# Patient Record
Sex: Male | Born: 1945
Health system: Southern US, Community
[De-identification: ages and names within clinical notes are randomized; demographics above are authoritative.]

## PROBLEM LIST (undated history)

## (undated) DIAGNOSIS — M545 Low back pain: Secondary | ICD-10-CM

## (undated) DIAGNOSIS — E785 Hyperlipidemia, unspecified: Secondary | ICD-10-CM

## (undated) DIAGNOSIS — I5030 Unspecified diastolic (congestive) heart failure: Secondary | ICD-10-CM

## (undated) DIAGNOSIS — G2581 Restless legs syndrome: Secondary | ICD-10-CM

## (undated) DIAGNOSIS — I4819 Other persistent atrial fibrillation: Secondary | ICD-10-CM

## (undated) DIAGNOSIS — I10 Essential (primary) hypertension: Secondary | ICD-10-CM

## (undated) DIAGNOSIS — H02839 Dermatochalasis of unspecified eye, unspecified eyelid: Secondary | ICD-10-CM

## (undated) DIAGNOSIS — E119 Type 2 diabetes mellitus without complications: Secondary | ICD-10-CM

## (undated) DIAGNOSIS — N182 Chronic kidney disease, stage 2 (mild): Secondary | ICD-10-CM

## (undated) DIAGNOSIS — M719 Bursopathy, unspecified: Secondary | ICD-10-CM

## (undated) DIAGNOSIS — G4730 Sleep apnea, unspecified: Secondary | ICD-10-CM

## (undated) DIAGNOSIS — M67919 Unspecified disorder of synovium and tendon, unspecified shoulder: Secondary | ICD-10-CM

## (undated) DIAGNOSIS — I119 Hypertensive heart disease without heart failure: Secondary | ICD-10-CM

## (undated) DIAGNOSIS — H524 Presbyopia: Secondary | ICD-10-CM

## (undated) DIAGNOSIS — M47817 Spondylosis without myelopathy or radiculopathy, lumbosacral region: Secondary | ICD-10-CM

## (undated) DIAGNOSIS — R601 Generalized edema: Secondary | ICD-10-CM

## (undated) DIAGNOSIS — J984 Other disorders of lung: Secondary | ICD-10-CM

## (undated) DIAGNOSIS — M961 Postlaminectomy syndrome, not elsewhere classified: Secondary | ICD-10-CM

## (undated) DIAGNOSIS — G5700 Lesion of sciatic nerve, unspecified lower limb: Secondary | ICD-10-CM

## (undated) DIAGNOSIS — Z7901 Long term (current) use of anticoagulants: Secondary | ICD-10-CM

## (undated) DIAGNOSIS — G4733 Obstructive sleep apnea (adult) (pediatric): Secondary | ICD-10-CM

## (undated) DIAGNOSIS — J449 Chronic obstructive pulmonary disease, unspecified: Secondary | ICD-10-CM

## (undated) HISTORY — DX: Other disorders of lung: J98.4

## (undated) HISTORY — DX: Presbyopia: H52.4

## (undated) HISTORY — DX: Postlaminectomy syndrome, not elsewhere classified: M96.1

## (undated) HISTORY — DX: Type 2 diabetes mellitus without complications: E11.9

## (undated) HISTORY — PX: BACK SURGERY: SHX140

## (undated) HISTORY — PX: LUMBAR FUSION: SHX111

## (undated) HISTORY — PX: CATARACT EXTRACTION: SUR2

## (undated) HISTORY — PX: NECK SURGERY: SHX720

## (undated) HISTORY — DX: Long term (current) use of anticoagulants: Z79.01

## (undated) HISTORY — DX: Chronic obstructive pulmonary disease, unspecified: J44.9

## (undated) HISTORY — DX: Low back pain: M54.5

## (undated) HISTORY — PX: APPENDECTOMY: SHX54

## (undated) HISTORY — DX: Essential (primary) hypertension: I10

## (undated) HISTORY — DX: Dermatochalasis of unspecified eye, unspecified eyelid: H02.839

## (undated) HISTORY — DX: Unspecified disorder of synovium and tendon, unspecified shoulder: M67.919

## (undated) HISTORY — DX: Spondylosis without myelopathy or radiculopathy, lumbosacral region: M47.817

## (undated) HISTORY — DX: Other persistent atrial fibrillation: I48.19

## (undated) HISTORY — DX: Obstructive sleep apnea (adult) (pediatric): G47.33

## (undated) HISTORY — DX: Chronic kidney disease, stage 2 (mild): N18.2

## (undated) HISTORY — DX: Sleep apnea, unspecified: G47.30

## (undated) HISTORY — DX: Generalized edema: R60.1

## (undated) HISTORY — DX: Restless legs syndrome: G25.81

## (undated) HISTORY — DX: Hyperlipidemia, unspecified: E78.5

## (undated) HISTORY — DX: Bursopathy, unspecified: M71.9

## (undated) HISTORY — DX: Hypertensive heart disease without heart failure: I11.9

## (undated) HISTORY — DX: Lesion of sciatic nerve, unspecified lower limb: G57.00

---

## 2005-07-02 ENCOUNTER — Inpatient Hospital Stay (HOSPITAL_COMMUNITY): Admission: RE | Admit: 2005-07-02 | Discharge: 2005-07-06 | Payer: Self-pay | Admitting: Orthopaedic Surgery

## 2013-05-06 DIAGNOSIS — F329 Major depressive disorder, single episode, unspecified: Secondary | ICD-10-CM | POA: Insufficient documentation

## 2013-05-06 DIAGNOSIS — I10 Essential (primary) hypertension: Secondary | ICD-10-CM | POA: Insufficient documentation

## 2013-05-06 DIAGNOSIS — F32A Depression, unspecified: Secondary | ICD-10-CM | POA: Insufficient documentation

## 2013-05-06 DIAGNOSIS — G8929 Other chronic pain: Secondary | ICD-10-CM | POA: Insufficient documentation

## 2013-05-06 DIAGNOSIS — F419 Anxiety disorder, unspecified: Secondary | ICD-10-CM

## 2013-05-06 DIAGNOSIS — M47817 Spondylosis without myelopathy or radiculopathy, lumbosacral region: Secondary | ICD-10-CM | POA: Insufficient documentation

## 2013-05-06 DIAGNOSIS — G4730 Sleep apnea, unspecified: Secondary | ICD-10-CM | POA: Insufficient documentation

## 2013-05-06 DIAGNOSIS — M961 Postlaminectomy syndrome, not elsewhere classified: Secondary | ICD-10-CM | POA: Insufficient documentation

## 2013-05-06 HISTORY — DX: Spondylosis without myelopathy or radiculopathy, lumbosacral region: M47.817

## 2013-05-06 HISTORY — DX: Essential (primary) hypertension: I10

## 2013-05-06 HISTORY — DX: Postlaminectomy syndrome, not elsewhere classified: M96.1

## 2013-05-06 HISTORY — DX: Sleep apnea, unspecified: G47.30

## 2013-05-06 HISTORY — DX: Anxiety disorder, unspecified: F41.9

## 2013-05-06 HISTORY — DX: Depression, unspecified: F32.A

## 2013-08-17 DIAGNOSIS — M545 Low back pain, unspecified: Secondary | ICD-10-CM | POA: Insufficient documentation

## 2013-08-17 HISTORY — DX: Low back pain, unspecified: M54.50

## 2014-01-05 DIAGNOSIS — M67919 Unspecified disorder of synovium and tendon, unspecified shoulder: Secondary | ICD-10-CM | POA: Insufficient documentation

## 2014-01-05 DIAGNOSIS — M719 Bursopathy, unspecified: Secondary | ICD-10-CM

## 2014-01-05 HISTORY — DX: Unspecified disorder of synovium and tendon, unspecified shoulder: M67.919

## 2014-01-05 HISTORY — DX: Bursopathy, unspecified: M71.9

## 2014-05-03 DIAGNOSIS — G5700 Lesion of sciatic nerve, unspecified lower limb: Secondary | ICD-10-CM

## 2014-05-03 HISTORY — DX: Lesion of sciatic nerve, unspecified lower limb: G57.00

## 2014-12-12 DIAGNOSIS — Z961 Presence of intraocular lens: Secondary | ICD-10-CM | POA: Diagnosis not present

## 2014-12-12 DIAGNOSIS — E119 Type 2 diabetes mellitus without complications: Secondary | ICD-10-CM | POA: Diagnosis not present

## 2014-12-12 DIAGNOSIS — H524 Presbyopia: Secondary | ICD-10-CM | POA: Diagnosis not present

## 2015-01-01 DIAGNOSIS — M961 Postlaminectomy syndrome, not elsewhere classified: Secondary | ICD-10-CM | POA: Diagnosis not present

## 2015-01-01 DIAGNOSIS — G894 Chronic pain syndrome: Secondary | ICD-10-CM | POA: Diagnosis not present

## 2015-01-01 DIAGNOSIS — M47817 Spondylosis without myelopathy or radiculopathy, lumbosacral region: Secondary | ICD-10-CM | POA: Diagnosis not present

## 2015-01-01 DIAGNOSIS — M545 Low back pain: Secondary | ICD-10-CM | POA: Diagnosis not present

## 2015-01-29 DIAGNOSIS — J441 Chronic obstructive pulmonary disease with (acute) exacerbation: Secondary | ICD-10-CM | POA: Diagnosis not present

## 2015-01-29 DIAGNOSIS — I10 Essential (primary) hypertension: Secondary | ICD-10-CM | POA: Diagnosis not present

## 2015-01-29 DIAGNOSIS — E1142 Type 2 diabetes mellitus with diabetic polyneuropathy: Secondary | ICD-10-CM | POA: Diagnosis not present

## 2015-01-29 DIAGNOSIS — E782 Mixed hyperlipidemia: Secondary | ICD-10-CM | POA: Diagnosis not present

## 2015-01-30 DIAGNOSIS — E780 Pure hypercholesterolemia: Secondary | ICD-10-CM | POA: Diagnosis not present

## 2015-01-30 DIAGNOSIS — E119 Type 2 diabetes mellitus without complications: Secondary | ICD-10-CM | POA: Diagnosis not present

## 2015-02-28 DIAGNOSIS — M961 Postlaminectomy syndrome, not elsewhere classified: Secondary | ICD-10-CM | POA: Diagnosis not present

## 2015-02-28 DIAGNOSIS — M47817 Spondylosis without myelopathy or radiculopathy, lumbosacral region: Secondary | ICD-10-CM | POA: Diagnosis not present

## 2015-02-28 DIAGNOSIS — M545 Low back pain: Secondary | ICD-10-CM | POA: Diagnosis not present

## 2015-02-28 DIAGNOSIS — G894 Chronic pain syndrome: Secondary | ICD-10-CM | POA: Diagnosis not present

## 2015-04-25 DIAGNOSIS — J0100 Acute maxillary sinusitis, unspecified: Secondary | ICD-10-CM | POA: Diagnosis not present

## 2015-05-09 DIAGNOSIS — E782 Mixed hyperlipidemia: Secondary | ICD-10-CM | POA: Diagnosis not present

## 2015-05-09 DIAGNOSIS — R202 Paresthesia of skin: Secondary | ICD-10-CM | POA: Diagnosis not present

## 2015-05-09 DIAGNOSIS — E1142 Type 2 diabetes mellitus with diabetic polyneuropathy: Secondary | ICD-10-CM | POA: Diagnosis not present

## 2015-05-09 DIAGNOSIS — E119 Type 2 diabetes mellitus without complications: Secondary | ICD-10-CM | POA: Diagnosis not present

## 2015-05-09 DIAGNOSIS — J449 Chronic obstructive pulmonary disease, unspecified: Secondary | ICD-10-CM | POA: Diagnosis not present

## 2015-05-09 DIAGNOSIS — E780 Pure hypercholesterolemia: Secondary | ICD-10-CM | POA: Diagnosis not present

## 2015-05-09 DIAGNOSIS — I10 Essential (primary) hypertension: Secondary | ICD-10-CM | POA: Diagnosis not present

## 2015-05-29 DIAGNOSIS — M47817 Spondylosis without myelopathy or radiculopathy, lumbosacral region: Secondary | ICD-10-CM | POA: Diagnosis not present

## 2015-05-29 DIAGNOSIS — M67919 Unspecified disorder of synovium and tendon, unspecified shoulder: Secondary | ICD-10-CM | POA: Diagnosis not present

## 2015-05-29 DIAGNOSIS — G894 Chronic pain syndrome: Secondary | ICD-10-CM | POA: Diagnosis not present

## 2015-05-29 DIAGNOSIS — M545 Low back pain: Secondary | ICD-10-CM | POA: Diagnosis not present

## 2015-05-29 DIAGNOSIS — M7582 Other shoulder lesions, left shoulder: Secondary | ICD-10-CM | POA: Diagnosis not present

## 2015-05-29 DIAGNOSIS — M719 Bursopathy, unspecified: Secondary | ICD-10-CM | POA: Diagnosis not present

## 2015-05-29 DIAGNOSIS — M961 Postlaminectomy syndrome, not elsewhere classified: Secondary | ICD-10-CM | POA: Diagnosis not present

## 2015-07-08 DIAGNOSIS — J449 Chronic obstructive pulmonary disease, unspecified: Secondary | ICD-10-CM | POA: Diagnosis not present

## 2015-07-08 DIAGNOSIS — L03119 Cellulitis of unspecified part of limb: Secondary | ICD-10-CM | POA: Diagnosis not present

## 2015-07-10 DIAGNOSIS — L03119 Cellulitis of unspecified part of limb: Secondary | ICD-10-CM | POA: Diagnosis not present

## 2015-07-10 DIAGNOSIS — L03114 Cellulitis of left upper limb: Secondary | ICD-10-CM | POA: Diagnosis not present

## 2015-07-11 DIAGNOSIS — L03114 Cellulitis of left upper limb: Secondary | ICD-10-CM | POA: Diagnosis not present

## 2015-07-12 DIAGNOSIS — L03114 Cellulitis of left upper limb: Secondary | ICD-10-CM | POA: Diagnosis not present

## 2015-07-13 DIAGNOSIS — L03114 Cellulitis of left upper limb: Secondary | ICD-10-CM | POA: Diagnosis not present

## 2015-07-14 DIAGNOSIS — L03114 Cellulitis of left upper limb: Secondary | ICD-10-CM | POA: Diagnosis not present

## 2015-07-15 DIAGNOSIS — L03114 Cellulitis of left upper limb: Secondary | ICD-10-CM | POA: Diagnosis not present

## 2015-07-16 DIAGNOSIS — L03114 Cellulitis of left upper limb: Secondary | ICD-10-CM | POA: Diagnosis not present

## 2015-07-22 DIAGNOSIS — J019 Acute sinusitis, unspecified: Secondary | ICD-10-CM | POA: Diagnosis not present

## 2015-07-22 DIAGNOSIS — J45909 Unspecified asthma, uncomplicated: Secondary | ICD-10-CM | POA: Diagnosis not present

## 2015-08-28 DIAGNOSIS — M719 Bursopathy, unspecified: Secondary | ICD-10-CM | POA: Diagnosis not present

## 2015-08-28 DIAGNOSIS — M47817 Spondylosis without myelopathy or radiculopathy, lumbosacral region: Secondary | ICD-10-CM | POA: Diagnosis not present

## 2015-08-28 DIAGNOSIS — G894 Chronic pain syndrome: Secondary | ICD-10-CM | POA: Diagnosis not present

## 2015-08-28 DIAGNOSIS — M961 Postlaminectomy syndrome, not elsewhere classified: Secondary | ICD-10-CM | POA: Diagnosis not present

## 2015-08-29 DIAGNOSIS — J449 Chronic obstructive pulmonary disease, unspecified: Secondary | ICD-10-CM | POA: Diagnosis not present

## 2015-08-29 DIAGNOSIS — Z6841 Body Mass Index (BMI) 40.0 and over, adult: Secondary | ICD-10-CM | POA: Diagnosis not present

## 2015-08-29 DIAGNOSIS — E1142 Type 2 diabetes mellitus with diabetic polyneuropathy: Secondary | ICD-10-CM | POA: Diagnosis not present

## 2015-08-29 DIAGNOSIS — J069 Acute upper respiratory infection, unspecified: Secondary | ICD-10-CM | POA: Diagnosis not present

## 2015-08-29 DIAGNOSIS — I10 Essential (primary) hypertension: Secondary | ICD-10-CM | POA: Diagnosis not present

## 2015-08-29 DIAGNOSIS — Z23 Encounter for immunization: Secondary | ICD-10-CM | POA: Diagnosis not present

## 2015-08-29 DIAGNOSIS — E782 Mixed hyperlipidemia: Secondary | ICD-10-CM | POA: Diagnosis not present

## 2015-09-20 DIAGNOSIS — H6002 Abscess of left external ear: Secondary | ICD-10-CM | POA: Diagnosis not present

## 2015-10-01 DIAGNOSIS — R944 Abnormal results of kidney function studies: Secondary | ICD-10-CM | POA: Diagnosis not present

## 2015-10-09 DIAGNOSIS — I10 Essential (primary) hypertension: Secondary | ICD-10-CM | POA: Diagnosis not present

## 2015-10-09 DIAGNOSIS — R5383 Other fatigue: Secondary | ICD-10-CM | POA: Diagnosis not present

## 2015-10-10 DIAGNOSIS — J449 Chronic obstructive pulmonary disease, unspecified: Secondary | ICD-10-CM | POA: Diagnosis not present

## 2015-10-10 DIAGNOSIS — R0602 Shortness of breath: Secondary | ICD-10-CM | POA: Diagnosis not present

## 2015-10-10 DIAGNOSIS — J9811 Atelectasis: Secondary | ICD-10-CM | POA: Diagnosis not present

## 2015-10-23 DIAGNOSIS — R944 Abnormal results of kidney function studies: Secondary | ICD-10-CM | POA: Diagnosis not present

## 2015-10-28 DIAGNOSIS — R600 Localized edema: Secondary | ICD-10-CM | POA: Diagnosis not present

## 2015-10-28 DIAGNOSIS — I351 Nonrheumatic aortic (valve) insufficiency: Secondary | ICD-10-CM | POA: Diagnosis not present

## 2015-10-28 DIAGNOSIS — I517 Cardiomegaly: Secondary | ICD-10-CM | POA: Diagnosis not present

## 2015-10-30 DIAGNOSIS — I10 Essential (primary) hypertension: Secondary | ICD-10-CM | POA: Diagnosis not present

## 2015-10-30 DIAGNOSIS — J060 Acute laryngopharyngitis: Secondary | ICD-10-CM | POA: Diagnosis not present

## 2015-10-30 DIAGNOSIS — R600 Localized edema: Secondary | ICD-10-CM | POA: Diagnosis not present

## 2015-11-04 DIAGNOSIS — R609 Edema, unspecified: Secondary | ICD-10-CM | POA: Insufficient documentation

## 2015-11-04 HISTORY — DX: Edema, unspecified: R60.9

## 2015-11-05 DIAGNOSIS — J449 Chronic obstructive pulmonary disease, unspecified: Secondary | ICD-10-CM | POA: Insufficient documentation

## 2015-11-05 DIAGNOSIS — J441 Chronic obstructive pulmonary disease with (acute) exacerbation: Secondary | ICD-10-CM | POA: Insufficient documentation

## 2015-11-05 DIAGNOSIS — G4730 Sleep apnea, unspecified: Secondary | ICD-10-CM | POA: Diagnosis not present

## 2015-11-05 DIAGNOSIS — E119 Type 2 diabetes mellitus without complications: Secondary | ICD-10-CM | POA: Diagnosis not present

## 2015-11-05 DIAGNOSIS — E118 Type 2 diabetes mellitus with unspecified complications: Secondary | ICD-10-CM

## 2015-11-05 DIAGNOSIS — R0609 Other forms of dyspnea: Secondary | ICD-10-CM | POA: Diagnosis not present

## 2015-11-05 DIAGNOSIS — R609 Edema, unspecified: Secondary | ICD-10-CM | POA: Diagnosis not present

## 2015-11-05 DIAGNOSIS — I10 Essential (primary) hypertension: Secondary | ICD-10-CM | POA: Diagnosis not present

## 2015-11-05 HISTORY — DX: Chronic obstructive pulmonary disease, unspecified: J44.9

## 2015-11-05 HISTORY — DX: Type 2 diabetes mellitus without complications: E11.9

## 2015-11-05 HISTORY — DX: Type 2 diabetes mellitus with unspecified complications: E11.8

## 2015-11-27 DIAGNOSIS — R609 Edema, unspecified: Secondary | ICD-10-CM | POA: Diagnosis not present

## 2015-11-28 DIAGNOSIS — M961 Postlaminectomy syndrome, not elsewhere classified: Secondary | ICD-10-CM | POA: Diagnosis not present

## 2015-11-28 DIAGNOSIS — M47817 Spondylosis without myelopathy or radiculopathy, lumbosacral region: Secondary | ICD-10-CM | POA: Diagnosis not present

## 2015-11-28 DIAGNOSIS — G894 Chronic pain syndrome: Secondary | ICD-10-CM | POA: Diagnosis not present

## 2015-12-04 DIAGNOSIS — D649 Anemia, unspecified: Secondary | ICD-10-CM | POA: Diagnosis not present

## 2015-12-04 DIAGNOSIS — J0100 Acute maxillary sinusitis, unspecified: Secondary | ICD-10-CM | POA: Diagnosis not present

## 2015-12-04 DIAGNOSIS — E782 Mixed hyperlipidemia: Secondary | ICD-10-CM | POA: Diagnosis not present

## 2015-12-04 DIAGNOSIS — J209 Acute bronchitis, unspecified: Secondary | ICD-10-CM | POA: Diagnosis not present

## 2015-12-04 DIAGNOSIS — E1142 Type 2 diabetes mellitus with diabetic polyneuropathy: Secondary | ICD-10-CM | POA: Diagnosis not present

## 2015-12-04 DIAGNOSIS — I10 Essential (primary) hypertension: Secondary | ICD-10-CM | POA: Diagnosis not present

## 2015-12-04 DIAGNOSIS — Z6841 Body Mass Index (BMI) 40.0 and over, adult: Secondary | ICD-10-CM | POA: Diagnosis not present

## 2015-12-04 DIAGNOSIS — D500 Iron deficiency anemia secondary to blood loss (chronic): Secondary | ICD-10-CM | POA: Diagnosis not present

## 2015-12-04 DIAGNOSIS — J410 Simple chronic bronchitis: Secondary | ICD-10-CM | POA: Diagnosis not present

## 2015-12-16 DIAGNOSIS — E785 Hyperlipidemia, unspecified: Secondary | ICD-10-CM | POA: Insufficient documentation

## 2015-12-16 DIAGNOSIS — Z961 Presence of intraocular lens: Secondary | ICD-10-CM

## 2015-12-16 DIAGNOSIS — H524 Presbyopia: Secondary | ICD-10-CM

## 2015-12-16 DIAGNOSIS — H02839 Dermatochalasis of unspecified eye, unspecified eyelid: Secondary | ICD-10-CM | POA: Insufficient documentation

## 2015-12-16 HISTORY — DX: Presence of intraocular lens: Z96.1

## 2015-12-16 HISTORY — DX: Dermatochalasis of unspecified eye, unspecified eyelid: H02.839

## 2015-12-16 HISTORY — DX: Hyperlipidemia, unspecified: E78.5

## 2015-12-16 HISTORY — DX: Presbyopia: H52.4

## 2015-12-19 DIAGNOSIS — G4733 Obstructive sleep apnea (adult) (pediatric): Secondary | ICD-10-CM | POA: Diagnosis not present

## 2015-12-19 DIAGNOSIS — J45909 Unspecified asthma, uncomplicated: Secondary | ICD-10-CM | POA: Diagnosis not present

## 2015-12-19 DIAGNOSIS — R601 Generalized edema: Secondary | ICD-10-CM | POA: Diagnosis not present

## 2015-12-19 DIAGNOSIS — J449 Chronic obstructive pulmonary disease, unspecified: Secondary | ICD-10-CM | POA: Diagnosis not present

## 2015-12-27 DIAGNOSIS — J018 Other acute sinusitis: Secondary | ICD-10-CM | POA: Diagnosis not present

## 2015-12-27 DIAGNOSIS — I10 Essential (primary) hypertension: Secondary | ICD-10-CM | POA: Diagnosis not present

## 2015-12-27 DIAGNOSIS — J410 Simple chronic bronchitis: Secondary | ICD-10-CM | POA: Diagnosis not present

## 2016-01-08 DIAGNOSIS — G4761 Periodic limb movement disorder: Secondary | ICD-10-CM | POA: Diagnosis not present

## 2016-01-08 DIAGNOSIS — R0683 Snoring: Secondary | ICD-10-CM | POA: Diagnosis not present

## 2016-01-08 DIAGNOSIS — G4733 Obstructive sleep apnea (adult) (pediatric): Secondary | ICD-10-CM | POA: Diagnosis not present

## 2016-01-20 DIAGNOSIS — G4733 Obstructive sleep apnea (adult) (pediatric): Secondary | ICD-10-CM | POA: Diagnosis not present

## 2016-01-20 DIAGNOSIS — J449 Chronic obstructive pulmonary disease, unspecified: Secondary | ICD-10-CM | POA: Diagnosis not present

## 2016-01-20 DIAGNOSIS — J984 Other disorders of lung: Secondary | ICD-10-CM | POA: Diagnosis not present

## 2016-01-21 DIAGNOSIS — E611 Iron deficiency: Secondary | ICD-10-CM | POA: Diagnosis not present

## 2016-01-21 DIAGNOSIS — Z86010 Personal history of colonic polyps: Secondary | ICD-10-CM | POA: Diagnosis not present

## 2016-01-29 DIAGNOSIS — I10 Essential (primary) hypertension: Secondary | ICD-10-CM | POA: Diagnosis not present

## 2016-01-29 DIAGNOSIS — J060 Acute laryngopharyngitis: Secondary | ICD-10-CM | POA: Diagnosis not present

## 2016-01-31 DIAGNOSIS — G4733 Obstructive sleep apnea (adult) (pediatric): Secondary | ICD-10-CM | POA: Diagnosis not present

## 2016-02-14 DIAGNOSIS — Z86010 Personal history of colonic polyps: Secondary | ICD-10-CM | POA: Diagnosis not present

## 2016-02-14 DIAGNOSIS — Z1211 Encounter for screening for malignant neoplasm of colon: Secondary | ICD-10-CM | POA: Diagnosis not present

## 2016-02-14 DIAGNOSIS — Z09 Encounter for follow-up examination after completed treatment for conditions other than malignant neoplasm: Secondary | ICD-10-CM | POA: Diagnosis not present

## 2016-02-14 DIAGNOSIS — D509 Iron deficiency anemia, unspecified: Secondary | ICD-10-CM | POA: Diagnosis not present

## 2016-02-14 DIAGNOSIS — K641 Second degree hemorrhoids: Secondary | ICD-10-CM | POA: Diagnosis not present

## 2016-02-14 DIAGNOSIS — D124 Benign neoplasm of descending colon: Secondary | ICD-10-CM | POA: Diagnosis not present

## 2016-02-14 DIAGNOSIS — K5730 Diverticulosis of large intestine without perforation or abscess without bleeding: Secondary | ICD-10-CM | POA: Diagnosis not present

## 2016-02-14 DIAGNOSIS — E611 Iron deficiency: Secondary | ICD-10-CM | POA: Diagnosis not present

## 2016-02-20 DIAGNOSIS — G894 Chronic pain syndrome: Secondary | ICD-10-CM | POA: Diagnosis not present

## 2016-02-20 DIAGNOSIS — M67919 Unspecified disorder of synovium and tendon, unspecified shoulder: Secondary | ICD-10-CM | POA: Diagnosis not present

## 2016-02-20 DIAGNOSIS — M719 Bursopathy, unspecified: Secondary | ICD-10-CM | POA: Diagnosis not present

## 2016-02-20 DIAGNOSIS — M47817 Spondylosis without myelopathy or radiculopathy, lumbosacral region: Secondary | ICD-10-CM | POA: Diagnosis not present

## 2016-02-20 DIAGNOSIS — M961 Postlaminectomy syndrome, not elsewhere classified: Secondary | ICD-10-CM | POA: Diagnosis not present

## 2016-03-02 DIAGNOSIS — G4733 Obstructive sleep apnea (adult) (pediatric): Secondary | ICD-10-CM | POA: Diagnosis not present

## 2016-03-04 DIAGNOSIS — G4733 Obstructive sleep apnea (adult) (pediatric): Secondary | ICD-10-CM | POA: Diagnosis not present

## 2016-03-04 DIAGNOSIS — I10 Essential (primary) hypertension: Secondary | ICD-10-CM | POA: Diagnosis not present

## 2016-03-04 DIAGNOSIS — Z6841 Body Mass Index (BMI) 40.0 and over, adult: Secondary | ICD-10-CM | POA: Diagnosis not present

## 2016-03-04 DIAGNOSIS — E782 Mixed hyperlipidemia: Secondary | ICD-10-CM | POA: Diagnosis not present

## 2016-03-04 DIAGNOSIS — E1142 Type 2 diabetes mellitus with diabetic polyneuropathy: Secondary | ICD-10-CM | POA: Diagnosis not present

## 2016-03-04 DIAGNOSIS — J410 Simple chronic bronchitis: Secondary | ICD-10-CM | POA: Diagnosis not present

## 2016-03-23 DIAGNOSIS — J449 Chronic obstructive pulmonary disease, unspecified: Secondary | ICD-10-CM | POA: Diagnosis not present

## 2016-03-23 DIAGNOSIS — G2581 Restless legs syndrome: Secondary | ICD-10-CM | POA: Diagnosis not present

## 2016-03-23 DIAGNOSIS — I10 Essential (primary) hypertension: Secondary | ICD-10-CM | POA: Diagnosis not present

## 2016-03-23 DIAGNOSIS — J309 Allergic rhinitis, unspecified: Secondary | ICD-10-CM | POA: Diagnosis not present

## 2016-03-23 DIAGNOSIS — G4733 Obstructive sleep apnea (adult) (pediatric): Secondary | ICD-10-CM | POA: Diagnosis not present

## 2016-03-23 DIAGNOSIS — J45909 Unspecified asthma, uncomplicated: Secondary | ICD-10-CM | POA: Diagnosis not present

## 2016-03-23 HISTORY — DX: Restless legs syndrome: G25.81

## 2016-04-01 DIAGNOSIS — G4733 Obstructive sleep apnea (adult) (pediatric): Secondary | ICD-10-CM | POA: Diagnosis not present

## 2016-04-01 DIAGNOSIS — L988 Other specified disorders of the skin and subcutaneous tissue: Secondary | ICD-10-CM | POA: Diagnosis not present

## 2016-04-01 DIAGNOSIS — H60312 Diffuse otitis externa, left ear: Secondary | ICD-10-CM | POA: Diagnosis not present

## 2016-04-01 DIAGNOSIS — R601 Generalized edema: Secondary | ICD-10-CM | POA: Diagnosis not present

## 2016-04-01 DIAGNOSIS — J45909 Unspecified asthma, uncomplicated: Secondary | ICD-10-CM | POA: Diagnosis not present

## 2016-04-01 DIAGNOSIS — J449 Chronic obstructive pulmonary disease, unspecified: Secondary | ICD-10-CM | POA: Diagnosis not present

## 2016-05-02 DIAGNOSIS — G4733 Obstructive sleep apnea (adult) (pediatric): Secondary | ICD-10-CM | POA: Diagnosis not present

## 2016-05-02 DIAGNOSIS — J45909 Unspecified asthma, uncomplicated: Secondary | ICD-10-CM | POA: Diagnosis not present

## 2016-05-02 DIAGNOSIS — R601 Generalized edema: Secondary | ICD-10-CM | POA: Diagnosis not present

## 2016-05-02 DIAGNOSIS — J449 Chronic obstructive pulmonary disease, unspecified: Secondary | ICD-10-CM | POA: Diagnosis not present

## 2016-05-14 DIAGNOSIS — J060 Acute laryngopharyngitis: Secondary | ICD-10-CM | POA: Diagnosis not present

## 2016-05-26 DIAGNOSIS — M961 Postlaminectomy syndrome, not elsewhere classified: Secondary | ICD-10-CM | POA: Diagnosis not present

## 2016-05-26 DIAGNOSIS — G894 Chronic pain syndrome: Secondary | ICD-10-CM | POA: Diagnosis not present

## 2016-05-26 DIAGNOSIS — M47817 Spondylosis without myelopathy or radiculopathy, lumbosacral region: Secondary | ICD-10-CM | POA: Diagnosis not present

## 2016-06-01 DIAGNOSIS — J45909 Unspecified asthma, uncomplicated: Secondary | ICD-10-CM | POA: Diagnosis not present

## 2016-06-01 DIAGNOSIS — G4733 Obstructive sleep apnea (adult) (pediatric): Secondary | ICD-10-CM | POA: Diagnosis not present

## 2016-06-01 DIAGNOSIS — R601 Generalized edema: Secondary | ICD-10-CM | POA: Diagnosis not present

## 2016-06-01 DIAGNOSIS — J449 Chronic obstructive pulmonary disease, unspecified: Secondary | ICD-10-CM | POA: Diagnosis not present

## 2016-06-04 DIAGNOSIS — H6122 Impacted cerumen, left ear: Secondary | ICD-10-CM | POA: Diagnosis not present

## 2016-06-04 DIAGNOSIS — J441 Chronic obstructive pulmonary disease with (acute) exacerbation: Secondary | ICD-10-CM | POA: Diagnosis not present

## 2016-06-08 DIAGNOSIS — G4733 Obstructive sleep apnea (adult) (pediatric): Secondary | ICD-10-CM | POA: Diagnosis not present

## 2016-06-08 DIAGNOSIS — J0100 Acute maxillary sinusitis, unspecified: Secondary | ICD-10-CM | POA: Diagnosis not present

## 2016-06-08 DIAGNOSIS — E1142 Type 2 diabetes mellitus with diabetic polyneuropathy: Secondary | ICD-10-CM | POA: Diagnosis not present

## 2016-06-08 DIAGNOSIS — E782 Mixed hyperlipidemia: Secondary | ICD-10-CM | POA: Diagnosis not present

## 2016-06-08 DIAGNOSIS — J410 Simple chronic bronchitis: Secondary | ICD-10-CM | POA: Diagnosis not present

## 2016-06-08 DIAGNOSIS — Z6841 Body Mass Index (BMI) 40.0 and over, adult: Secondary | ICD-10-CM | POA: Diagnosis not present

## 2016-06-08 DIAGNOSIS — I10 Essential (primary) hypertension: Secondary | ICD-10-CM | POA: Diagnosis not present

## 2016-06-08 DIAGNOSIS — H6122 Impacted cerumen, left ear: Secondary | ICD-10-CM | POA: Diagnosis not present

## 2016-06-22 DIAGNOSIS — G2581 Restless legs syndrome: Secondary | ICD-10-CM | POA: Diagnosis not present

## 2016-06-22 DIAGNOSIS — G4733 Obstructive sleep apnea (adult) (pediatric): Secondary | ICD-10-CM | POA: Diagnosis not present

## 2016-06-22 DIAGNOSIS — J984 Other disorders of lung: Secondary | ICD-10-CM

## 2016-06-22 DIAGNOSIS — R601 Generalized edema: Secondary | ICD-10-CM

## 2016-06-22 DIAGNOSIS — J449 Chronic obstructive pulmonary disease, unspecified: Secondary | ICD-10-CM | POA: Diagnosis not present

## 2016-06-22 DIAGNOSIS — J45909 Unspecified asthma, uncomplicated: Secondary | ICD-10-CM | POA: Diagnosis not present

## 2016-06-22 HISTORY — DX: Other disorders of lung: J98.4

## 2016-06-22 HISTORY — DX: Generalized edema: R60.1

## 2016-07-02 DIAGNOSIS — R601 Generalized edema: Secondary | ICD-10-CM | POA: Diagnosis not present

## 2016-07-02 DIAGNOSIS — G4733 Obstructive sleep apnea (adult) (pediatric): Secondary | ICD-10-CM | POA: Diagnosis not present

## 2016-07-02 DIAGNOSIS — J449 Chronic obstructive pulmonary disease, unspecified: Secondary | ICD-10-CM | POA: Diagnosis not present

## 2016-07-02 DIAGNOSIS — J45909 Unspecified asthma, uncomplicated: Secondary | ICD-10-CM | POA: Diagnosis not present

## 2016-07-06 DIAGNOSIS — J449 Chronic obstructive pulmonary disease, unspecified: Secondary | ICD-10-CM | POA: Diagnosis not present

## 2016-07-06 DIAGNOSIS — J45909 Unspecified asthma, uncomplicated: Secondary | ICD-10-CM | POA: Diagnosis not present

## 2016-07-06 DIAGNOSIS — G4733 Obstructive sleep apnea (adult) (pediatric): Secondary | ICD-10-CM | POA: Diagnosis not present

## 2016-07-06 DIAGNOSIS — R601 Generalized edema: Secondary | ICD-10-CM | POA: Diagnosis not present

## 2016-07-14 DIAGNOSIS — G4730 Sleep apnea, unspecified: Secondary | ICD-10-CM | POA: Diagnosis not present

## 2016-07-14 DIAGNOSIS — R609 Edema, unspecified: Secondary | ICD-10-CM | POA: Diagnosis not present

## 2016-07-14 DIAGNOSIS — B9562 Methicillin resistant Staphylococcus aureus infection as the cause of diseases classified elsewhere: Secondary | ICD-10-CM | POA: Diagnosis not present

## 2016-07-14 DIAGNOSIS — I10 Essential (primary) hypertension: Secondary | ICD-10-CM | POA: Diagnosis not present

## 2016-07-14 DIAGNOSIS — L0390 Cellulitis, unspecified: Secondary | ICD-10-CM | POA: Diagnosis not present

## 2016-07-15 DIAGNOSIS — I10 Essential (primary) hypertension: Secondary | ICD-10-CM | POA: Diagnosis not present

## 2016-07-15 DIAGNOSIS — L03113 Cellulitis of right upper limb: Secondary | ICD-10-CM | POA: Diagnosis not present

## 2016-07-15 DIAGNOSIS — H6122 Impacted cerumen, left ear: Secondary | ICD-10-CM | POA: Diagnosis not present

## 2016-07-15 DIAGNOSIS — Z23 Encounter for immunization: Secondary | ICD-10-CM | POA: Diagnosis not present

## 2016-07-15 DIAGNOSIS — J018 Other acute sinusitis: Secondary | ICD-10-CM | POA: Diagnosis not present

## 2016-08-02 DIAGNOSIS — J45909 Unspecified asthma, uncomplicated: Secondary | ICD-10-CM | POA: Diagnosis not present

## 2016-08-02 DIAGNOSIS — R601 Generalized edema: Secondary | ICD-10-CM | POA: Diagnosis not present

## 2016-08-02 DIAGNOSIS — J449 Chronic obstructive pulmonary disease, unspecified: Secondary | ICD-10-CM | POA: Diagnosis not present

## 2016-08-02 DIAGNOSIS — G4733 Obstructive sleep apnea (adult) (pediatric): Secondary | ICD-10-CM | POA: Diagnosis not present

## 2016-08-11 DIAGNOSIS — J449 Chronic obstructive pulmonary disease, unspecified: Secondary | ICD-10-CM | POA: Diagnosis not present

## 2016-08-11 DIAGNOSIS — R601 Generalized edema: Secondary | ICD-10-CM | POA: Diagnosis not present

## 2016-08-11 DIAGNOSIS — J45909 Unspecified asthma, uncomplicated: Secondary | ICD-10-CM | POA: Diagnosis not present

## 2016-08-11 DIAGNOSIS — G4733 Obstructive sleep apnea (adult) (pediatric): Secondary | ICD-10-CM | POA: Diagnosis not present

## 2016-08-19 DIAGNOSIS — M719 Bursopathy, unspecified: Secondary | ICD-10-CM | POA: Diagnosis not present

## 2016-08-19 DIAGNOSIS — M47817 Spondylosis without myelopathy or radiculopathy, lumbosacral region: Secondary | ICD-10-CM | POA: Diagnosis not present

## 2016-08-19 DIAGNOSIS — G894 Chronic pain syndrome: Secondary | ICD-10-CM | POA: Diagnosis not present

## 2016-08-19 DIAGNOSIS — M67919 Unspecified disorder of synovium and tendon, unspecified shoulder: Secondary | ICD-10-CM | POA: Diagnosis not present

## 2016-08-19 DIAGNOSIS — M961 Postlaminectomy syndrome, not elsewhere classified: Secondary | ICD-10-CM | POA: Diagnosis not present

## 2016-09-01 DIAGNOSIS — R0602 Shortness of breath: Secondary | ICD-10-CM | POA: Diagnosis not present

## 2016-09-01 DIAGNOSIS — R0789 Other chest pain: Secondary | ICD-10-CM | POA: Diagnosis not present

## 2016-09-01 DIAGNOSIS — J018 Other acute sinusitis: Secondary | ICD-10-CM | POA: Diagnosis not present

## 2016-09-01 DIAGNOSIS — J449 Chronic obstructive pulmonary disease, unspecified: Secondary | ICD-10-CM | POA: Diagnosis not present

## 2016-09-01 DIAGNOSIS — J441 Chronic obstructive pulmonary disease with (acute) exacerbation: Secondary | ICD-10-CM | POA: Diagnosis not present

## 2016-09-01 DIAGNOSIS — J45909 Unspecified asthma, uncomplicated: Secondary | ICD-10-CM | POA: Diagnosis not present

## 2016-09-01 DIAGNOSIS — G4733 Obstructive sleep apnea (adult) (pediatric): Secondary | ICD-10-CM | POA: Diagnosis not present

## 2016-09-01 DIAGNOSIS — L0212 Furuncle of neck: Secondary | ICD-10-CM | POA: Diagnosis not present

## 2016-09-01 DIAGNOSIS — R601 Generalized edema: Secondary | ICD-10-CM | POA: Diagnosis not present

## 2016-09-02 DIAGNOSIS — J018 Other acute sinusitis: Secondary | ICD-10-CM | POA: Diagnosis not present

## 2016-09-02 DIAGNOSIS — R600 Localized edema: Secondary | ICD-10-CM | POA: Diagnosis not present

## 2016-09-02 DIAGNOSIS — I10 Essential (primary) hypertension: Secondary | ICD-10-CM | POA: Diagnosis not present

## 2016-09-02 DIAGNOSIS — L0212 Furuncle of neck: Secondary | ICD-10-CM | POA: Diagnosis not present

## 2016-09-02 DIAGNOSIS — R0789 Other chest pain: Secondary | ICD-10-CM | POA: Diagnosis not present

## 2016-09-22 DIAGNOSIS — Z125 Encounter for screening for malignant neoplasm of prostate: Secondary | ICD-10-CM | POA: Diagnosis not present

## 2016-09-22 DIAGNOSIS — I10 Essential (primary) hypertension: Secondary | ICD-10-CM | POA: Diagnosis not present

## 2016-09-22 DIAGNOSIS — E1142 Type 2 diabetes mellitus with diabetic polyneuropathy: Secondary | ICD-10-CM | POA: Diagnosis not present

## 2016-09-22 DIAGNOSIS — E782 Mixed hyperlipidemia: Secondary | ICD-10-CM | POA: Diagnosis not present

## 2016-09-23 DIAGNOSIS — Z0001 Encounter for general adult medical examination with abnormal findings: Secondary | ICD-10-CM | POA: Diagnosis not present

## 2016-09-23 DIAGNOSIS — Z6841 Body Mass Index (BMI) 40.0 and over, adult: Secondary | ICD-10-CM | POA: Diagnosis not present

## 2016-09-23 DIAGNOSIS — Z125 Encounter for screening for malignant neoplasm of prostate: Secondary | ICD-10-CM | POA: Diagnosis not present

## 2016-09-23 DIAGNOSIS — B356 Tinea cruris: Secondary | ICD-10-CM | POA: Diagnosis not present

## 2016-09-23 DIAGNOSIS — Z23 Encounter for immunization: Secondary | ICD-10-CM | POA: Diagnosis not present

## 2016-09-28 DIAGNOSIS — Z6841 Body Mass Index (BMI) 40.0 and over, adult: Secondary | ICD-10-CM | POA: Diagnosis not present

## 2016-09-28 DIAGNOSIS — I10 Essential (primary) hypertension: Secondary | ICD-10-CM | POA: Diagnosis not present

## 2016-09-28 DIAGNOSIS — F5101 Primary insomnia: Secondary | ICD-10-CM | POA: Diagnosis not present

## 2016-09-28 DIAGNOSIS — J410 Simple chronic bronchitis: Secondary | ICD-10-CM | POA: Diagnosis not present

## 2016-09-28 DIAGNOSIS — N400 Enlarged prostate without lower urinary tract symptoms: Secondary | ICD-10-CM | POA: Diagnosis not present

## 2016-09-28 DIAGNOSIS — E782 Mixed hyperlipidemia: Secondary | ICD-10-CM | POA: Diagnosis not present

## 2016-09-28 DIAGNOSIS — G4733 Obstructive sleep apnea (adult) (pediatric): Secondary | ICD-10-CM | POA: Diagnosis not present

## 2016-09-28 DIAGNOSIS — E1142 Type 2 diabetes mellitus with diabetic polyneuropathy: Secondary | ICD-10-CM | POA: Diagnosis not present

## 2016-10-02 DIAGNOSIS — G4733 Obstructive sleep apnea (adult) (pediatric): Secondary | ICD-10-CM | POA: Diagnosis not present

## 2016-10-02 DIAGNOSIS — J45909 Unspecified asthma, uncomplicated: Secondary | ICD-10-CM | POA: Diagnosis not present

## 2016-10-02 DIAGNOSIS — J449 Chronic obstructive pulmonary disease, unspecified: Secondary | ICD-10-CM | POA: Diagnosis not present

## 2016-10-02 DIAGNOSIS — R601 Generalized edema: Secondary | ICD-10-CM | POA: Diagnosis not present

## 2016-10-20 DIAGNOSIS — J060 Acute laryngopharyngitis: Secondary | ICD-10-CM | POA: Diagnosis not present

## 2016-11-01 DIAGNOSIS — G4733 Obstructive sleep apnea (adult) (pediatric): Secondary | ICD-10-CM | POA: Diagnosis not present

## 2016-11-01 DIAGNOSIS — R601 Generalized edema: Secondary | ICD-10-CM | POA: Diagnosis not present

## 2016-11-01 DIAGNOSIS — J449 Chronic obstructive pulmonary disease, unspecified: Secondary | ICD-10-CM | POA: Diagnosis not present

## 2016-11-01 DIAGNOSIS — J45909 Unspecified asthma, uncomplicated: Secondary | ICD-10-CM | POA: Diagnosis not present

## 2016-11-05 DIAGNOSIS — J441 Chronic obstructive pulmonary disease with (acute) exacerbation: Secondary | ICD-10-CM | POA: Diagnosis not present

## 2016-11-05 DIAGNOSIS — I10 Essential (primary) hypertension: Secondary | ICD-10-CM | POA: Diagnosis not present

## 2016-11-11 DIAGNOSIS — G4733 Obstructive sleep apnea (adult) (pediatric): Secondary | ICD-10-CM | POA: Diagnosis not present

## 2016-11-11 DIAGNOSIS — M719 Bursopathy, unspecified: Secondary | ICD-10-CM | POA: Diagnosis not present

## 2016-11-11 DIAGNOSIS — M47817 Spondylosis without myelopathy or radiculopathy, lumbosacral region: Secondary | ICD-10-CM | POA: Diagnosis not present

## 2016-11-11 DIAGNOSIS — J984 Other disorders of lung: Secondary | ICD-10-CM | POA: Diagnosis not present

## 2016-11-11 DIAGNOSIS — G894 Chronic pain syndrome: Secondary | ICD-10-CM | POA: Diagnosis not present

## 2016-11-11 DIAGNOSIS — J449 Chronic obstructive pulmonary disease, unspecified: Secondary | ICD-10-CM | POA: Diagnosis not present

## 2016-11-11 DIAGNOSIS — M961 Postlaminectomy syndrome, not elsewhere classified: Secondary | ICD-10-CM | POA: Diagnosis not present

## 2016-11-11 DIAGNOSIS — G2581 Restless legs syndrome: Secondary | ICD-10-CM | POA: Diagnosis not present

## 2016-11-11 DIAGNOSIS — M67919 Unspecified disorder of synovium and tendon, unspecified shoulder: Secondary | ICD-10-CM | POA: Diagnosis not present

## 2016-12-02 DIAGNOSIS — G4733 Obstructive sleep apnea (adult) (pediatric): Secondary | ICD-10-CM | POA: Diagnosis not present

## 2016-12-02 DIAGNOSIS — R601 Generalized edema: Secondary | ICD-10-CM | POA: Diagnosis not present

## 2016-12-02 DIAGNOSIS — J45909 Unspecified asthma, uncomplicated: Secondary | ICD-10-CM | POA: Diagnosis not present

## 2016-12-02 DIAGNOSIS — J060 Acute laryngopharyngitis: Secondary | ICD-10-CM | POA: Diagnosis not present

## 2016-12-02 DIAGNOSIS — J449 Chronic obstructive pulmonary disease, unspecified: Secondary | ICD-10-CM | POA: Diagnosis not present

## 2016-12-02 DIAGNOSIS — K1239 Other oral mucositis (ulcerative): Secondary | ICD-10-CM | POA: Diagnosis not present

## 2016-12-30 DIAGNOSIS — I10 Essential (primary) hypertension: Secondary | ICD-10-CM | POA: Diagnosis not present

## 2016-12-30 DIAGNOSIS — Z6841 Body Mass Index (BMI) 40.0 and over, adult: Secondary | ICD-10-CM | POA: Diagnosis not present

## 2016-12-30 DIAGNOSIS — N400 Enlarged prostate without lower urinary tract symptoms: Secondary | ICD-10-CM | POA: Diagnosis not present

## 2016-12-30 DIAGNOSIS — E782 Mixed hyperlipidemia: Secondary | ICD-10-CM | POA: Diagnosis not present

## 2016-12-30 DIAGNOSIS — J441 Chronic obstructive pulmonary disease with (acute) exacerbation: Secondary | ICD-10-CM | POA: Diagnosis not present

## 2016-12-30 DIAGNOSIS — G4733 Obstructive sleep apnea (adult) (pediatric): Secondary | ICD-10-CM | POA: Diagnosis not present

## 2016-12-30 DIAGNOSIS — E1142 Type 2 diabetes mellitus with diabetic polyneuropathy: Secondary | ICD-10-CM | POA: Diagnosis not present

## 2017-01-02 DIAGNOSIS — G4733 Obstructive sleep apnea (adult) (pediatric): Secondary | ICD-10-CM | POA: Diagnosis not present

## 2017-01-02 DIAGNOSIS — R601 Generalized edema: Secondary | ICD-10-CM | POA: Diagnosis not present

## 2017-01-02 DIAGNOSIS — J449 Chronic obstructive pulmonary disease, unspecified: Secondary | ICD-10-CM | POA: Diagnosis not present

## 2017-01-02 DIAGNOSIS — J45909 Unspecified asthma, uncomplicated: Secondary | ICD-10-CM | POA: Diagnosis not present

## 2017-02-02 DIAGNOSIS — J410 Simple chronic bronchitis: Secondary | ICD-10-CM | POA: Diagnosis not present

## 2017-02-02 DIAGNOSIS — I10 Essential (primary) hypertension: Secondary | ICD-10-CM | POA: Diagnosis not present

## 2017-03-03 DIAGNOSIS — G894 Chronic pain syndrome: Secondary | ICD-10-CM | POA: Diagnosis not present

## 2017-03-03 DIAGNOSIS — M47817 Spondylosis without myelopathy or radiculopathy, lumbosacral region: Secondary | ICD-10-CM | POA: Diagnosis not present

## 2017-03-03 DIAGNOSIS — M961 Postlaminectomy syndrome, not elsewhere classified: Secondary | ICD-10-CM | POA: Diagnosis not present

## 2017-03-12 DIAGNOSIS — J449 Chronic obstructive pulmonary disease, unspecified: Secondary | ICD-10-CM | POA: Diagnosis not present

## 2017-03-12 DIAGNOSIS — G4733 Obstructive sleep apnea (adult) (pediatric): Secondary | ICD-10-CM | POA: Diagnosis not present

## 2017-03-12 DIAGNOSIS — J984 Other disorders of lung: Secondary | ICD-10-CM | POA: Diagnosis not present

## 2017-03-25 DIAGNOSIS — J301 Allergic rhinitis due to pollen: Secondary | ICD-10-CM | POA: Diagnosis not present

## 2017-03-25 DIAGNOSIS — R0602 Shortness of breath: Secondary | ICD-10-CM | POA: Diagnosis not present

## 2017-03-25 DIAGNOSIS — R600 Localized edema: Secondary | ICD-10-CM | POA: Diagnosis not present

## 2017-04-09 ENCOUNTER — Other Ambulatory Visit: Payer: Self-pay

## 2017-04-09 DIAGNOSIS — Z6841 Body Mass Index (BMI) 40.0 and over, adult: Secondary | ICD-10-CM | POA: Diagnosis not present

## 2017-04-09 DIAGNOSIS — E782 Mixed hyperlipidemia: Secondary | ICD-10-CM | POA: Diagnosis not present

## 2017-04-09 DIAGNOSIS — I10 Essential (primary) hypertension: Secondary | ICD-10-CM | POA: Diagnosis not present

## 2017-04-09 DIAGNOSIS — G4733 Obstructive sleep apnea (adult) (pediatric): Secondary | ICD-10-CM | POA: Diagnosis not present

## 2017-04-09 DIAGNOSIS — J301 Allergic rhinitis due to pollen: Secondary | ICD-10-CM | POA: Diagnosis not present

## 2017-04-09 DIAGNOSIS — E1142 Type 2 diabetes mellitus with diabetic polyneuropathy: Secondary | ICD-10-CM | POA: Diagnosis not present

## 2017-04-09 NOTE — Patient Outreach (Signed)
[place] [facility]) Care Management  04/09/2017  Strider Vallance Lingerfelt 02-05-1946 [id number]   Telephone Screen  Referral Date: 04/09/17 Referral Source: MD office (Dr. Dirk Dress) Referral Reason: "HF,COPD, HTN" Insurance: [facility]   Outreach attempt # 1 to patient. Male answered the phone and stated that patient was not at home. RN CM left contact info for her to provide to patient and requested that patient call RN CM back.     Plan: RN CM will make outreach attempt to patient within three business days if no return call from patient.   Enzo Montgomery, RN,BSN,CCM [place] Management Telephonic Care Management Coordinator Direct Phone: [phone] Toll Free: [phone] Fax: [phone]

## 2017-04-13 ENCOUNTER — Other Ambulatory Visit: Payer: Self-pay

## 2017-04-13 NOTE — Patient Outreach (Signed)
[name]) Care Management  04/13/2017  Shawn Sullivan Jul 04, 1946 [id number]   Telephone Screen  Referral Date: 04/09/17 Referral Source: MD office (Dr. Dirk Dress) Referral Reason: "HF,COPD, HTN" Insurance: [employer]   Outreach attempt #2 to patient. Spoke with patient and screening completed.  Social; Patient resides in his home along with his spouse. He reports that he is independent with ADLs/IADLs. patient drives himself to medical appts. He states that he fell a few weeks ago at church and got a skin tear. patient states that he has cane and walker in the home but primarily uses cane to assist with walking.  Conditions: Patient has PMH of HF, COPD, DM,HTN, chronic back pain, obesity, OSA and benign prostatic hypertrophy per notes. Patient reports that he is taking inhalers and neb txs to manage his COPD. He voices that he does get SOB with exertion at times. Patient is monitoring his blood sugars in the home. He voice that cbgs normally range in the 100's. When patient questioned in regards to mgmt of HF. He states he is unsure rather or not he has HF. Patient able to verbalize that he could use further education and support in management of his chronic conditions.   Medications: Patient voices taking greater than 10 meds. He voices difficulty paying for COPD meds-especially Breo inhaler as well as DM meds. He is able to manage his meds with the assistance of his sister who helps him out occasionally.  Appointments: Patient is followed by PCP-Dr. Dirk Dress. Denies any upcoming appts.  Advance Directives: None. Declined info.  Consent: [name]. Patient gave verbal consent for [name] services for [place] and pharmacist.  Plan: RN CM will notify [facility] administrative assistant of case status. RN CM will send [place] referral for further disease education and support. RN CM will send [place] referral for polypharmacy med review and possible med assistance.  Enzo Montgomery, RN,BSN,CCM [place] Management Telephonic Care Management Coordinator Direct Phone: [phone] Toll Free: [phone] Fax: [phone]

## 2017-04-22 ENCOUNTER — Ambulatory Visit: Payer: Self-pay | Admitting: *Deleted

## 2017-04-23 ENCOUNTER — Other Ambulatory Visit: Payer: Self-pay | Admitting: Pharmacist

## 2017-05-03 NOTE — Patient Outreach (Signed)
[place] [facility]) Care Management  [place]   04/23/2017  Shawn Sullivan 09-28-1946 [id number]  Subjective: Patient is a 71 year old male referred for medication reconciliation and assistance by [employer].   Medication reconciliation was performed today over the phone with he and his wife. He knows what medications he takes and how he takes them, but said it was easier for me to talk with his wife. He did speak up to answer my questions in the background. Patient states that he doesn't miss any doses of medications, but that he will take them later in the day or at night sometimes.   Patient reports checking his blood sugar twice a day most days, with a reading of 130 this morning, and 96 yesterday morning. He reports no lows and did confirm how to treat if he was to have a low blood sugar. He is also checking his blood pressure most days and today it was 155/75.  Patient reports no adverse effects but does state that furosemide makes him go to the bathroom a lot and doesn't take down the swelling in his legs as well as torsemide did. He also mentioned that the pharmacy used to give him a red albuterol inhaler and gave him a blue one last time, but it doesn't work as well.  Patient states having difficulty affording his Breo Ellipta, albuterol inhaler, and Victoza.  Objective:   Encounter Medications: Outpatient Encounter Prescriptions as of 04/23/2017  Medication Sig  . aspirin (GOODSENSE ASPIRIN) 325 MG tablet Take 325 mg by mouth daily.  [name] atorvastatin (LIPITOR) 40 MG tablet Take 40 mg by mouth daily.  . carvedilol (COREG) 25 MG tablet Take 25 mg by mouth 2 (two) times daily with a meal.  . chlorthalidone (HYGROTON) 50 MG tablet Take 50 mg by mouth daily.  . Coenzyme Q10 (COQ10) 100 MG CAPS Take 100 mg by mouth daily.  . fluticasone furoate-vilanterol (BREO ELLIPTA) 100-25 MCG/INH AEPB Inhale 1 puff into the lungs daily.  . furosemide (LASIX) 20 MG  tablet Take 20 mg by mouth daily.  [name] glipiZIDE (GLUCOTROL) 10 MG tablet Take 10 mg by mouth 2 (two) times daily.  . hydrALAZINE (APRESOLINE) 50 MG tablet Take 50 mg by mouth 2 (two) times daily.  Derrill Memo ON 05/17/2017] HYDROcodone-acetaminophen (NORCO) 10-325 MG tablet Take 1 tablet by mouth every 4 (four) hours as needed.  . liraglutide (VICTOZA) 18 MG/3ML SOPN Inject 1.2 mg into the skin daily.  . metFORMIN (GLUCOPHAGE) 1000 MG tablet Take 1,000 mg by mouth 2 (two) times daily.  . montelukast (SINGULAIR) 10 MG tablet Take 10 mg by mouth at bedtime.  [name] omeprazole (PRILOSEC) 20 MG capsule Take 20 mg by mouth daily.  . sertraline (ZOLOFT) 50 MG tablet Take 50 mg by mouth 2 (two) times daily.  . tamsulosin (FLOMAX) 0.4 MG CAPS capsule Take 0.4 mg by mouth daily.   No facility-administered encounter medications on file as of 04/23/2017.     Functional Status: No flowsheet data found.  Fall/Depression Screening: No flowsheet data found. PHQ 2/9 Scores 04/13/2017  PHQ - 2 Score 0   Assessment: Drugs sorted by system:   Cardiovascular: aspirin, atorvastatin, carvedilol, furosemide, hydralazine, chlorthalidone Gastrointestinal: omeprazole Endocrine: glipizide, Victoza, metformin Pain: Norco Pulmonary: albuterol, Breo Ellipta, montelukast Vitamins/Minerals: Co-Q-10 Miscellaneous: sertraline, tamsulosin Duplications in therapy: None Gaps in therapy: None Medications to avoid in the elderly: None Drug interactions: None  Other issues noted:  -Patient having issues affording his medications  and has been getting samples -Patient is using his albuterol inhaler several times daily  Medication Assistance: [place] patient currently in the deductible phase that cannot afford his copays for Victoza and Breo are ~$200.  Per his pharmacy his 3 month supply of Victoza wis $225 with $84 going towards his deductible.  Patient is above low income subsidy/extra help income level and has not met  out of pocket spend for manufacturer patient assistance.  Patient has previously received samples but has not purchased Victoza or Breo Ellipta this year.   Plan: Discussed with patient he must pay the deductibe then his copay will go down.  Patient demonstrated understanding and believes he can afford them Reviewed patients medications with him and his wife over the phone. Patient denied any other issues.  Will followup with patient in 3-4 weeks then consider closing case at this time  Bennye Alm, PharmD, [place] PGY2 Pharmacy Resident [phone]

## 2017-05-17 ENCOUNTER — Other Ambulatory Visit: Payer: Self-pay | Admitting: *Deleted

## 2017-05-17 DIAGNOSIS — H60312 Diffuse otitis externa, left ear: Secondary | ICD-10-CM | POA: Diagnosis not present

## 2017-05-17 DIAGNOSIS — R42 Dizziness and giddiness: Secondary | ICD-10-CM | POA: Diagnosis not present

## 2017-05-17 DIAGNOSIS — D485 Neoplasm of uncertain behavior of skin: Secondary | ICD-10-CM | POA: Diagnosis not present

## 2017-05-17 NOTE — Patient Outreach (Signed)
[place] [facility]) Care Management  05/17/2017  Shawn Sullivan 1946-04-15 [id number]   RN Health Coach attempted #1  Follow up outreach call to patient.  Patient was unavailable. HIPPA compliance voicemail message was left with return callback number.  Plan: RN will call patient again within 14 days.   [place] Care Management [phone]

## 2017-05-18 ENCOUNTER — Other Ambulatory Visit: Payer: Self-pay | Admitting: Pharmacist

## 2017-05-18 NOTE — Patient Outreach (Signed)
[place] [facility]) Care Management  05/18/2017  Shawn Sullivan 1946/08/30 [id number]   Patient is a 71 year old male referred for medication reconciliation and assistance by [employer]. Called today to discuss medication assistance and spoke with patients wife.  She states patient has received Breo Ellipta and Victoza samples from his providers office and they have not picked the prescriptions up from the pharmacy this year.  She believes the doctors office will continue to be able to provide samples.    Medication Assistance: [employer] medicare patient currently in the deductible phase that cannot afford his copays for Victoza and Breo are ~$200.  Per his pharmacy his 3 month supply of Victoza wis $225 with $84 going towards his deductible.  Patient is above low income subsidy/extra help income level and has not met out of pocket spend for manufacturer patient assistance.  Patient has previously received samples but has not purchased Victoza or Breo Ellipta this year.   Plan: -Discussed with wife that copays would decrease for Victoza and Breo Ellipta to ~$47 per month once patient meets the deductible.  Patients wife demonstrated understanding but will still try to obtain medications as samples from providers. -Patient has not met out of pocket spend to qualify for manufacturer patient assistance and is above low income subsidy extra help income limits -Instructed patient to call [place] if they are unable to receive samples/afford medications in the future -[place] will sign off.  Please reconsult if needed.  Bennye Alm, PharmD, [place] PGY2 Pharmacy Resident [phone]

## 2017-05-19 ENCOUNTER — Ambulatory Visit: Payer: Self-pay | Admitting: *Deleted

## 2017-05-31 DIAGNOSIS — M67919 Unspecified disorder of synovium and tendon, unspecified shoulder: Secondary | ICD-10-CM | POA: Diagnosis not present

## 2017-05-31 DIAGNOSIS — M961 Postlaminectomy syndrome, not elsewhere classified: Secondary | ICD-10-CM | POA: Diagnosis not present

## 2017-05-31 DIAGNOSIS — G894 Chronic pain syndrome: Secondary | ICD-10-CM | POA: Diagnosis not present

## 2017-05-31 DIAGNOSIS — M47817 Spondylosis without myelopathy or radiculopathy, lumbosacral region: Secondary | ICD-10-CM | POA: Diagnosis not present

## 2017-05-31 DIAGNOSIS — M719 Bursopathy, unspecified: Secondary | ICD-10-CM | POA: Diagnosis not present

## 2017-06-04 DIAGNOSIS — L988 Other specified disorders of the skin and subcutaneous tissue: Secondary | ICD-10-CM | POA: Diagnosis not present

## 2017-06-07 DIAGNOSIS — L03114 Cellulitis of left upper limb: Secondary | ICD-10-CM | POA: Diagnosis not present

## 2017-06-08 ENCOUNTER — Other Ambulatory Visit: Payer: Self-pay | Admitting: *Deleted

## 2017-06-08 NOTE — Patient Outreach (Signed)
[place] [facility]) Care Management  06/08/2017  WINFORD HEHN [birth year] [id number]   RN Health Coach attempted follow up outreach call to patient.  Patient was unavailable. HIPPA compliance voicemail message left with return callback number.  Plan: RN will call patient again within 14 days.  [place] Care Management [phone]

## 2017-06-14 ENCOUNTER — Encounter: Payer: Self-pay | Admitting: *Deleted

## 2017-06-14 ENCOUNTER — Other Ambulatory Visit: Payer: Self-pay | Admitting: *Deleted

## 2017-06-14 NOTE — Patient Outreach (Signed)
[facility]) Care Management  06/14/2017   Shawn Sullivan 08-Aug-1946 [id number]  RN Health Coach telephone call to patient.  Hipaa compliance verified. Patient was extremely short of breath. He stated when he goes outside it is so hard to breath. He had used his inhalers and was getting ready to use his nebulizer. I had originally called the patient to give more education on CHF. Per patient he never knew anything about him having heart failure. Patient stated he does have swelling in his lower extremities. Patient is extremely short of breath. Per patient his doctor had increased his lasix but he had not taken it for the day. Per patient he does not have a scale and right now  Can't afford one because of paying for the Breo in between samples provided by Dr. Patient is morbidly obese. He is on BiPAP. Patient has agreed to follow up outreach calls.    Current Medications:  Current Outpatient Prescriptions  Medication Sig Dispense Refill  . albuterol (PROVENTIL HFA;VENTOLIN HFA) 108 (90 Base) MCG/ACT inhaler Inhale 1-2 puffs into the lungs every 6 (six) hours as needed.    [name] aspirin (GOODSENSE ASPIRIN) 325 MG tablet Take 325 mg by mouth daily.    [name] atorvastatin (LIPITOR) 40 MG tablet Take 40 mg by mouth daily.    . carvedilol (COREG) 25 MG tablet Take 25 mg by mouth 2 (two) times daily with a meal.    . chlorthalidone (HYGROTON) 50 MG tablet Take 50 mg by mouth daily.    . Coenzyme Q10 (COQ10) 100 MG CAPS Take 100 mg by mouth daily.    . fluticasone furoate-vilanterol (BREO ELLIPTA) 100-25 MCG/INH AEPB Inhale 1 puff into the lungs daily.    . furosemide (LASIX) 20 MG tablet Take 20 mg by mouth daily.    [name] glipiZIDE (GLUCOTROL) 10 MG tablet Take 10 mg by mouth 2 (two) times daily.    . hydrALAZINE (APRESOLINE) 50 MG tablet Take 50 mg by mouth 2 (two) times daily.    [name] HYDROcodone-acetaminophen (NORCO) 10-325 MG tablet Take 1 tablet by mouth every 4 (four) hours as needed.     . liraglutide (VICTOZA) 18 MG/3ML SOPN Inject 1.2 mg into the skin daily.    . metFORMIN (GLUCOPHAGE) 1000 MG tablet Take 1,000 mg by mouth 2 (two) times daily.    . montelukast (SINGULAIR) 10 MG tablet Take 10 mg by mouth at bedtime.    [name] omeprazole (PRILOSEC) 20 MG capsule Take 20 mg by mouth daily.    . sertraline (ZOLOFT) 50 MG tablet Take 50 mg by mouth 2 (two) times daily.    . tamsulosin (FLOMAX) 0.4 MG CAPS capsule Take 0.4 mg by mouth daily.     No current facility-administered medications for this visit.     Functional Status:  In your present state of health, do you have any difficulty performing the following activities: 06/14/2017  Hearing? N  Vision? N  Difficulty concentrating or making decisions? N  Walking or climbing stairs? Y  Dressing or bathing? N  Doing errands, shopping? Y  Some recent data might be hidden    Fall/Depression Screening: Fall Risk  06/14/2017  Falls in the past year? Yes  Number falls in past yr: 1  Injury with Fall? Yes  Risk Factor Category  High Fall Risk  Risk for fall due to : History of fall(s);Impaired balance/gait;Impaired mobility  Follow up Falls evaluation completed;Education provided;Falls prevention discussed   PHQ 2/9 Scores 06/14/2017 04/13/2017  PHQ - 2 Score 0 0   THN CM Care Plan Problem One     Most Recent Value  Care Plan Problem One  Knowledge deficit in self management of COPD  Role Documenting the Problem One  Health [place] for Problem One  Active  [facility] Long Term Goal   Patient will not have any COPD exacerbations within the next 90 days  THN Long Term Goal Start Date  06/14/17  Interventions for Problem One Long Term Goal  RN sent educational material on COPD exacerbation. RN discussed medication adherence. RN will follow up with further discussion  THN CM Short Term Goal #1   Patient will be able to verbalize zones within the next 30 days  THN CM Short Term Goal #1 Start Date  06/14/17  Interventions  for Short Term Goal #1  RN sent educational material on zones.RN will follow up with further discussion of zones with teach back  THN CM Short Term Goal #2   Patient will be able to verbalize action plan for zones within the next 30 days  THN CM Short Term Goal #2 Start Date  06/14/17  Interventions for Short Term Goal #2  RN discussed with patient what action plan he is doing when he gets real short of breath. RN sent educational material on action plans. RN sent educational material on using inhalers properly and purse-lip breathing  THN CM Short Term Goal #3  Patient will be able to distinguish between symptoms of CHf vs COPD exaberation  THN CM Short Term Goal #3 Start Date  06/14/17  Interventions for Short Tern Goal #3  RN discussed briefly on chf and lower extremity edema. RN discussed symptoms of copd. RN sent educational material on CHF and COPD. RN will follow up with discussion and teach back      Assessment:  Patient has lower extremity swelling Patient very short of breath Patient hasn't taken lasix today as ordered Patient has not used nebulizer today Patient unaware of heart failure Patient will benefit from Health Coach telephonic outreach for education and support for COPD self management.  Plan: RN sent calendar book to document weights RN sent a scale for self weighing and monitoring RN sent educational material on COPD with zones and action plan RN sent educational book on CHF RN sent educational material on purse lip breathing RN sent educational material on falls prevention RN will follow up in August with discussion and teach back  [place] Management [phone]

## 2017-07-13 DIAGNOSIS — G4733 Obstructive sleep apnea (adult) (pediatric): Secondary | ICD-10-CM | POA: Diagnosis not present

## 2017-07-13 DIAGNOSIS — E782 Mixed hyperlipidemia: Secondary | ICD-10-CM | POA: Diagnosis not present

## 2017-07-13 DIAGNOSIS — I10 Essential (primary) hypertension: Secondary | ICD-10-CM | POA: Diagnosis not present

## 2017-07-13 DIAGNOSIS — J410 Simple chronic bronchitis: Secondary | ICD-10-CM | POA: Diagnosis not present

## 2017-07-13 DIAGNOSIS — L02811 Cutaneous abscess of head [any part, except face]: Secondary | ICD-10-CM | POA: Diagnosis not present

## 2017-07-13 DIAGNOSIS — Z6841 Body Mass Index (BMI) 40.0 and over, adult: Secondary | ICD-10-CM | POA: Diagnosis not present

## 2017-07-13 DIAGNOSIS — E1142 Type 2 diabetes mellitus with diabetic polyneuropathy: Secondary | ICD-10-CM | POA: Diagnosis not present

## 2017-07-13 DIAGNOSIS — E119 Type 2 diabetes mellitus without complications: Secondary | ICD-10-CM | POA: Diagnosis not present

## 2017-07-14 DIAGNOSIS — E119 Type 2 diabetes mellitus without complications: Secondary | ICD-10-CM | POA: Diagnosis not present

## 2017-07-14 DIAGNOSIS — J449 Chronic obstructive pulmonary disease, unspecified: Secondary | ICD-10-CM | POA: Diagnosis not present

## 2017-07-14 DIAGNOSIS — I10 Essential (primary) hypertension: Secondary | ICD-10-CM | POA: Diagnosis not present

## 2017-07-14 DIAGNOSIS — L0211 Cutaneous abscess of neck: Secondary | ICD-10-CM | POA: Diagnosis not present

## 2017-07-15 ENCOUNTER — Other Ambulatory Visit: Payer: Self-pay | Admitting: *Deleted

## 2017-07-17 ENCOUNTER — Encounter: Payer: Self-pay | Admitting: *Deleted

## 2017-07-17 NOTE — Patient Outreach (Signed)
[place] [facility]) Care Management  07/15/2017 Late entry  Shawn Sullivan Nov 14, 1946 [id number] . RN Health Coach telephone call to patient.  Hipaa compliance verified. Per patient he has received the information sent by [facility] Coach. Per patient he has received the scale and is weighing every day. Patient weight today is 338.6. Patient stated that he does get short of breath on minimal exertion. Patient has a little lower extremity edema. RN is stressing the importance of weighing to document weight gain of 2 pounds of more in  One day or 5 pounds in 5 days. Patient stated since he has gotten the scale the most he has gained is one pound. Per patient he is using his medication as prescribed. Patient stated he has a non productive cough. Patient went to Dermatologist because he has a skin infection on side of head with MRSA. Per patient he has not had any falls lately. Patient stated that he uses his cane to ambulate with most of the time. Patient is trying to control his diet. He is eating low sodium diabetic diet. Patient has agreed to follow up outreach calls.  Current Medications:  Current Outpatient Prescriptions  Medication Sig Dispense Refill  . albuterol (PROVENTIL HFA;VENTOLIN HFA) 108 (90 Base) MCG/ACT inhaler Inhale 1-2 puffs into the lungs every 6 (six) hours as needed.    [name] aspirin (GOODSENSE ASPIRIN) 325 MG tablet Take 325 mg by mouth daily.    [name] atorvastatin (LIPITOR) 40 MG tablet Take 40 mg by mouth daily.    . carvedilol (COREG) 25 MG tablet Take 25 mg by mouth 2 (two) times daily with a meal.    . chlorthalidone (HYGROTON) 50 MG tablet Take 50 mg by mouth daily.    . Coenzyme Q10 (COQ10) 100 MG CAPS Take 100 mg by mouth daily.    . fluticasone furoate-vilanterol (BREO ELLIPTA) 100-25 MCG/INH AEPB Inhale 1 puff into the lungs daily.    . furosemide (LASIX) 20 MG tablet Take 20 mg by mouth daily.    [name] glipiZIDE (GLUCOTROL) 10 MG tablet Take 10 mg by mouth 2  (two) times daily.    . hydrALAZINE (APRESOLINE) 50 MG tablet Take 50 mg by mouth 2 (two) times daily.    [name] HYDROcodone-acetaminophen (NORCO) 10-325 MG tablet Take 1 tablet by mouth every 4 (four) hours as needed.    . liraglutide (VICTOZA) 18 MG/3ML SOPN Inject 1.2 mg into the skin daily.    . metFORMIN (GLUCOPHAGE) 1000 MG tablet Take 1,000 mg by mouth 2 (two) times daily.    . montelukast (SINGULAIR) 10 MG tablet Take 10 mg by mouth at bedtime.    [name] omeprazole (PRILOSEC) 20 MG capsule Take 20 mg by mouth daily.    . sertraline (ZOLOFT) 50 MG tablet Take 50 mg by mouth 2 (two) times daily.    . tamsulosin (FLOMAX) 0.4 MG CAPS capsule Take 0.4 mg by mouth daily.     No current facility-administered medications for this visit.     Functional Status:  In your present state of health, do you have any difficulty performing the following activities: 07/15/2017 06/14/2017  Hearing? N N  Vision? N N  Difficulty concentrating or making decisions? N N  Walking or climbing stairs? Y Y  Dressing or bathing? N N  Doing errands, shopping? [place]  Preparing Food and eating ? Y -  Comment wife prepares -  Using the Toilet? N -  In the past six months, have you  accidently leaked urine? N -  Do you have problems with loss of bowel control? N -  Managing your Medications? N -  Managing your Finances? N -  Housekeeping or managing your Housekeeping? Y -  Comment wife does housekeeping -  Some recent data might be hidden    Fall/Depression Screening: Fall Risk  07/15/2017 06/14/2017  Falls in the past year? Yes Yes  Number falls in past yr: 1 1  Injury with Fall? Yes Yes  Comment - skin tear  Risk Factor Category  High Fall Risk High Fall Risk  Risk for fall due to : History of fall(s);Impaired balance/gait;Impaired mobility History of fall(s);Impaired balance/gait;Impaired mobility  Follow up Falls evaluation completed;Education provided;Falls prevention discussed Falls evaluation completed;Education  provided;Falls prevention discussed   PHQ 2/9 Scores 07/15/2017 06/14/2017 04/13/2017  PHQ - 2 Score 0 0 0   THN CM Care Plan Problem One     Most Recent Value  Care Plan Problem One  Knowledge deficit in self management of COPD  Role Documenting the Problem One  Health [place] for Problem One  Active  THN Long Term Goal   Patient will not have any COPD exacerbations within the next 90 days  Interventions for Problem One Long Term Goal  RN sent educational material on COPD exacerbation. RN discussed medication adherence. RN will follow up with further discussion  THN CM Short Term Goal #1   Patient will be able to verbalize zones within the next 30 days  THN CM Short Term Goal #1 Start Date  07/17/17  Interventions for Short Term Goal #1  RN sent educational material on zones.RN will follow up with further discussion of zones with teach back  THN CM Short Term Goal #2   Patient will be able to verbalize action plan for zones within the next 30 days  THN CM Short Term Goal #2 Start Date  07/17/17  Interventions for Short Term Goal #2  RN discussed with patient what action plan he is doing when he gets real short of breath. RN sent educational material on action plans. RN sent educational material on using inhalers properly and purse-lip breathing  THN CM Short Term Goal #3  Patient will be able to distinguish between symptoms of CHf vs COPD exaberation  THN CM Short Term Goal #3 Start Date  07/17/17  Interventions for Short Tern Goal #3  RN discussed briefly on chf and lower extremity edema. RN discussed symptoms of copd. RN sent educational material on CHF and COPD. RN will follow up with discussion and teach back      Assessment:  Patient is now weighing daily Patient has not had any falls since last outreach Patient is using medications as prescribed Patient will continue to benefit from [employer] telephonic outreach for education and support for COPD self  management.   Plan:  RN will send patient educational material on heat exhaustion RN will send educational material on elderly rehydration RN will send patient EMMI on importance of weighing RN will follow up within the month of September for discussion and teach back   [place] Management [phone]

## 2017-07-21 DIAGNOSIS — D6489 Other specified anemias: Secondary | ICD-10-CM | POA: Diagnosis not present

## 2017-07-21 DIAGNOSIS — R944 Abnormal results of kidney function studies: Secondary | ICD-10-CM | POA: Diagnosis not present

## 2017-08-17 ENCOUNTER — Other Ambulatory Visit: Payer: Self-pay | Admitting: *Deleted

## 2017-08-17 NOTE — Patient Outreach (Signed)
[facility] [facility]) Care Management  08/17/2017  TAEVON ASCHOFF 12/14/1945 [id number]   RN Health Coach attempted#1 follow up outreach call to patient.  Patient was unavailable. HIPPA compliance voicemail message left with return callback number.  Plan: RN will call patient again within 14 days.  [place] Care Management [phone]

## 2017-08-30 DIAGNOSIS — M961 Postlaminectomy syndrome, not elsewhere classified: Secondary | ICD-10-CM | POA: Diagnosis not present

## 2017-08-30 DIAGNOSIS — G894 Chronic pain syndrome: Secondary | ICD-10-CM

## 2017-08-30 HISTORY — DX: Chronic pain syndrome: G89.4

## 2017-08-31 ENCOUNTER — Ambulatory Visit: Payer: Self-pay | Admitting: *Deleted

## 2017-10-04 DIAGNOSIS — J449 Chronic obstructive pulmonary disease, unspecified: Secondary | ICD-10-CM | POA: Diagnosis not present

## 2017-10-04 DIAGNOSIS — Z6841 Body Mass Index (BMI) 40.0 and over, adult: Secondary | ICD-10-CM | POA: Insufficient documentation

## 2017-10-04 DIAGNOSIS — E66813 Obesity, class 3: Secondary | ICD-10-CM

## 2017-10-04 DIAGNOSIS — G4733 Obstructive sleep apnea (adult) (pediatric): Secondary | ICD-10-CM | POA: Diagnosis not present

## 2017-10-04 HISTORY — DX: Obesity, class 3: E66.813

## 2017-10-04 HISTORY — DX: Body mass index (BMI) 40.0-44.9, adult: E66.01

## 2017-10-07 DIAGNOSIS — R601 Generalized edema: Secondary | ICD-10-CM | POA: Diagnosis not present

## 2017-10-07 DIAGNOSIS — J449 Chronic obstructive pulmonary disease, unspecified: Secondary | ICD-10-CM | POA: Diagnosis not present

## 2017-10-07 DIAGNOSIS — J45909 Unspecified asthma, uncomplicated: Secondary | ICD-10-CM | POA: Diagnosis not present

## 2017-10-07 DIAGNOSIS — G4733 Obstructive sleep apnea (adult) (pediatric): Secondary | ICD-10-CM | POA: Diagnosis not present

## 2017-10-19 DIAGNOSIS — E1142 Type 2 diabetes mellitus with diabetic polyneuropathy: Secondary | ICD-10-CM | POA: Diagnosis not present

## 2017-10-19 DIAGNOSIS — Z6841 Body Mass Index (BMI) 40.0 and over, adult: Secondary | ICD-10-CM | POA: Diagnosis not present

## 2017-10-19 DIAGNOSIS — I10 Essential (primary) hypertension: Secondary | ICD-10-CM | POA: Diagnosis not present

## 2017-10-19 DIAGNOSIS — J410 Simple chronic bronchitis: Secondary | ICD-10-CM | POA: Diagnosis not present

## 2017-10-19 DIAGNOSIS — Z23 Encounter for immunization: Secondary | ICD-10-CM | POA: Diagnosis not present

## 2017-10-19 DIAGNOSIS — E782 Mixed hyperlipidemia: Secondary | ICD-10-CM | POA: Diagnosis not present

## 2017-10-19 DIAGNOSIS — G4733 Obstructive sleep apnea (adult) (pediatric): Secondary | ICD-10-CM | POA: Diagnosis not present

## 2017-12-06 DIAGNOSIS — G894 Chronic pain syndrome: Secondary | ICD-10-CM | POA: Diagnosis not present

## 2017-12-06 DIAGNOSIS — M961 Postlaminectomy syndrome, not elsewhere classified: Secondary | ICD-10-CM | POA: Diagnosis not present

## 2017-12-15 DIAGNOSIS — R600 Localized edema: Secondary | ICD-10-CM | POA: Diagnosis not present

## 2017-12-15 DIAGNOSIS — J018 Other acute sinusitis: Secondary | ICD-10-CM | POA: Diagnosis not present

## 2017-12-24 ENCOUNTER — Other Ambulatory Visit: Payer: Self-pay | Admitting: *Deleted

## 2017-12-24 NOTE — Patient Outreach (Signed)
[place] [facility]) Care Management  12/24/2017  Shawn Sullivan [birth year] [id number]   RN Health Coach telephone call to patient.  Hipaa compliance verified. Per patient he is on his way out. Patient requested that I call back.  Plan: RN will call patient again within 14 days.  [place] Care Management [phone]

## 2017-12-29 ENCOUNTER — Other Ambulatory Visit: Payer: Self-pay | Admitting: *Deleted

## 2017-12-29 NOTE — Patient Outreach (Signed)
[facility] [facility]) Care Management  12/29/2017  BURLEIGH BROCKMANN June 17, 1946 [id number]   RN Health Coach attempted  #2 follow up outreach call to patient.  Patient was unavailable. HIPPA compliance voicemail message left with return callback number.  Plan: RN will call patient again within 14 days.  [place] Care Management [phone]

## 2018-01-11 DIAGNOSIS — H52203 Unspecified astigmatism, bilateral: Secondary | ICD-10-CM | POA: Diagnosis not present

## 2018-01-11 DIAGNOSIS — Z961 Presence of intraocular lens: Secondary | ICD-10-CM | POA: Diagnosis not present

## 2018-01-11 DIAGNOSIS — E119 Type 2 diabetes mellitus without complications: Secondary | ICD-10-CM | POA: Diagnosis not present

## 2018-01-11 DIAGNOSIS — H524 Presbyopia: Secondary | ICD-10-CM | POA: Diagnosis not present

## 2018-01-11 DIAGNOSIS — Z7984 Long term (current) use of oral hypoglycemic drugs: Secondary | ICD-10-CM | POA: Diagnosis not present

## 2018-01-14 ENCOUNTER — Other Ambulatory Visit: Payer: Self-pay | Admitting: *Deleted

## 2018-01-14 NOTE — Patient Outreach (Signed)
[facility]) Care Management  01/14/2018   MAXAMILLIAN TIENDA [birth year] [phone]  RN Health Coach telephone call to patient.  Hipaa compliance verified. Per patient he is doing pretty good. Patient is in the yellow zone with no new symptoms.  Patient is taking medications as prescribed. Patient received scale. Patient understands zones and action plan. Per patient he has changed health care providers . Rn will close case.  Current Medications:  Current Outpatient Medications  Medication Sig Dispense Refill  . albuterol (PROVENTIL HFA;VENTOLIN HFA) 108 (90 Base) MCG/ACT inhaler Inhale 1-2 puffs into the lungs every 6 (six) hours as needed.    [name] aspirin (GOODSENSE ASPIRIN) 325 MG tablet Take 325 mg by mouth daily.    [name] atorvastatin (LIPITOR) 40 MG tablet Take 40 mg by mouth daily.    . carvedilol (COREG) 25 MG tablet Take 25 mg by mouth 2 (two) times daily with a meal.    . chlorthalidone (HYGROTON) 50 MG tablet Take 50 mg by mouth daily.    . Coenzyme Q10 (COQ10) 100 MG CAPS Take 100 mg by mouth daily.    . fluticasone furoate-vilanterol (BREO ELLIPTA) 100-25 MCG/INH AEPB Inhale 1 puff into the lungs daily.    . furosemide (LASIX) 20 MG tablet Take 20 mg by mouth daily.    [name] glipiZIDE (GLUCOTROL) 10 MG tablet Take 10 mg by mouth 2 (two) times daily.    . hydrALAZINE (APRESOLINE) 50 MG tablet Take 50 mg by mouth 2 (two) times daily.    [name] HYDROcodone-acetaminophen (NORCO) 10-325 MG tablet Take 1 tablet by mouth every 4 (four) hours as needed.    . liraglutide (VICTOZA) 18 MG/3ML SOPN Inject 1.2 mg into the skin daily.    . metFORMIN (GLUCOPHAGE) 1000 MG tablet Take 1,000 mg by mouth 2 (two) times daily.    . montelukast (SINGULAIR) 10 MG tablet Take 10 mg by mouth at bedtime.    [name] omeprazole (PRILOSEC) 20 MG capsule Take 20 mg by mouth daily.    . sertraline (ZOLOFT) 50 MG tablet Take 50 mg by mouth 2 (two) times daily.    . tamsulosin (FLOMAX) 0.4 MG CAPS capsule Take  0.4 mg by mouth daily.     No current facility-administered medications for this visit.     Functional Status:  In your present state of health, do you have any difficulty performing the following activities: 01/14/2018 07/15/2017  Hearing? N N  Vision? N N  Difficulty concentrating or making decisions? N N  Walking or climbing stairs? Y Y  Dressing or bathing? N N  Doing errands, shopping? Tempie Donning  Preparing Food and eating ? Tempie Donning  Comment - wife prepares  Using the Toilet? N N  In the past six months, have you accidently leaked urine? N N  Do you have problems with loss of bowel control? N N  Managing your Medications? N N  Managing your Finances? N N  Housekeeping or managing your Housekeeping? Y Y  Comment - wife does housekeeping  Some recent data might be hidden    Fall/Depression Screening: Fall Risk  01/14/2018 07/15/2017 06/14/2017  Falls in the past year? Yes Yes Yes  Number falls in past yr: _0 Injury with Fall? Yes Yes Yes  Comment - - skin tear  Risk Factor Category  High Fall Risk High Fall Risk High Fall Risk  Risk for fall due to : History of fall(s);Impaired balance/gait;Impaired mobility History of fall(s);Impaired balance/gait;Impaired mobility  History of fall(s);Impaired balance/gait;Impaired mobility  Follow up Falls evaluation completed Falls evaluation completed;Education provided;Falls prevention discussed Falls evaluation completed;Education provided;Falls prevention discussed   PHQ 2/9 Scores 01/14/2018 07/15/2017 06/14/2017 04/13/2017  PHQ - 2 Score 0 0 0 0    Assessment:  Patient knows zones and action plan of COPD Patient is taking medications as prescribed Patient has a scale to weigh and notify physician of weight gain Patient goals are met Plan:  RN closed program at this time RN sent physician and patient closure letter RN sent CMA closure letter  [facility] [phone]

## 2018-01-21 DIAGNOSIS — Z6841 Body Mass Index (BMI) 40.0 and over, adult: Secondary | ICD-10-CM | POA: Diagnosis not present

## 2018-01-21 DIAGNOSIS — I10 Essential (primary) hypertension: Secondary | ICD-10-CM | POA: Diagnosis not present

## 2018-01-21 DIAGNOSIS — G4733 Obstructive sleep apnea (adult) (pediatric): Secondary | ICD-10-CM | POA: Diagnosis not present

## 2018-01-21 DIAGNOSIS — J018 Other acute sinusitis: Secondary | ICD-10-CM | POA: Diagnosis not present

## 2018-01-21 DIAGNOSIS — B370 Candidal stomatitis: Secondary | ICD-10-CM | POA: Diagnosis not present

## 2018-01-21 DIAGNOSIS — J410 Simple chronic bronchitis: Secondary | ICD-10-CM | POA: Diagnosis not present

## 2018-01-21 DIAGNOSIS — E1142 Type 2 diabetes mellitus with diabetic polyneuropathy: Secondary | ICD-10-CM | POA: Diagnosis not present

## 2018-01-21 DIAGNOSIS — E1121 Type 2 diabetes mellitus with diabetic nephropathy: Secondary | ICD-10-CM | POA: Diagnosis not present

## 2018-01-21 DIAGNOSIS — E782 Mixed hyperlipidemia: Secondary | ICD-10-CM | POA: Diagnosis not present

## 2018-02-10 DIAGNOSIS — Z6841 Body Mass Index (BMI) 40.0 and over, adult: Secondary | ICD-10-CM | POA: Diagnosis not present

## 2018-02-10 DIAGNOSIS — J449 Chronic obstructive pulmonary disease, unspecified: Secondary | ICD-10-CM | POA: Diagnosis not present

## 2018-02-10 DIAGNOSIS — G4733 Obstructive sleep apnea (adult) (pediatric): Secondary | ICD-10-CM | POA: Diagnosis not present

## 2018-02-23 DIAGNOSIS — N183 Chronic kidney disease, stage 3 (moderate): Secondary | ICD-10-CM | POA: Diagnosis not present

## 2018-02-23 DIAGNOSIS — E1121 Type 2 diabetes mellitus with diabetic nephropathy: Secondary | ICD-10-CM | POA: Diagnosis not present

## 2018-03-03 DIAGNOSIS — G894 Chronic pain syndrome: Secondary | ICD-10-CM | POA: Diagnosis not present

## 2018-03-03 DIAGNOSIS — M961 Postlaminectomy syndrome, not elsewhere classified: Secondary | ICD-10-CM | POA: Diagnosis not present

## 2018-04-29 DIAGNOSIS — E1142 Type 2 diabetes mellitus with diabetic polyneuropathy: Secondary | ICD-10-CM | POA: Diagnosis not present

## 2018-04-29 DIAGNOSIS — G4733 Obstructive sleep apnea (adult) (pediatric): Secondary | ICD-10-CM | POA: Diagnosis not present

## 2018-04-29 DIAGNOSIS — E1121 Type 2 diabetes mellitus with diabetic nephropathy: Secondary | ICD-10-CM | POA: Diagnosis not present

## 2018-04-29 DIAGNOSIS — J410 Simple chronic bronchitis: Secondary | ICD-10-CM | POA: Diagnosis not present

## 2018-04-29 DIAGNOSIS — Z6841 Body Mass Index (BMI) 40.0 and over, adult: Secondary | ICD-10-CM | POA: Diagnosis not present

## 2018-04-29 DIAGNOSIS — E782 Mixed hyperlipidemia: Secondary | ICD-10-CM | POA: Diagnosis not present

## 2018-04-29 DIAGNOSIS — I10 Essential (primary) hypertension: Secondary | ICD-10-CM | POA: Diagnosis not present

## 2018-04-29 DIAGNOSIS — K219 Gastro-esophageal reflux disease without esophagitis: Secondary | ICD-10-CM | POA: Diagnosis not present

## 2018-05-26 DIAGNOSIS — M791 Myalgia, unspecified site: Secondary | ICD-10-CM | POA: Diagnosis not present

## 2018-05-26 DIAGNOSIS — R601 Generalized edema: Secondary | ICD-10-CM | POA: Diagnosis not present

## 2018-05-26 DIAGNOSIS — G5601 Carpal tunnel syndrome, right upper limb: Secondary | ICD-10-CM | POA: Diagnosis not present

## 2018-05-26 DIAGNOSIS — I10 Essential (primary) hypertension: Secondary | ICD-10-CM | POA: Diagnosis not present

## 2018-05-30 DIAGNOSIS — M961 Postlaminectomy syndrome, not elsewhere classified: Secondary | ICD-10-CM | POA: Diagnosis not present

## 2018-05-30 DIAGNOSIS — G894 Chronic pain syndrome: Secondary | ICD-10-CM | POA: Diagnosis not present

## 2018-05-31 DIAGNOSIS — R69 Illness, unspecified: Secondary | ICD-10-CM | POA: Diagnosis not present

## 2018-06-01 DIAGNOSIS — R69 Illness, unspecified: Secondary | ICD-10-CM | POA: Diagnosis not present

## 2018-06-01 DIAGNOSIS — M545 Low back pain: Secondary | ICD-10-CM | POA: Diagnosis not present

## 2018-06-01 DIAGNOSIS — M62830 Muscle spasm of back: Secondary | ICD-10-CM | POA: Diagnosis not present

## 2018-06-01 DIAGNOSIS — M6281 Muscle weakness (generalized): Secondary | ICD-10-CM | POA: Diagnosis not present

## 2018-06-20 DIAGNOSIS — R002 Palpitations: Secondary | ICD-10-CM | POA: Diagnosis not present

## 2018-06-20 DIAGNOSIS — R600 Localized edema: Secondary | ICD-10-CM | POA: Insufficient documentation

## 2018-06-20 DIAGNOSIS — G4733 Obstructive sleep apnea (adult) (pediatric): Secondary | ICD-10-CM | POA: Diagnosis not present

## 2018-06-20 DIAGNOSIS — R0602 Shortness of breath: Secondary | ICD-10-CM | POA: Insufficient documentation

## 2018-06-20 DIAGNOSIS — Z6841 Body Mass Index (BMI) 40.0 and over, adult: Secondary | ICD-10-CM | POA: Diagnosis not present

## 2018-06-20 DIAGNOSIS — J449 Chronic obstructive pulmonary disease, unspecified: Secondary | ICD-10-CM | POA: Diagnosis not present

## 2018-06-30 DIAGNOSIS — R600 Localized edema: Secondary | ICD-10-CM | POA: Diagnosis not present

## 2018-06-30 DIAGNOSIS — I481 Persistent atrial fibrillation: Secondary | ICD-10-CM | POA: Diagnosis not present

## 2018-06-30 DIAGNOSIS — E1142 Type 2 diabetes mellitus with diabetic polyneuropathy: Secondary | ICD-10-CM | POA: Diagnosis not present

## 2018-06-30 DIAGNOSIS — I10 Essential (primary) hypertension: Secondary | ICD-10-CM | POA: Diagnosis not present

## 2018-06-30 DIAGNOSIS — J410 Simple chronic bronchitis: Secondary | ICD-10-CM | POA: Diagnosis not present

## 2018-06-30 DIAGNOSIS — G4733 Obstructive sleep apnea (adult) (pediatric): Secondary | ICD-10-CM | POA: Diagnosis not present

## 2018-06-30 DIAGNOSIS — N183 Chronic kidney disease, stage 3 (moderate): Secondary | ICD-10-CM | POA: Diagnosis not present

## 2018-06-30 DIAGNOSIS — E1121 Type 2 diabetes mellitus with diabetic nephropathy: Secondary | ICD-10-CM | POA: Diagnosis not present

## 2018-06-30 DIAGNOSIS — R0602 Shortness of breath: Secondary | ICD-10-CM | POA: Diagnosis not present

## 2018-06-30 DIAGNOSIS — E782 Mixed hyperlipidemia: Secondary | ICD-10-CM | POA: Diagnosis not present

## 2018-07-01 ENCOUNTER — Ambulatory Visit: Payer: Medicare HMO | Admitting: Cardiology

## 2018-07-01 ENCOUNTER — Encounter: Payer: Self-pay | Admitting: Cardiology

## 2018-07-01 DIAGNOSIS — I480 Paroxysmal atrial fibrillation: Secondary | ICD-10-CM | POA: Insufficient documentation

## 2018-07-01 DIAGNOSIS — Z7901 Long term (current) use of anticoagulants: Secondary | ICD-10-CM | POA: Diagnosis not present

## 2018-07-01 DIAGNOSIS — I4819 Other persistent atrial fibrillation: Secondary | ICD-10-CM

## 2018-07-01 DIAGNOSIS — N1832 Chronic kidney disease, stage 3b: Secondary | ICD-10-CM

## 2018-07-01 DIAGNOSIS — G4733 Obstructive sleep apnea (adult) (pediatric): Secondary | ICD-10-CM

## 2018-07-01 DIAGNOSIS — N189 Chronic kidney disease, unspecified: Secondary | ICD-10-CM

## 2018-07-01 DIAGNOSIS — I119 Hypertensive heart disease without heart failure: Secondary | ICD-10-CM

## 2018-07-01 DIAGNOSIS — J441 Chronic obstructive pulmonary disease with (acute) exacerbation: Secondary | ICD-10-CM

## 2018-07-01 DIAGNOSIS — N183 Chronic kidney disease, stage 3 unspecified: Secondary | ICD-10-CM | POA: Insufficient documentation

## 2018-07-01 DIAGNOSIS — J449 Chronic obstructive pulmonary disease, unspecified: Secondary | ICD-10-CM | POA: Diagnosis not present

## 2018-07-01 DIAGNOSIS — I481 Persistent atrial fibrillation: Secondary | ICD-10-CM

## 2018-07-01 HISTORY — DX: Paroxysmal atrial fibrillation: I48.0

## 2018-07-01 HISTORY — DX: Chronic obstructive pulmonary disease with (acute) exacerbation: J44.1

## 2018-07-01 HISTORY — DX: Other persistent atrial fibrillation: I48.19

## 2018-07-01 HISTORY — DX: Chronic kidney disease, stage 3b: N18.32

## 2018-07-01 HISTORY — DX: Chronic obstructive pulmonary disease, unspecified: J44.9

## 2018-07-01 HISTORY — DX: Obstructive sleep apnea (adult) (pediatric): G47.33

## 2018-07-01 HISTORY — DX: Long term (current) use of anticoagulants: Z79.01

## 2018-07-01 HISTORY — DX: Hypertensive heart disease without heart failure: I11.9

## 2018-07-01 MED ORDER — METOPROLOL TARTRATE 50 MG PO TABS: 50.0000 mg | ORAL_TABLET | Freq: Two times a day (BID) | ORAL | 3 refills | Status: DC

## 2018-07-01 NOTE — Patient Instructions (Addendum)
Medication Instructions:  Your physician has recommended you make the following change in your medication:  STOP chlorithalidone STOP carvedilol STOP verapamil START lopressor 50 mg twice daily  Labwork: None  Testing/Procedures: Your physician has requested that you have an echocardiogram. Echocardiography is a painless test that uses sound waves to create images of your heart. It provides your doctor with information about the size and shape of your heart and how well your heart's chambers and valves are working. This procedure takes approximately one hour. There are no restrictions for this procedure.  Your physician has recommended that you wear a holter monitor. Holter monitors are medical devices that record the heart's electrical activity. Doctors most often use these monitors to diagnose arrhythmias. Arrhythmias are problems with the speed or rhythm of the heartbeat. The monitor is a small, portable device. You can wear one while you do your normal daily activities. This is usually used to diagnose what is causing palpitations/syncope (passing out).  Follow-Up: Your physician recommends that you schedule a follow-up appointment in: 1 month  Any Other Special Instructions Will Be Listed Below (If Applicable).  Check heart rate daily and log this.   If you need a refill on your cardiac medications before your next appointment, please call your pharmacy.   [place], RN, BSN

## 2018-07-01 NOTE — Progress Notes (Signed)
Cardiology Office Note:    Date:  07/01/2018   ID:  Shawn Sullivan, DOB January 26, 1946, MRN [id number]  PCP:  Rochel Brome, MD  Cardiologist:  Shirlee More, MD    Referring MD: Rochel Brome, MD    ASSESSMENT:    1. Persistent atrial fibrillation ([place])   2. Anticoagulated   3. Hypertensive heart disease, unspecified whether heart failure present   4. Chronic kidney disease, unspecified CKD stage   5. Chronic obstructive pulmonary disease, unspecified COPD type ([place])   6. Obstructive sleep apnea    PLAN:    In order of problems listed above:  1. He has been in atrial fibrillation for weeks to months rapid rate.  Unfortunately CKD digoxin is a poor choice and with his chronic peripheral edema worsened a calcium channel blocker I will stop verapamil.  To avoid hypotension I will switch him from carvedilol to Lopressor he will check his pulse at home we will plan to do a Holter monitor in 1 week and recheck an echocardiogram for findings of left ventricular dysfunction.  At this time would be hesitant to cardioversion.  He will continue his anticoagulant 2. Continue current anticoagulant does not require dosage change for renal insufficiency 3. Stable continue current antihypertensives I asked him to stop his thiazide diuretic 4. Worsening CKD pending nephrology evaluation avoid digoxin be careful beta-blockers that tend to accumulate with renal insufficiency 5. Seems to be improved he is tolerating his present BiPAP managed by pulmonary   Next appointment: One month   Medication Adjustments/Labs and Tests Ordered: Current medicines are reviewed at length with the patient today.  Concerns regarding medicines are outlined above.  Orders Placed This Encounter  Procedures  . Holter monitor - 48 hour  . ECHOCARDIOGRAM COMPLETE   Meds ordered this encounter  Medications  . metoprolol tartrate (LOPRESSOR) 50 MG tablet    Sig: Take 1 tablet (50 mg total) by mouth 2 (two) times daily.      Dispense:  180 tablet    Refill:  3    No chief complaint on file.   History of Present Illness:    Shawn Sullivan is a 72 y.o. male with a hx of hypertension sleep apnea and COPD last seen 07/14/2016.  I was phoned yesterday by Dr. Tobie Poet he was in atrial fibrillation rate was relatively rapid at 120 bpm and was placed on a calcium channel blocker along with his beta-blocker and initiated anticoagulation with Eliquis. Echocardiogram performed 2016 showed normal left ventricular systolic function mild biatrial enlargement but he had a dilated right ventricle consistent with cor pulmonale at that time he had peripheral edema.  His proBNP level was less than 100 and is felt to be due to his lung disease and calcium channel blocker treatment.  He also has had CKD stage II Compliance with diet, lifestyle and medications: Yes  He has had increasing shortness of breath awareness of his heart racing over the last several months and persistently for several weeks.  Overall he has not done well he has been poorly tolerant of BiPAP for his obstructive sleep apnea and his daughter relates he has progressive CKD and is awaiting evaluation by nephrology.  He  is not having chest pain he is short of breath with minor activities as edema no orthopnea PND.  Previous evaluation showed findings of right heart failure related to COPD D and untreated sleep apnea.  He had labs drawn yesterday the family thought I had the results they  have not been sent to my office.  EKG performed at his PCP office yesterday showed rapid atrial fibrillation. Past Medical History:  Diagnosis Date  . Anticoagulated 07/01/2018  . Anticoagulated 07/01/2018  . Benign essential hypertension 05/06/2013  . Chronic obstructive pulmonary disease ([place]) 11/05/2015  . CKD (chronic kidney disease) stage 2, GFR 60-89 ml/min 07/01/2018  . COPD (chronic obstructive pulmonary disease) ([place]) 07/01/2018  . Dermatochalasis 12/16/2015  . Disorder of bursae  and tendons in shoulder region 01/05/2014  . Generalized edema 06/22/2016  . Hyperlipidemia 12/16/2015  . Hypertensive heart disease 07/01/2018  . Low back pain 08/17/2013  . Lumbosacral spondylosis 05/06/2013  . Obstructive sleep apnea 07/01/2018  . Persistent atrial fibrillation ([place]) 07/01/2018  . Piriformis syndrome 05/03/2014  . Postlaminectomy syndrome, lumbar region 05/06/2013  . Presbyopia of both eyes 12/16/2015  . Restless legs syndrome 03/23/2016  . Restrictive lung disease 06/22/2016  . Sleep apnea 05/06/2013  . Type 2 diabetes mellitus without complications ([place]) 02/02/92    Past Surgical History:  Procedure Laterality Date  . APPENDECTOMY    . BACK SURGERY      Current Medications: Current Meds  Medication Sig  . albuterol (PROVENTIL HFA;VENTOLIN HFA) 108 (90 Base) MCG/ACT inhaler Inhale 1-2 puffs into the lungs every 6 (six) hours as needed.  [name] apixaban (ELIQUIS) 5 MG TABS tablet Take 5 mg by mouth daily.  [name] atorvastatin (LIPITOR) 40 MG tablet Take 40 mg by mouth daily.  . Coenzyme Q10 (COQ10) 100 MG CAPS Take 100 mg by mouth daily.  . fluticasone furoate-vilanterol (BREO ELLIPTA) 100-25 MCG/INH AEPB Inhale 1 puff into the lungs daily.  [name] glipiZIDE (GLUCOTROL) 10 MG tablet Take 10 mg by mouth 2 (two) times daily.  . hydrALAZINE (APRESOLINE) 50 MG tablet Take 50 mg by mouth 2 (two) times daily.  [name] HYDROcodone-acetaminophen (NORCO) 10-325 MG tablet Take 1 tablet by mouth every 4 (four) hours as needed.  . liraglutide (VICTOZA) 18 MG/3ML SOPN Inject 1.2 mg into the skin daily.  . metFORMIN (GLUCOPHAGE) 1000 MG tablet Take 1,000 mg by mouth 2 (two) times daily.  . montelukast (SINGULAIR) 10 MG tablet Take 10 mg by mouth at bedtime.  [name] omeprazole (PRILOSEC) 20 MG capsule Take 20 mg by mouth daily.  . sertraline (ZOLOFT) 50 MG tablet Take 50 mg by mouth 2 (two) times daily.  . tamsulosin (FLOMAX) 0.4 MG CAPS capsule Take 0.4 mg by mouth daily.  . [DISCONTINUED] aspirin (GOODSENSE ASPIRIN)  325 MG tablet Take 325 mg by mouth daily.  . [DISCONTINUED] carvedilol (COREG) 25 MG tablet Take 25 mg by mouth 2 (two) times daily with a meal.  . [DISCONTINUED] chlorthalidone (HYGROTON) 50 MG tablet Take 50 mg by mouth daily.     Allergies:   Patient has no known allergies.   Social History   Socioeconomic History  . Marital status: Married    Spouse name: Not on file  . Number of children: Not on file  . Years of education: Not on file  . Highest education level: Not on file  Occupational History  . Not on file  Social Needs  . Financial resource strain: Not on file  . Food insecurity:    Worry: Not on file    Inability: Not on file  . Transportation needs:    Medical: Not on file    Non-medical: Not on file  Tobacco Use  . Smoking status: Former Research scientist (life sciences)  . Smokeless tobacco: Never Used  Substance and Sexual Activity  .  Alcohol use: Not on file  . Drug use: Not on file  . Sexual activity: Not on file  Lifestyle  . Physical activity:    Days per week: Not on file    Minutes per session: Not on file  . Stress: Not on file  Relationships  . Social connections:    Talks on phone: Not on file    Gets together: Not on file    Attends religious service: Not on file    Active member of club or organization: Not on file    Attends meetings of clubs or organizations: Not on file    Relationship status: Not on file  Other Topics Concern  . Not on file  Social History Narrative  . Not on file     Family History: The patient's family history includes Atrial fibrillation in his mother; Cancer in his mother; Heart disease in his father. ROS:   Please see the history of present illness.    All other systems reviewed and are negative.  EKGs/Labs/Other Studies Reviewed:    The following studies were reviewed today:  EKG:  EKG performed yesterday independently reviewed rapid atrial fibrillation nonspecific ST changes Labs drawn yesterday pending for my review  Recent  Labs: No results found for requested labs within last 8760 hours.  Recent Lipid Panel No results found for: CHOL, TRIG, HDL, CHOLHDL, VLDL, LDLCALC, LDLDIRECT  Physical Exam:    VS:  BP 128/72 (BP Location: Right Arm, Patient Position: Sitting, Cuff Size: Normal)   Pulse 85   Ht 6' (1.829 m)   Wt (!) 354 lb 9.6 oz (160.8 kg)   SpO2 97%   BMI 48.09 kg/m     Wt Readings from Last 3 Encounters:  07/01/18 (!) 354 lb 9.6 oz (160.8 kg)     GEN:   Quite obese sleep apnea appearance BMI approaches 50% in no acute distress HEENT: Normal NECK: No JVD; No carotid bruits LYMPHATICS: No lymphadenopathy CARDIAC: RRR, no murmurs, rubs, gallops RESPIRATORY:  Clear to auscultation without rales, wheezing or rhonchi  ABDOMEN: Soft, non-tender, non-distended MUSCULOSKELETAL:  > 4+ pityting edema bilateral to the thigh; No deformity  SKIN: Warm and dry NEUROLOGIC:  Alert and oriented x 3 PSYCHIATRIC:  Normal affect    Signed, Shirlee More, MD  07/01/2018 4:27 PM    [place]

## 2018-07-19 DIAGNOSIS — N182 Chronic kidney disease, stage 2 (mild): Secondary | ICD-10-CM | POA: Diagnosis not present

## 2018-07-19 DIAGNOSIS — E669 Obesity, unspecified: Secondary | ICD-10-CM | POA: Diagnosis not present

## 2018-07-19 DIAGNOSIS — D631 Anemia in chronic kidney disease: Secondary | ICD-10-CM | POA: Diagnosis not present

## 2018-07-19 DIAGNOSIS — N183 Chronic kidney disease, stage 3 (moderate): Secondary | ICD-10-CM | POA: Diagnosis not present

## 2018-07-19 DIAGNOSIS — I129 Hypertensive chronic kidney disease with stage 1 through stage 4 chronic kidney disease, or unspecified chronic kidney disease: Secondary | ICD-10-CM | POA: Diagnosis not present

## 2018-07-19 DIAGNOSIS — N390 Urinary tract infection, site not specified: Secondary | ICD-10-CM | POA: Diagnosis not present

## 2018-07-19 DIAGNOSIS — R600 Localized edema: Secondary | ICD-10-CM | POA: Diagnosis not present

## 2018-07-21 ENCOUNTER — Other Ambulatory Visit: Payer: Self-pay | Admitting: Nephrology

## 2018-07-21 DIAGNOSIS — E669 Obesity, unspecified: Secondary | ICD-10-CM

## 2018-07-21 DIAGNOSIS — I129 Hypertensive chronic kidney disease with stage 1 through stage 4 chronic kidney disease, or unspecified chronic kidney disease: Secondary | ICD-10-CM

## 2018-07-21 DIAGNOSIS — R600 Localized edema: Secondary | ICD-10-CM

## 2018-07-21 DIAGNOSIS — N183 Chronic kidney disease, stage 3 unspecified: Secondary | ICD-10-CM

## 2018-07-26 ENCOUNTER — Ambulatory Visit
Admission: RE | Admit: 2018-07-26 | Discharge: 2018-07-26 | Disposition: A | Discharge status: Home / Self Care | Payer: Medicare HMO | Source: Ambulatory Visit | Attending: Nephrology | Admitting: Nephrology

## 2018-07-26 DIAGNOSIS — E669 Obesity, unspecified: Secondary | ICD-10-CM

## 2018-07-26 DIAGNOSIS — N183 Chronic kidney disease, stage 3 unspecified: Secondary | ICD-10-CM

## 2018-07-26 DIAGNOSIS — I129 Hypertensive chronic kidney disease with stage 1 through stage 4 chronic kidney disease, or unspecified chronic kidney disease: Secondary | ICD-10-CM

## 2018-07-26 DIAGNOSIS — R600 Localized edema: Secondary | ICD-10-CM

## 2018-07-27 ENCOUNTER — Ambulatory Visit (INDEPENDENT_AMBULATORY_CARE_PROVIDER_SITE_OTHER): Payer: Medicare HMO

## 2018-07-27 DIAGNOSIS — I481 Persistent atrial fibrillation: Secondary | ICD-10-CM | POA: Diagnosis not present

## 2018-07-27 DIAGNOSIS — I4819 Other persistent atrial fibrillation: Secondary | ICD-10-CM

## 2018-08-11 ENCOUNTER — Ambulatory Visit (INDEPENDENT_AMBULATORY_CARE_PROVIDER_SITE_OTHER): Payer: Medicare HMO

## 2018-08-11 ENCOUNTER — Other Ambulatory Visit: Payer: Self-pay

## 2018-08-11 DIAGNOSIS — I481 Persistent atrial fibrillation: Secondary | ICD-10-CM | POA: Diagnosis not present

## 2018-08-11 DIAGNOSIS — I4819 Other persistent atrial fibrillation: Secondary | ICD-10-CM

## 2018-08-11 NOTE — Progress Notes (Signed)
Complete echocardiogram has been performed.  Jimmy Naasir Carreira RDCS 

## 2018-08-16 ENCOUNTER — Telehealth: Payer: Self-pay | Admitting: *Deleted

## 2018-08-16 DIAGNOSIS — I129 Hypertensive chronic kidney disease with stage 1 through stage 4 chronic kidney disease, or unspecified chronic kidney disease: Secondary | ICD-10-CM | POA: Diagnosis not present

## 2018-08-16 DIAGNOSIS — E669 Obesity, unspecified: Secondary | ICD-10-CM | POA: Diagnosis not present

## 2018-08-16 DIAGNOSIS — R600 Localized edema: Secondary | ICD-10-CM | POA: Diagnosis not present

## 2018-08-16 DIAGNOSIS — N183 Chronic kidney disease, stage 3 (moderate): Secondary | ICD-10-CM | POA: Diagnosis not present

## 2018-08-16 MED ORDER — METOPROLOL TARTRATE 50 MG PO TABS: 50.0000 mg | ORAL_TABLET | Freq: Three times a day (TID) | ORAL | 3 refills | Status: DC

## 2018-08-16 NOTE — Telephone Encounter (Signed)
-----   Message from Richardo Priest, MD sent at 08/16/2018  7:59 AM EDT ----- Rate remains rapid, increase his metoprolol to 50 mg TID

## 2018-08-16 NOTE — Telephone Encounter (Signed)
Patient informed of 48 hour holter monitor results and advised to increase metoprolol tartrate (lopressor) 50 mg from twice daily to three times daily. Patient verbalized understanding and denies needing medication refilled at this time. No further questions.

## 2018-08-21 NOTE — Progress Notes (Signed)
Cardiology Office Note:    Date:  08/22/2018   ID:  Shawn Sullivan, DOB [birth year], MRN [id number]  PCP:  Rochel Brome, MD  Cardiologist:  Shirlee More, MD    Referring MD: Rochel Brome, MD    ASSESSMENT:    1. Persistent atrial fibrillation ([place])   2. Hypertensive heart disease, unspecified whether heart failure present   3. Stage 3 chronic kidney disease ([place])   4. Chronic obstructive pulmonary disease, unspecified COPD type ([place])   5. Obstructive sleep apnea   6. Anticoagulated    PLAN:    In order of problems listed above:  Ventricular rate is better controlled and has settled in the range of 100 -120bpm.  We discussed cardioversion he is hesitant I asked his daughter to purchase the iPhone adapter to record heart rhythms and get a better sense of heart rate control and plan to see him back in the office in 6 months.  With his severe underlying lung disease persistent atrial fibrillation and biatrial enlargement I think he be a poor candidate to maintain sinus rhythm. Improved blood pressure in the range tolerating beta-blocker metoprolol which is effectively controlled for age Stable managed by his PCP Stable managed by pulmonary Stable managed by pulmonary He tolerates his anticoagulant will continue the same his stroke risk is moderate.   Next appointment: 6 weeks   Medication Adjustments/Labs and Tests Ordered: Current medicines are reviewed at length with the patient today.  Concerns regarding medicines are outlined above.  No orders of the defined types were placed in this encounter.  No orders of the defined types were placed in this encounter.   Chief Complaint  Patient presents with  . Follow-up     for AF with a rapid rate    History of Present Illness:    Shawn Sullivan is a 72 y.o. male with a hx of persistent atrial fibrillation hypertension chronic kidney disease 3 COPD and obstructive sleep apnea followed by [facility], PA-C   last seen 07/01/2018.  Follow-up Holter monitor showed atrial fibrillation with rapid response his beta-blocker dosage was increased.  He was intolerant of calcium channel blockers with worsened edema and was a poor candidate for digoxin and severe CKD. Compliance with diet, lifestyle and medications: Yes  Heart rate is better controlled at times less than 100 but seems to have settled in at 100-120 bpm using a pulse oximeter.  His shortness of breath is unchanged with any activities related to his COPD he has had no chest pain TIA or bleeding complication of his anticoagulant.  We discussed the potential for cardioversion he is hesitant will utilize a smart phone adapter to record heart rhythms at home and if at this time continue single drug metoprolol for heart rate control and anticoagulation. Past Medical History:  Diagnosis Date  . Anticoagulated 07/01/2018  . Anticoagulated 07/01/2018  . Benign essential hypertension 05/06/2013  . Chronic obstructive pulmonary disease ([place]) 11/05/2015  . CKD (chronic kidney disease) stage 2, GFR 60-89 ml/min 07/01/2018  . COPD (chronic obstructive pulmonary disease) ([place]) 07/01/2018  . Dermatochalasis 12/16/2015  . Disorder of bursae and tendons in shoulder region 01/05/2014  . Generalized edema 06/22/2016  . Hyperlipidemia 12/16/2015  . Hypertensive heart disease 07/01/2018  . Low back pain 08/17/2013  . Lumbosacral spondylosis 05/06/2013  . Obstructive sleep apnea 07/01/2018  . Persistent atrial fibrillation ([place]) 07/01/2018  . Piriformis syndrome 05/03/2014  . Postlaminectomy syndrome, lumbar region 05/06/2013  . Presbyopia of both eyes  12/16/2015  . Restless legs syndrome 03/23/2016  . Restrictive lung disease 06/22/2016  . Sleep apnea 05/06/2013  . Type 2 diabetes mellitus without complications ([place]) 69/05/2951    Past Surgical History:  Procedure Laterality Date  . APPENDECTOMY    . BACK SURGERY      Current Medications: Current Meds  Medication Sig  . albuterol  (PROVENTIL HFA;VENTOLIN HFA) 108 (90 Base) MCG/ACT inhaler Inhale 1-2 puffs into the lungs every 6 (six) hours as needed.  [name] apixaban (ELIQUIS) 5 MG TABS tablet Take 5 mg by mouth 2 (two) times daily.   . Cholecalciferol (VITAMIN D3) 1000 units CAPS Take 1 capsule by mouth daily.  . Coenzyme Q10 (COQ10) 100 MG CAPS Take 100 mg by mouth daily.  . Ferrous Sulfate (IRON) 325 (65 Fe) MG TABS Take 1 tablet by mouth daily.  . fluticasone furoate-vilanterol (BREO ELLIPTA) 100-25 MCG/INH AEPB Inhale 1 puff into the lungs daily.  [name] glipiZIDE (GLUCOTROL) 10 MG tablet Take 10 mg by mouth 2 (two) times daily.  [name] HYDROcodone-acetaminophen (NORCO) 10-325 MG tablet Take 1 tablet by mouth every 4 (four) hours as needed.  . liraglutide (VICTOZA) 18 MG/3ML SOPN Inject 1.2 mg into the skin daily.  . metoprolol tartrate (LOPRESSOR) 50 MG tablet Take 1 tablet (50 mg total) by mouth 3 (three) times daily.  . montelukast (SINGULAIR) 10 MG tablet Take 10 mg by mouth at bedtime.  . Multiple Vitamin (MULTIVITAMIN) tablet Take 1 tablet by mouth daily.  [name] omeprazole (PRILOSEC) 20 MG capsule Take 20 mg by mouth daily.  . potassium chloride SA (K-DUR,KLOR-CON) 20 MEQ tablet Take 20 mEq by mouth daily.  . sertraline (ZOLOFT) 50 MG tablet Take 50 mg by mouth 2 (two) times daily.  . tamsulosin (FLOMAX) 0.4 MG CAPS capsule Take 0.4 mg by mouth daily.  . Thiamine HCl (VITAMIN B1 PO) Take 1 tablet by mouth daily.  [name] torsemide (DEMADEX) 20 MG tablet Take 50 tablets by mouth 2 (two) times daily.   . Turmeric 500 MG TABS Take 1 tablet by mouth 2 (two) times daily.  . vitamin B-12 (CYANOCOBALAMIN) 1000 MCG tablet Take 1,000 mcg by mouth daily.     Allergies:   Patient has no known allergies.   Social History   Socioeconomic History  . Marital status: Married    Spouse name: Not on file  . Number of children: Not on file  . Years of education: Not on file  . Highest education level: Not on file  Occupational History  . Not  on file  Social Needs  . Financial resource strain: Not on file  . Food insecurity:    Worry: Not on file    Inability: Not on file  . Transportation needs:    Medical: Not on file    Non-medical: Not on file  Tobacco Use  . Smoking status: Former Research scientist (life sciences)  . Smokeless tobacco: Never Used  Substance and Sexual Activity  . Alcohol use: Not Currently    Frequency: Never  . Drug use: Not Currently  . Sexual activity: Not on file  Lifestyle  . Physical activity:    Days per week: Not on file    Minutes per session: Not on file  . Stress: Not on file  Relationships  . Social connections:    Talks on phone: Not on file    Gets together: Not on file    Attends religious service: Not on file    Active member of club or organization:  Not on file    Attends meetings of clubs or organizations: Not on file    Relationship status: Not on file  Other Topics Concern  . Not on file  Social History Narrative  . Not on file     Family History: The patient's family history includes Atrial fibrillation in his mother; Cancer in his mother; Heart disease in his father. ROS:   Please see the history of present illness.    All other systems reviewed and are negative.  EKGs/Labs/Other Studies Reviewed:    The following studies were reviewed today:  Echo 08/11/18: EF 55-60% moderate LAE mild to moderate RAE, mild AS, ascending Aorta dilated 41 mm  Recent Labs: No results found for requested labs within last 8760 hours.  Recent Lipid Panel No results found for: CHOL, TRIG, HDL, CHOLHDL, VLDL, LDLCALC, LDLDIRECT  Physical Exam:    VS:  BP 130/82 (BP Location: Left Arm, Patient Position: Sitting, Cuff Size: Large)   Pulse 92   Ht 5\' 10"  (1.778 m)   Wt (!) 347 lb 3.2 oz (157.5 kg)   SpO2 93%   BMI 49.82 kg/m     Wt Readings from Last 3 Encounters:  08/22/18 (!) 347 lb 3.2 oz (157.5 kg)  07/01/18 (!) 354 lb 9.6 oz (160.8 kg)     GEN: Quite obese COPD habitus well nourished, well  developed in no acute distress HEENT: Normal NECK: No JVD; No carotid bruits LYMPHATICS: No lymphadenopathy CARDIAC: Distant heart sounds is not wheezing RRR, no murmurs, rubs, gallops RESPIRATORY:  Clear to auscultation with decreased BS ABDOMEN: Soft, non-tender, non-distended MUSCULOSKELETAL:  No edema; No deformity  SKIN: Warm and dry NEUROLOGIC:  Alert and oriented x 3 PSYCHIATRIC:  Normal affect    Signed, Shirlee More, MD  08/22/2018 3:10 PM    [facility]

## 2018-08-22 ENCOUNTER — Encounter: Payer: Self-pay | Admitting: Cardiology

## 2018-08-22 ENCOUNTER — Ambulatory Visit: Payer: Medicare HMO | Admitting: Cardiology

## 2018-08-22 VITALS — BP 130/82 | HR 92 | Ht 70.0 in | Wt 347.2 lb

## 2018-08-22 DIAGNOSIS — I481 Persistent atrial fibrillation: Secondary | ICD-10-CM | POA: Diagnosis not present

## 2018-08-22 DIAGNOSIS — G4733 Obstructive sleep apnea (adult) (pediatric): Secondary | ICD-10-CM

## 2018-08-22 DIAGNOSIS — Z7901 Long term (current) use of anticoagulants: Secondary | ICD-10-CM

## 2018-08-22 DIAGNOSIS — I4819 Other persistent atrial fibrillation: Secondary | ICD-10-CM

## 2018-08-22 DIAGNOSIS — J449 Chronic obstructive pulmonary disease, unspecified: Secondary | ICD-10-CM | POA: Diagnosis not present

## 2018-08-22 DIAGNOSIS — I119 Hypertensive heart disease without heart failure: Secondary | ICD-10-CM | POA: Diagnosis not present

## 2018-08-22 DIAGNOSIS — N183 Chronic kidney disease, stage 3 unspecified: Secondary | ICD-10-CM

## 2018-08-22 NOTE — Patient Instructions (Addendum)
Medication Instructions:  Your physician recommends that you continue on your current medications as directed. Please refer to the Current Medication list given to you today.   Labwork: None  Testing/Procedures: None  Follow-Up: Your physician recommends that you schedule a follow-up appointment in: 6 weeks.   **Please purchase the Kardia app on your phone and record an EKG daily.   If you need a refill on your cardiac medications before your next appointment, please call your pharmacy.   Thank you for choosing [facility]! Robyne Peers, RN [phone]      [email or web address] [email or web address]        FDA-cleared, clinical grade mobile EKG monitor: Jodelle Red is the most clinically-validated mobile EKG used by the world's leading cardiac care medical professionals With Basic service, know instantly if your heart rhythm is normal or if atrial fibrillation is detected, and email the last single EKG recording to yourself or your doctor Premium service, available for purchase through the Kardia app for $9.99 per month or $99 per year, includes unlimited history and storage of your EKG recordings, a monthly EKG summary report to share with your doctor, along with the ability to track your blood pressure, activity and weight Includes one KardiaMobile phone clip FREE SHIPPING: Standard delivery 1-3 business days. Orders placed by 11:00am PST will ship that afternoon. Otherwise, will ship next business day. All orders ship via [employer] from [place], [place]

## 2018-08-26 DIAGNOSIS — N184 Chronic kidney disease, stage 4 (severe): Secondary | ICD-10-CM | POA: Diagnosis not present

## 2018-08-26 DIAGNOSIS — R0602 Shortness of breath: Secondary | ICD-10-CM | POA: Diagnosis not present

## 2018-08-26 DIAGNOSIS — E1121 Type 2 diabetes mellitus with diabetic nephropathy: Secondary | ICD-10-CM | POA: Diagnosis not present

## 2018-08-26 DIAGNOSIS — R600 Localized edema: Secondary | ICD-10-CM | POA: Diagnosis not present

## 2018-08-26 DIAGNOSIS — E1142 Type 2 diabetes mellitus with diabetic polyneuropathy: Secondary | ICD-10-CM | POA: Diagnosis not present

## 2018-08-26 DIAGNOSIS — E782 Mixed hyperlipidemia: Secondary | ICD-10-CM | POA: Diagnosis not present

## 2018-08-26 DIAGNOSIS — E559 Vitamin D deficiency, unspecified: Secondary | ICD-10-CM | POA: Diagnosis not present

## 2018-08-26 DIAGNOSIS — I10 Essential (primary) hypertension: Secondary | ICD-10-CM | POA: Diagnosis not present

## 2018-08-26 DIAGNOSIS — Z23 Encounter for immunization: Secondary | ICD-10-CM | POA: Diagnosis not present

## 2018-08-26 DIAGNOSIS — I481 Persistent atrial fibrillation: Secondary | ICD-10-CM | POA: Diagnosis not present

## 2018-09-01 DIAGNOSIS — N183 Chronic kidney disease, stage 3 (moderate): Secondary | ICD-10-CM | POA: Diagnosis not present

## 2018-09-09 DIAGNOSIS — E871 Hypo-osmolality and hyponatremia: Secondary | ICD-10-CM | POA: Diagnosis not present

## 2018-09-09 DIAGNOSIS — E1165 Type 2 diabetes mellitus with hyperglycemia: Secondary | ICD-10-CM | POA: Diagnosis not present

## 2018-09-09 DIAGNOSIS — R05 Cough: Secondary | ICD-10-CM | POA: Diagnosis not present

## 2018-09-09 DIAGNOSIS — I10 Essential (primary) hypertension: Secondary | ICD-10-CM | POA: Diagnosis not present

## 2018-09-09 DIAGNOSIS — N281 Cyst of kidney, acquired: Secondary | ICD-10-CM | POA: Diagnosis not present

## 2018-09-09 DIAGNOSIS — E119 Type 2 diabetes mellitus without complications: Secondary | ICD-10-CM | POA: Diagnosis not present

## 2018-09-09 DIAGNOSIS — I4891 Unspecified atrial fibrillation: Secondary | ICD-10-CM | POA: Diagnosis not present

## 2018-09-09 DIAGNOSIS — N289 Disorder of kidney and ureter, unspecified: Secondary | ICD-10-CM | POA: Diagnosis not present

## 2018-09-09 DIAGNOSIS — R5381 Other malaise: Secondary | ICD-10-CM | POA: Diagnosis not present

## 2018-09-09 DIAGNOSIS — E876 Hypokalemia: Secondary | ICD-10-CM | POA: Diagnosis not present

## 2018-09-09 DIAGNOSIS — D72829 Elevated white blood cell count, unspecified: Secondary | ICD-10-CM | POA: Diagnosis not present

## 2018-09-09 DIAGNOSIS — R739 Hyperglycemia, unspecified: Secondary | ICD-10-CM | POA: Diagnosis not present

## 2018-09-10 DIAGNOSIS — N281 Cyst of kidney, acquired: Secondary | ICD-10-CM | POA: Diagnosis not present

## 2018-09-10 DIAGNOSIS — B356 Tinea cruris: Secondary | ICD-10-CM | POA: Diagnosis not present

## 2018-09-10 DIAGNOSIS — E871 Hypo-osmolality and hyponatremia: Secondary | ICD-10-CM | POA: Diagnosis not present

## 2018-09-10 DIAGNOSIS — J449 Chronic obstructive pulmonary disease, unspecified: Secondary | ICD-10-CM | POA: Diagnosis not present

## 2018-09-10 DIAGNOSIS — N179 Acute kidney failure, unspecified: Secondary | ICD-10-CM | POA: Diagnosis not present

## 2018-09-10 DIAGNOSIS — E876 Hypokalemia: Secondary | ICD-10-CM | POA: Diagnosis not present

## 2018-09-10 DIAGNOSIS — E119 Type 2 diabetes mellitus without complications: Secondary | ICD-10-CM | POA: Diagnosis not present

## 2018-09-10 DIAGNOSIS — I130 Hypertensive heart and chronic kidney disease with heart failure and stage 1 through stage 4 chronic kidney disease, or unspecified chronic kidney disease: Secondary | ICD-10-CM | POA: Diagnosis not present

## 2018-09-10 DIAGNOSIS — R5381 Other malaise: Secondary | ICD-10-CM | POA: Diagnosis not present

## 2018-09-10 DIAGNOSIS — E1165 Type 2 diabetes mellitus with hyperglycemia: Secondary | ICD-10-CM | POA: Diagnosis not present

## 2018-09-10 DIAGNOSIS — R05 Cough: Secondary | ICD-10-CM | POA: Diagnosis not present

## 2018-09-10 DIAGNOSIS — N289 Disorder of kidney and ureter, unspecified: Secondary | ICD-10-CM | POA: Diagnosis not present

## 2018-09-10 DIAGNOSIS — I5033 Acute on chronic diastolic (congestive) heart failure: Secondary | ICD-10-CM | POA: Diagnosis not present

## 2018-09-10 DIAGNOSIS — Z6841 Body Mass Index (BMI) 40.0 and over, adult: Secondary | ICD-10-CM | POA: Diagnosis not present

## 2018-09-13 DIAGNOSIS — E876 Hypokalemia: Secondary | ICD-10-CM | POA: Diagnosis not present

## 2018-09-13 DIAGNOSIS — E1165 Type 2 diabetes mellitus with hyperglycemia: Secondary | ICD-10-CM | POA: Diagnosis not present

## 2018-09-13 DIAGNOSIS — Z6841 Body Mass Index (BMI) 40.0 and over, adult: Secondary | ICD-10-CM | POA: Diagnosis not present

## 2018-09-13 DIAGNOSIS — I129 Hypertensive chronic kidney disease with stage 1 through stage 4 chronic kidney disease, or unspecified chronic kidney disease: Secondary | ICD-10-CM | POA: Diagnosis not present

## 2018-09-13 DIAGNOSIS — E1122 Type 2 diabetes mellitus with diabetic chronic kidney disease: Secondary | ICD-10-CM | POA: Diagnosis not present

## 2018-09-13 DIAGNOSIS — N183 Chronic kidney disease, stage 3 (moderate): Secondary | ICD-10-CM | POA: Diagnosis not present

## 2018-09-13 DIAGNOSIS — N178 Other acute kidney failure: Secondary | ICD-10-CM | POA: Diagnosis not present

## 2018-09-13 DIAGNOSIS — E669 Obesity, unspecified: Secondary | ICD-10-CM | POA: Diagnosis not present

## 2018-09-13 DIAGNOSIS — R600 Localized edema: Secondary | ICD-10-CM | POA: Diagnosis not present

## 2018-09-14 DIAGNOSIS — E1165 Type 2 diabetes mellitus with hyperglycemia: Secondary | ICD-10-CM | POA: Diagnosis not present

## 2018-09-14 DIAGNOSIS — J449 Chronic obstructive pulmonary disease, unspecified: Secondary | ICD-10-CM | POA: Diagnosis not present

## 2018-09-14 DIAGNOSIS — N179 Acute kidney failure, unspecified: Secondary | ICD-10-CM | POA: Diagnosis not present

## 2018-09-14 DIAGNOSIS — I4891 Unspecified atrial fibrillation: Secondary | ICD-10-CM | POA: Diagnosis not present

## 2018-09-14 DIAGNOSIS — E876 Hypokalemia: Secondary | ICD-10-CM | POA: Diagnosis not present

## 2018-09-14 DIAGNOSIS — E669 Obesity, unspecified: Secondary | ICD-10-CM | POA: Diagnosis not present

## 2018-09-14 DIAGNOSIS — I10 Essential (primary) hypertension: Secondary | ICD-10-CM | POA: Diagnosis not present

## 2018-09-14 DIAGNOSIS — Z7901 Long term (current) use of anticoagulants: Secondary | ICD-10-CM | POA: Diagnosis not present

## 2018-09-14 DIAGNOSIS — M1991 Primary osteoarthritis, unspecified site: Secondary | ICD-10-CM | POA: Diagnosis not present

## 2018-09-14 DIAGNOSIS — Z7984 Long term (current) use of oral hypoglycemic drugs: Secondary | ICD-10-CM | POA: Diagnosis not present

## 2018-09-15 DIAGNOSIS — M1991 Primary osteoarthritis, unspecified site: Secondary | ICD-10-CM | POA: Diagnosis not present

## 2018-09-15 DIAGNOSIS — I10 Essential (primary) hypertension: Secondary | ICD-10-CM | POA: Diagnosis not present

## 2018-09-15 DIAGNOSIS — E1165 Type 2 diabetes mellitus with hyperglycemia: Secondary | ICD-10-CM | POA: Diagnosis not present

## 2018-09-15 DIAGNOSIS — Z7984 Long term (current) use of oral hypoglycemic drugs: Secondary | ICD-10-CM | POA: Diagnosis not present

## 2018-09-15 DIAGNOSIS — Z7901 Long term (current) use of anticoagulants: Secondary | ICD-10-CM | POA: Diagnosis not present

## 2018-09-15 DIAGNOSIS — E669 Obesity, unspecified: Secondary | ICD-10-CM | POA: Diagnosis not present

## 2018-09-15 DIAGNOSIS — N179 Acute kidney failure, unspecified: Secondary | ICD-10-CM | POA: Diagnosis not present

## 2018-09-15 DIAGNOSIS — J449 Chronic obstructive pulmonary disease, unspecified: Secondary | ICD-10-CM | POA: Diagnosis not present

## 2018-09-15 DIAGNOSIS — E876 Hypokalemia: Secondary | ICD-10-CM | POA: Diagnosis not present

## 2018-09-15 DIAGNOSIS — I4891 Unspecified atrial fibrillation: Secondary | ICD-10-CM | POA: Diagnosis not present

## 2018-09-20 DIAGNOSIS — M1991 Primary osteoarthritis, unspecified site: Secondary | ICD-10-CM | POA: Diagnosis not present

## 2018-09-20 DIAGNOSIS — N179 Acute kidney failure, unspecified: Secondary | ICD-10-CM | POA: Diagnosis not present

## 2018-09-20 DIAGNOSIS — I10 Essential (primary) hypertension: Secondary | ICD-10-CM | POA: Diagnosis not present

## 2018-09-20 DIAGNOSIS — E1165 Type 2 diabetes mellitus with hyperglycemia: Secondary | ICD-10-CM | POA: Diagnosis not present

## 2018-09-20 DIAGNOSIS — I4891 Unspecified atrial fibrillation: Secondary | ICD-10-CM | POA: Diagnosis not present

## 2018-09-20 DIAGNOSIS — E876 Hypokalemia: Secondary | ICD-10-CM | POA: Diagnosis not present

## 2018-09-20 DIAGNOSIS — J449 Chronic obstructive pulmonary disease, unspecified: Secondary | ICD-10-CM | POA: Diagnosis not present

## 2018-09-20 DIAGNOSIS — Z7984 Long term (current) use of oral hypoglycemic drugs: Secondary | ICD-10-CM | POA: Diagnosis not present

## 2018-09-20 DIAGNOSIS — Z7901 Long term (current) use of anticoagulants: Secondary | ICD-10-CM | POA: Diagnosis not present

## 2018-09-20 DIAGNOSIS — E669 Obesity, unspecified: Secondary | ICD-10-CM | POA: Diagnosis not present

## 2018-09-21 DIAGNOSIS — E1165 Type 2 diabetes mellitus with hyperglycemia: Secondary | ICD-10-CM | POA: Diagnosis not present

## 2018-09-21 DIAGNOSIS — I10 Essential (primary) hypertension: Secondary | ICD-10-CM | POA: Diagnosis not present

## 2018-09-21 DIAGNOSIS — N179 Acute kidney failure, unspecified: Secondary | ICD-10-CM | POA: Diagnosis not present

## 2018-09-21 DIAGNOSIS — J449 Chronic obstructive pulmonary disease, unspecified: Secondary | ICD-10-CM | POA: Diagnosis not present

## 2018-09-22 DIAGNOSIS — R600 Localized edema: Secondary | ICD-10-CM | POA: Diagnosis not present

## 2018-09-22 DIAGNOSIS — E1165 Type 2 diabetes mellitus with hyperglycemia: Secondary | ICD-10-CM | POA: Diagnosis not present

## 2018-09-22 DIAGNOSIS — G4733 Obstructive sleep apnea (adult) (pediatric): Secondary | ICD-10-CM | POA: Diagnosis not present

## 2018-09-22 DIAGNOSIS — I4821 Permanent atrial fibrillation: Secondary | ICD-10-CM | POA: Diagnosis not present

## 2018-09-22 DIAGNOSIS — E1142 Type 2 diabetes mellitus with diabetic polyneuropathy: Secondary | ICD-10-CM | POA: Diagnosis not present

## 2018-09-22 DIAGNOSIS — Z7901 Long term (current) use of anticoagulants: Secondary | ICD-10-CM | POA: Diagnosis not present

## 2018-09-22 DIAGNOSIS — M1991 Primary osteoarthritis, unspecified site: Secondary | ICD-10-CM | POA: Diagnosis not present

## 2018-09-22 DIAGNOSIS — N184 Chronic kidney disease, stage 4 (severe): Secondary | ICD-10-CM | POA: Diagnosis not present

## 2018-09-22 DIAGNOSIS — E669 Obesity, unspecified: Secondary | ICD-10-CM | POA: Diagnosis not present

## 2018-09-22 DIAGNOSIS — I4891 Unspecified atrial fibrillation: Secondary | ICD-10-CM | POA: Diagnosis not present

## 2018-09-22 DIAGNOSIS — N179 Acute kidney failure, unspecified: Secondary | ICD-10-CM | POA: Diagnosis not present

## 2018-09-22 DIAGNOSIS — R69 Illness, unspecified: Secondary | ICD-10-CM | POA: Diagnosis not present

## 2018-09-22 DIAGNOSIS — Z7984 Long term (current) use of oral hypoglycemic drugs: Secondary | ICD-10-CM | POA: Diagnosis not present

## 2018-09-22 DIAGNOSIS — E782 Mixed hyperlipidemia: Secondary | ICD-10-CM | POA: Diagnosis not present

## 2018-09-22 DIAGNOSIS — J449 Chronic obstructive pulmonary disease, unspecified: Secondary | ICD-10-CM | POA: Diagnosis not present

## 2018-09-22 DIAGNOSIS — I10 Essential (primary) hypertension: Secondary | ICD-10-CM | POA: Diagnosis not present

## 2018-09-22 DIAGNOSIS — E876 Hypokalemia: Secondary | ICD-10-CM | POA: Diagnosis not present

## 2018-09-23 DIAGNOSIS — E876 Hypokalemia: Secondary | ICD-10-CM | POA: Diagnosis not present

## 2018-09-27 DIAGNOSIS — J449 Chronic obstructive pulmonary disease, unspecified: Secondary | ICD-10-CM | POA: Diagnosis not present

## 2018-09-27 DIAGNOSIS — E876 Hypokalemia: Secondary | ICD-10-CM | POA: Diagnosis not present

## 2018-09-27 DIAGNOSIS — Z7984 Long term (current) use of oral hypoglycemic drugs: Secondary | ICD-10-CM | POA: Diagnosis not present

## 2018-09-27 DIAGNOSIS — I4891 Unspecified atrial fibrillation: Secondary | ICD-10-CM | POA: Diagnosis not present

## 2018-09-27 DIAGNOSIS — M1991 Primary osteoarthritis, unspecified site: Secondary | ICD-10-CM | POA: Diagnosis not present

## 2018-09-27 DIAGNOSIS — N179 Acute kidney failure, unspecified: Secondary | ICD-10-CM | POA: Diagnosis not present

## 2018-09-27 DIAGNOSIS — I10 Essential (primary) hypertension: Secondary | ICD-10-CM | POA: Diagnosis not present

## 2018-09-27 DIAGNOSIS — Z7901 Long term (current) use of anticoagulants: Secondary | ICD-10-CM | POA: Diagnosis not present

## 2018-09-27 DIAGNOSIS — E669 Obesity, unspecified: Secondary | ICD-10-CM | POA: Diagnosis not present

## 2018-09-27 DIAGNOSIS — E1165 Type 2 diabetes mellitus with hyperglycemia: Secondary | ICD-10-CM | POA: Diagnosis not present

## 2018-10-03 NOTE — Progress Notes (Signed)
Cardiology Office Note:    Date:  10/04/2018   ID:  JIMMYLEE Sullivan, DOB Jun 26, 1946, MRN [id number]  PCP:  Rochel Brome, MD  Cardiologist:  Shirlee More, MD    Referring MD: Rochel Brome, MD    ASSESSMENT:    1. Persistent atrial fibrillation   2. Hypertensive heart disease with chronic diastolic congestive heart failure ([place])   3. Hypokalemia   4. Anticoagulated    PLAN:    In order of problems listed above:  1. Continue to be rapid increase his dose of beta-blocker continue anticoagulation and pursue cardioversion once his electrolyte abnormalities resolved.  If not for low potassium at add amiodarone now rate control.  Kidney failure makes him a poor candidate for digoxin and peripheral edema may calcium channel blocker is a poor choice 2. Improved decrease his loop diuretic 50% and stop his proximal diuretic Zaroxolyn contributing to his profound hypokalemia 3. Ongoing at risk for low magnesium recheck labs today stop his proximal diuretic decrease his loop diuretic 4. Continue his anticoagulant   Next appointment: 6 weeks   Medication Adjustments/Labs and Tests Ordered: Current medicines are reviewed at length with the patient today.  Concerns regarding medicines are outlined above.  No orders of the defined types were placed in this encounter.  No orders of the defined types were placed in this encounter.   No chief complaint on file.   History of Present Illness:    Shawn Sullivan is a 72 y.o. male with a hx of persistent atrial fibrillation hypertension chronic kidney disease 3 COPD and obstructive sleep apnea last seen by me 08/22/18. Holter monitor showed atrial fibrillation with rapid response his beta-blocker dosage was increased. He was intolerant of calcium channel blockers with worsened edema and was a poor candidate for digoxin and severe CKD. Compliance with diet, lifestyle and medications: Yes  Unfortunately he was at [facility] with  diabetes out-of-control potassium at 2.5 and heart failure and I did not have records.  We click quickly were able to get a copy his potassium gone out the door was 3.1 he did not have his magnesium checked he was continued on both proximal diuretics Zaroxolyn and loop diuretic torsemide and his daughter tells me his potassium remains less than 3.  I am trying to access labs that were done at [facility] 2 weeks ago I will have him stop his metolazone continue his potassium supplements increase his beta-blocker the A. fib that continues at rates of 150 or greater with a home iPhone adapter measuring heart rhythm and defer consideration of cardioversion until his electrolytes are controlled.  Complains of diffuse weakness but no edema shortness of breath or syncope. Past Medical History:  Diagnosis Date  . Anticoagulated 07/01/2018  . Anticoagulated 07/01/2018  . Benign essential hypertension 05/06/2013  . Chronic obstructive pulmonary disease ([place]) 11/05/2015  . CKD (chronic kidney disease) stage 2, GFR 60-89 ml/min 07/01/2018  . COPD (chronic obstructive pulmonary disease) ([place]) 07/01/2018  . Dermatochalasis 12/16/2015  . Disorder of bursae and tendons in shoulder region 01/05/2014  . Generalized edema 06/22/2016  . Hyperlipidemia 12/16/2015  . Hypertensive heart disease 07/01/2018  . Low back pain 08/17/2013  . Lumbosacral spondylosis 05/06/2013  . Obstructive sleep apnea 07/01/2018  . Persistent atrial fibrillation ([place]) 07/01/2018  . Piriformis syndrome 05/03/2014  . Postlaminectomy syndrome, lumbar region 05/06/2013  . Presbyopia of both eyes 12/16/2015  . Restless legs syndrome 03/23/2016  . Restrictive lung disease 06/22/2016  . Sleep apnea 05/06/2013  .  Type 2 diabetes mellitus without complications ([place]) 53/05/6439    Past Surgical History:  Procedure Laterality Date  . APPENDECTOMY    . BACK SURGERY      Current Medications: Current Meds  Medication Sig  . albuterol (PROVENTIL HFA;VENTOLIN HFA) 108 (90 Base)  MCG/ACT inhaler Inhale 1-2 puffs into the lungs every 6 (six) hours as needed.  [name] apixaban (ELIQUIS) 5 MG TABS tablet Take 5 mg by mouth 2 (two) times daily.   . Cholecalciferol (VITAMIN D3) 1000 units CAPS Take 1 capsule by mouth daily.  [name] CINNAMON PO Take 350 mg by mouth 2 (two) times daily.  . Coenzyme Q10 (COQ10) 100 MG CAPS Take 100 mg by mouth daily.  . Ferrous Sulfate (IRON) 325 (65 Fe) MG TABS Take 1 tablet by mouth daily.  . Fluticasone-Umeclidin-Vilant (TRELEGY ELLIPTA) 100-62.5-25 MCG/INH AEPB Inhale 1 puff into the lungs daily.  [name] glipiZIDE (GLUCOTROL) 10 MG tablet Take 10 mg by mouth 2 (two) times daily.  [name] HYDROcodone-acetaminophen (NORCO) 10-325 MG tablet Take 1 tablet by mouth every 4 (four) hours as needed.  . Insulin Degludec (TRESIBA) 100 UNIT/ML SOLN Inject 36 Units into the skin daily.  [name] ipratropium-albuterol (DUONEB) 0.5-2.5 (3) MG/3ML SOLN Take 3 mLs by nebulization every 6 (six) hours as needed.  . metolazone (ZAROXOLYN) 2.5 MG tablet TAKE 1 TABLET BY MOUTH TWICE A WEEK  . metoprolol tartrate (LOPRESSOR) 50 MG tablet Take 1 tablet (50 mg total) by mouth 3 (three) times daily.  . montelukast (SINGULAIR) 10 MG tablet Take 10 mg by mouth at bedtime.  [name] omeprazole (PRILOSEC) 20 MG capsule Take 20 mg by mouth 2 (two) times daily before a meal.   . potassium chloride SA (K-DUR,KLOR-CON) 20 MEQ tablet Take 40 mEq by mouth 2 (two) times daily.   . sertraline (ZOLOFT) 50 MG tablet Take 50 mg by mouth 2 (two) times daily.  . tamsulosin (FLOMAX) 0.4 MG CAPS capsule Take 0.4 mg by mouth daily.  . Thiamine HCl (VITAMIN B1 PO) Take 1 tablet by mouth daily.  [name] torsemide (DEMADEX) 100 MG tablet Take 50 tablets by mouth 2 (two) times daily.   . Turmeric 500 MG TABS Take 1 tablet by mouth 2 (two) times daily.  . vitamin B-12 (CYANOCOBALAMIN) 1000 MCG tablet Take 1,000 mcg by mouth daily.     Allergies:   Patient has no known allergies.   Social History   Socioeconomic History  .  Marital status: Married    Spouse name: Not on file  . Number of children: Not on file  . Years of education: Not on file  . Highest education level: Not on file  Occupational History  . Not on file  Social Needs  . Financial resource strain: Not on file  . Food insecurity:    Worry: Not on file    Inability: Not on file  . Transportation needs:    Medical: Not on file    Non-medical: Not on file  Tobacco Use  . Smoking status: Former Research scientist (life sciences)  . Smokeless tobacco: Never Used  Substance and Sexual Activity  . Alcohol use: Not Currently    Frequency: Never  . Drug use: Not Currently  . Sexual activity: Not on file  Lifestyle  . Physical activity:    Days per week: Not on file    Minutes per session: Not on file  . Stress: Not on file  Relationships  . Social connections:    Talks on phone: Not on file  Gets together: Not on file    Attends religious service: Not on file    Active member of club or organization: Not on file    Attends meetings of clubs or organizations: Not on file    Relationship status: Not on file  Other Topics Concern  . Not on file  Social History Narrative  . Not on file     Family History: The patient's family history includes Atrial fibrillation in his mother; Cancer in his mother; Heart disease in his father. ROS:   Please see the history of present illness.    All other systems reviewed and are negative.  EKGs/Labs/Other Studies Reviewed:    The following studies were reviewed today:  EKG: I reviewed his home rhythm strips he is in rapid A. fib rates greater than 150 and repeated one in the office with a heart rate of 150 atrial fibrillation  Recent Labs: No results found for requested labs within last 8760 hours.  Recent Lipid Panel No results found for: CHOL, TRIG, HDL, CHOLHDL, VLDL, LDLCALC, LDLDIRECT  Physical Exam:    VS:  BP 128/68 (BP Location: Left Arm, Patient Position: Sitting, Cuff Size: Large)   Pulse 68   Ht 5\' 10"   (1.778 m)   Wt (!) 312 lb 6.4 oz (141.7 kg)   SpO2 95%   BMI 44.82 kg/m     Wt Readings from Last 3 Encounters:  10/04/18 (!) 312 lb 6.4 oz (141.7 kg)  08/22/18 (!) 347 lb 3.2 oz (157.5 kg)  07/01/18 (!) 354 lb 9.6 oz (160.8 kg)     GEN:  Well nourished, well developed in no acute distress HEENT: Normal NECK: No JVD; No carotid bruits LYMPHATICS: No lymphadenopathy CARDIAC: Rapid irregular variable first heart soundno murmurs, rubs, gallops RESPIRATORY:  Clear to auscultation without rales, wheezing or rhonchi  ABDOMEN: Soft, non-tender, non-distended MUSCULOSKELETAL:  No edema; No deformity  SKIN: Warm and dry NEUROLOGIC:  Alert and oriented x 3 PSYCHIATRIC:  Normal affect    Signed, Shirlee More, MD  10/04/2018 3:31 PM    [facility]

## 2018-10-04 ENCOUNTER — Ambulatory Visit (INDEPENDENT_AMBULATORY_CARE_PROVIDER_SITE_OTHER): Payer: Medicare HMO | Admitting: Cardiology

## 2018-10-04 VITALS — BP 128/68 | HR 68 | Ht 70.0 in | Wt 312.4 lb

## 2018-10-04 DIAGNOSIS — I5032 Chronic diastolic (congestive) heart failure: Secondary | ICD-10-CM

## 2018-10-04 DIAGNOSIS — I4819 Other persistent atrial fibrillation: Secondary | ICD-10-CM

## 2018-10-04 DIAGNOSIS — E876 Hypokalemia: Secondary | ICD-10-CM | POA: Insufficient documentation

## 2018-10-04 DIAGNOSIS — I110 Hypertensive heart disease with heart failure: Secondary | ICD-10-CM

## 2018-10-04 DIAGNOSIS — Z7901 Long term (current) use of anticoagulants: Secondary | ICD-10-CM | POA: Diagnosis not present

## 2018-10-04 HISTORY — DX: Hypokalemia: E87.6

## 2018-10-04 MED ORDER — METOPROLOL TARTRATE 50 MG PO TABS: 50.0000 mg | ORAL_TABLET | Freq: Four times a day (QID) | ORAL | 3 refills | Status: DC

## 2018-10-04 MED ORDER — TORSEMIDE 100 MG PO TABS: 50.0000 mg | ORAL_TABLET | Freq: Every day | ORAL | Status: DC

## 2018-10-04 MED ORDER — MAGNESIUM CHLORIDE 64 MG PO TBEC: 1.0000 | DELAYED_RELEASE_TABLET | Freq: Two times a day (BID) | ORAL | 3 refills | Status: DC

## 2018-10-04 NOTE — Patient Instructions (Signed)
Medication Instructions:  Your physician has recommended you make the following change in your medication:   DECREASE torsemide (demadex) 100 mg: Take 0.5 tablet daily  STOP metolazone (zaroxolyn)    START magnesium chloride (slo-mag) 64 mg: Take 1 tablet twice daily   INCREASE metoprolol tartrate (lopressor) 50 mg: Take 1 tablet every 6 hours  If you need a refill on your cardiac medications before your next appointment, please call your pharmacy.   Lab work: Your physician recommends that you return for lab work todayL BMP, Magnesium level.   If you have labs (blood work) drawn today and your tests are completely normal, you will receive your results only by: [name] MyChart Message (if you have MyChart) OR . A paper copy in the mail If you have any lab test that is abnormal or we need to change your treatment, we will call you to review the results.  Testing/Procedures: None  Follow-Up: At [facility], you and your health needs are our priority.  As part of our continuing mission to provide you with exceptional heart care, we have created designated Provider Care Teams.  These Care Teams include your primary Cardiologist (physician) and Advanced Practice Providers (APPs -  Physician Assistants and Nurse Practitioners) who all work together to provide you with the care you need, when you need it. You will need a follow up appointment in 6 weeks.

## 2018-10-05 LAB — BASIC METABOLIC PANEL
BUN/Creatinine Ratio: 15 (ref 10–24)
BUN: 27 mg/dL (ref 8–27)
CALCIUM: 10.9 mg/dL — AB (ref 8.6–10.2)
CHLORIDE: 99 mmol/L (ref 96–106)
CO2: 26 mmol/L (ref 20–29)
Creatinine, Ser: 1.78 mg/dL — ABNORMAL HIGH (ref 0.76–1.27)
GFR calc non Af Amer: 37 mL/min/{1.73_m2} — ABNORMAL LOW (ref >59)
GFR, EST AFRICAN AMERICAN: 42 mL/min/{1.73_m2} — AB (ref >59)
GLUCOSE: 93 mg/dL (ref 65–99)
POTASSIUM: 4.5 mmol/L (ref 3.5–5.2)
Sodium: 143 mmol/L (ref 134–144)

## 2018-10-05 LAB — MAGNESIUM: Magnesium: 2.3 mg/dL (ref 1.6–2.3)

## 2018-10-06 DIAGNOSIS — E1165 Type 2 diabetes mellitus with hyperglycemia: Secondary | ICD-10-CM | POA: Diagnosis not present

## 2018-10-06 DIAGNOSIS — I10 Essential (primary) hypertension: Secondary | ICD-10-CM | POA: Diagnosis not present

## 2018-10-06 DIAGNOSIS — M1991 Primary osteoarthritis, unspecified site: Secondary | ICD-10-CM | POA: Diagnosis not present

## 2018-10-06 DIAGNOSIS — Z7901 Long term (current) use of anticoagulants: Secondary | ICD-10-CM | POA: Diagnosis not present

## 2018-10-06 DIAGNOSIS — J449 Chronic obstructive pulmonary disease, unspecified: Secondary | ICD-10-CM | POA: Diagnosis not present

## 2018-10-06 DIAGNOSIS — E876 Hypokalemia: Secondary | ICD-10-CM | POA: Diagnosis not present

## 2018-10-06 DIAGNOSIS — N179 Acute kidney failure, unspecified: Secondary | ICD-10-CM | POA: Diagnosis not present

## 2018-10-06 DIAGNOSIS — I4891 Unspecified atrial fibrillation: Secondary | ICD-10-CM | POA: Diagnosis not present

## 2018-10-06 DIAGNOSIS — E669 Obesity, unspecified: Secondary | ICD-10-CM | POA: Diagnosis not present

## 2018-10-06 DIAGNOSIS — Z7984 Long term (current) use of oral hypoglycemic drugs: Secondary | ICD-10-CM | POA: Diagnosis not present

## 2018-10-10 DIAGNOSIS — G2581 Restless legs syndrome: Secondary | ICD-10-CM | POA: Diagnosis not present

## 2018-10-10 DIAGNOSIS — G63 Polyneuropathy in diseases classified elsewhere: Secondary | ICD-10-CM | POA: Diagnosis not present

## 2018-10-10 DIAGNOSIS — G894 Chronic pain syndrome: Secondary | ICD-10-CM | POA: Diagnosis not present

## 2018-10-10 DIAGNOSIS — M961 Postlaminectomy syndrome, not elsewhere classified: Secondary | ICD-10-CM | POA: Diagnosis not present

## 2018-10-11 DIAGNOSIS — G63 Polyneuropathy in diseases classified elsewhere: Secondary | ICD-10-CM | POA: Insufficient documentation

## 2018-10-13 DIAGNOSIS — I10 Essential (primary) hypertension: Secondary | ICD-10-CM | POA: Diagnosis not present

## 2018-10-13 DIAGNOSIS — E876 Hypokalemia: Secondary | ICD-10-CM | POA: Diagnosis not present

## 2018-10-13 DIAGNOSIS — Z7984 Long term (current) use of oral hypoglycemic drugs: Secondary | ICD-10-CM | POA: Diagnosis not present

## 2018-10-13 DIAGNOSIS — I4891 Unspecified atrial fibrillation: Secondary | ICD-10-CM | POA: Diagnosis not present

## 2018-10-13 DIAGNOSIS — N179 Acute kidney failure, unspecified: Secondary | ICD-10-CM | POA: Diagnosis not present

## 2018-10-13 DIAGNOSIS — E669 Obesity, unspecified: Secondary | ICD-10-CM | POA: Diagnosis not present

## 2018-10-13 DIAGNOSIS — E1165 Type 2 diabetes mellitus with hyperglycemia: Secondary | ICD-10-CM | POA: Diagnosis not present

## 2018-10-13 DIAGNOSIS — J449 Chronic obstructive pulmonary disease, unspecified: Secondary | ICD-10-CM | POA: Diagnosis not present

## 2018-10-13 DIAGNOSIS — M1991 Primary osteoarthritis, unspecified site: Secondary | ICD-10-CM | POA: Diagnosis not present

## 2018-10-13 DIAGNOSIS — Z7901 Long term (current) use of anticoagulants: Secondary | ICD-10-CM | POA: Diagnosis not present

## 2018-10-17 DIAGNOSIS — E1121 Type 2 diabetes mellitus with diabetic nephropathy: Secondary | ICD-10-CM | POA: Diagnosis not present

## 2018-10-17 DIAGNOSIS — E1122 Type 2 diabetes mellitus with diabetic chronic kidney disease: Secondary | ICD-10-CM | POA: Diagnosis not present

## 2018-10-17 DIAGNOSIS — E1142 Type 2 diabetes mellitus with diabetic polyneuropathy: Secondary | ICD-10-CM | POA: Diagnosis not present

## 2018-10-24 DIAGNOSIS — E669 Obesity, unspecified: Secondary | ICD-10-CM | POA: Diagnosis not present

## 2018-10-24 DIAGNOSIS — I129 Hypertensive chronic kidney disease with stage 1 through stage 4 chronic kidney disease, or unspecified chronic kidney disease: Secondary | ICD-10-CM | POA: Diagnosis not present

## 2018-10-24 DIAGNOSIS — E876 Hypokalemia: Secondary | ICD-10-CM | POA: Diagnosis not present

## 2018-10-24 DIAGNOSIS — N183 Chronic kidney disease, stage 3 (moderate): Secondary | ICD-10-CM | POA: Diagnosis not present

## 2018-10-24 DIAGNOSIS — R600 Localized edema: Secondary | ICD-10-CM | POA: Diagnosis not present

## 2018-10-25 DIAGNOSIS — E669 Obesity, unspecified: Secondary | ICD-10-CM | POA: Diagnosis not present

## 2018-10-25 DIAGNOSIS — N179 Acute kidney failure, unspecified: Secondary | ICD-10-CM | POA: Diagnosis not present

## 2018-10-25 DIAGNOSIS — I4891 Unspecified atrial fibrillation: Secondary | ICD-10-CM | POA: Diagnosis not present

## 2018-10-25 DIAGNOSIS — E1165 Type 2 diabetes mellitus with hyperglycemia: Secondary | ICD-10-CM | POA: Diagnosis not present

## 2018-10-25 DIAGNOSIS — I10 Essential (primary) hypertension: Secondary | ICD-10-CM | POA: Diagnosis not present

## 2018-10-25 DIAGNOSIS — J449 Chronic obstructive pulmonary disease, unspecified: Secondary | ICD-10-CM | POA: Diagnosis not present

## 2018-10-25 DIAGNOSIS — Z7901 Long term (current) use of anticoagulants: Secondary | ICD-10-CM | POA: Diagnosis not present

## 2018-10-25 DIAGNOSIS — M1991 Primary osteoarthritis, unspecified site: Secondary | ICD-10-CM | POA: Diagnosis not present

## 2018-10-25 DIAGNOSIS — E876 Hypokalemia: Secondary | ICD-10-CM | POA: Diagnosis not present

## 2018-10-25 DIAGNOSIS — Z7984 Long term (current) use of oral hypoglycemic drugs: Secondary | ICD-10-CM | POA: Diagnosis not present

## 2018-11-11 DIAGNOSIS — Z7901 Long term (current) use of anticoagulants: Secondary | ICD-10-CM | POA: Diagnosis not present

## 2018-11-11 DIAGNOSIS — M1991 Primary osteoarthritis, unspecified site: Secondary | ICD-10-CM | POA: Diagnosis not present

## 2018-11-11 DIAGNOSIS — E876 Hypokalemia: Secondary | ICD-10-CM | POA: Diagnosis not present

## 2018-11-11 DIAGNOSIS — I10 Essential (primary) hypertension: Secondary | ICD-10-CM | POA: Diagnosis not present

## 2018-11-11 DIAGNOSIS — N179 Acute kidney failure, unspecified: Secondary | ICD-10-CM | POA: Diagnosis not present

## 2018-11-11 DIAGNOSIS — E669 Obesity, unspecified: Secondary | ICD-10-CM | POA: Diagnosis not present

## 2018-11-11 DIAGNOSIS — I4891 Unspecified atrial fibrillation: Secondary | ICD-10-CM | POA: Diagnosis not present

## 2018-11-11 DIAGNOSIS — J449 Chronic obstructive pulmonary disease, unspecified: Secondary | ICD-10-CM | POA: Diagnosis not present

## 2018-11-11 DIAGNOSIS — E1165 Type 2 diabetes mellitus with hyperglycemia: Secondary | ICD-10-CM | POA: Diagnosis not present

## 2018-11-11 DIAGNOSIS — Z7984 Long term (current) use of oral hypoglycemic drugs: Secondary | ICD-10-CM | POA: Diagnosis not present

## 2018-11-14 NOTE — Progress Notes (Signed)
Cardiology Office Note:    Date:  11/16/2018   ID:  Shawn Sullivan, DOB 1946/02/29, MRN [id number]  PCP:  Rochel Brome, MD  Cardiologist:  Shirlee More, MD    Referring MD: Rochel Brome, MD    ASSESSMENT:    1. Persistent atrial fibrillation   2. Hypertensive heart disease with chronic diastolic congestive heart failure ([place])   3. Chronic obstructive pulmonary disease, unspecified COPD type ([place])   4. Obstructive sleep apnea   5. Anticoagulated    PLAN:    In order of problems listed above:  1. Improved with better rate control continue his beta-blocker initiate amiodarone if unsuccessful discontinue continue his anticoagulant taking it the day of the procedure. 2. Stable heart failure is compensated continue his current diuretic blood pressure target continue current antihypertensives 3. Stable managed by his PCP 4. Stable continue CPAP 5. Stable compliant continue his direct anticoagulant including taking a dose the day of cardioversion possible water   Next appointment: 4 weeks   Medication Adjustments/Labs and Tests Ordered: Current medicines are reviewed at length with the patient today.  Concerns regarding medicines are outlined above.  No orders of the defined types were placed in this encounter.  No orders of the defined types were placed in this encounter.   No chief complaint on file.   History of Present Illness:    Shawn Sullivan is a 72 y.o. male with a hx of  persistent atrial fibrillation hypertension chronic kidney disease 3 COPD and obstructive sleep apnea last seen by me 10/04/2018 with difficult to control atrial fibrillation and hypo-kalemia.  Once hypokalemia is corrected we will again consider the issue of cardioversion resumption of sinus rhythm. Compliance with diet, lifestyle and medications: His hypokalemia is corrected he feels better but is just diffusely weak exercise intolerance exertional shortness of breath and sees me today  with the intent that he would like an attempt to restore sinus rhythm with his atrial fibrillation.  He understands the likelihood is 50% or less with persistent atrial fibrillation and concerned he quickly resumed atrial fibrillation and actually can start him on amiodarone and we can discontinue if the procedure is not effective.  He has obstructive sleep apnea uses CPAP minimal edema no orthopnea chest pain or syncope.  He is compliant with his anticoagulant and will take both the anticoagulant beta-blocker sip water the day of the procedure.  Risk benefits and options detailed patient and daughter are in agreement Past Medical History:  Diagnosis Date  . Anticoagulated 07/01/2018  . Anticoagulated 07/01/2018  . Benign essential hypertension 05/06/2013  . Chronic obstructive pulmonary disease ([place]) 11/05/2015  . CKD (chronic kidney disease) stage 2, GFR 60-89 ml/min 07/01/2018  . COPD (chronic obstructive pulmonary disease) ([place]) 07/01/2018  . Dermatochalasis 12/16/2015  . Disorder of bursae and tendons in shoulder region 01/05/2014  . Generalized edema 06/22/2016  . Hyperlipidemia 12/16/2015  . Hypertensive heart disease 07/01/2018  . Low back pain 08/17/2013  . Lumbosacral spondylosis 05/06/2013  . Obstructive sleep apnea 07/01/2018  . Persistent atrial fibrillation 07/01/2018  . Piriformis syndrome 05/03/2014  . Postlaminectomy syndrome, lumbar region 05/06/2013  . Presbyopia of both eyes 12/16/2015  . Restless legs syndrome 03/23/2016  . Restrictive lung disease 06/22/2016  . Sleep apnea 05/06/2013  . Type 2 diabetes mellitus without complications ([place]) 40/07/8118    Past Surgical History:  Procedure Laterality Date  . APPENDECTOMY    . BACK SURGERY    . NECK SURGERY  Current Medications: Current Meds  Medication Sig  . albuterol (PROVENTIL HFA;VENTOLIN HFA) 108 (90 Base) MCG/ACT inhaler Inhale 1-2 puffs into the lungs every 6 (six) hours as needed.  [name] apixaban (ELIQUIS) 5 MG TABS tablet Take 5 mg by  mouth 2 (two) times daily.   [name] CINNAMON PO Take 350 mg by mouth 2 (two) times daily.  . Coenzyme Q10 (COQ10) 100 MG CAPS Take 100 mg by mouth daily.  . Ferrous Sulfate (IRON) 325 (65 Fe) MG TABS Take 1 tablet by mouth daily.  . Fluticasone-Umeclidin-Vilant (TRELEGY ELLIPTA) 100-62.5-25 MCG/INH AEPB Inhale 1 puff into the lungs daily.  [name] glipiZIDE (GLUCOTROL) 10 MG tablet Take 10 mg by mouth 2 (two) times daily.  [name] HYDROcodone-acetaminophen (NORCO) 10-325 MG tablet Take 1 tablet by mouth every 4 (four) hours as needed.  . Insulin Glargine-Lixisenatide (SOLIQUA) 100-33 UNT-MCG/ML SOPN Inject 30 Units into the skin daily.  [name] ipratropium-albuterol (DUONEB) 0.5-2.5 (3) MG/3ML SOLN Take 3 mLs by nebulization every 6 (six) hours as needed.  . magnesium chloride (SLOW-MAG) 64 MG TBEC SR tablet Take 1 tablet (64 mg total) by mouth 2 (two) times daily.  . metoprolol tartrate (LOPRESSOR) 50 MG tablet Take 1 tablet (50 mg total) by mouth every 6 (six) hours.  . montelukast (SINGULAIR) 10 MG tablet Take 10 mg by mouth at bedtime.  [name] omeprazole (PRILOSEC) 20 MG capsule Take 20 mg by mouth 2 (two) times daily before a meal.   . potassium chloride SA (K-DUR,KLOR-CON) 20 MEQ tablet Take 40 mEq by mouth 2 (two) times daily.   . sertraline (ZOLOFT) 50 MG tablet Take 50 mg by mouth 2 (two) times daily.  . tamsulosin (FLOMAX) 0.4 MG CAPS capsule Take 0.4 mg by mouth daily.  . Thiamine HCl (VITAMIN B1 PO) Take 1 tablet by mouth daily.  [name] torsemide (DEMADEX) 100 MG tablet Take 0.5 tablets (50 mg total) by mouth daily. (Patient taking differently: Take 50 mg by mouth 2 (two) times daily. )  . vitamin B-12 (CYANOCOBALAMIN) 1000 MCG tablet Take 1,000 mcg by mouth daily.     Allergies:   Patient has no known allergies.   Social History   Socioeconomic History  . Marital status: Married    Spouse name: Not on file  . Number of children: Not on file  . Years of education: Not on file  . Highest education level: Not  on file  Occupational History  . Not on file  Social Needs  . Financial resource strain: Not on file  . Food insecurity:    Worry: Not on file    Inability: Not on file  . Transportation needs:    Medical: Not on file    Non-medical: Not on file  Tobacco Use  . Smoking status: Former Research scientist (life sciences)  . Smokeless tobacco: Never Used  Substance and Sexual Activity  . Alcohol use: Not Currently    Frequency: Never  . Drug use: Not Currently  . Sexual activity: Not on file  Lifestyle  . Physical activity:    Days per week: Not on file    Minutes per session: Not on file  . Stress: Not on file  Relationships  . Social connections:    Talks on phone: Not on file    Gets together: Not on file    Attends religious service: Not on file    Active member of club or organization: Not on file    Attends meetings of clubs or organizations: Not on file  Relationship status: Not on file  Other Topics Concern  . Not on file  Social History Narrative  . Not on file     Family History: The patient's family history includes Atrial fibrillation in his mother; Cancer in his mother; Heart disease in his father. ROS:   Please see the history of present illness.    All other systems reviewed and are negative.  EKGs/Labs/Other Studies Reviewed:    The following studies were reviewed today:  EKG:  EKG ordered today.  The ekg ordered today demonstrates AF controlled rate RBBB Echocardiogram 08/11/2018 showed mild LVH EF 55 to 60% mild aortic stenosis mild mitral regurgitation moderate left atrial enlargement mild to moderate right atrial enlargement Recent Labs: 10/04/2018: BUN 27; Creatinine, Ser 1.78; Magnesium 2.3; Potassium 4.5; Sodium 143  Recent Lipid Panel No results found for: CHOL, TRIG, HDL, CHOLHDL, VLDL, LDLCALC, LDLDIRECT  Physical Exam:    VS:  BP (!) 142/70 (BP Location: Left Arm, Patient Position: Sitting, Cuff Size: Large)   Pulse 88   Ht 5\' 9"  (1.753 m)   Wt (!) 319 lb 6 oz  (144.9 kg)   SpO2 94%   BMI 47.16 kg/m     Wt Readings from Last 3 Encounters:  11/16/18 (!) 319 lb 6 oz (144.9 kg)  10/04/18 (!) 312 lb 6.4 oz (141.7 kg)  08/22/18 (!) 347 lb 3.2 oz (157.5 kg)     GEN: Quite obese BMI 47 well nourished, well developed in no acute distress HEENT: Normal NECK: No JVD; No carotid bruits LYMPHATICS: No lymphadenopathy CARDIAC: Irregular rhythm variable first heart sound , no murmurs, rubs, gallops RESPIRATORY:  Clear to auscultation without rales, wheezing or rhonchi  ABDOMEN: Soft, non-tender, non-distended MUSCULOSKELETAL: Trace to 1+ bilateral ankle edema; No deformity  SKIN: Warm and dry NEUROLOGIC:  Alert and oriented x 3 PSYCHIATRIC:  Normal affect    Signed, Shirlee More, MD  11/16/2018 3:44 PM    [facility]

## 2018-11-14 NOTE — H&P (View-Only) (Signed)
Cardiology Office Note:    Date:  11/16/2018   ID:  Shawn Sullivan, DOB 1946/04/28, MRN [id number]  PCP:  Rochel Brome, MD  Cardiologist:  Shirlee More, MD    Referring MD: Rochel Brome, MD    ASSESSMENT:    1. Persistent atrial fibrillation   2. Hypertensive heart disease with chronic diastolic congestive heart failure ([place])   3. Chronic obstructive pulmonary disease, unspecified COPD type ([place])   4. Obstructive sleep apnea   5. Anticoagulated    PLAN:    In order of problems listed above:  1. Improved with better rate control continue his beta-blocker initiate amiodarone if unsuccessful discontinue continue his anticoagulant taking it the day of the procedure. 2. Stable heart failure is compensated continue his current diuretic blood pressure target continue current antihypertensives 3. Stable managed by his PCP 4. Stable continue CPAP 5. Stable compliant continue his direct anticoagulant including taking a dose the day of cardioversion possible water   Next appointment: 4 weeks   Medication Adjustments/Labs and Tests Ordered: Current medicines are reviewed at length with the patient today.  Concerns regarding medicines are outlined above.  No orders of the defined types were placed in this encounter.  No orders of the defined types were placed in this encounter.   No chief complaint on file.   History of Present Illness:    Shawn Sullivan is a 72 y.o. male with a hx of  persistent atrial fibrillation hypertension chronic kidney disease 3 COPD and obstructive sleep apnea last seen by me 10/04/2018 with difficult to control atrial fibrillation and hypo-kalemia.  Once hypokalemia is corrected we will again consider the issue of cardioversion resumption of sinus rhythm. Compliance with diet, lifestyle and medications: His hypokalemia is corrected he feels better but is just diffusely weak exercise intolerance exertional shortness of breath and sees me today  with the intent that he would like an attempt to restore sinus rhythm with his atrial fibrillation.  He understands the likelihood is 50% or less with persistent atrial fibrillation and concerned he quickly resumed atrial fibrillation and actually can start him on amiodarone and we can discontinue if the procedure is not effective.  He has obstructive sleep apnea uses CPAP minimal edema no orthopnea chest pain or syncope.  He is compliant with his anticoagulant and will take both the anticoagulant beta-blocker sip water the day of the procedure.  Risk benefits and options detailed patient and daughter are in agreement Past Medical History:  Diagnosis Date  . Anticoagulated 07/01/2018  . Anticoagulated 07/01/2018  . Benign essential hypertension 05/06/2013  . Chronic obstructive pulmonary disease ([place]) 11/05/2015  . CKD (chronic kidney disease) stage 2, GFR 60-89 ml/min 07/01/2018  . COPD (chronic obstructive pulmonary disease) ([place]) 07/01/2018  . Dermatochalasis 12/16/2015  . Disorder of bursae and tendons in shoulder region 01/05/2014  . Generalized edema 06/22/2016  . Hyperlipidemia 12/16/2015  . Hypertensive heart disease 07/01/2018  . Low back pain 08/17/2013  . Lumbosacral spondylosis 05/06/2013  . Obstructive sleep apnea 07/01/2018  . Persistent atrial fibrillation 07/01/2018  . Piriformis syndrome 05/03/2014  . Postlaminectomy syndrome, lumbar region 05/06/2013  . Presbyopia of both eyes 12/16/2015  . Restless legs syndrome 03/23/2016  . Restrictive lung disease 06/22/2016  . Sleep apnea 05/06/2013  . Type 2 diabetes mellitus without complications ([place]) 23/03/3613    Past Surgical History:  Procedure Laterality Date  . APPENDECTOMY    . BACK SURGERY    . NECK SURGERY  Current Medications: Current Meds  Medication Sig  . albuterol (PROVENTIL HFA;VENTOLIN HFA) 108 (90 Base) MCG/ACT inhaler Inhale 1-2 puffs into the lungs every 6 (six) hours as needed.  [name] apixaban (ELIQUIS) 5 MG TABS tablet Take 5 mg by  mouth 2 (two) times daily.   [name] CINNAMON PO Take 350 mg by mouth 2 (two) times daily.  . Coenzyme Q10 (COQ10) 100 MG CAPS Take 100 mg by mouth daily.  . Ferrous Sulfate (IRON) 325 (65 Fe) MG TABS Take 1 tablet by mouth daily.  . Fluticasone-Umeclidin-Vilant (TRELEGY ELLIPTA) 100-62.5-25 MCG/INH AEPB Inhale 1 puff into the lungs daily.  [name] glipiZIDE (GLUCOTROL) 10 MG tablet Take 10 mg by mouth 2 (two) times daily.  [name] HYDROcodone-acetaminophen (NORCO) 10-325 MG tablet Take 1 tablet by mouth every 4 (four) hours as needed.  . Insulin Glargine-Lixisenatide (SOLIQUA) 100-33 UNT-MCG/ML SOPN Inject 30 Units into the skin daily.  [name] ipratropium-albuterol (DUONEB) 0.5-2.5 (3) MG/3ML SOLN Take 3 mLs by nebulization every 6 (six) hours as needed.  . magnesium chloride (SLOW-MAG) 64 MG TBEC SR tablet Take 1 tablet (64 mg total) by mouth 2 (two) times daily.  . metoprolol tartrate (LOPRESSOR) 50 MG tablet Take 1 tablet (50 mg total) by mouth every 6 (six) hours.  . montelukast (SINGULAIR) 10 MG tablet Take 10 mg by mouth at bedtime.  [name] omeprazole (PRILOSEC) 20 MG capsule Take 20 mg by mouth 2 (two) times daily before a meal.   . potassium chloride SA (K-DUR,KLOR-CON) 20 MEQ tablet Take 40 mEq by mouth 2 (two) times daily.   . sertraline (ZOLOFT) 50 MG tablet Take 50 mg by mouth 2 (two) times daily.  . tamsulosin (FLOMAX) 0.4 MG CAPS capsule Take 0.4 mg by mouth daily.  . Thiamine HCl (VITAMIN B1 PO) Take 1 tablet by mouth daily.  [name] torsemide (DEMADEX) 100 MG tablet Take 0.5 tablets (50 mg total) by mouth daily. (Patient taking differently: Take 50 mg by mouth 2 (two) times daily. )  . vitamin B-12 (CYANOCOBALAMIN) 1000 MCG tablet Take 1,000 mcg by mouth daily.     Allergies:   Patient has no known allergies.   Social History   Socioeconomic History  . Marital status: Married    Spouse name: Not on file  . Number of children: Not on file  . Years of education: Not on file  . Highest education level: Not  on file  Occupational History  . Not on file  Social Needs  . Financial resource strain: Not on file  . Food insecurity:    Worry: Not on file    Inability: Not on file  . Transportation needs:    Medical: Not on file    Non-medical: Not on file  Tobacco Use  . Smoking status: Former Research scientist (life sciences)  . Smokeless tobacco: Never Used  Substance and Sexual Activity  . Alcohol use: Not Currently    Frequency: Never  . Drug use: Not Currently  . Sexual activity: Not on file  Lifestyle  . Physical activity:    Days per week: Not on file    Minutes per session: Not on file  . Stress: Not on file  Relationships  . Social connections:    Talks on phone: Not on file    Gets together: Not on file    Attends religious service: Not on file    Active member of club or organization: Not on file    Attends meetings of clubs or organizations: Not on file  Relationship status: Not on file  Other Topics Concern  . Not on file  Social History Narrative  . Not on file     Family History: The patient's family history includes Atrial fibrillation in his mother; Cancer in his mother; Heart disease in his father. ROS:   Please see the history of present illness.    All other systems reviewed and are negative.  EKGs/Labs/Other Studies Reviewed:    The following studies were reviewed today:  EKG:  EKG ordered today.  The ekg ordered today demonstrates AF controlled rate RBBB Echocardiogram 08/11/2018 showed mild LVH EF 55 to 60% mild aortic stenosis mild mitral regurgitation moderate left atrial enlargement mild to moderate right atrial enlargement Recent Labs: 10/04/2018: BUN 27; Creatinine, Ser 1.78; Magnesium 2.3; Potassium 4.5; Sodium 143  Recent Lipid Panel No results found for: CHOL, TRIG, HDL, CHOLHDL, VLDL, LDLCALC, LDLDIRECT  Physical Exam:    VS:  BP (!) 142/70 (BP Location: Left Arm, Patient Position: Sitting, Cuff Size: Large)   Pulse 88   Ht 5\' 9"  (1.753 m)   Wt (!) 319 lb 6 oz  (144.9 kg)   SpO2 94%   BMI 47.16 kg/m     Wt Readings from Last 3 Encounters:  11/16/18 (!) 319 lb 6 oz (144.9 kg)  10/04/18 (!) 312 lb 6.4 oz (141.7 kg)  08/22/18 (!) 347 lb 3.2 oz (157.5 kg)     GEN: Quite obese BMI 47 well nourished, well developed in no acute distress HEENT: Normal NECK: No JVD; No carotid bruits LYMPHATICS: No lymphadenopathy CARDIAC: Irregular rhythm variable first heart sound , no murmurs, rubs, gallops RESPIRATORY:  Clear to auscultation without rales, wheezing or rhonchi  ABDOMEN: Soft, non-tender, non-distended MUSCULOSKELETAL: Trace to 1+ bilateral ankle edema; No deformity  SKIN: Warm and dry NEUROLOGIC:  Alert and oriented x 3 PSYCHIATRIC:  Normal affect    Signed, Shirlee More, MD  11/16/2018 3:44 PM    [facility]

## 2018-11-16 ENCOUNTER — Encounter: Payer: Self-pay | Admitting: Cardiology

## 2018-11-16 ENCOUNTER — Ambulatory Visit (INDEPENDENT_AMBULATORY_CARE_PROVIDER_SITE_OTHER): Payer: Medicare HMO | Admitting: Cardiology

## 2018-11-16 VITALS — BP 142/70 | HR 88 | Ht 69.0 in | Wt 319.4 lb

## 2018-11-16 DIAGNOSIS — I5032 Chronic diastolic (congestive) heart failure: Secondary | ICD-10-CM

## 2018-11-16 DIAGNOSIS — Z7901 Long term (current) use of anticoagulants: Secondary | ICD-10-CM | POA: Diagnosis not present

## 2018-11-16 DIAGNOSIS — I4819 Other persistent atrial fibrillation: Secondary | ICD-10-CM

## 2018-11-16 DIAGNOSIS — J449 Chronic obstructive pulmonary disease, unspecified: Secondary | ICD-10-CM | POA: Diagnosis not present

## 2018-11-16 DIAGNOSIS — G4733 Obstructive sleep apnea (adult) (pediatric): Secondary | ICD-10-CM

## 2018-11-16 DIAGNOSIS — I110 Hypertensive heart disease with heart failure: Secondary | ICD-10-CM

## 2018-11-16 MED ORDER — AMIODARONE HCL 200 MG PO TABS: 200.0000 mg | ORAL_TABLET | Freq: Two times a day (BID) | ORAL | 1 refills | Status: DC

## 2018-11-16 NOTE — Patient Instructions (Addendum)
Medication Instructions:  Your physician has recommended you make the following change in your medication:  START: amiodarone 200mg  one tablet twice daily  If you need a refill on your cardiac medications before your next appointment, please call your pharmacy.   Lab work: You will have lab work 1 week prior to procedure to have lab work. CBC BMP ProBNP INR If you have labs (blood work) drawn today and your tests are completely normal, you will receive your results only by: [name] MyChart Message (if you have MyChart) OR . A paper copy in the mail If you have any lab test that is abnormal or we need to change your treatment, we will call you to review the results.  Testing/Procedures: You had an EKG today   You are scheduled for a Cardioversion/ on 12-01-2018 with Dr.Croitora.  Please arrive at the [facility] (Main Entrance A) at [facility]: [address] [place], [place] [address] at 12:00 pm. (1 hour prior to procedure unless lab work is needed; if lab work is needed arrive 1.5 hours ahead)  DIET: Nothing to eat or drink after midnight except a sip of water with medications (see medication instructions below)  Medication Instructions: Hold torsemide on the morning of the test.   Hold glipizide on the morning of the test.   Take 1/2 morning dose of Insulin the morning of the test.  Continue your anticoagulant: Eliquis You will need to continue your anticoagulant after your procedure until you are told by your provider that it is safe to stop.   Labs: If patient is on Coumadin, patient needs pt/INR, CBC, BMET within 3 days (No pt/INR needed for patients taking Xarelto, Eliquis, Pradaxa) For patients receiving anesthesia for TEE and all Cardioversion patients: BMET, CBC within 1 week   You must have a responsible person to drive you home and stay in the waiting area during your procedure. Failure to do so could result in cancellation.  Bring your insurance cards.  *Special  Note: Every effort is made to have your procedure done on time. Occasionally there are emergencies that occur at the hospital that may cause delays. Please be patient if a delay does occur.    Follow-Up: At [facility], you and your health needs are our priority.  As part of our continuing mission to provide you with exceptional heart care, we have created designated Provider Care Teams.  These Care Teams include your primary Cardiologist (physician) and Advanced Practice Providers (APPs -  Physician Assistants and Nurse Practitioners) who all work together to provide you with the care you need, when you need it. You will need a follow up appointment in 1 months.  Please call our office 2 months in advance to schedule this appointment.

## 2018-11-25 DIAGNOSIS — I4819 Other persistent atrial fibrillation: Secondary | ICD-10-CM | POA: Diagnosis not present

## 2018-11-25 DIAGNOSIS — I5032 Chronic diastolic (congestive) heart failure: Secondary | ICD-10-CM | POA: Diagnosis not present

## 2018-11-25 DIAGNOSIS — J449 Chronic obstructive pulmonary disease, unspecified: Secondary | ICD-10-CM | POA: Diagnosis not present

## 2018-11-25 DIAGNOSIS — I110 Hypertensive heart disease with heart failure: Secondary | ICD-10-CM | POA: Diagnosis not present

## 2018-11-25 DIAGNOSIS — G4733 Obstructive sleep apnea (adult) (pediatric): Secondary | ICD-10-CM | POA: Diagnosis not present

## 2018-11-25 DIAGNOSIS — Z7901 Long term (current) use of anticoagulants: Secondary | ICD-10-CM | POA: Diagnosis not present

## 2018-11-26 LAB — BASIC METABOLIC PANEL
BUN/Creatinine Ratio: 10 (ref 10–24)
BUN: 16 mg/dL (ref 8–27)
CHLORIDE: 98 mmol/L (ref 96–106)
CO2: 26 mmol/L (ref 20–29)
Calcium: 9.8 mg/dL (ref 8.6–10.2)
Creatinine, Ser: 1.57 mg/dL — ABNORMAL HIGH (ref 0.76–1.27)
GFR calc non Af Amer: 43 mL/min/{1.73_m2} — ABNORMAL LOW (ref >59)
GFR, EST AFRICAN AMERICAN: 49 mL/min/{1.73_m2} — AB (ref >59)
Glucose: 108 mg/dL — ABNORMAL HIGH (ref 65–99)
Potassium: 4 mmol/L (ref 3.5–5.2)
Sodium: 143 mmol/L (ref 134–144)

## 2018-11-26 LAB — CBC
Hematocrit: 44.5 % (ref 37.5–51.0)
Hemoglobin: 14.5 g/dL (ref 13.0–17.7)
MCH: 27.2 pg (ref 26.6–33.0)
MCHC: 32.6 g/dL (ref 31.5–35.7)
MCV: 83 fL (ref 79–97)
PLATELETS: 216 10*3/uL (ref 150–450)
RBC: 5.34 x10E6/uL (ref 4.14–5.80)
RDW: 14.7 % (ref 12.3–15.4)
WBC: 9.5 10*3/uL (ref 3.4–10.8)

## 2018-11-26 LAB — PROTIME-INR
INR: 1.1 (ref 0.8–1.2)
Prothrombin Time: 11.1 s (ref 9.1–12.0)

## 2018-11-26 LAB — PRO B NATRIURETIC PEPTIDE: NT-Pro BNP: 678 pg/mL — ABNORMAL HIGH (ref 0–376)

## 2018-12-01 ENCOUNTER — Encounter (HOSPITAL_COMMUNITY): Payer: Self-pay | Admitting: *Deleted

## 2018-12-01 ENCOUNTER — Ambulatory Visit (HOSPITAL_COMMUNITY): Payer: Medicare HMO | Admitting: Anesthesiology

## 2018-12-01 ENCOUNTER — Ambulatory Visit (HOSPITAL_COMMUNITY)
Admission: RE | Admit: 2018-12-01 | Discharge: 2018-12-01 | Disposition: A | Discharge status: Home / Self Care | Payer: Medicare HMO | Attending: Cardiovascular Disease | Admitting: Cardiovascular Disease

## 2018-12-01 ENCOUNTER — Other Ambulatory Visit: Payer: Self-pay

## 2018-12-01 ENCOUNTER — Encounter (HOSPITAL_COMMUNITY)
Admission: RE | Disposition: A | Discharge status: Home / Self Care | Payer: Self-pay | Source: Home / Self Care | Attending: Cardiovascular Disease

## 2018-12-01 DIAGNOSIS — Z6841 Body Mass Index (BMI) 40.0 and over, adult: Secondary | ICD-10-CM | POA: Diagnosis not present

## 2018-12-01 DIAGNOSIS — E1122 Type 2 diabetes mellitus with diabetic chronic kidney disease: Secondary | ICD-10-CM | POA: Insufficient documentation

## 2018-12-01 DIAGNOSIS — Z794 Long term (current) use of insulin: Secondary | ICD-10-CM | POA: Insufficient documentation

## 2018-12-01 DIAGNOSIS — I5032 Chronic diastolic (congestive) heart failure: Secondary | ICD-10-CM | POA: Insufficient documentation

## 2018-12-01 DIAGNOSIS — N183 Chronic kidney disease, stage 3 (moderate): Secondary | ICD-10-CM | POA: Insufficient documentation

## 2018-12-01 DIAGNOSIS — I130 Hypertensive heart and chronic kidney disease with heart failure and stage 1 through stage 4 chronic kidney disease, or unspecified chronic kidney disease: Secondary | ICD-10-CM | POA: Diagnosis not present

## 2018-12-01 DIAGNOSIS — M961 Postlaminectomy syndrome, not elsewhere classified: Secondary | ICD-10-CM | POA: Insufficient documentation

## 2018-12-01 DIAGNOSIS — Z87891 Personal history of nicotine dependence: Secondary | ICD-10-CM | POA: Diagnosis not present

## 2018-12-01 DIAGNOSIS — G4733 Obstructive sleep apnea (adult) (pediatric): Secondary | ICD-10-CM | POA: Insufficient documentation

## 2018-12-01 DIAGNOSIS — G2581 Restless legs syndrome: Secondary | ICD-10-CM | POA: Insufficient documentation

## 2018-12-01 DIAGNOSIS — I4819 Other persistent atrial fibrillation: Secondary | ICD-10-CM

## 2018-12-01 DIAGNOSIS — J449 Chronic obstructive pulmonary disease, unspecified: Secondary | ICD-10-CM | POA: Diagnosis not present

## 2018-12-01 DIAGNOSIS — Z7901 Long term (current) use of anticoagulants: Secondary | ICD-10-CM | POA: Insufficient documentation

## 2018-12-01 DIAGNOSIS — I129 Hypertensive chronic kidney disease with stage 1 through stage 4 chronic kidney disease, or unspecified chronic kidney disease: Secondary | ICD-10-CM | POA: Diagnosis not present

## 2018-12-01 DIAGNOSIS — I480 Paroxysmal atrial fibrillation: Secondary | ICD-10-CM

## 2018-12-01 HISTORY — PX: CARDIOVERSION: SHX1299

## 2018-12-01 LAB — GLUCOSE, CAPILLARY: GLUCOSE-CAPILLARY: 120 mg/dL — AB (ref 70–99)

## 2018-12-01 SURGERY — CARDIOVERSION
Anesthesia: General

## 2018-12-01 MED ORDER — LIDOCAINE 2% (20 MG/ML) 5 ML SYRINGE
INTRAMUSCULAR | Status: DC | PRN
  Administered 2018-12-01: 40 mg via INTRAVENOUS

## 2018-12-01 MED ORDER — SODIUM CHLORIDE 0.9 % IV SOLN
INTRAVENOUS | Status: DC
  Administered 2018-12-01: 500 mL via INTRAVENOUS

## 2018-12-01 MED ORDER — PROPOFOL 10 MG/ML IV BOLUS
INTRAVENOUS | Status: DC | PRN
  Administered 2018-12-01: 100 mg via INTRAVENOUS

## 2018-12-01 MED ORDER — SODIUM CHLORIDE 0.9 % IV SOLN
INTRAVENOUS | Status: DC | PRN
  Administered 2018-12-01: 13:00:00 via INTRAVENOUS

## 2018-12-01 NOTE — Anesthesia Procedure Notes (Addendum)
Procedure Name: General with mask airway Date/Time: 12/01/2018 1:08 PM Performed by: Orlie Dakin, CRNA Pre-anesthesia Checklist: Patient identified, Emergency Drugs available, Suction available, Patient being monitored and Timeout performed Patient Re-evaluated:Patient Re-evaluated prior to induction Oxygen Delivery Method: Ambu bag Preoxygenation: Pre-oxygenation with 100% oxygen Induction Type: IV induction

## 2018-12-01 NOTE — Interval H&P Note (Signed)
History and Physical Interval Note:  12/01/2018 12:18 PM  Shawn Sullivan  has presented today for surgery, with the diagnosis of AFIB  The various methods of treatment have been discussed with the patient and family. After consideration of risks, benefits and other options for treatment, the patient has consented to  Procedure(s): CARDIOVERSION (N/A) as a surgical intervention .  The patient's history has been reviewed, patient examined, no change in status, stable for surgery.  I have reviewed the patient's chart and labs.  Questions were answered to the patient's satisfaction.     Dailey Alberson

## 2018-12-01 NOTE — Anesthesia Preprocedure Evaluation (Addendum)
Anesthesia Evaluation  Patient identified by MRN, date of birth, ID band Patient awake    Reviewed: Allergy & Precautions, NPO status , Patient's Chart, lab work & pertinent test results, reviewed documented beta blocker date and time   History of Anesthesia Complications Negative for: history of anesthetic complications  Airway Mallampati: III  TM Distance: >3 FB Neck ROM: Full    Dental  (+) Dental Advisory Given, Edentulous Lower, Upper Dentures   Pulmonary sleep apnea and Continuous Positive Airway Pressure Ventilation , COPD,  COPD inhaler, former smoker,    Pulmonary exam normal breath sounds clear to auscultation       Cardiovascular hypertension, Pt. on medications and Pt. on home beta blockers Normal cardiovascular exam+ dysrhythmias Atrial Fibrillation  Rhythm:Irregular Rate:Normal  TTE 9/19: EF 55-60%, mild AS, mild MR, moderate LAE/RAE    Neuro/Psych negative neurological ROS     GI/Hepatic negative GI ROS, Neg liver ROS,   Endo/Other  diabetes, Type 2, Insulin DependentMorbid obesity  Renal/GU Renal InsufficiencyRenal disease     Musculoskeletal  (+) Arthritis , Osteoarthritis,    Abdominal   Peds  Hematology negative hematology ROS (+)   Anesthesia Other Findings Day of surgery medications reviewed with the patient.  Reproductive/Obstetrics                            Anesthesia Physical Anesthesia Plan  ASA: III  Anesthesia Plan: General   Post-op Pain Management:    Induction: Intravenous  PONV Risk Score and Plan: 2 and Treatment may vary due to age or medical condition and Propofol infusion  Airway Management Planned: Mask  Additional Equipment:   Intra-op Plan:   Post-operative Plan:   Informed Consent: I have reviewed the patients History and Physical, chart, labs and discussed the procedure including the risks, benefits and alternatives for the proposed  anesthesia with the patient or authorized representative who has indicated his/her understanding and acceptance.     Plan Discussed with: CRNA  Anesthesia Plan Comments:        Anesthesia Quick Evaluation

## 2018-12-01 NOTE — Op Note (Signed)
Procedure: Electrical Cardioversion Indications:  Atrial Fibrillation  Procedure Details:  Consent: Risks of procedure as well as the alternatives and risks of each were explained to the (patient/caregiver).  Consent for procedure obtained.  Time Out: Verified patient identification, verified procedure, site/side was marked, verified correct patient position, special equipment/implants available, medications/allergies/relevent history reviewed, required imaging and test results available.  Performed  Patient placed on cardiac monitor, pulse oximetry, supplemental oxygen as necessary.  Sedation given: propofol 100 mg IV, Dr. Edwinna Areola Pacer pads placed anterior and posterior chest.  Cardioverted 1 time(s).  Cardioversion with synchronized biphasic 120J shock.  Evaluation: Findings: Post procedure EKG shows: NSR Complications: None Patient did tolerate procedure well.  Time Spent Directly with the Patient:  30  minutes   Dewan Emond 12/01/2018, 1:14 PM

## 2018-12-01 NOTE — Anesthesia Postprocedure Evaluation (Signed)
Anesthesia Post Note  Patient: Shawn Sullivan  Procedure(s) Performed: CARDIOVERSION (N/A )     Patient location during evaluation: PACU Anesthesia Type: General Level of consciousness: awake and alert Pain management: pain level controlled Vital Signs Assessment: post-procedure vital signs reviewed and stable Respiratory status: spontaneous breathing, nonlabored ventilation and respiratory function stable Cardiovascular status: blood pressure returned to baseline and stable Postop Assessment: no apparent nausea or vomiting Anesthetic complications: no    Last Vitals:  Vitals:   12/01/18 1335 12/01/18 1345  BP: 128/80 (!) 143/71  Pulse: 63 63  Resp: 16 16  Temp:    SpO2: 96% 96%    Last Pain:  Vitals:   12/01/18 1320  TempSrc: Oral  PainSc: 0-No pain                 Brennan Bailey

## 2018-12-01 NOTE — Transfer of Care (Signed)
Immediate Anesthesia Transfer of Care Note  Patient: Shawn Sullivan  Procedure(s) Performed: CARDIOVERSION (N/A )  Patient Location: Endoscopy Unit  Anesthesia Type:General  Level of Consciousness: drowsy and patient cooperative  Airway & Oxygen Therapy: Patient Spontanous Breathing  Post-op Assessment: Report given to RN and Post -op Vital signs reviewed and stable  Post vital signs: Reviewed and stable  Last Vitals:  Vitals Value Taken Time  BP    Temp    Pulse    Resp    SpO2      Last Pain:  Vitals:   12/01/18 1226  TempSrc: Oral  PainSc: 0-No pain         Complications: No apparent anesthesia complications

## 2018-12-02 ENCOUNTER — Encounter (HOSPITAL_COMMUNITY): Payer: Self-pay | Admitting: Cardiovascular Disease

## 2018-12-13 ENCOUNTER — Telehealth: Payer: Self-pay | Admitting: Cardiology

## 2018-12-13 NOTE — Telephone Encounter (Signed)
Wants samples of eliquis °

## 2018-12-13 NOTE — Telephone Encounter (Signed)
Left message for patient's wife, Hassan Rowan, to return call per [facility].

## 2018-12-15 DIAGNOSIS — Z6841 Body Mass Index (BMI) 40.0 and over, adult: Secondary | ICD-10-CM | POA: Diagnosis not present

## 2018-12-15 DIAGNOSIS — E1121 Type 2 diabetes mellitus with diabetic nephropathy: Secondary | ICD-10-CM | POA: Diagnosis not present

## 2018-12-15 DIAGNOSIS — I4811 Longstanding persistent atrial fibrillation: Secondary | ICD-10-CM | POA: Diagnosis not present

## 2018-12-15 DIAGNOSIS — E1142 Type 2 diabetes mellitus with diabetic polyneuropathy: Secondary | ICD-10-CM | POA: Diagnosis not present

## 2018-12-15 DIAGNOSIS — I129 Hypertensive chronic kidney disease with stage 1 through stage 4 chronic kidney disease, or unspecified chronic kidney disease: Secondary | ICD-10-CM | POA: Diagnosis not present

## 2018-12-15 DIAGNOSIS — E782 Mixed hyperlipidemia: Secondary | ICD-10-CM | POA: Diagnosis not present

## 2018-12-15 DIAGNOSIS — J410 Simple chronic bronchitis: Secondary | ICD-10-CM | POA: Diagnosis not present

## 2018-12-16 NOTE — Telephone Encounter (Signed)
Spoke with Hassan Rowan and advised her that samples of eliquis would be ready for pick up in the [place] office. She states she will stop by to pick them up on Monday. Informed her about the eliquis patient assistance program to try to decrease the cost of eliquis. Hassan Rowan is agreeable to fill out paperwork when she picks up samples on Monday and she will bring proof of income for [place] to copy and send with application for processing. No further questions.

## 2018-12-21 ENCOUNTER — Ambulatory Visit: Payer: Self-pay | Admitting: Cardiology

## 2018-12-22 ENCOUNTER — Ambulatory Visit (INDEPENDENT_AMBULATORY_CARE_PROVIDER_SITE_OTHER): Payer: Medicare HMO | Admitting: Cardiology

## 2018-12-22 ENCOUNTER — Encounter: Payer: Self-pay | Admitting: Cardiology

## 2018-12-22 VITALS — BP 178/84 | HR 62 | Ht 69.0 in | Wt 325.0 lb

## 2018-12-22 DIAGNOSIS — Z79899 Other long term (current) drug therapy: Secondary | ICD-10-CM

## 2018-12-22 DIAGNOSIS — N183 Chronic kidney disease, stage 3 unspecified: Secondary | ICD-10-CM

## 2018-12-22 DIAGNOSIS — G4733 Obstructive sleep apnea (adult) (pediatric): Secondary | ICD-10-CM

## 2018-12-22 DIAGNOSIS — I10 Essential (primary) hypertension: Secondary | ICD-10-CM | POA: Diagnosis not present

## 2018-12-22 DIAGNOSIS — I4819 Other persistent atrial fibrillation: Secondary | ICD-10-CM | POA: Diagnosis not present

## 2018-12-22 MED ORDER — AMIODARONE HCL 200 MG PO TABS: 200.0000 mg | ORAL_TABLET | Freq: Every day | ORAL | 3 refills | Status: DC

## 2018-12-22 MED ORDER — TORSEMIDE 20 MG PO TABS: 20.0000 mg | ORAL_TABLET | Freq: Two times a day (BID) | ORAL | 3 refills | Status: DC

## 2018-12-22 NOTE — Patient Instructions (Addendum)
Medication Instructions:  Your physician has recommended you make the following change in your medication:   INCREASE torsemide (demadex) 20 mg: Take 1 tablet twice daily. Take an extra tablet (20 mg) in the afternoon on Monday, Wednesday, Friday  DECREASE amiodarone (pacerone) 200 mg: Take 1 tablet daily   If you need a refill on your cardiac medications before your next appointment, please call your pharmacy.   Lab work: Your physician recommends that you return for lab work on Monday, 12/26/2018: CMP, TSH, T3, T4. Please have lab work done at Dr. [employer] office. No need to fast beforehand.   If you have labs (blood work) drawn today and your tests are completely normal, you will receive your results only by: [name] MyChart Message (if you have MyChart) OR . A paper copy in the mail If you have any lab test that is abnormal or we need to change your treatment, we will call you to review the results.  Testing/Procedures: You had an EKG today.   Follow-Up: At [facility], you and your health needs are our priority.  As part of our continuing mission to provide you with exceptional heart care, we have created designated Provider Care Teams.  These Care Teams include your primary Cardiologist (physician) and Advanced Practice Providers (APPs -  Physician Assistants and Nurse Practitioners) who all work together to provide you with the care you need, when you need it. You will need a follow up appointment in 3 months.  Please call our office 2 months in advance to schedule this appointment.

## 2018-12-22 NOTE — Progress Notes (Signed)
Cardiology Office Note:    Date:  12/22/2018   ID:  Shawn Sullivan, DOB [birth year], MRN [id number]  PCP:  Rochel Brome, MD  Cardiologist:  Shirlee More, MD    Referring MD: Rochel Brome, MD    ASSESSMENT:    1. Other persistent atrial fibrillation   2. Benign essential hypertension   3. Obstructive sleep apnea   4. CKD (chronic kidney disease) stage 3, GFR 30-59 ml/min (HCC)   5. On amiodarone therapy    PLAN:    In order of problems listed above:  1. Improved maintaining sinus rhythm reduce amiodarone continue his anticoagulant last Monday for her to have thyroid liver function checked and will need to follow-up every 3 months on amiodarone treatment 2. Stable blood pressure target continue current treatment 3. Stable treated with CPAP 4. Sounds like he was worsened diuretics are decreased he is more edematous I have him take an extra 20 mg of torsemide Monday Wednesday and Fridays follow-up labs Monday Dr. Tobie Poet which is 5. Continue low-dose amiodarone to prevent recurrent atrial fibrillation   Next appointment: 3 months   Medication Adjustments/Labs and Tests Ordered: Current medicines are reviewed at length with the patient today.  Concerns regarding medicines are outlined above.  No orders of the defined types were placed in this encounter.  No orders of the defined types were placed in this encounter.   No chief complaint on file.   History of Present Illness:    Shawn Sullivan is a 73 y.o. male with a hx of  persistent atrial fibrillation hypertension chronic kidney disease 3 COPD and obstructive sleep apnea  He was last seen 11/16/18 prior to successful cardioversion to [facility]. Compliance with diet, lifestyle and medications: Yes  He was in is immediately improved after cardioversion no palpitation shortness of breath orthopnea syncope or TIA no bleeding complication of his anticoagulant.  He had laboratories checked diuretic was decreased potassium was  stopped and now is more edematous. Past Medical History:  Diagnosis Date  . Anticoagulated 07/01/2018  . Anticoagulated 07/01/2018  . Benign essential hypertension 05/06/2013  . Chronic obstructive pulmonary disease ([place]) 11/05/2015  . CKD (chronic kidney disease) stage 2, GFR 60-89 ml/min 07/01/2018  . COPD (chronic obstructive pulmonary disease) ([place]) 07/01/2018  . Dermatochalasis 12/16/2015  . Disorder of bursae and tendons in shoulder region 01/05/2014  . Generalized edema 06/22/2016  . Hyperlipidemia 12/16/2015  . Hypertensive heart disease 07/01/2018  . Low back pain 08/17/2013  . Lumbosacral spondylosis 05/06/2013  . Obstructive sleep apnea 07/01/2018  . Persistent atrial fibrillation 07/01/2018  . Piriformis syndrome 05/03/2014  . Postlaminectomy syndrome, lumbar region 05/06/2013  . Presbyopia of both eyes 12/16/2015  . Restless legs syndrome 03/23/2016  . Restrictive lung disease 06/22/2016  . Sleep apnea 05/06/2013  . Type 2 diabetes mellitus without complications ([place]) 23/01/75    Past Surgical History:  Procedure Laterality Date  . APPENDECTOMY    . BACK SURGERY    . CARDIOVERSION N/A 12/01/2018   Procedure: CARDIOVERSION;  Surgeon: Sanda Klein, MD;  Location: [facility];  Service: Cardiovascular;  Laterality: N/A;  . NECK SURGERY      Current Medications: Current Meds  Medication Sig  . acetaminophen (TYLENOL) 500 MG tablet Take 1,000 mg by mouth every 6 (six) hours as needed for moderate pain or headache.  . albuterol (PROVENTIL HFA;VENTOLIN HFA) 108 (90 Base) MCG/ACT inhaler Inhale 1-2 puffs into the lungs every 4 (four) hours as needed for wheezing or shortness of  breath.   [name] amiodarone (PACERONE) 200 MG tablet Take 1 tablet (200 mg total) by mouth 2 (two) times daily.  [name] apixaban (ELIQUIS) 5 MG TABS tablet Take 5 mg by mouth 2 (two) times daily.   [name] atorvastatin (LIPITOR) 40 MG tablet Take 40 mg by mouth daily.  [name] CINNAMON PO Take 700 mg by mouth daily.   . Cyanocobalamin (B-12)  5000 MCG CAPS Take 5,000 mcg by mouth daily.  . Fluticasone-Umeclidin-Vilant (TRELEGY ELLIPTA) 100-62.5-25 MCG/INH AEPB Inhale 1 puff into the lungs daily.  [name] glipiZIDE (GLUCOTROL) 10 MG tablet Take 10 mg by mouth See admin instructions. Take 10 mg in the morning, may take a second 10 mg dose as needed for blood sugar over 190  . HYDROcodone-acetaminophen (NORCO) 10-325 MG tablet Take 1 tablet by mouth every 4 (four) hours as needed for moderate pain.   . hydrocortisone cream 1 % Apply 1 application topically daily as needed for itching.  . Insulin Glargine-Lixisenatide (SOLIQUA) 100-33 UNT-MCG/ML SOPN Inject 30 Units into the skin daily.  [name] ipratropium-albuterol (DUONEB) 0.5-2.5 (3) MG/3ML SOLN Take 3 mLs by nebulization every 6 (six) hours as needed (shortness of breath).   [name] levocetirizine (XYZAL) 5 MG tablet Take 5 mg by mouth daily as needed for allergies.  . magnesium chloride (SLOW-MAG) 64 MG TBEC SR tablet Take 1 tablet (64 mg total) by mouth 2 (two) times daily. (Patient taking differently: Take 1 tablet by mouth daily. )  . metoprolol tartrate (LOPRESSOR) 50 MG tablet Take 1 tablet (50 mg total) by mouth every 6 (six) hours.  . montelukast (SINGULAIR) 10 MG tablet Take 10 mg by mouth at bedtime.  [name] omeprazole (PRILOSEC) 20 MG capsule Take 20 mg by mouth 2 (two) times daily before a meal.   . pyridOXINE (VITAMIN B-6) 100 MG tablet Take 100 mg by mouth daily.  . sertraline (ZOLOFT) 50 MG tablet Take 50 mg by mouth 2 (two) times daily.  . tamsulosin (FLOMAX) 0.4 MG CAPS capsule Take 0.4 mg by mouth daily.  [name] torsemide (DEMADEX) 20 MG tablet Take 20 mg by mouth 2 (two) times daily.     Allergies:   Patient has no known allergies.   Social History   Socioeconomic History  . Marital status: Married    Spouse name: Not on file  . Number of children: Not on file  . Years of education: Not on file  . Highest education level: Not on file  Occupational History  . Not on file  Social  Needs  . Financial resource strain: Not on file  . Food insecurity:    Worry: Not on file    Inability: Not on file  . Transportation needs:    Medical: Not on file    Non-medical: Not on file  Tobacco Use  . Smoking status: Former Research scientist (life sciences)  . Smokeless tobacco: Never Used  Substance and Sexual Activity  . Alcohol use: Not Currently    Frequency: Never  . Drug use: Not Currently  . Sexual activity: Not on file  Lifestyle  . Physical activity:    Days per week: Not on file    Minutes per session: Not on file  . Stress: Not on file  Relationships  . Social connections:    Talks on phone: Not on file    Gets together: Not on file    Attends religious service: Not on file    Active member of club or organization: Not on file    Attends  meetings of clubs or organizations: Not on file    Relationship status: Not on file  Other Topics Concern  . Not on file  Social History Narrative  . Not on file     Family History: The patient's family history includes Atrial fibrillation in his mother; Cancer in his mother; Heart disease in his father. ROS:   Please see the history of present illness.    All other systems reviewed and are negative.  EKGs/Labs/Other Studies Reviewed:    The following studies were reviewed today:  EKG:  EKG ordered today.  The ekg ordered today demonstrates sinus rhythm  Recent Labs: 10/04/2018: Magnesium 2.3 11/25/2018: BUN 16; Creatinine, Ser 1.57; Hemoglobin 14.5; NT-Pro BNP 678; Platelets 216; Potassium 4.0; Sodium 143  Recent Lipid Panel No results found for: CHOL, TRIG, HDL, CHOLHDL, VLDL, LDLCALC, LDLDIRECT  Physical Exam:    VS:  BP (!) 178/84 (BP Location: Right Arm, Patient Position: Sitting, Cuff Size: Large)   Pulse 62   Ht 5\' 9"  (1.753 m)   Wt (!) 325 lb (147.4 kg)   SpO2 96%   BMI 47.99 kg/m     Wt Readings from Last 3 Encounters:  12/22/18 (!) 325 lb (147.4 kg)  12/01/18 (!) 319 lb (144.7 kg)  11/16/18 (!) 319 lb 6 oz (144.9 kg)      GEN:  Well nourished, well developed in no acute distress HEENT: Normal NECK: No JVD; No carotid bruits LYMPHATICS: No lymphadenopathy CARDIAC: RRR, no murmurs, rubs, gallops RESPIRATORY:  Clear to auscultation without rales, wheezing or rhonchi  ABDOMEN: Soft, non-tender, non-distended MUSCULOSKELETAL:  2+ bilteral to the knee edema; No deformity  SKIN: Warm and dry NEUROLOGIC:  Alert and oriented x 3 PSYCHIATRIC:  Normal affect    Signed, Shirlee More, MD  12/22/2018 3:42 PM    [facility]

## 2018-12-26 DIAGNOSIS — Z79899 Other long term (current) drug therapy: Secondary | ICD-10-CM | POA: Diagnosis not present

## 2018-12-26 DIAGNOSIS — I10 Essential (primary) hypertension: Secondary | ICD-10-CM | POA: Diagnosis not present

## 2018-12-26 DIAGNOSIS — N183 Chronic kidney disease, stage 3 (moderate): Secondary | ICD-10-CM | POA: Diagnosis not present

## 2018-12-26 DIAGNOSIS — I4819 Other persistent atrial fibrillation: Secondary | ICD-10-CM | POA: Diagnosis not present

## 2018-12-29 DIAGNOSIS — E782 Mixed hyperlipidemia: Secondary | ICD-10-CM | POA: Diagnosis not present

## 2018-12-29 DIAGNOSIS — E1142 Type 2 diabetes mellitus with diabetic polyneuropathy: Secondary | ICD-10-CM | POA: Diagnosis not present

## 2018-12-29 DIAGNOSIS — I4811 Longstanding persistent atrial fibrillation: Secondary | ICD-10-CM | POA: Diagnosis not present

## 2019-01-04 DIAGNOSIS — J449 Chronic obstructive pulmonary disease, unspecified: Secondary | ICD-10-CM | POA: Diagnosis not present

## 2019-01-04 DIAGNOSIS — Z6841 Body Mass Index (BMI) 40.0 and over, adult: Secondary | ICD-10-CM | POA: Diagnosis not present

## 2019-01-04 DIAGNOSIS — G4733 Obstructive sleep apnea (adult) (pediatric): Secondary | ICD-10-CM | POA: Diagnosis not present

## 2019-01-16 DIAGNOSIS — Z961 Presence of intraocular lens: Secondary | ICD-10-CM | POA: Diagnosis not present

## 2019-01-16 DIAGNOSIS — H02831 Dermatochalasis of right upper eyelid: Secondary | ICD-10-CM | POA: Diagnosis not present

## 2019-01-16 DIAGNOSIS — H43813 Vitreous degeneration, bilateral: Secondary | ICD-10-CM | POA: Diagnosis not present

## 2019-01-16 DIAGNOSIS — H11153 Pinguecula, bilateral: Secondary | ICD-10-CM | POA: Diagnosis not present

## 2019-01-16 DIAGNOSIS — E119 Type 2 diabetes mellitus without complications: Secondary | ICD-10-CM | POA: Diagnosis not present

## 2019-01-16 DIAGNOSIS — H0288B Meibomian gland dysfunction left eye, upper and lower eyelids: Secondary | ICD-10-CM | POA: Diagnosis not present

## 2019-01-16 DIAGNOSIS — Z794 Long term (current) use of insulin: Secondary | ICD-10-CM | POA: Diagnosis not present

## 2019-01-16 DIAGNOSIS — H04123 Dry eye syndrome of bilateral lacrimal glands: Secondary | ICD-10-CM | POA: Diagnosis not present

## 2019-01-16 DIAGNOSIS — H02834 Dermatochalasis of left upper eyelid: Secondary | ICD-10-CM | POA: Diagnosis not present

## 2019-01-16 DIAGNOSIS — H0288A Meibomian gland dysfunction right eye, upper and lower eyelids: Secondary | ICD-10-CM | POA: Diagnosis not present

## 2019-01-24 DIAGNOSIS — G894 Chronic pain syndrome: Secondary | ICD-10-CM | POA: Diagnosis not present

## 2019-01-24 DIAGNOSIS — G63 Polyneuropathy in diseases classified elsewhere: Secondary | ICD-10-CM | POA: Diagnosis not present

## 2019-01-24 DIAGNOSIS — M961 Postlaminectomy syndrome, not elsewhere classified: Secondary | ICD-10-CM | POA: Diagnosis not present

## 2019-01-25 DIAGNOSIS — I4811 Longstanding persistent atrial fibrillation: Secondary | ICD-10-CM | POA: Diagnosis not present

## 2019-01-25 DIAGNOSIS — R600 Localized edema: Secondary | ICD-10-CM | POA: Diagnosis not present

## 2019-01-25 DIAGNOSIS — E1142 Type 2 diabetes mellitus with diabetic polyneuropathy: Secondary | ICD-10-CM | POA: Diagnosis not present

## 2019-01-25 DIAGNOSIS — I129 Hypertensive chronic kidney disease with stage 1 through stage 4 chronic kidney disease, or unspecified chronic kidney disease: Secondary | ICD-10-CM | POA: Diagnosis not present

## 2019-01-25 DIAGNOSIS — E782 Mixed hyperlipidemia: Secondary | ICD-10-CM | POA: Diagnosis not present

## 2019-01-25 DIAGNOSIS — N183 Chronic kidney disease, stage 3 (moderate): Secondary | ICD-10-CM | POA: Diagnosis not present

## 2019-01-25 DIAGNOSIS — E876 Hypokalemia: Secondary | ICD-10-CM | POA: Diagnosis not present

## 2019-01-25 DIAGNOSIS — E669 Obesity, unspecified: Secondary | ICD-10-CM | POA: Diagnosis not present

## 2019-02-26 ENCOUNTER — Other Ambulatory Visit: Payer: Self-pay | Admitting: Cardiology

## 2019-03-01 DIAGNOSIS — E1122 Type 2 diabetes mellitus with diabetic chronic kidney disease: Secondary | ICD-10-CM | POA: Diagnosis not present

## 2019-03-01 DIAGNOSIS — I4811 Longstanding persistent atrial fibrillation: Secondary | ICD-10-CM | POA: Diagnosis not present

## 2019-03-01 DIAGNOSIS — E782 Mixed hyperlipidemia: Secondary | ICD-10-CM | POA: Diagnosis not present

## 2019-03-17 DIAGNOSIS — J410 Simple chronic bronchitis: Secondary | ICD-10-CM | POA: Diagnosis not present

## 2019-03-17 DIAGNOSIS — Z6841 Body Mass Index (BMI) 40.0 and over, adult: Secondary | ICD-10-CM | POA: Diagnosis not present

## 2019-03-17 DIAGNOSIS — I4811 Longstanding persistent atrial fibrillation: Secondary | ICD-10-CM | POA: Diagnosis not present

## 2019-03-17 DIAGNOSIS — E1142 Type 2 diabetes mellitus with diabetic polyneuropathy: Secondary | ICD-10-CM | POA: Diagnosis not present

## 2019-03-17 DIAGNOSIS — E782 Mixed hyperlipidemia: Secondary | ICD-10-CM | POA: Diagnosis not present

## 2019-03-17 DIAGNOSIS — I129 Hypertensive chronic kidney disease with stage 1 through stage 4 chronic kidney disease, or unspecified chronic kidney disease: Secondary | ICD-10-CM | POA: Diagnosis not present

## 2019-03-17 DIAGNOSIS — R69 Illness, unspecified: Secondary | ICD-10-CM | POA: Diagnosis not present

## 2019-03-20 ENCOUNTER — Telehealth: Payer: Self-pay

## 2019-03-20 NOTE — Telephone Encounter (Signed)
Left message for patient to return call to schedule virtual visit as he is due to see Dr Bettina Gavia in April.

## 2019-03-21 DIAGNOSIS — E1121 Type 2 diabetes mellitus with diabetic nephropathy: Secondary | ICD-10-CM | POA: Diagnosis not present

## 2019-03-21 DIAGNOSIS — R69 Illness, unspecified: Secondary | ICD-10-CM | POA: Diagnosis not present

## 2019-03-21 NOTE — Telephone Encounter (Signed)
YOUR CARDIOLOGY TEAM HAS ARRANGED FOR AN E-VISIT FOR YOUR APPOINTMENT - PLEASE REVIEW IMPORTANT INFORMATION BELOW SEVERAL DAYS PRIOR TO YOUR APPOINTMENT  Due to the recent COVID-19 pandemic, we are transitioning in-person office visits to tele-medicine visits in an effort to decrease unnecessary exposure to our patients, their families, and staff. These visits are billed to your insurance just like a normal visit is. We also encourage you to sign up for MyChart if you have not already done so. You will need a smartphone if possible. For patients that do not have this, we can still complete the visit using a regular telephone but do prefer a smartphone to enable video when possible. You may have a family member that lives with you that can help. If possible, we also ask that you have a blood pressure cuff and scale at home to measure your blood pressure, heart rate and weight prior to your scheduled appointment. Patients with clinical needs that need an in-person evaluation and testing will still be able to come to the office if absolutely necessary. If you have any questions, feel free to call our office.     YOUR PROVIDER WILL BE USING THE FOLLOWING PLATFORM TO COMPLETE YOUR VISIT: Televisit    2-3 DAYS BEFORE YOUR APPOINTMENT  You will receive a telephone call from one of our [place] team members - your caller ID may say "Unknown caller." If this is a video visit, we will walk you through how to get the video launched on your phone. We will remind you check your blood pressure, heart rate and weight prior to your scheduled appointment. If you have an Apple Watch or Kardia, please upload any pertinent ECG strips the day before or morning of your appointment to [facility]. Our staff will also make sure you have reviewed the consent and agree to move forward with your scheduled tele-health visit.     THE DAY OF YOUR APPOINTMENT  Approximately 15 minutes prior to your scheduled appointment, you will  receive a telephone call from one of [facility] team - your caller ID may say "Unknown caller."  Our staff will confirm medications, vital signs for the day and any symptoms you may be experiencing. Please have this information available prior to the time of visit start. It may also be helpful for you to have a pad of paper and pen handy for any instructions given during your visit. They will also walk you through joining the smartphone meeting if this is a video visit.    CONSENT FOR TELE-HEALTH VISIT - PLEASE REVIEW  I hereby voluntarily request, consent and authorize [facility] and its employed or contracted physicians, physician assistants, nurse practitioners or other licensed health care professionals (the Practitioner), to provide me with telemedicine health care services (the "Services") as deemed necessary by the treating Practitioner. I acknowledge and consent to receive the Services by the Practitioner via telemedicine. I understand that the telemedicine visit will involve communicating with the Practitioner through live audiovisual communication technology and the disclosure of certain medical information by electronic transmission. I acknowledge that I have been given the opportunity to request an in-person assessment or other available alternative prior to the telemedicine visit and am voluntarily participating in the telemedicine visit.  I understand that I have the right to withhold or withdraw my consent to the use of telemedicine in the course of my care at any time, without affecting my right to future care or treatment, and that the Practitioner or I may terminate  the telemedicine visit at any time. I understand that I have the right to inspect all information obtained and/or recorded in the course of the telemedicine visit and may receive copies of available information for a reasonable fee.  I understand that some of the potential risks of receiving the Services via telemedicine  include:  [name] Delay or interruption in medical evaluation due to technological equipment failure or disruption; . Information transmitted may not be sufficient (e.g. poor resolution of images) to allow for appropriate medical decision making by the Practitioner; and/or  . In rare instances, security protocols could fail, causing a breach of personal health information.  Furthermore, I acknowledge that it is my responsibility to provide information about my medical history, conditions and care that is complete and accurate to the best of my ability. I acknowledge that Practitioner's advice, recommendations, and/or decision may be based on factors not within their control, such as incomplete or inaccurate data provided by me or distortions of diagnostic images or specimens that may result from electronic transmissions. I understand that the practice of medicine is not an exact science and that Practitioner makes no warranties or guarantees regarding treatment outcomes. I acknowledge that I will receive a copy of this consent concurrently upon execution via email to the email address I last provided but may also request a printed copy by calling the office of [facility].    I understand that my insurance will be billed for this visit.   I have read or had this consent read to me. . I understand the contents of this consent, which adequately explains the benefits and risks of the Services being provided via telemedicine.  . I have been provided ample opportunity to ask questions regarding this consent and the Services and have had my questions answered to my satisfaction. . I give my informed consent for the services to be provided through the use of telemedicine in my medical care  By participating in this telemedicine visit I agree to the above.   Patient and his wife gave verbal consent for televisit with Dr Bettina Gavia on 03-24-2019

## 2019-03-24 ENCOUNTER — Other Ambulatory Visit: Payer: Self-pay

## 2019-03-24 ENCOUNTER — Telehealth (INDEPENDENT_AMBULATORY_CARE_PROVIDER_SITE_OTHER): Payer: Medicare HMO | Admitting: Cardiology

## 2019-03-24 ENCOUNTER — Encounter: Payer: Self-pay | Admitting: Cardiology

## 2019-03-24 VITALS — BP 171/89 | HR 64 | Ht 69.0 in | Wt 327.0 lb

## 2019-03-24 DIAGNOSIS — Z7901 Long term (current) use of anticoagulants: Secondary | ICD-10-CM

## 2019-03-24 DIAGNOSIS — I5032 Chronic diastolic (congestive) heart failure: Secondary | ICD-10-CM

## 2019-03-24 DIAGNOSIS — Z79899 Other long term (current) drug therapy: Secondary | ICD-10-CM

## 2019-03-24 DIAGNOSIS — N183 Chronic kidney disease, stage 3 unspecified: Secondary | ICD-10-CM

## 2019-03-24 DIAGNOSIS — I480 Paroxysmal atrial fibrillation: Secondary | ICD-10-CM

## 2019-03-24 DIAGNOSIS — I110 Hypertensive heart disease with heart failure: Secondary | ICD-10-CM

## 2019-03-24 DIAGNOSIS — J449 Chronic obstructive pulmonary disease, unspecified: Secondary | ICD-10-CM

## 2019-03-24 DIAGNOSIS — G4733 Obstructive sleep apnea (adult) (pediatric): Secondary | ICD-10-CM

## 2019-03-24 HISTORY — DX: Other long term (current) drug therapy: Z79.899

## 2019-03-24 NOTE — Progress Notes (Signed)
Virtual Visit via Telephone Note   This visit type was conducted due to national recommendations for restrictions regarding the COVID-19 Pandemic (e.g. social distancing) in an effort to limit this patient's exposure and mitigate transmission in our community.  Due to his co-morbid illnesses, this patient is at least at moderate risk for complications without adequate follow up.  This format is felt to be most appropriate for this patient at this time.  The patient did not have access to video technology/had technical difficulties with video requiring transitioning to audio format only (telephone).  All issues noted in this document were discussed and addressed.  No physical exam could be performed with this format.  Please refer to the patient's chart for his  consent to telehealth for [facility].   Evaluation Performed:  Follow-up visit  Date:  03/24/2019   ID:  Shawn Sullivan, Shawn Sullivan [birth year], MRN [id number]  Patient Location: Home Provider Location: Home  PCP:  Rochel Brome, MD  Cardiologist:  No primary care provider on file. [facility] Electrophysiologist:  None   Chief Complaint: Atrial fibrillation on amiodarone and heart failure  History of Present Illness:    Shawn Sullivan is a 73 y.o. male with a history of paroxysmal atrial fibrillation on amiodarone hypertension chronic kidney disease 3 COPD and obstructive sleep apnea last seen 0123/20.  Unfortunately he has no Internet access or smart phone.  Although his weight is up 25 pounds in the last 6 months he said that he is not short of breath and he does not have edema orthopnea.  No chest pain palpitation or syncope no bleeding complication of his anticoagulant he checks his heart rate and blood pressure at home digital device) 60 to 70 bpm and no alarm for irregular heart rhythm.  Overall he is pleased with the quality of his life.  The patient does not have symptoms concerning for COVID-19 infection (fever, chills,  cough, or new shortness of breath).    Past Medical History:  Diagnosis Date  . Anticoagulated 07/01/2018  . Anticoagulated 07/01/2018  . Benign essential hypertension 05/06/2013  . Chronic obstructive pulmonary disease ([place]) 11/05/2015  . CKD (chronic kidney disease) stage 2, GFR 60-89 ml/min 07/01/2018  . COPD (chronic obstructive pulmonary disease) ([place]) 07/01/2018  . Dermatochalasis 12/16/2015  . Disorder of bursae and tendons in shoulder region 01/05/2014  . Generalized edema 06/22/2016  . Hyperlipidemia 12/16/2015  . Hypertensive heart disease 07/01/2018  . Low back pain 08/17/2013  . Lumbosacral spondylosis 05/06/2013  . Obstructive sleep apnea 07/01/2018  . Persistent atrial fibrillation 07/01/2018  . Piriformis syndrome 05/03/2014  . Postlaminectomy syndrome, lumbar region 05/06/2013  . Presbyopia of both eyes 12/16/2015  . Restless legs syndrome 03/23/2016  . Restrictive lung disease 06/22/2016  . Sleep apnea 05/06/2013  . Type 2 diabetes mellitus without complications ([place]) 40/07/8118   Past Surgical History:  Procedure Laterality Date  . APPENDECTOMY    . BACK SURGERY    . CARDIOVERSION N/A 12/01/2018   Procedure: CARDIOVERSION;  Surgeon: Sanda Klein, MD;  Location: [facility];  Service: Cardiovascular;  Laterality: N/A;  . NECK SURGERY       Current Meds  Medication Sig  . acetaminophen (TYLENOL) 500 MG tablet Take 1,000 mg by mouth every 6 (six) hours as needed for moderate pain or headache.  . albuterol (PROVENTIL HFA;VENTOLIN HFA) 108 (90 Base) MCG/ACT inhaler Inhale 1-2 puffs into the lungs every 4 (four) hours as needed for wheezing or shortness of breath.   [name]  amiodarone (PACERONE) 200 MG tablet Take 1 tablet (200 mg total) by mouth daily.  [name] apixaban (ELIQUIS) 5 MG TABS tablet Take 5 mg by mouth 2 (two) times daily.   [name] atorvastatin (LIPITOR) 40 MG tablet Take 40 mg by mouth daily.  [name] CINNAMON PO Take 700 mg by mouth daily.   . Cyanocobalamin (B-12) 5000 MCG CAPS Take 5,000 mcg by  mouth daily.  . Fluticasone-Umeclidin-Vilant (TRELEGY ELLIPTA) 100-62.5-25 MCG/INH AEPB Inhale 1 puff into the lungs daily.  [name] glipiZIDE (GLUCOTROL) 10 MG tablet Take 10 mg by mouth See admin instructions. Take 10 mg in the morning, may take a second 10 mg dose as needed for blood sugar over 190  . HYDROcodone-acetaminophen (NORCO) 10-325 MG tablet Take 1 tablet by mouth every 4 (four) hours as needed for moderate pain.   . hydrocortisone cream 1 % Apply 1 application topically daily as needed for itching.  . Insulin Glargine-Lixisenatide (SOLIQUA) 100-33 UNT-MCG/ML SOPN Inject 30 Units into the skin daily.  [name] ipratropium-albuterol (DUONEB) 0.5-2.5 (3) MG/3ML SOLN Take 3 mLs by nebulization every 6 (six) hours as needed (shortness of breath).   [name] levocetirizine (XYZAL) 5 MG tablet Take 5 mg by mouth daily as needed for allergies.  . magnesium chloride (SLOW-MAG) 64 MG TBEC SR tablet Take 1 tablet (64 mg total) by mouth 2 (two) times daily. (Patient taking differently: Take 1 tablet by mouth daily. )  . metoprolol tartrate (LOPRESSOR) 50 MG tablet TAKE 1 TABLET BY MOUTH EVERY 6 HOURS  . montelukast (SINGULAIR) 10 MG tablet Take 10 mg by mouth at bedtime.  [name] omeprazole (PRILOSEC) 20 MG capsule Take 20 mg by mouth 2 (two) times daily before a meal.   . pyridOXINE (VITAMIN B-6) 100 MG tablet Take 100 mg by mouth daily.  . sertraline (ZOLOFT) 50 MG tablet Take 50 mg by mouth 2 (two) times daily.  . tamsulosin (FLOMAX) 0.4 MG CAPS capsule Take 0.4 mg by mouth daily.  [name] torsemide (DEMADEX) 20 MG tablet Take 1 tablet (20 mg total) by mouth 2 (two) times daily. Take an extra tablet (20 mg) in the afternoon on Monday, Wednesday, Friday.     Allergies:   Patient has no known allergies.   Social History   Tobacco Use  . Smoking status: Former Research scientist (life sciences)  . Smokeless tobacco: Never Used  Substance Use Topics  . Alcohol use: Not Currently    Frequency: Never  . Drug use: Not Currently     Family Hx:  The patient's family history includes Atrial fibrillation in his mother; Cancer in his mother; Heart disease in his father.  ROS:   Please see the history of present illness.     All other systems reviewed and are negative.   Prior CV studies:   The following studies were reviewed today:    Labs/Other Tests and Data Reviewed:    EKG:  No ECG reviewed.  Recent Labs: I did review the results with the patient during the visit 10/04/2018: Magnesium 2.3 11/25/2018: BUN 16; Creatinine, Ser 1.57; Hemoglobin 14.5; NT-Pro BNP 678; Platelets 216; Potassium 4.0; Sodium 143   Recent Lipid Panel No results found for: CHOL, TRIG, HDL, CHOLHDL, LDLCALC, LDLDIRECT  Wt Readings from Last 3 Encounters:  03/24/19 (!) 327 lb (148.3 kg)  12/22/18 (!) 325 lb (147.4 kg)  12/01/18 (!) 319 lb (144.7 kg)     Objective:    Vital Signs:  BP (!) 171/89 (BP Location: Left Arm, Patient Position: Sitting)  Pulse 64   Ht 5\' 9"  (1.753 m)   Wt (!) 327 lb (148.3 kg)   BMI 48.29 kg/m    VITAL SIGNS:  reviewed his mood affect thought and cognition are normal he is alert and oriented x3 he has no audible wheezing or respiratory distress during conversation  ASSESSMENT & PLAN:    1. Atrial fibrillation paroxysmal asymptomatic stable no indication of clinical recurrence he will continue amiodarone anticoagulant and at his next visit with me in 3 months in the office I will check EKG and recheck thyroid liver function for toxicity 2. Stable continue low-dose amiodarone no evidence of toxicity 3. Moderate stroke risk continue his current anticoagulant 4. Hypertensive heart disease with heart failure stable we discussed a higher dose of diuretic he felt it was unnecessary and not be able to better evaluate him at his next visit and I will include a proBNP level.  CKD is stable recent labs stage III CKD normal potassium 5. COPD stable continue current treatment bronchodilator 6. Obesity ongoing next office  visit I discussed with him the importance of decreasing body mass with heart failure and atrial fibrillation 7. Sleep apnea stable continue with CPAP  COVID-19 Education: The signs and symptoms of COVID-19 were discussed with the patient and how to seek care for testing (follow up with PCP or arrange E-visit).  The importance of social distancing was discussed today.  Time:   Today, I have spent 24 minutes with the patient with telehealth technology discussing the above problems.     Medication Adjustments/Labs and Tests Ordered: Current medicines are reviewed at length with the patient today.  Concerns regarding medicines are outlined above.   Tests Ordered: No orders of the defined types were placed in this encounter.   Medication Changes: No orders of the defined types were placed in this encounter.   Disposition:  Follow up in 3 month(s)  Signed, Shirlee More, MD  03/24/2019 2:14 PM    [facility]

## 2019-03-24 NOTE — Patient Instructions (Signed)
Medication Instructions:  Your physician recommends that you continue on your current medications as directed. Please refer to the Current Medication list given to you today.  If you need a refill on your cardiac medications before your next appointment, please call your pharmacy.   Lab work: None  If you have labs (blood work) drawn today and your tests are completely normal, you will receive your results only by: [name] MyChart Message (if you have MyChart) OR . A paper copy in the mail If you have any lab test that is abnormal or we need to change your treatment, we will call you to review the results.  Testing/Procedures: None  Follow-Up: At [facility], you and your health needs are our priority.  As part of our continuing mission to provide you with exceptional heart care, we have created designated Provider Care Teams.  These Care Teams include your primary Cardiologist (physician) and Advanced Practice Providers (APPs -  Physician Assistants and Nurse Practitioners) who all work together to provide you with the care you need, when you need it. You will need a follow up televisit appointment in 3 months: Wednesday, 06/14/2019, at 2:30 pm.

## 2019-03-29 DIAGNOSIS — L0202 Furuncle of face: Secondary | ICD-10-CM | POA: Diagnosis not present

## 2019-04-19 DIAGNOSIS — Z79899 Other long term (current) drug therapy: Secondary | ICD-10-CM | POA: Diagnosis not present

## 2019-04-19 DIAGNOSIS — G63 Polyneuropathy in diseases classified elsewhere: Secondary | ICD-10-CM | POA: Diagnosis not present

## 2019-04-19 DIAGNOSIS — M961 Postlaminectomy syndrome, not elsewhere classified: Secondary | ICD-10-CM | POA: Diagnosis not present

## 2019-04-25 DIAGNOSIS — I4811 Longstanding persistent atrial fibrillation: Secondary | ICD-10-CM | POA: Diagnosis not present

## 2019-04-25 DIAGNOSIS — R69 Illness, unspecified: Secondary | ICD-10-CM | POA: Diagnosis not present

## 2019-04-25 DIAGNOSIS — E782 Mixed hyperlipidemia: Secondary | ICD-10-CM | POA: Diagnosis not present

## 2019-04-25 DIAGNOSIS — E1142 Type 2 diabetes mellitus with diabetic polyneuropathy: Secondary | ICD-10-CM | POA: Diagnosis not present

## 2019-04-25 DIAGNOSIS — Z6841 Body Mass Index (BMI) 40.0 and over, adult: Secondary | ICD-10-CM | POA: Diagnosis not present

## 2019-04-26 DIAGNOSIS — E1142 Type 2 diabetes mellitus with diabetic polyneuropathy: Secondary | ICD-10-CM | POA: Diagnosis not present

## 2019-04-26 DIAGNOSIS — I10 Essential (primary) hypertension: Secondary | ICD-10-CM | POA: Diagnosis not present

## 2019-04-26 DIAGNOSIS — E782 Mixed hyperlipidemia: Secondary | ICD-10-CM | POA: Diagnosis not present

## 2019-05-02 ENCOUNTER — Encounter: Payer: Self-pay | Admitting: Gastroenterology

## 2019-05-03 DIAGNOSIS — R944 Abnormal results of kidney function studies: Secondary | ICD-10-CM | POA: Diagnosis not present

## 2019-05-18 DIAGNOSIS — N183 Chronic kidney disease, stage 3 (moderate): Secondary | ICD-10-CM | POA: Diagnosis not present

## 2019-05-18 DIAGNOSIS — N189 Chronic kidney disease, unspecified: Secondary | ICD-10-CM | POA: Diagnosis not present

## 2019-05-23 DIAGNOSIS — E669 Obesity, unspecified: Secondary | ICD-10-CM | POA: Diagnosis not present

## 2019-05-23 DIAGNOSIS — R600 Localized edema: Secondary | ICD-10-CM | POA: Diagnosis not present

## 2019-05-23 DIAGNOSIS — D631 Anemia in chronic kidney disease: Secondary | ICD-10-CM | POA: Diagnosis not present

## 2019-05-23 DIAGNOSIS — N183 Chronic kidney disease, stage 3 (moderate): Secondary | ICD-10-CM | POA: Diagnosis not present

## 2019-05-23 DIAGNOSIS — I129 Hypertensive chronic kidney disease with stage 1 through stage 4 chronic kidney disease, or unspecified chronic kidney disease: Secondary | ICD-10-CM | POA: Diagnosis not present

## 2019-05-23 DIAGNOSIS — N2581 Secondary hyperparathyroidism of renal origin: Secondary | ICD-10-CM | POA: Diagnosis not present

## 2019-05-23 DIAGNOSIS — E876 Hypokalemia: Secondary | ICD-10-CM | POA: Diagnosis not present

## 2019-05-30 DIAGNOSIS — Z6841 Body Mass Index (BMI) 40.0 and over, adult: Secondary | ICD-10-CM | POA: Diagnosis not present

## 2019-05-30 DIAGNOSIS — Z0000 Encounter for general adult medical examination without abnormal findings: Secondary | ICD-10-CM | POA: Diagnosis not present

## 2019-06-01 ENCOUNTER — Other Ambulatory Visit: Payer: Self-pay | Admitting: Cardiology

## 2019-06-13 NOTE — Progress Notes (Signed)
Cardiology Office Note:    Date:  06/14/2019   ID:  Shawn Sullivan, DOB August 12, 1946, MRN [id number]  PCP:  Rochel Brome, MD  Cardiologist:  Shirlee More, MD    Referring MD: Rochel Brome, MD    ASSESSMENT:    1. PAF (paroxysmal atrial fibrillation) ([place])   2. On amiodarone therapy   3. Chronic anticoagulation   4. Hypertensive heart and kidney disease with chronic diastolic congestive heart failure and stage 3 chronic kidney disease ([place])   5. Hyperlipidemia, unspecified hyperlipidemia type   6. Obstructive sleep apnea    PLAN:    In order of problems listed above:  1. Hypertensive heart and kidney disease with chronic dCHF and CKD 3 - 05/28/19 creatinine 1.47, GFR 46. BP well controlled today. Kidney function stable on recent labs. Euvolemic on exam, no SOB no edema today. Of note, has gained weight which he attributes to lack of exercise. Continue current medications. Will check ProBNP today. Asked him to start weighing himself daily.  2. Paroxysmal atrial fibrillation - In normal sinus rhythm with RBBB today. Denies palpitations. Continue low dose amiodarone.  3. On amiodarone therapy - 04/26/19 TSH 4.24. 05/03/19 ALT 33 AST 33. No signs of toxicity, maintaining SR.  4. Chronic anticoagulation - Secondary to atrial fibrillation. Hb 13.6 on 05/18/19 by PCP. 5. HLD - 04/26/19 total 134, HDL 36, LDL 65, triglycerides 163. Statin was stopped by his PCP secondary to myalgias.  6. OSA - Compliant with his BiPAP at night.   Next appointment: 3 mos   Medication Adjustments/Labs and Tests Ordered: Current medicines are reviewed at length with the patient today.  Concerns regarding medicines are outlined above.  Orders Placed This Encounter  Procedures  . Pro b natriuretic peptide  . EKG 12-Lead   No orders of the defined types were placed in this encounter.   Chief Complaint: 73 yo male presents for 3 month follow up of persistent atrial fibrillation, HTN.  History of Present  Illness:    NICHAEL Sullivan is a 73 y.o. male with a hx of  persistent atrial fibrillation, hypertension, chronic kidney disease 3, COPD, and obstructive sleep apnea last seen virtual video visit 03/24/2019.  He reports feeling overall well. Compliant with his Eliquis and has no bleeding complications including melena, hematuria. He denies episodes of chest pain. His daughter helps him with his care by checking BP and using Kardia device. Has not had any alarms of irregular rhythms and denies palpitations, irregular heart rates.   We discussed diet and exercises. Tells me he has gained some weight, but denies changes in edema and no new onset SOB. Weight is up 15lbs from January. He attributes to not exercising and over eating. Will check ProBNP level today to assess fluid status. He has not been checking his weight daily.   Compliance with diet, lifestyle and medications: Yes. Was encouraged to lose weight and recognizes the need.  Past Medical History:  Diagnosis Date  . Anticoagulated 07/01/2018  . Anticoagulated 07/01/2018  . Benign essential hypertension 05/06/2013  . Chronic obstructive pulmonary disease ([place]) 11/05/2015  . CKD (chronic kidney disease) stage 2, GFR 60-89 ml/min 07/01/2018  . COPD (chronic obstructive pulmonary disease) ([place]) 07/01/2018  . Dermatochalasis 12/16/2015  . Disorder of bursae and tendons in shoulder region 01/05/2014  . Generalized edema 06/22/2016  . Hyperlipidemia 12/16/2015  . Hypertensive heart disease 07/01/2018  . Low back pain 08/17/2013  . Lumbosacral spondylosis 05/06/2013  . Obstructive sleep apnea 07/01/2018  .  Persistent atrial fibrillation 07/01/2018  . Piriformis syndrome 05/03/2014  . Postlaminectomy syndrome, lumbar region 05/06/2013  . Presbyopia of both eyes 12/16/2015  . Restless legs syndrome 03/23/2016  . Restrictive lung disease 06/22/2016  . Sleep apnea 05/06/2013  . Type 2 diabetes mellitus without complications ([place]) 42/04/8340    Past Surgical History:   Procedure Laterality Date  . APPENDECTOMY    . BACK SURGERY    . CARDIOVERSION N/A 12/01/2018   Procedure: CARDIOVERSION;  Surgeon: Sanda Klein, MD;  Location: [facility];  Service: Cardiovascular;  Laterality: N/A;  . NECK SURGERY      Current Medications: Current Meds  Medication Sig  . acetaminophen (TYLENOL) 500 MG tablet Take 1,000 mg by mouth every 6 (six) hours as needed for moderate pain or headache.  . albuterol (PROVENTIL HFA;VENTOLIN HFA) 108 (90 Base) MCG/ACT inhaler Inhale 1-2 puffs into the lungs every 4 (four) hours as needed for wheezing or shortness of breath.   [name] amiodarone (PACERONE) 200 MG tablet Take 1 tablet (200 mg total) by mouth daily.  [name] apixaban (ELIQUIS) 5 MG TABS tablet Take 5 mg by mouth 2 (two) times daily.   [name] atorvastatin (LIPITOR) 40 MG tablet Take 40 mg by mouth daily.  [name] CINNAMON PO Take 700 mg by mouth daily.   . Cyanocobalamin (B-12) 5000 MCG CAPS Take 5,000 mcg by mouth daily.  . Fluticasone-Umeclidin-Vilant (TRELEGY ELLIPTA) 100-62.5-25 MCG/INH AEPB Inhale 1 puff into the lungs daily.  . folic acid (FOLVITE) 962 MCG tablet Take 800 mcg by mouth daily.  [name] glipiZIDE (GLUCOTROL) 10 MG tablet Take 10 mg by mouth See admin instructions. Take 10 mg in the morning, may take a second 10 mg dose as needed for blood sugar over 190  . HYDROcodone-acetaminophen (NORCO) 10-325 MG tablet Take 1 tablet by mouth every 4 (four) hours as needed for moderate pain.   . hydrocortisone cream 1 % Apply 1 application topically daily as needed for itching.  . Insulin Glargine-Lixisenatide (SOLIQUA) 100-33 UNT-MCG/ML SOPN Inject 30 Units into the skin daily.  [name] ipratropium-albuterol (DUONEB) 0.5-2.5 (3) MG/3ML SOLN Take 3 mLs by nebulization every 6 (six) hours as needed (shortness of breath).   [name] levocetirizine (XYZAL) 5 MG tablet Take 5 mg by mouth daily as needed for allergies.  . magnesium chloride (SLOW-MAG) 64 MG TBEC SR tablet Take 1 tablet (64 mg total) by mouth 2  (two) times daily.  . metoprolol tartrate (LOPRESSOR) 50 MG tablet TAKE 1 TABLET BY MOUTH EVERY 6 HOURS  . montelukast (SINGULAIR) 10 MG tablet Take 10 mg by mouth at bedtime.  . Omega-3 Fatty Acids (OMEGA-3 FISH OIL CONCENTRATE) 1000 MG CPDR Take 2,000 mg by mouth 2 (two) times a day.  [name] omeprazole (PRILOSEC) 20 MG capsule Take 20 mg by mouth 2 (two) times daily before a meal.   . potassium chloride SA (K-DUR) 20 MEQ tablet Take 20 mEq by mouth 2 (two) times daily.  [name] pyridOXINE (VITAMIN B-6) 100 MG tablet Take 100 mg by mouth daily.  . sertraline (ZOLOFT) 50 MG tablet Take 50 mg by mouth 2 (two) times daily.  . tamsulosin (FLOMAX) 0.4 MG CAPS capsule Take 0.4 mg by mouth daily.  [name] torsemide (DEMADEX) 20 MG tablet Take 1 tablet (20 mg total) by mouth 2 (two) times daily. Take an extra tablet (20 mg) in the afternoon on Monday, Wednesday, Friday.     Allergies:   Patient has no known allergies.   Social History   Socioeconomic  History  . Marital status: Married    Spouse name: Not on file  . Number of children: Not on file  . Years of education: Not on file  . Highest education level: Not on file  Occupational History  . Not on file  Social Needs  . Financial resource strain: Not on file  . Food insecurity    Worry: Not on file    Inability: Not on file  . Transportation needs    Medical: Not on file    Non-medical: Not on file  Tobacco Use  . Smoking status: Former Research scientist (life sciences)  . Smokeless tobacco: Never Used  Substance and Sexual Activity  . Alcohol use: Not Currently    Frequency: Never  . Drug use: Not Currently    Types: Hydrocodone  . Sexual activity: Not on file  Lifestyle  . Physical activity    Days per week: Not on file    Minutes per session: Not on file  . Stress: Not on file  Relationships  . Social Herbalist on phone: Not on file    Gets together: Not on file    Attends religious service: Not on file    Active member of club or organization: Not  on file    Attends meetings of clubs or organizations: Not on file    Relationship status: Not on file  Other Topics Concern  . Not on file  Social History Narrative  . Not on file     Family History: The patient's family history includes Atrial fibrillation in his mother; Cancer in his mother; Heart disease in his father.   ROS:   Please see the history of present illness.    Review of Systems  Constitution: Negative for chills, fever and malaise/fatigue.  Cardiovascular: Positive for leg swelling ("sometimes"). Negative for chest pain, dyspnea on exertion, irregular heartbeat, palpitations and syncope.  Respiratory: Negative for cough, shortness of breath and wheezing.   Gastrointestinal: Negative for melena, nausea and vomiting.  Neurological: Negative for dizziness and light-headedness.    All other systems reviewed and are negative.  EKGs/Labs/Other Studies Reviewed:    The following studies were reviewed today: Echo 08/11/18 Left ventricle: The cavity size was normal. There was mild   concentric hypertrophy. Systolic function was normal. The   estimated ejection fraction was in the range of 55% to 60%. Wall   motion was normal; there were no regional wall motion   abnormalities. The study is not technically sufficient to allow   evaluation of LV diastolic function. - Aortic valve: There was mild stenosis. Valve area (VTI): 3.3   cm^2. Valve area (Vmax): 2.87 cm^2. Valve area (Vmean): 2.64   cm^2. - Mitral valve: There was mild regurgitation. - Left atrium: The atrium was moderately dilated. - Right atrium: The atrium was mildly to moderately dilated. - Ascending aortae mildly dilated.  Monitor 07/27/18 A Holter monitor was applied for 43 hours 31 minutes on 07/27/2018 to assess heart rate response atrial fibrillation. The rhythm throughout his atrial fibrillation.  Minimum average and maximum heart rates are 63 106 171 bpm. Occasional PVCs were present 575 with 2  couplets. Longest R-R interval is 1.6 seconds.  No significant bradycardia seen QT interval average is normal 374 ms. There were no symptomatic events Conclusion, atrial fibrillation with a rapid ventricular rate   EKG:  EKG ordered today and personally reviewed.  The ekg ordered today demonstrates normal sinus rhythm with RBBB.  Recent Labs:  05/18/19  via [facility]: creatinine 1.47, GFR 46, Ferritin 22, Hb 13.6, vit D 17.5  05/03/19 via [facility]:ALT 33, AST 33, K 4.1, A1c 7.6  04/26/19 via [facility]: TSH 4.24  Recent Lipid Panel 04/26/19 via [facility]: Total 134, HDL 36, LDL 65, Triglycerides 163  Physical Exam:    VS:  BP (!) 108/58   Pulse 82   Temp 99.9 F (37.7 C)   Ht 6' (1.829 m)   Wt (!) 341 lb 9.6 oz (154.9 kg)   BMI 46.33 kg/m     Wt Readings from Last 3 Encounters:  06/14/19 (!) 341 lb 9.6 oz (154.9 kg)  03/24/19 (!) 327 lb (148.3 kg)  12/22/18 (!) 325 lb (147.4 kg)     GEN:  Well nourished, overweight, well developed in no acute distress HEENT: Normal NECK: No JVD; No carotid bruits LYMPHATICS: No lymphadenopathy CARDIAC: RRR, no murmurs, rubs, gallops RESPIRATORY:  Clear to auscultation without rales, wheezing or rhonchi  ABDOMEN: Soft, obese, non-tender, non-distended MUSCULOSKELETAL:  No edema; No deformity  SKIN: Warm and dry NEUROLOGIC:  Alert and oriented x 3 PSYCHIATRIC:  Normal affect    Signed, Shirlee More, MD  06/14/2019 3:07 PM    [facility]

## 2019-06-14 ENCOUNTER — Encounter: Payer: Self-pay | Admitting: Cardiology

## 2019-06-14 ENCOUNTER — Other Ambulatory Visit: Payer: Self-pay

## 2019-06-14 ENCOUNTER — Ambulatory Visit (INDEPENDENT_AMBULATORY_CARE_PROVIDER_SITE_OTHER): Payer: Medicare HMO | Admitting: Cardiology

## 2019-06-14 VITALS — BP 108/58 | HR 82 | Temp 99.9°F | Ht 72.0 in | Wt 341.6 lb

## 2019-06-14 DIAGNOSIS — N183 Chronic kidney disease, stage 3 unspecified: Secondary | ICD-10-CM

## 2019-06-14 DIAGNOSIS — I480 Paroxysmal atrial fibrillation: Secondary | ICD-10-CM | POA: Diagnosis not present

## 2019-06-14 DIAGNOSIS — I4819 Other persistent atrial fibrillation: Secondary | ICD-10-CM | POA: Insufficient documentation

## 2019-06-14 DIAGNOSIS — I5032 Chronic diastolic (congestive) heart failure: Secondary | ICD-10-CM | POA: Diagnosis not present

## 2019-06-14 DIAGNOSIS — G4733 Obstructive sleep apnea (adult) (pediatric): Secondary | ICD-10-CM | POA: Diagnosis not present

## 2019-06-14 DIAGNOSIS — Z7901 Long term (current) use of anticoagulants: Secondary | ICD-10-CM | POA: Diagnosis not present

## 2019-06-14 DIAGNOSIS — E785 Hyperlipidemia, unspecified: Secondary | ICD-10-CM

## 2019-06-14 DIAGNOSIS — Z79899 Other long term (current) drug therapy: Secondary | ICD-10-CM | POA: Diagnosis not present

## 2019-06-14 DIAGNOSIS — N1832 Chronic kidney disease, stage 3b: Secondary | ICD-10-CM | POA: Insufficient documentation

## 2019-06-14 DIAGNOSIS — I5041 Acute combined systolic (congestive) and diastolic (congestive) heart failure: Secondary | ICD-10-CM

## 2019-06-14 DIAGNOSIS — I130 Hypertensive heart and chronic kidney disease with heart failure and stage 1 through stage 4 chronic kidney disease, or unspecified chronic kidney disease: Secondary | ICD-10-CM

## 2019-06-14 HISTORY — DX: Acute combined systolic (congestive) and diastolic (congestive) heart failure: I50.41

## 2019-06-14 HISTORY — DX: Hypertensive heart and chronic kidney disease with heart failure and stage 1 through stage 4 chronic kidney disease, or unspecified chronic kidney disease: I13.0

## 2019-06-14 NOTE — Patient Instructions (Signed)
Medication Instructions:  Your physician recommends that you continue on your current medications as directed. Please refer to the Current Medication list given to you today.  If you need a refill on your cardiac medications before your next appointment, please call your pharmacy.   Lab work: Your physician recommends that you return for lab work in: TODAY Pro BNP  If you have labs (blood work) drawn today and your tests are completely normal, you will receive your results only by: [name] MyChart Message (if you have MyChart) OR . A paper copy in the mail If you have any lab test that is abnormal or we need to change your treatment, we will call you to review the results.  Testing/Procedures: NOne  Follow-Up: At [facility], you and your health needs are our priority.  As part of our continuing mission to provide you with exceptional heart care, we have created designated Provider Care Teams.  These Care Teams include your primary Cardiologist (physician) and Advanced Practice Providers (APPs -  Physician Assistants and Nurse Practitioners) who all work together to provide you with the care you need, when you need it. You will need a follow up appointment in 3 months.   Any Other Special Instructions Will Be Listed Below (If Applicable).   Daily weights and record

## 2019-06-15 LAB — PRO B NATRIURETIC PEPTIDE: NT-Pro BNP: 112 pg/mL (ref 0–486)

## 2019-06-27 ENCOUNTER — Other Ambulatory Visit: Payer: Self-pay | Admitting: Cardiology

## 2019-07-10 DIAGNOSIS — L0201 Cutaneous abscess of face: Secondary | ICD-10-CM | POA: Diagnosis not present

## 2019-07-11 DIAGNOSIS — G894 Chronic pain syndrome: Secondary | ICD-10-CM | POA: Diagnosis not present

## 2019-07-11 DIAGNOSIS — M961 Postlaminectomy syndrome, not elsewhere classified: Secondary | ICD-10-CM | POA: Diagnosis not present

## 2019-07-14 DIAGNOSIS — N183 Chronic kidney disease, stage 3 (moderate): Secondary | ICD-10-CM | POA: Diagnosis not present

## 2019-08-16 DIAGNOSIS — E1142 Type 2 diabetes mellitus with diabetic polyneuropathy: Secondary | ICD-10-CM | POA: Diagnosis not present

## 2019-08-16 DIAGNOSIS — Z23 Encounter for immunization: Secondary | ICD-10-CM | POA: Diagnosis not present

## 2019-08-16 DIAGNOSIS — R69 Illness, unspecified: Secondary | ICD-10-CM | POA: Diagnosis not present

## 2019-08-16 DIAGNOSIS — Z6841 Body Mass Index (BMI) 40.0 and over, adult: Secondary | ICD-10-CM | POA: Diagnosis not present

## 2019-08-16 DIAGNOSIS — J449 Chronic obstructive pulmonary disease, unspecified: Secondary | ICD-10-CM | POA: Diagnosis not present

## 2019-08-16 DIAGNOSIS — E782 Mixed hyperlipidemia: Secondary | ICD-10-CM | POA: Diagnosis not present

## 2019-08-16 DIAGNOSIS — I4811 Longstanding persistent atrial fibrillation: Secondary | ICD-10-CM | POA: Diagnosis not present

## 2019-08-17 DIAGNOSIS — N183 Chronic kidney disease, stage 3 (moderate): Secondary | ICD-10-CM | POA: Diagnosis not present

## 2019-08-17 DIAGNOSIS — I129 Hypertensive chronic kidney disease with stage 1 through stage 4 chronic kidney disease, or unspecified chronic kidney disease: Secondary | ICD-10-CM | POA: Diagnosis not present

## 2019-08-17 DIAGNOSIS — D631 Anemia in chronic kidney disease: Secondary | ICD-10-CM | POA: Diagnosis not present

## 2019-08-17 DIAGNOSIS — E876 Hypokalemia: Secondary | ICD-10-CM | POA: Diagnosis not present

## 2019-08-17 DIAGNOSIS — R600 Localized edema: Secondary | ICD-10-CM | POA: Diagnosis not present

## 2019-08-17 DIAGNOSIS — N2581 Secondary hyperparathyroidism of renal origin: Secondary | ICD-10-CM | POA: Diagnosis not present

## 2019-08-17 DIAGNOSIS — E669 Obesity, unspecified: Secondary | ICD-10-CM | POA: Diagnosis not present

## 2019-09-06 ENCOUNTER — Other Ambulatory Visit: Payer: Self-pay

## 2019-09-06 MED ORDER — METOPROLOL TARTRATE 50 MG PO TABS: 50.0000 mg | ORAL_TABLET | Freq: Four times a day (QID) | ORAL | 0 refills | Status: DC

## 2019-09-14 ENCOUNTER — Ambulatory Visit: Payer: Medicare HMO | Admitting: Cardiology

## 2019-09-21 ENCOUNTER — Other Ambulatory Visit: Payer: Self-pay | Admitting: Cardiology

## 2019-10-18 ENCOUNTER — Ambulatory Visit: Payer: Medicare HMO | Admitting: Cardiology

## 2019-11-29 ENCOUNTER — Telehealth: Payer: Self-pay | Admitting: Cardiology

## 2019-11-29 NOTE — Telephone Encounter (Signed)
Called patient informed him per Dr. Bettina Gavia patient's daughter can attend visit with him. Appointment note document stating this as well.

## 2019-11-29 NOTE — Telephone Encounter (Signed)
If necessary yes

## 2019-11-29 NOTE — Telephone Encounter (Signed)
New Message  Patient's daughter [name]) is calling in to get approval to attend patient's appointment with him on 12/04/2019 at 2:55pm. States that patient walks with a cain and needs help walking and understanding the doctor. Please give patient's daughter Jenny Reichmann a call back to confirm her attendance to the appointment.  Callback # [phone]

## 2019-12-02 NOTE — Progress Notes (Signed)
Cardiology Office Note:    Date:  12/04/2019   ID:  Shawn Sullivan, DOB Apr 21, 1946, MRN [id number]  PCP:  Shawn Brome, MD  Cardiologist:  Shawn More, MD    Referring MD: Shawn Brome, MD    ASSESSMENT:    1. PAF (paroxysmal atrial fibrillation) ([place])   2. On amiodarone therapy   3. Chronic anticoagulation   4. Hypertensive heart and kidney disease with chronic diastolic congestive heart failure and stage 3 chronic kidney disease, unspecified whether stage 3a or 3b CKD ([place])   5. Chronic obstructive pulmonary disease, unspecified COPD type ([place])   6. Nonrheumatic aortic valve stenosis    PLAN:    In order of problems listed above:  1. He has done well after cardioversion maintaining sinus rhythm on low-dose amiodarone and resolution of signs and symptoms of heart failure.  He will continue his anticoagulant low-dose amiodarone and recent labs in his PCP office showed normal liver function.  We discussed doing a chest x-ray because of the prevalence of COVID-19 will delay until after next visit and after he receives vaccine. 2. Continue low-dose amiodarone his PCP checks labs every 6 months and remind him to do a TSH with his next labs along with T3-T4 and a CMP for hepatic and thyroid toxicity 3. Continue anticoagulation moderate stroke risk 4. Stable BP at target heart failure is compensated New York Heart Association class I and stable CKD followed by his PCP 5. Stable COPD continue current treatment bronchodilators 6. Mild aortic stenosis consider repeat echocardiogram after his next visit 7. Start to wear medical eye protection with high prevalence of COVID-19 in our community   Next appointment: 6 months   Medication Adjustments/Labs and Tests Ordered: Current medicines are reviewed at length with the patient today.  Concerns regarding medicines are outlined above.  No orders of the defined types were placed in this encounter.  No orders of the defined types were  placed in this encounter.   Chief Complaint  Patient presents with  . Follow-up  . Atrial Fibrillation  . Anticoagulation  . Congestive Heart Failure  . Hypertension  . Chronic Kidney Disease    History of Present Illness:    Shawn Sullivan is a 74 y.o. male with a hx of  persistent atrial fibrillation, hypertension, chronic kidney disease 3, COPD, and obstructive sleep apnea last seen 06/14/2019.  He has been on amiodarone since January 2020 and was cardioverted to sinus rhythm 12/01/2018.    Compliance with diet, lifestyle and medications: Yes  Echo 08/11/18 Left ventricle: The cavity size was normal. There was mild concentric hypertrophy. Systolic function was normal. The estimated ejection fraction was in the range of 55% to 60%. Wall motion was normal; there were no regional wall motion abnormalities. The study is not technically sufficient to allow evaluation of LV diastolic function. - Aortic valve: There was mild stenosis. Valve area (VTI): 3.3 cm^2. Valve area (Vmax): 2.87 cm^2. Valve area (Vmean): 2.64 cm^2. - Mitral valve: There was mild regurgitation. - Left atrium: The atrium was moderately dilated. - Right atrium: The atrium was mildly to moderately dilated. - Ascending aortae mildly dilated.  Monitor 07/27/18 A Holter monitor was applied for 43 hours 31 minutes on 07/27/2018 to assess heart rate response atrial fibrillation. The rhythm throughout was atrial fibrillation. Minimum average and maximum heart rates are 63 106 171 bpm. Occasional PVCs were present 575 with 2 couplets. Longest R-R interval is 1.6 seconds. No significant bradycardia seen QT interval  average is normal 374 ms. There were no symptomatic events Conclusion, atrial fibrillation with a rapid ventricular rate  EKG 06/14/2019 personally reviewed sinus rhythm right bundle branch block on amiodarone  Overall he is done well he has good healthcare literacy and practices all  protective measures for COVID-19 will start to wear medical eye protection I encouraged him and he agrees to accept vaccine.  He has had no edema shortness of breath chest pain palpitation or syncope.  He tolerates his anticoagulant without bleeding complication and tolerates lipid-lowering therapy without muscle symptoms his recent lipid profile performed at his PCP office Past Medical History:  Diagnosis Date  . Anticoagulated 07/01/2018  . Anticoagulated 07/01/2018  . Benign essential hypertension 05/06/2013  . Chronic obstructive pulmonary disease ([place]) 11/05/2015  . CKD (chronic kidney disease) stage 2, GFR 60-89 ml/min 07/01/2018  . COPD (chronic obstructive pulmonary disease) ([place]) 07/01/2018  . Dermatochalasis 12/16/2015  . Disorder of bursae and tendons in shoulder region 01/05/2014  . Generalized edema 06/22/2016  . Hyperlipidemia 12/16/2015  . Hypertensive heart disease 07/01/2018  . Low back pain 08/17/2013  . Lumbosacral spondylosis 05/06/2013  . Obstructive sleep apnea 07/01/2018  . Persistent atrial fibrillation ([place]) 07/01/2018  . Piriformis syndrome 05/03/2014  . Postlaminectomy syndrome, lumbar region 05/06/2013  . Presbyopia of both eyes 12/16/2015  . Restless legs syndrome 03/23/2016  . Restrictive lung disease 06/22/2016  . Sleep apnea 05/06/2013  . Type 2 diabetes mellitus without complications ([place]) [id number]    Past Surgical History:  Procedure Laterality Date  . APPENDECTOMY    . BACK SURGERY    . CARDIOVERSION N/A 12/01/2018   Procedure: CARDIOVERSION;  Surgeon: Sanda Klein, MD;  Location: [facility];  Service: Cardiovascular;  Laterality: N/A;  . NECK SURGERY      Current Medications: Current Meds  Medication Sig  . acetaminophen (TYLENOL) 500 MG tablet Take 1,000 mg by mouth every 6 (six) hours as needed for moderate pain or headache.  . albuterol (PROVENTIL HFA;VENTOLIN HFA) 108 (90 Base) MCG/ACT inhaler Inhale 1-2 puffs into the lungs every 4 (four) hours as needed for  wheezing or shortness of breath.   [name] amiodarone (PACERONE) 200 MG tablet Take 200 mg by mouth daily.  [name] apixaban (ELIQUIS) 5 MG TABS tablet Take 5 mg by mouth 2 (two) times daily.   . cholecalciferol (VITAMIN D3) 25 MCG (1000 UT) tablet Take 1,000 Units by mouth daily.  [name] CINNAMON PO Take 700 mg by mouth daily.   . Cyanocobalamin (B-12) 5000 MCG CAPS Take 5,000 mcg by mouth daily.  . ferrous sulfate 324 MG TBEC Take 324 mg by mouth daily with breakfast.  . Fluticasone-Umeclidin-Vilant (TRELEGY ELLIPTA) 100-62.5-25 MCG/INH AEPB Inhale 1 puff into the lungs daily.  . folic acid (FOLVITE) Q000111Q MCG tablet Take 800 mcg by mouth daily.  [name] glipiZIDE (GLUCOTROL) 10 MG tablet Take 10 mg by mouth See admin instructions. Take 10 mg in the morning, may take a second 10 mg dose as needed for blood sugar over 190  . HYDROcodone-acetaminophen (NORCO) 10-325 MG tablet Take 1 tablet by mouth every 4 (four) hours as needed for moderate pain.   . hydrocortisone cream 1 % Apply 1 application topically daily as needed for itching.  . Insulin Glargine-Lixisenatide (SOLIQUA) 100-33 UNT-MCG/ML SOPN Inject 30 Units into the skin daily.  . Insulin Glargine-Lixisenatide (SOLIQUA) 100-33 UNT-MCG/ML SOPN Inject into the skin.  [name] ipratropium-albuterol (DUONEB) 0.5-2.5 (3) MG/3ML SOLN Take 3 mLs by nebulization every 6 (six) hours as  needed (shortness of breath).   [name] levocetirizine (XYZAL) 5 MG tablet Take 5 mg by mouth daily as needed for allergies.  . magnesium chloride (SLOW-MAG) 64 MG TBEC SR tablet Take 1 tablet (64 mg total) by mouth 2 (two) times daily.  . metolazone (ZAROXOLYN) 2.5 MG tablet Take 2.5 mg by mouth 2 (two) times a week.  . metoprolol tartrate (LOPRESSOR) 50 MG tablet Take 1 tablet (50 mg total) by mouth every 6 (six) hours. (Patient taking differently: Take 50 mg by mouth 3 (three) times daily. )  . montelukast (SINGULAIR) 10 MG tablet Take 10 mg by mouth at bedtime.  . Omega-3 Fatty Acids (OMEGA-3 FISH  OIL CONCENTRATE) 1000 MG CPDR Take 2,000 mg by mouth 2 (two) times a day.  [name] omeprazole (PRILOSEC) 20 MG capsule Take 20 mg by mouth 2 (two) times daily before a meal.   . potassium chloride SA (K-DUR) 20 MEQ tablet Take 20 mEq by mouth 2 (two) times daily.  [name] pyridOXINE (VITAMIN B-6) 100 MG tablet Take 100 mg by mouth daily.  . rosuvastatin (CRESTOR) 20 MG tablet 20 mg daily.  . sertraline (ZOLOFT) 25 MG tablet Take 25 mg by mouth 2 (two) times daily.  . sertraline (ZOLOFT) 50 MG tablet Take 50 mg by mouth 2 (two) times daily.  . tamsulosin (FLOMAX) 0.4 MG CAPS capsule Take 0.4 mg by mouth daily.  [name] torsemide (DEMADEX) 20 MG tablet Take 1 tablet (20 mg total) by mouth 2 (two) times daily. Take an extra tablet (20 mg) in the afternoon on Monday, Wednesday, Friday.  . [DISCONTINUED] metolazone (ZAROXOLYN) 5 MG tablet 5 mg once a week.     Allergies:   Patient has no known allergies.   Social History   Socioeconomic History  . Marital status: Married    Spouse name: Not on file  . Number of children: Not on file  . Years of education: Not on file  . Highest education level: Not on file  Occupational History  . Not on file  Tobacco Use  . Smoking status: Former Research scientist (life sciences)  . Smokeless tobacco: Never Used  Substance and Sexual Activity  . Alcohol use: Not Currently  . Drug use: Not Currently    Types: Hydrocodone  . Sexual activity: Not on file  Other Topics Concern  . Not on file  Social History Narrative  . Not on file   Social Determinants of Health   Financial Resource Strain:   . Difficulty of Paying Living Expenses: Not on file  Food Insecurity:   . Worried About Charity fundraiser in the Last Year: Not on file  . Ran Out of Food in the Last Year: Not on file  Transportation Needs:   . Lack of Transportation (Medical): Not on file  . Lack of Transportation (Non-Medical): Not on file  Physical Activity:   . Days of Exercise per Week: Not on file  . Minutes of Exercise  per Session: Not on file  Stress:   . Feeling of Stress : Not on file  Social Connections:   . Frequency of Communication with Friends and Family: Not on file  . Frequency of Social Gatherings with Friends and Family: Not on file  . Attends Religious Services: Not on file  . Active Member of Clubs or Organizations: Not on file  . Attends Archivist Meetings: Not on file  . Marital Status: Not on file     Family History: The patient's family history includes  Atrial fibrillation in his mother; Cancer in his mother; Heart disease in his father. ROS:   Please see the history of present illness.    All other systems reviewed and are negative.  EKGs/Labs/Other Studies Reviewed:    The following studies were reviewed today:  EKG:  EKG ordered today and personally reviewed.  The ekg ordered today demonstrates sinus rhythm right bundle branch block first-degree AV block left axis deviation.  Recent Labs: 06/14/2019: NT-Pro BNP 112  Recent Lipid Panel No results found for: CHOL, TRIG, HDL, CHOLHDL, VLDL, LDLCALC, LDLDIRECT  Physical Exam:    VS:  BP 112/68 (BP Location: Right Arm, Patient Position: Sitting, Cuff Size: Large)   Pulse 75   Ht 6' (1.829 m)   Wt (!) 337 lb (152.9 kg)   SpO2 90%   BMI 45.71 kg/m     Wt Readings from Last 3 Encounters:  12/04/19 (!) 337 lb (152.9 kg)  06/14/19 (!) 341 lb 9.6 oz (154.9 kg)  03/24/19 (!) 327 lb (148.3 kg)     GEN:  Well nourished, well developed in no acute distress HEENT: Normal NECK: No JVD; No carotid bruits LYMPHATICS: No lymphadenopathy CARDIAC: RRR, no murmurs, rubs, gallops RESPIRATORY:  Clear to auscultation without rales, wheezing or rhonchi  ABDOMEN: Soft, non-tender, non-distended MUSCULOSKELETAL:  No edema; No deformity  SKIN: Warm and dry NEUROLOGIC:  Alert and oriented x 3 PSYCHIATRIC:  Normal affect    Signed, Shawn More, MD  12/04/2019 3:33 PM    [facility]

## 2019-12-04 ENCOUNTER — Ambulatory Visit (INDEPENDENT_AMBULATORY_CARE_PROVIDER_SITE_OTHER): Payer: Medicare HMO | Admitting: Cardiology

## 2019-12-04 ENCOUNTER — Encounter: Payer: Self-pay | Admitting: Cardiology

## 2019-12-04 ENCOUNTER — Other Ambulatory Visit: Payer: Self-pay

## 2019-12-04 VITALS — BP 112/68 | HR 75 | Ht 72.0 in | Wt 337.0 lb

## 2019-12-04 DIAGNOSIS — Z7901 Long term (current) use of anticoagulants: Secondary | ICD-10-CM

## 2019-12-04 DIAGNOSIS — I480 Paroxysmal atrial fibrillation: Secondary | ICD-10-CM | POA: Diagnosis not present

## 2019-12-04 DIAGNOSIS — I130 Hypertensive heart and chronic kidney disease with heart failure and stage 1 through stage 4 chronic kidney disease, or unspecified chronic kidney disease: Secondary | ICD-10-CM

## 2019-12-04 DIAGNOSIS — Z79899 Other long term (current) drug therapy: Secondary | ICD-10-CM

## 2019-12-04 DIAGNOSIS — I350 Nonrheumatic aortic (valve) stenosis: Secondary | ICD-10-CM

## 2019-12-04 DIAGNOSIS — I5032 Chronic diastolic (congestive) heart failure: Secondary | ICD-10-CM

## 2019-12-04 DIAGNOSIS — N183 Chronic kidney disease, stage 3 unspecified: Secondary | ICD-10-CM

## 2019-12-04 DIAGNOSIS — J449 Chronic obstructive pulmonary disease, unspecified: Secondary | ICD-10-CM

## 2019-12-04 NOTE — Patient Instructions (Addendum)
Medication Instructions:  Your physician recommends that you continue on your current medications as directed. Please refer to the Current Medication list given to you today.  *If you need a refill on your cardiac medications before your next appointment, please call your pharmacy*  Lab Work: None.  If you have labs (blood work) drawn today and your tests are completely normal, you will receive your results only by: [name] MyChart Message (if you have MyChart) OR . A paper copy in the mail If you have any lab test that is abnormal or we need to change your treatment, we will call you to review the results.  Testing/Procedures: None.   Follow-Up: At [facility], you and your health needs are our priority.  As part of our continuing mission to provide you with exceptional heart care, we have created designated Provider Care Teams.  These Care Teams include your primary Cardiologist (physician) and Advanced Practice Providers (APPs -  Physician Assistants and Nurse Practitioners) who all work together to provide you with the care you need, when you need it.  Your next appointment:   6 month(s)  The format for your next appointment:   In Person  Provider:   Shirlee More, MD  Other Instructions    Purchase on line at [facility] or at [facility]

## 2020-01-01 ENCOUNTER — Other Ambulatory Visit: Payer: Self-pay | Admitting: Family Medicine

## 2020-01-02 ENCOUNTER — Other Ambulatory Visit: Payer: Self-pay

## 2020-01-02 MED ORDER — METOPROLOL TARTRATE 50 MG PO TABS: 50.0000 mg | ORAL_TABLET | Freq: Four times a day (QID) | ORAL | 2 refills | Status: DC

## 2020-01-06 ENCOUNTER — Other Ambulatory Visit: Payer: Self-pay | Admitting: Family Medicine

## 2020-01-16 ENCOUNTER — Other Ambulatory Visit: Payer: Self-pay | Admitting: Family Medicine

## 2020-01-18 ENCOUNTER — Telehealth: Payer: Self-pay

## 2020-01-18 NOTE — Telephone Encounter (Signed)
Patient called requesting samples of Eliquis 5 mg. Patient aware that samples are here for him to pick-up.

## 2020-01-31 ENCOUNTER — Ambulatory Visit: Payer: Medicare HMO | Admitting: Family Medicine

## 2020-02-12 ENCOUNTER — Other Ambulatory Visit: Payer: Self-pay | Admitting: Family Medicine

## 2020-02-15 ENCOUNTER — Other Ambulatory Visit: Payer: Self-pay

## 2020-02-15 NOTE — Telephone Encounter (Signed)
Wife called stating patient needed some samples of Eliquis, samples are ready and patient is aware.

## 2020-02-23 ENCOUNTER — Ambulatory Visit: Payer: Medicare HMO | Admitting: Family Medicine

## 2020-02-23 ENCOUNTER — Encounter: Payer: Self-pay | Admitting: Physician Assistant

## 2020-02-23 ENCOUNTER — Other Ambulatory Visit: Payer: Self-pay

## 2020-02-23 ENCOUNTER — Ambulatory Visit (INDEPENDENT_AMBULATORY_CARE_PROVIDER_SITE_OTHER): Payer: Medicare HMO | Admitting: Physician Assistant

## 2020-02-23 VITALS — BP 134/70 | HR 78 | Temp 97.7°F | Ht 71.0 in | Wt 346.0 lb

## 2020-02-23 DIAGNOSIS — E1142 Type 2 diabetes mellitus with diabetic polyneuropathy: Secondary | ICD-10-CM | POA: Insufficient documentation

## 2020-02-23 DIAGNOSIS — I482 Chronic atrial fibrillation, unspecified: Secondary | ICD-10-CM | POA: Diagnosis not present

## 2020-02-23 DIAGNOSIS — E0842 Diabetes mellitus due to underlying condition with diabetic polyneuropathy: Secondary | ICD-10-CM | POA: Diagnosis not present

## 2020-02-23 DIAGNOSIS — K219 Gastro-esophageal reflux disease without esophagitis: Secondary | ICD-10-CM | POA: Insufficient documentation

## 2020-02-23 DIAGNOSIS — J449 Chronic obstructive pulmonary disease, unspecified: Secondary | ICD-10-CM

## 2020-02-23 DIAGNOSIS — Z794 Long term (current) use of insulin: Secondary | ICD-10-CM

## 2020-02-23 DIAGNOSIS — E782 Mixed hyperlipidemia: Secondary | ICD-10-CM

## 2020-02-23 HISTORY — DX: Type 2 diabetes mellitus with diabetic polyneuropathy: E11.42

## 2020-02-23 HISTORY — DX: Gastro-esophageal reflux disease without esophagitis: K21.9

## 2020-02-23 NOTE — Assessment & Plan Note (Signed)
Continue current meds as directed 

## 2020-02-23 NOTE — Assessment & Plan Note (Signed)
Well controlled.  ?No changes to medicines.  ?Continue to work on eating a healthy diet and exercise.  ?Labs drawn today.  ?

## 2020-02-23 NOTE — Assessment & Plan Note (Signed)
Continue meds as directed

## 2020-02-23 NOTE — Progress Notes (Signed)
Established Patient Office Visit  Subjective:  Patient ID: Shawn Sullivan, male    DOB: 10/21/1946  Age: 74 y.o. MRN: [id number]  CC:  Chief Complaint  Patient presents with  . COPD  . Diabetes  . Hyperlipidemia    HPI Shawn Sullivan presents for follow up diabetes  Shawn Sullivan presents with type 2 diabetes mellitus with diabetic polyneuropathy.  Specifically, this is type 2, insulin requiring diabetes, complicated by nephropathy and peripheral neuropathy.  Compliance with treatment has been good; he takes his medication as directed, follows up as directed, and is keeping a glucose diary.  FBS: 90-110 Current meds include an oral hypoglycemic ( Glipizide ( 10 mg one twice a day ) ) and insulin/injectable ( soliqua 40 U daily. ).  The hypoglycemic episodes are not present.[name] He checks his glucose 2 times per day.  In regard to preventative care, he performs foot self-exams daily and his last ophthalmology exam was in 12/2017.      Pt presents with hyperlipidemia.  Current treatment includes Crestor and a low cholesterol/low fat diet.  Compliance with treatment has been good; he takes his medication as directed and follows up as directed.      Dx with chronic obstructive pulmonary disease, unspecified; the duration of COPD has been several years.  Patient has an established diagnosis of COPD.  medicaions include trelegy and ventolin.  using duoneb/ventolin - rescue. States with recent weather changes he has used his meds consistently    In regard to the other persistent atrial fibrillation, he is here for routine follow-up for atrial fibrillation. Current related medications include amiodarone,  metoprolol for rate control, and Eliquis.   He is extremely compliant with his medication regimen. [facility] regularly     Shawn Sullivan presents with a diagnosis of gastro-esophageal reflux disease without esophagitis.  Taking omeprazole. ROS:   Past Medical History:  Diagnosis Date  .  Anticoagulated 07/01/2018  . Anticoagulated 07/01/2018  . Benign essential hypertension 05/06/2013  . Chronic obstructive pulmonary disease ([place]) 11/05/2015  . CKD (chronic kidney disease) stage 2, GFR 60-89 ml/min 07/01/2018  . COPD (chronic obstructive pulmonary disease) ([place]) 07/01/2018  . Dermatochalasis 12/16/2015  . Disorder of bursae and tendons in shoulder region 01/05/2014  . Generalized edema 06/22/2016  . Hyperlipidemia 12/16/2015  . Hypertensive heart disease 07/01/2018  . Low back pain 08/17/2013  . Lumbosacral spondylosis 05/06/2013  . Obstructive sleep apnea 07/01/2018  . Persistent atrial fibrillation ([place]) 07/01/2018  . Piriformis syndrome 05/03/2014  . Postlaminectomy syndrome, lumbar region 05/06/2013  . Presbyopia of both eyes 12/16/2015  . Restless legs syndrome 03/23/2016  . Restrictive lung disease 06/22/2016  . Sleep apnea 05/06/2013  . Type 2 diabetes mellitus without complications ([place]) 99991111    Past Surgical History:  Procedure Laterality Date  . APPENDECTOMY    . BACK SURGERY    . CARDIOVERSION N/A 12/01/2018   Procedure: CARDIOVERSION;  Surgeon: Sanda Klein, MD;  Location: [facility];  Service: Cardiovascular;  Laterality: N/A;  . CATARACT EXTRACTION Bilateral   . LUMBAR FUSION    . NECK SURGERY      Family History  Problem Relation Age of Onset  . Atrial fibrillation Mother   . Cancer Mother   . Heart disease Father     Social History   Socioeconomic History  . Marital status: Married    Spouse name: Not on file  . Number of children: Not on file  . Years of education: Not on file  .  Highest education level: Not on file  Occupational History  . Not on file  Tobacco Use  . Smoking status: Former Smoker    Quit date: 1993    Years since quitting: 28.2  . Smokeless tobacco: Never Used  Substance and Sexual Activity  . Alcohol use: Not Currently  . Drug use: Not Currently    Types: Hydrocodone  . Sexual activity: Not on file  Other Topics Concern  . Not  on file  Social History Narrative  . Not on file   Social Determinants of Health   Financial Resource Strain:   . Difficulty of Paying Living Expenses:   Food Insecurity:   . Worried About Charity fundraiser in the Last Year:   . Arboriculturist in the Last Year:   Transportation Needs:   . Film/video editor (Medical):   [name] Lack of Transportation (Non-Medical):   Physical Activity:   . Days of Exercise per Week:   . Minutes of Exercise per Session:   Stress:   . Feeling of Stress :   Social Connections:   . Frequency of Communication with Friends and Family:   . Frequency of Social Gatherings with Friends and Family:   . Attends Religious Services:   . Active Member of Clubs or Organizations:   . Attends Archivist Meetings:   [name] Marital Status:   Intimate Partner Violence:   . Fear of Current or Ex-Partner:   . Emotionally Abused:   [name] Physically Abused:   . Sexually Abused:      Current Outpatient Medications:  .  acetaminophen (TYLENOL) 500 MG tablet, Take 1,000 mg by mouth every 6 (six) hours as needed for moderate pain or headache., Disp: , Rfl:  .  albuterol (PROVENTIL HFA;VENTOLIN HFA) 108 (90 Base) MCG/ACT inhaler, Inhale 1-2 puffs into the lungs every 4 (four) hours as needed for wheezing or shortness of breath. , Disp: , Rfl:  .  amiodarone (PACERONE) 200 MG tablet, Take 200 mg by mouth daily., Disp: , Rfl:  .  apixaban (ELIQUIS) 5 MG TABS tablet, Take 5 mg by mouth 2 (two) times daily. , Disp: , Rfl:  .  BD PEN NEEDLE MICRO U/F 32G X 6 MM MISC, SMARTSIG:1 Needle SUB-Q Daily, Disp: , Rfl:  .  cholecalciferol (VITAMIN D3) 25 MCG (1000 UT) tablet, Take 1,000 Units by mouth daily., Disp: , Rfl:  .  Cyanocobalamin (B-12) 5000 MCG CAPS, Take 5,000 mcg by mouth daily., Disp: , Rfl:  .  Fluticasone-Umeclidin-Vilant (TRELEGY ELLIPTA) 100-62.5-25 MCG/INH AEPB, Inhale 1 puff into the lungs daily., Disp: , Rfl:  .  glipiZIDE (GLUCOTROL) 10 MG tablet, Take 1  tablet (10 mg total) by mouth 2 (two) times daily before a meal., Disp: 180 tablet, Rfl: 0 .  HYDROcodone-acetaminophen (NORCO) 10-325 MG tablet, Take 1 tablet by mouth every 4 (four) hours as needed for moderate pain. , Disp: , Rfl:  .  Insulin Glargine-Lixisenatide (SOLIQUA) 100-33 UNT-MCG/ML SOPN, Inject 40 Units into the skin daily. , Disp: , Rfl:  .  metolazone (ZAROXOLYN) 2.5 MG tablet, Take 2.5 mg by mouth 2 (two) times a week., Disp: , Rfl:  .  metoprolol tartrate (LOPRESSOR) 50 MG tablet, Take 1 tablet (50 mg total) by mouth every 6 (six) hours. (Patient taking differently: Take 50 mg by mouth in the morning, at noon, and at bedtime. ), Disp: 360 tablet, Rfl: 2 .  Omega-3 Fatty Acids (OMEGA-3 FISH OIL CONCENTRATE) 1000 MG CPDR,  Take 2,000 mg by mouth 2 (two) times a day., Disp: , Rfl:  .  omeprazole (PRILOSEC) 20 MG capsule, Take 20 mg by mouth 2 (two) times daily before a meal. , Disp: , Rfl:  .  rosuvastatin (CRESTOR) 20 MG tablet, Take 1 tablet by mouth once daily, Disp: 90 tablet, Rfl: 0 .  sertraline (ZOLOFT) 50 MG tablet, Take 1 tablet by mouth twice daily, Disp: 180 tablet, Rfl: 0 .  torsemide (DEMADEX) 20 MG tablet, Take 1 tablet (20 mg total) by mouth 2 (two) times daily. Take an extra tablet (20 mg) in the afternoon on Monday, Wednesday, Friday. (Patient taking differently: Take 40 mg by mouth 2 (two) times daily. ), Disp: 72 tablet, Rfl: 3 .  CINNAMON PO, Take 700 mg by mouth daily. , Disp: , Rfl:  .  ferrous sulfate 324 MG TBEC, Take 324 mg by mouth daily with breakfast., Disp: , Rfl:  .  folic acid (FOLVITE) Q000111Q MCG tablet, Take 800 mcg by mouth daily., Disp: , Rfl:  .  ipratropium-albuterol (DUONEB) 0.5-2.5 (3) MG/3ML SOLN, Take 3 mLs by nebulization every 6 (six) hours as needed (shortness of breath). , Disp: , Rfl:  .  levocetirizine (XYZAL) 5 MG tablet, Take 5 mg by mouth daily as needed for allergies., Disp: , Rfl:  .  magnesium chloride (SLOW-MAG) 64 MG TBEC SR tablet, Take  1 tablet (64 mg total) by mouth 2 (two) times daily., Disp: 60 tablet, Rfl: 3 .  montelukast (SINGULAIR) 10 MG tablet, TAKE 1 TABLET BY MOUTH ONCE DAILY IN THE EVENING, Disp: 90 tablet, Rfl: 0 .  potassium chloride SA (K-DUR) 20 MEQ tablet, Take 20 mEq by mouth 2 (two) times daily., Disp: , Rfl:  .  pyridOXINE (VITAMIN B-6) 100 MG tablet, Take 100 mg by mouth daily., Disp: , Rfl:  .  tamsulosin (FLOMAX) 0.4 MG CAPS capsule, TAKE 1 CAPSULE BY MOUTH BEFORE BEDTIME, Disp: 90 capsule, Rfl: 0   Allergies  Allergen Reactions  . Androgel [Testosterone] Other (See Comments)    Pedal edema   . Biaxin [Clarithromycin] Other (See Comments)    Taste perception alteration  . Duloxetine Other (See Comments)    Hallucinations  . Gabapentin Other (See Comments)    hallucinations  . Ibuprofen Other (See Comments)    GI upset   . Lyrica [Pregabalin] Swelling  . Spironolactone Other (See Comments)    ROS CONSTITUTIONAL: Negative for chills, fatigue, fever, unintentional weight gain and unintentional weight loss.  E/N/T: Negative for ear pain, nasal congestion and sore throat.  CARDIOVASCULAR: Negative for chest pain, dizziness, palpitations and pedal edema.  RESPIRATORY: Negative for recent cough and dyspnea.  GASTROINTESTINAL: Negative for abdominal pain, acid reflux symptoms, constipation, diarrhea, nausea and vomiting.  MSK: Negative for arthralgias and myalgias.  INTEGUMENTARY: Negative for rash.  NEUROLOGICAL: Negative for dizziness and headaches.  PSYCHIATRIC: Negative for sleep disturbance and to question depression screen.  Negative for depression, negative for anhedonia.        Objective:    PHYSICAL EXAM:   VS: BP 134/70   Pulse 78   Temp 97.7 F (36.5 C)   Ht 5\' 11"  (1.803 m)   Wt (!) 346 lb (156.9 kg)   BMI 48.26 kg/m   GEN: Well nourished, well developed, in no acute distress  or pharynx Neck: no JVD or masses - no thyromegaly Cardiac: RRR; no murmurs, rubs, or  gallops, has 1+ bilateral edema - no significant varicosities Respiratory:  normal respiratory  rate and pattern with no distress - normal breath sounds with no rales, rhonchi, wheezes or rubs  MS: no deformity or atrophy  Skin: warm and dry, no rash  Neuro:  Alert and Oriented x 3, Strength and sensation are intact - CN II-Xii grossly intact Psych: euthymic mood, appropriate affect and demeanor  BP 134/70   Pulse 78   Temp 97.7 F (36.5 C)   Ht 5\' 11"  (1.803 m)   Wt (!) 346 lb (156.9 kg)   BMI 48.26 kg/m  Wt Readings from Last 3 Encounters:  02/23/20 (!) 346 lb (156.9 kg)  12/04/19 (!) 337 lb (152.9 kg)  06/14/19 (!) 341 lb 9.6 oz (154.9 kg)     Health Maintenance Due  Topic Date Due  . HEMOGLOBIN A1C  Never done  . Hepatitis C Screening  Never done  . FOOT EXAM  Never done  . OPHTHALMOLOGY EXAM  Never done  . URINE MICROALBUMIN  Never done  . TETANUS/TDAP  Never done  . COLONOSCOPY  Never done  . PNA vac Low Risk Adult (1 of 2 - PCV13) Never done    There are no preventive care reminders to display for this patient.  No results found for: TSH Lab Results  Component Value Date   WBC 9.5 11/25/2018   HGB 14.5 11/25/2018   HCT 44.5 11/25/2018   MCV 83 11/25/2018   PLT 216 11/25/2018   Lab Results  Component Value Date   NA 143 11/25/2018   K 4.0 11/25/2018   CO2 26 11/25/2018   GLUCOSE 108 (H) 11/25/2018   BUN 16 11/25/2018   CREATININE 1.57 (H) 11/25/2018   CALCIUM 9.8 11/25/2018   No results found for: CHOL No results found for: HDL No results found for: LDLCALC No results found for: TRIG No results found for: CHOLHDL No results found for: HGBA1C    Assessment & Plan:   Problem List Items Addressed This Visit      Cardiovascular and Mediastinum   RESOLVED: Hypertensive heart disease - Primary     Respiratory   COPD (chronic obstructive pulmonary disease) ([place])    Continue current meds as directed        Digestive   Gastroesophageal reflux  disease without esophagitis    Continue meds as directed        Endocrine   Diabetes mellitus due to underlying condition, controlled, with diabetic polyneuropathy, with long-term current use of insulin ([place])    Well controlled.  No changes to medicines.  Continue to work on eating a healthy diet and exercise.  Labs drawn today.        Relevant Orders   Hemoglobin A1c     Other   Hyperlipidemia    Well controlled.  No changes to medicines.  Continue to work on eating a healthy diet and exercise.  Labs drawn today.        Relevant Orders   Lipid panel      No orders of the defined types were placed in this encounter.   Follow-up: Return in about 3 months (around 05/25/2020) for with Dr Tobie Poet for chronic fasting follow up.    SARA R Daishia Fetterly, PA-C

## 2020-02-24 LAB — CBC WITH DIFFERENTIAL/PLATELET
Basophils Absolute: 0 10*3/uL (ref 0.0–0.2)
Basos: 1 %
EOS (ABSOLUTE): 0.4 10*3/uL (ref 0.0–0.4)
Eos: 4 %
Hematocrit: 42.6 % (ref 37.5–51.0)
Hemoglobin: 14 g/dL (ref 13.0–17.7)
Immature Grans (Abs): 0 10*3/uL (ref 0.0–0.1)
Immature Granulocytes: 0 %
Lymphocytes Absolute: 2.9 10*3/uL (ref 0.7–3.1)
Lymphs: 32 %
MCH: 30.4 pg (ref 26.6–33.0)
MCHC: 32.9 g/dL (ref 31.5–35.7)
MCV: 92 fL (ref 79–97)
Monocytes Absolute: 0.6 10*3/uL (ref 0.1–0.9)
Monocytes: 6 %
Neutrophils Absolute: 5 10*3/uL (ref 1.4–7.0)
Neutrophils: 57 %
Platelets: 177 10*3/uL (ref 150–450)
RBC: 4.61 x10E6/uL (ref 4.14–5.80)
RDW: 12.9 % (ref 11.6–15.4)
WBC: 8.8 10*3/uL (ref 3.4–10.8)

## 2020-02-24 LAB — HEMOGLOBIN A1C
Est. average glucose Bld gHb Est-mCnc: 212 mg/dL
Hgb A1c MFr Bld: 9 % — ABNORMAL HIGH (ref 4.8–5.6)

## 2020-02-24 LAB — COMPREHENSIVE METABOLIC PANEL
ALT: 35 IU/L (ref 0–44)
AST: 38 IU/L (ref 0–40)
Albumin/Globulin Ratio: 1.7 (ref 1.2–2.2)
Albumin: 3.9 g/dL (ref 3.7–4.7)
Alkaline Phosphatase: 130 IU/L — ABNORMAL HIGH (ref 39–117)
BUN/Creatinine Ratio: 12 (ref 10–24)
BUN: 20 mg/dL (ref 8–27)
Bilirubin Total: 0.6 mg/dL (ref 0.0–1.2)
CO2: 29 mmol/L (ref 20–29)
Calcium: 9.9 mg/dL (ref 8.6–10.2)
Chloride: 103 mmol/L (ref 96–106)
Creatinine, Ser: 1.73 mg/dL — ABNORMAL HIGH (ref 0.76–1.27)
GFR calc Af Amer: 44 mL/min/{1.73_m2} — ABNORMAL LOW (ref >59)
GFR calc non Af Amer: 38 mL/min/{1.73_m2} — ABNORMAL LOW (ref >59)
Globulin, Total: 2.3 g/dL (ref 1.5–4.5)
Glucose: 106 mg/dL — ABNORMAL HIGH (ref 65–99)
Potassium: 4.2 mmol/L (ref 3.5–5.2)
Sodium: 144 mmol/L (ref 134–144)
Total Protein: 6.2 g/dL (ref 6.0–8.5)

## 2020-02-24 LAB — LIPID PANEL
Chol/HDL Ratio: 3.9 ratio (ref 0.0–5.0)
Cholesterol, Total: 139 mg/dL (ref 100–199)
HDL: 36 mg/dL — ABNORMAL LOW (ref >39)
LDL Chol Calc (NIH): 76 mg/dL (ref 0–99)
Triglycerides: 158 mg/dL — ABNORMAL HIGH (ref 0–149)
VLDL Cholesterol Cal: 27 mg/dL (ref 5–40)

## 2020-02-24 LAB — CARDIOVASCULAR RISK ASSESSMENT

## 2020-03-18 ENCOUNTER — Other Ambulatory Visit: Payer: Self-pay

## 2020-03-18 DIAGNOSIS — Z794 Long term (current) use of insulin: Secondary | ICD-10-CM

## 2020-03-18 DIAGNOSIS — N1831 Chronic kidney disease, stage 3a: Secondary | ICD-10-CM

## 2020-03-18 DIAGNOSIS — E0842 Diabetes mellitus due to underlying condition with diabetic polyneuropathy: Secondary | ICD-10-CM

## 2020-03-18 DIAGNOSIS — Z7901 Long term (current) use of anticoagulants: Secondary | ICD-10-CM

## 2020-03-18 DIAGNOSIS — E559 Vitamin D deficiency, unspecified: Secondary | ICD-10-CM

## 2020-03-18 MED ORDER — TRELEGY ELLIPTA 100-62.5-25 MCG/INH IN AEPB: 1.0000 | INHALATION_SPRAY | Freq: Every day | RESPIRATORY_TRACT | 2 refills | Status: DC

## 2020-03-19 ENCOUNTER — Ambulatory Visit: Payer: Medicare HMO

## 2020-03-19 ENCOUNTER — Other Ambulatory Visit: Payer: Self-pay

## 2020-03-19 DIAGNOSIS — E0842 Diabetes mellitus due to underlying condition with diabetic polyneuropathy: Secondary | ICD-10-CM

## 2020-03-19 DIAGNOSIS — Z794 Long term (current) use of insulin: Secondary | ICD-10-CM

## 2020-03-19 DIAGNOSIS — E559 Vitamin D deficiency, unspecified: Secondary | ICD-10-CM

## 2020-03-19 DIAGNOSIS — N1831 Chronic kidney disease, stage 3a: Secondary | ICD-10-CM

## 2020-03-20 LAB — COMPREHENSIVE METABOLIC PANEL
ALT: 36 IU/L (ref 0–44)
AST: 44 IU/L — ABNORMAL HIGH (ref 0–40)
Albumin/Globulin Ratio: 1.9 (ref 1.2–2.2)
Albumin: 4.3 g/dL (ref 3.7–4.7)
Alkaline Phosphatase: 129 IU/L — ABNORMAL HIGH (ref 39–117)
BUN/Creatinine Ratio: 12 (ref 10–24)
BUN: 20 mg/dL (ref 8–27)
Bilirubin Total: 1 mg/dL (ref 0.0–1.2)
CO2: 30 mmol/L — ABNORMAL HIGH (ref 20–29)
Calcium: 10.1 mg/dL (ref 8.6–10.2)
Chloride: 92 mmol/L — ABNORMAL LOW (ref 96–106)
Creatinine, Ser: 1.65 mg/dL — ABNORMAL HIGH (ref 0.76–1.27)
GFR calc Af Amer: 46 mL/min/{1.73_m2} — ABNORMAL LOW (ref >59)
GFR calc non Af Amer: 40 mL/min/{1.73_m2} — ABNORMAL LOW (ref >59)
Globulin, Total: 2.3 g/dL (ref 1.5–4.5)
Glucose: 367 mg/dL — ABNORMAL HIGH (ref 65–99)
Potassium: 4.1 mmol/L (ref 3.5–5.2)
Sodium: 140 mmol/L (ref 134–144)
Total Protein: 6.6 g/dL (ref 6.0–8.5)

## 2020-03-20 LAB — CBC
Hematocrit: 46.9 % (ref 37.5–51.0)
Hemoglobin: 15.5 g/dL (ref 13.0–17.7)
MCH: 30.9 pg (ref 26.6–33.0)
MCHC: 33 g/dL (ref 31.5–35.7)
MCV: 94 fL (ref 79–97)
Platelets: 160 10*3/uL (ref 150–450)
RBC: 5.01 x10E6/uL (ref 4.14–5.80)
RDW: 12.9 % (ref 11.6–15.4)
WBC: 8.7 10*3/uL (ref 3.4–10.8)

## 2020-03-20 LAB — IRON,TIBC AND FERRITIN PANEL
Ferritin: 73 ng/mL (ref 30–400)
Iron Saturation: 22 % (ref 15–55)
Iron: 72 ug/dL (ref 38–169)
Total Iron Binding Capacity: 326 ug/dL (ref 250–450)
UIBC: 254 ug/dL (ref 111–343)

## 2020-03-20 LAB — PARATHYROID HORMONE, INTACT (NO CA): PTH: 68 pg/mL — ABNORMAL HIGH (ref 15–65)

## 2020-03-20 LAB — VITAMIN D 25 HYDROXY (VIT D DEFICIENCY, FRACTURES): Vit D, 25-Hydroxy: 23.5 ng/mL — ABNORMAL LOW (ref 30.0–100.0)

## 2020-03-24 ENCOUNTER — Other Ambulatory Visit: Payer: Self-pay | Admitting: Family Medicine

## 2020-04-06 ENCOUNTER — Other Ambulatory Visit: Payer: Self-pay | Admitting: Family Medicine

## 2020-04-08 ENCOUNTER — Other Ambulatory Visit: Payer: Self-pay

## 2020-04-08 MED ORDER — MONTELUKAST SODIUM 10 MG PO TABS: 10.0000 mg | ORAL_TABLET | Freq: Every evening | ORAL | 3 refills | Status: DC

## 2020-04-08 MED ORDER — VENTOLIN HFA 108 (90 BASE) MCG/ACT IN AERS: 2.0000 | INHALATION_SPRAY | RESPIRATORY_TRACT | 11 refills | Status: DC | PRN

## 2020-04-11 ENCOUNTER — Ambulatory Visit (INDEPENDENT_AMBULATORY_CARE_PROVIDER_SITE_OTHER): Payer: Medicare HMO | Admitting: Legal Medicine

## 2020-04-11 ENCOUNTER — Other Ambulatory Visit: Payer: Self-pay

## 2020-04-11 ENCOUNTER — Encounter: Payer: Self-pay | Admitting: Legal Medicine

## 2020-04-11 VITALS — BP 170/80 | HR 85 | Resp 20 | Ht 72.0 in | Wt 347.0 lb

## 2020-04-11 DIAGNOSIS — N3942 Incontinence without sensory awareness: Secondary | ICD-10-CM

## 2020-04-11 DIAGNOSIS — Z6841 Body Mass Index (BMI) 40.0 and over, adult: Secondary | ICD-10-CM | POA: Insufficient documentation

## 2020-04-11 DIAGNOSIS — N481 Balanitis: Secondary | ICD-10-CM | POA: Diagnosis not present

## 2020-04-11 MED ORDER — FREEDOM CATH MALE EXT CATHETER MISC: 1.0000 | Freq: Every day | 3 refills | Status: DC

## 2020-04-11 MED ORDER — FLUCONAZOLE 100 MG PO TABS: 100.0000 mg | ORAL_TABLET | Freq: Every day | ORAL | 0 refills | Status: DC

## 2020-04-11 MED ORDER — CLOTRIMAZOLE-BETAMETHASONE 1-0.05 % EX CREA: 1.0000 "application " | TOPICAL_CREAM | Freq: Two times a day (BID) | CUTANEOUS | 2 refills | Status: DC

## 2020-04-11 NOTE — Assessment & Plan Note (Signed)
Patient has balanitis with urinary incontinence.  Appears due to yeast.  Start diflucan and lotrisone cream for control.  I ordered condom catheters for him.

## 2020-04-11 NOTE — Progress Notes (Signed)
Established Patient Office Visit  Subjective:  Patient ID: Shawn Sullivan, male    DOB: [birth year]  Age: 74 y.o. MRN: [id number]  CC:  Chief Complaint  Patient presents with  . penis infection    HPI Shawn Sullivan presents for balanitis.  He wear diapers for incontinence.  He has rash and burning for 2 weeks.  No fever or chills.  Past Medical History:  Diagnosis Date  . Anticoagulated 07/01/2018  . Anticoagulated 07/01/2018  . Benign essential hypertension 05/06/2013  . Chronic obstructive pulmonary disease ([place]) 11/05/2015  . CKD (chronic kidney disease) stage 2, GFR 60-89 ml/min 07/01/2018  . COPD (chronic obstructive pulmonary disease) ([place]) 07/01/2018  . Dermatochalasis 12/16/2015  . Disorder of bursae and tendons in shoulder region 01/05/2014  . Generalized edema 06/22/2016  . Hyperlipidemia 12/16/2015  . Hypertensive heart disease 07/01/2018  . Low back pain 08/17/2013  . Lumbosacral spondylosis 05/06/2013  . Obstructive sleep apnea 07/01/2018  . Persistent atrial fibrillation ([place]) 07/01/2018  . Piriformis syndrome 05/03/2014  . Postlaminectomy syndrome, lumbar region 05/06/2013  . Presbyopia of both eyes 12/16/2015  . Restless legs syndrome 03/23/2016  . Restrictive lung disease 06/22/2016  . Sleep apnea 05/06/2013  . Type 2 diabetes mellitus without complications ([place]) 99991111    Past Surgical History:  Procedure Laterality Date  . APPENDECTOMY    . BACK SURGERY    . CARDIOVERSION N/A 12/01/2018   Procedure: CARDIOVERSION;  Surgeon: Sanda Klein, MD;  Location: [facility];  Service: Cardiovascular;  Laterality: N/A;  . CATARACT EXTRACTION Bilateral   . LUMBAR FUSION    . NECK SURGERY      Family History  Problem Relation Age of Onset  . Atrial fibrillation Mother   . Cancer Mother   . Heart disease Father     Social History   Socioeconomic History  . Marital status: Married    Spouse name: Not on file  . Number of children: Not on file  . Years of education:  Not on file  . Highest education level: Not on file  Occupational History  . Not on file  Tobacco Use  . Smoking status: Former Smoker    Quit date: 1993    Years since quitting: 28.3  . Smokeless tobacco: Never Used  Substance and Sexual Activity  . Alcohol use: Not Currently  . Drug use: Not Currently    Types: Hydrocodone  . Sexual activity: Not on file  Other Topics Concern  . Not on file  Social History Narrative  . Not on file   Social Determinants of Health   Financial Resource Strain:   . Difficulty of Paying Living Expenses:   Food Insecurity:   . Worried About Charity fundraiser in the Last Year:   . Arboriculturist in the Last Year:   Transportation Needs:   . Film/video editor (Medical):   [name] Lack of Transportation (Non-Medical):   Physical Activity:   . Days of Exercise per Week:   . Minutes of Exercise per Session:   Stress:   . Feeling of Stress :   Social Connections:   . Frequency of Communication with Friends and Family:   . Frequency of Social Gatherings with Friends and Family:   . Attends Religious Services:   . Active Member of Clubs or Organizations:   . Attends Archivist Meetings:   [name] Marital Status:   Intimate Partner Violence:   . Fear of Current or Ex-Partner:   .  Emotionally Abused:   [name] Physically Abused:   . Sexually Abused:     Outpatient Medications Prior to Visit  Medication Sig Dispense Refill  . acetaminophen (TYLENOL) 500 MG tablet Take 1,000 mg by mouth every 6 (six) hours as needed for moderate pain or headache.    [name] amiodarone (PACERONE) 200 MG tablet Take 200 mg by mouth daily.    [name] apixaban (ELIQUIS) 5 MG TABS tablet Take 5 mg by mouth 2 (two) times daily.     . BD PEN NEEDLE MICRO U/F 32G X 6 MM MISC SMARTSIG:1 Needle SUB-Q Daily    . cholecalciferol (VITAMIN D3) 25 MCG (1000 UT) tablet Take 1,000 Units by mouth daily.    [name] CINNAMON PO Take 700 mg by mouth daily.     . Cyanocobalamin (B-12) 5000 MCG CAPS  Take 5,000 mcg by mouth daily.    . ferrous sulfate 324 MG TBEC Take 324 mg by mouth daily with breakfast.    . Fluticasone-Umeclidin-Vilant (TRELEGY ELLIPTA) 100-62.5-25 MCG/INH AEPB Inhale 1 puff into the lungs daily. 1 each 2  . folic acid (FOLVITE) Q000111Q MCG tablet Take 800 mcg by mouth daily.    [name] glipiZIDE (GLUCOTROL) 10 MG tablet Take 1 tablet (10 mg total) by mouth 2 (two) times daily before a meal. 180 tablet 0  . HYDROcodone-acetaminophen (NORCO) 10-325 MG tablet Take 1 tablet by mouth every 4 (four) hours as needed for moderate pain.     . Insulin Glargine-Lixisenatide (SOLIQUA) 100-33 UNT-MCG/ML SOPN Inject 40 Units into the skin daily.     [name] ipratropium-albuterol (DUONEB) 0.5-2.5 (3) MG/3ML SOLN Take 3 mLs by nebulization every 6 (six) hours as needed (shortness of breath).     [name] levocetirizine (XYZAL) 5 MG tablet Take 5 mg by mouth daily as needed for allergies.    . magnesium chloride (SLOW-MAG) 64 MG TBEC SR tablet Take 1 tablet (64 mg total) by mouth 2 (two) times daily. 60 tablet 3  . metolazone (ZAROXOLYN) 2.5 MG tablet Take 2.5 mg by mouth 2 (two) times a week.    . metoprolol tartrate (LOPRESSOR) 50 MG tablet Take 1 tablet (50 mg total) by mouth every 6 (six) hours. (Patient taking differently: Take 50 mg by mouth in the morning, at noon, and at bedtime. ) 360 tablet 2  . montelukast (SINGULAIR) 10 MG tablet Take 1 tablet (10 mg total) by mouth every evening. 90 tablet 3  . Omega-3 Fatty Acids (OMEGA-3 FISH OIL CONCENTRATE) 1000 MG CPDR Take 2,000 mg by mouth 2 (two) times a day.    [name] omeprazole (PRILOSEC) 20 MG capsule Take 20 mg by mouth 2 (two) times daily before a meal.     . potassium chloride SA (K-DUR) 20 MEQ tablet Take 20 mEq by mouth 2 (two) times daily.    [name] pyridOXINE (VITAMIN B-6) 100 MG tablet Take 100 mg by mouth daily.    . rosuvastatin (CRESTOR) 20 MG tablet Take 1 tablet by mouth once daily 90 tablet 0  . sertraline (ZOLOFT) 50 MG tablet Take 1 tablet by mouth  twice daily 180 tablet 0  . tamsulosin (FLOMAX) 0.4 MG CAPS capsule TAKE 1 CAPSULE BY MOUTH BEFORE BEDTIME 90 capsule 0  . torsemide (DEMADEX) 20 MG tablet Take 1 tablet (20 mg total) by mouth 2 (two) times daily. Take an extra tablet (20 mg) in the afternoon on Monday, Wednesday, Friday. (Patient taking differently: Take 40 mg by mouth 2 (two) times daily. ) 72 tablet 3  .  VENTOLIN HFA 108 (90 Base) MCG/ACT inhaler Inhale 2 puffs into the lungs every 4 (four) hours as needed for wheezing or shortness of breath. 18 g 11   No facility-administered medications prior to visit.    Allergies  Allergen Reactions  . Androgel [Testosterone] Other (See Comments)    Pedal edema   . Biaxin [Clarithromycin] Other (See Comments)    Taste perception alteration  . Duloxetine Other (See Comments)    Hallucinations  . Gabapentin Other (See Comments)    hallucinations  . Ibuprofen Other (See Comments)    GI upset   . Lyrica [Pregabalin] Swelling  . Spironolactone Other (See Comments)    ROS Review of Systems  Constitutional: Negative.   HENT: Negative.   Eyes: Negative.   Respiratory: Negative.   Cardiovascular: Negative.   Gastrointestinal: Negative.   Genitourinary: Positive for penile pain.       Incontinence  Musculoskeletal: Negative.   Skin: Negative.   Neurological: Negative.   Psychiatric/Behavioral: Negative.       Objective:    Physical Exam  Constitutional: He appears well-developed and well-nourished.  Cardiovascular: Normal rate, regular rhythm, normal heart sounds and intact distal pulses.  Pulmonary/Chest: Effort normal and breath sounds normal.  Genitourinary:    Penile tenderness present.    Genitourinary Comments: Patient has candida infection on scrotum and glans penis which is red and swollen foreskin   Vitals reviewed.   BP (!) 170/80   Pulse 85   Resp 20   Ht 6' (1.829 m)   Wt (!) 347 lb (157.4 kg)   SpO2 92%   BMI 47.06 kg/m  Wt Readings from Last  3 Encounters:  04/11/20 (!) 347 lb (157.4 kg)  02/23/20 (!) 346 lb (156.9 kg)  12/04/19 (!) 337 lb (152.9 kg)     Health Maintenance Due  Topic Date Due  . Hepatitis C Screening  Never done  . FOOT EXAM  Never done  . OPHTHALMOLOGY EXAM  Never done  . URINE MICROALBUMIN  Never done  . TETANUS/TDAP  Never done  . PNA vac Low Risk Adult (1 of 2 - PCV13) Never done    There are no preventive care reminders to display for this patient.  No results found for: TSH Lab Results  Component Value Date   WBC 8.7 03/19/2020   HGB 15.5 03/19/2020   HCT 46.9 03/19/2020   MCV 94 03/19/2020   PLT 160 03/19/2020   Lab Results  Component Value Date   NA 140 03/19/2020   K 4.1 03/19/2020   CO2 30 (H) 03/19/2020   GLUCOSE 367 (H) 03/19/2020   BUN 20 03/19/2020   CREATININE 1.65 (H) 03/19/2020   BILITOT 1.0 03/19/2020   ALKPHOS 129 (H) 03/19/2020   AST 44 (H) 03/19/2020   ALT 36 03/19/2020   PROT 6.6 03/19/2020   ALBUMIN 4.3 03/19/2020   CALCIUM 10.1 03/19/2020   Lab Results  Component Value Date   CHOL 139 02/23/2020   Lab Results  Component Value Date   HDL 36 (L) 02/23/2020   Lab Results  Component Value Date   LDLCALC 76 02/23/2020   Lab Results  Component Value Date   TRIG 158 (H) 02/23/2020   Lab Results  Component Value Date   CHOLHDL 3.9 02/23/2020   Lab Results  Component Value Date   HGBA1C 9.0 (H) 02/23/2020      Assessment & Plan:   Problem List Items Addressed This Visit      Genitourinary  Balanitis    Patient has balanitis with urinary incontinence.  Appears due to yeast.  Start diflucan and lotrisone cream for control.  I ordered condom catheters for him.      Relevant Medications   fluconazole (DIFLUCAN) 100 MG tablet   clotrimazole-betamethasone (LOTRISONE) cream     Other   BMI 45.0-49.9, adult (HCC)    Morbid obesity An individualize plan was formulated using patient history and physical exam to encourage weight loss.  An evidence  based program was formulated.  Patient is to cut portion size with meals and to plan physical exercise 3 days a week at least 20 minutes.  Weight watchers and other programs are helpful.  Planned amount of weight loss 10 lbs. He needs meticulous groin hygiene       Other Visit Diagnoses    Urinary incontinence without sensory awareness    -  Primary   Relevant Medications   Catheters (FREEDOM CATH MALE EXT CATHETER) MISC      Meds ordered this encounter  Medications  . fluconazole (DIFLUCAN) 100 MG tablet    Sig: Take 1 tablet (100 mg total) by mouth daily.    Dispense:  10 tablet    Refill:  0  . clotrimazole-betamethasone (LOTRISONE) cream    Sig: Apply 1 application topically 2 (two) times daily.    Dispense:  30 g    Refill:  2  . Catheters (FREEDOM CATH MALE EXT CATHETER) MISC    Sig: 1 each by Does not apply route daily.    Dispense:  30 each    Refill:  3    Follow-up: Return if symptoms worsen or fail to improve.    Reinaldo Meeker, MD

## 2020-04-11 NOTE — Assessment & Plan Note (Signed)
Morbid obesity An individualize plan was formulated using patient history and physical exam to encourage weight loss.  An evidence based program was formulated.  Patient is to cut portion size with meals and to plan physical exercise 3 days a week at least 20 minutes.  Weight watchers and other programs are helpful.  Planned amount of weight loss 10 lbs. He needs meticulous groin hygiene

## 2020-05-15 ENCOUNTER — Other Ambulatory Visit: Payer: Self-pay | Admitting: Physician Assistant

## 2020-05-15 MED ORDER — ROSUVASTATIN CALCIUM 20 MG PO TABS: 20.0000 mg | ORAL_TABLET | Freq: Every day | ORAL | 1 refills | Status: DC

## 2020-05-16 ENCOUNTER — Telehealth: Payer: Self-pay

## 2020-05-16 NOTE — Telephone Encounter (Signed)
Hailey called to report that he has a rash on his face and he is concerned that he has MRSA.  Dr. Tobie Poet advised that he come in tomorrow morning to be evaluated.  He is going to go to the Urgent Care this afternoon for evaluation and treatment.

## 2020-05-27 ENCOUNTER — Ambulatory Visit: Payer: Medicare HMO | Admitting: Family Medicine

## 2020-05-31 ENCOUNTER — Encounter: Payer: Self-pay | Admitting: Physician Assistant

## 2020-05-31 ENCOUNTER — Other Ambulatory Visit: Payer: Self-pay

## 2020-05-31 ENCOUNTER — Ambulatory Visit (INDEPENDENT_AMBULATORY_CARE_PROVIDER_SITE_OTHER): Payer: Medicare HMO | Admitting: Physician Assistant

## 2020-05-31 VITALS — BP 132/70 | HR 72 | Temp 97.6°F | Resp 18 | Ht 72.0 in | Wt 342.0 lb

## 2020-05-31 DIAGNOSIS — I130 Hypertensive heart and chronic kidney disease with heart failure and stage 1 through stage 4 chronic kidney disease, or unspecified chronic kidney disease: Secondary | ICD-10-CM | POA: Diagnosis not present

## 2020-05-31 DIAGNOSIS — E782 Mixed hyperlipidemia: Secondary | ICD-10-CM

## 2020-05-31 DIAGNOSIS — Z794 Long term (current) use of insulin: Secondary | ICD-10-CM

## 2020-05-31 DIAGNOSIS — E119 Type 2 diabetes mellitus without complications: Secondary | ICD-10-CM | POA: Diagnosis not present

## 2020-05-31 DIAGNOSIS — F419 Anxiety disorder, unspecified: Secondary | ICD-10-CM | POA: Insufficient documentation

## 2020-05-31 DIAGNOSIS — I5032 Chronic diastolic (congestive) heart failure: Secondary | ICD-10-CM

## 2020-05-31 DIAGNOSIS — N183 Chronic kidney disease, stage 3 unspecified: Secondary | ICD-10-CM

## 2020-05-31 DIAGNOSIS — I482 Chronic atrial fibrillation, unspecified: Secondary | ICD-10-CM

## 2020-05-31 DIAGNOSIS — J449 Chronic obstructive pulmonary disease, unspecified: Secondary | ICD-10-CM

## 2020-05-31 HISTORY — DX: Chronic atrial fibrillation, unspecified: I48.20

## 2020-05-31 LAB — POCT UA - MICROALBUMIN: Microalbumin Ur, POC: 150 mg/L

## 2020-05-31 MED ORDER — ROPINIROLE HCL 0.5 MG PO TABS: 0.5000 mg | ORAL_TABLET | Freq: Every day | ORAL | 2 refills | Status: DC

## 2020-05-31 NOTE — Assessment & Plan Note (Signed)
Well controlled.  ?No changes to medicines.  ?Continue to work on eating a healthy diet and exercise.  ?Labs drawn today.  ?

## 2020-05-31 NOTE — Progress Notes (Signed)
Established Patient Office Visit  Subjective:  Patient ID: Shawn Sullivan, male    DOB: 1946/10/25  Age: 74 y.o. MRN: [id number]  CC:  Chief Complaint  Patient presents with  . COPD  . Diabetes  . Chronic Kidney Disease    HPI Shawn Sullivan presents for follow up diabetes  Shawn Sullivan presents with type 2 diabetes mellitus with diabetic polyneuropathy.  Specifically, this is type 2, insulin requiring diabetes, complicated by nephropathy and peripheral neuropathy.  Compliance with treatment has been good; he takes his medication as directed, follows up as directed,   FBS: 100s Current meds include an oral hypoglycemic ( Glipizide ( 10 mg one twice a day ) ) and insulin/injectable ( soliqua 50 U daily. ).  The hypoglycemic episodes are not present.Shawn Sullivan He checks his glucose 2 times per day.  In regard to preventative care, he performs foot self-exams daily and his last ophthalmology exam was in 12/2017.      Pt presents with hyperlipidemia.  Current treatment includes Crestor and a low cholesterol/low fat diet.  Compliance with treatment has been good; he takes his medication as directed and follows up as directed.      Dx with chronic obstructive pulmonary disease, unspecified; the duration of COPD has been several years.  Patient has an established diagnosis of COPD.  medicaions include trelegy and ventolin.  using duoneb/ventolin - rescue. States with recent weather changes he has used his meds consistently    In regard to the other persistent atrial fibrillation, he is here for routine follow-up for atrial fibrillation. Current related medications include amiodarone,  metoprolol for rate control, and Eliquis.   He is extremely compliant with his medication regimen. [facility] [place] [facility]ology regularly     Shawn Sullivan presents with a diagnosis of gastro-esophageal reflux disease without esophagitis.  Taking omeprazole.  Pt states he is restless at night and anxious - would like to try medication  to help those symptoms (and possibly help with his neuropathy? - tried lyrica and gabapentin in past - did not tolerate) ROS:   Past Medical History:  Diagnosis Date  . Anticoagulated 07/01/2018  . Anticoagulated 07/01/2018  . Benign essential hypertension 05/06/2013  . Chronic obstructive pulmonary disease ([place]) 11/05/2015  . CKD (chronic kidney disease) stage 2, GFR 60-89 ml/min 07/01/2018  . COPD (chronic obstructive pulmonary disease) ([place]) 07/01/2018  . Dermatochalasis 12/16/2015  . Disorder of bursae and tendons in shoulder region 01/05/2014  . Generalized edema 06/22/2016  . Hyperlipidemia 12/16/2015  . Hypertensive heart disease 07/01/2018  . Low back pain 08/17/2013  . Lumbosacral spondylosis 05/06/2013  . Obstructive sleep apnea 07/01/2018  . Persistent atrial fibrillation ([name]) 07/01/2018  . Piriformis syndrome 05/03/2014  . Postlaminectomy syndrome, lumbar region 05/06/2013  . Presbyopia of both eyes 12/16/2015  . Restless legs syndrome 03/23/2016  . Restrictive lung disease 06/22/2016  . Sleep apnea 05/06/2013  . Type 2 diabetes mellitus without complications ([name]) 91/04/3845    Past Surgical History:  Procedure Laterality Date  . APPENDECTOMY    . BACK SURGERY    . CARDIOVERSION N/A 12/01/2018   Procedure: CARDIOVERSION;  Surgeon: Shawn Klein, MD;  Location: [facility];  Service: Cardiovascular;  Laterality: N/A;  . CATARACT EXTRACTION Bilateral   . LUMBAR FUSION    . NECK SURGERY      Family History  Problem Relation Age of Onset  . Atrial fibrillation Mother   . Cancer Mother   . Heart disease Father     Social History  Socioeconomic History  . Marital status: Married    Spouse name: Not on file  . Number of children: Not on file  . Years of education: Not on file  . Highest education level: Not on file  Occupational History  . Not on file  Tobacco Use  . Smoking status: Former Smoker    Quit date: 1993    Years since quitting: 28.5  . Smokeless tobacco: Never Used    Vaping Use  . Vaping Use: Never used  Substance and Sexual Activity  . Alcohol use: Not Currently  . Drug use: Not Currently    Types: Hydrocodone  . Sexual activity: Not on file  Other Topics Concern  . Not on file  Social History Narrative  . Not on file   Social Determinants of Health   Financial Resource Strain:   . Difficulty of Paying Living Expenses:   Food Insecurity:   . Worried About Charity fundraiser in the Last Year:   . Arboriculturist in the Last Year:   Transportation Needs:   . Film/video editor (Medical):   Shawn Sullivan Lack of Transportation (Non-Medical):   Physical Activity:   . Days of Exercise per Week:   . Minutes of Exercise per Session:   Stress:   . Feeling of Stress :   Social Connections:   . Frequency of Communication with Friends and Family:   . Frequency of Social Gatherings with Friends and Family:   . Attends Religious Services:   . Active Member of Clubs or Organizations:   . Attends Archivist Meetings:   Shawn Sullivan Marital Status:   Intimate Partner Violence:   . Fear of Current or Ex-Partner:   . Emotionally Abused:   Shawn Sullivan Physically Abused:   . Sexually Abused:      Current Outpatient Medications:  .  acetaminophen (TYLENOL) 500 MG tablet, Take 1,000 mg by mouth every 6 (six) hours as needed for moderate pain or headache., Disp: , Rfl:  .  amiodarone (PACERONE) 200 MG tablet, Take 200 mg by mouth daily., Disp: , Rfl:  .  apixaban (ELIQUIS) 5 MG TABS tablet, Take 5 mg by mouth 2 (two) times daily. , Disp: , Rfl:  .  BD PEN NEEDLE MICRO U/F 32G X 6 MM MISC, SMARTSIG:1 Needle SUB-Q Daily, Disp: , Rfl:  .  Catheters (FREEDOM CATH MALE EXT CATHETER) MISC, 1 each by Does not apply route daily., Disp: 30 each, Rfl: 3 .  cholecalciferol (VITAMIN D3) 25 MCG (1000 UT) tablet, Take 1,000 Units by mouth daily., Disp: , Rfl:  .  CINNAMON PO, Take 700 mg by mouth daily. , Disp: , Rfl:  .  clotrimazole-betamethasone (LOTRISONE) cream, Apply 1  application topically 2 (two) times daily., Disp: 30 g, Rfl: 2 .  Cyanocobalamin (B-12) 5000 MCG CAPS, Take 5,000 mcg by mouth daily., Disp: , Rfl:  .  Fluticasone-Umeclidin-Vilant (TRELEGY ELLIPTA) 100-62.5-25 MCG/INH AEPB, Inhale 1 puff into the lungs daily., Disp: 1 each, Rfl: 2 .  folic acid (FOLVITE) 324 MCG tablet, Take 800 mcg by mouth daily., Disp: , Rfl:  .  glipiZIDE (GLUCOTROL) 10 MG tablet, Take 1 tablet (10 mg total) by mouth 2 (two) times daily before a meal., Disp: 180 tablet, Rfl: 0 .  HYDROcodone-acetaminophen (NORCO) 10-325 MG tablet, Take 1 tablet by mouth every 4 (four) hours as needed for moderate pain. , Disp: , Rfl:  .  Insulin Glargine-Lixisenatide (SOLIQUA) 100-33 UNT-MCG/ML SOPN, Inject 40 Units into  the skin daily. , Disp: , Rfl:  .  ipratropium-albuterol (DUONEB) 0.5-2.5 (3) MG/3ML SOLN, Take 3 mLs by nebulization every 6 (six) hours as needed (shortness of breath). , Disp: , Rfl:  .  levocetirizine (XYZAL) 5 MG tablet, Take 5 mg by mouth daily as needed for allergies., Disp: , Rfl:  .  magnesium chloride (SLOW-MAG) 64 MG TBEC SR tablet, Take 1 tablet (64 mg total) by mouth 2 (two) times daily., Disp: 60 tablet, Rfl: 3 .  metolazone (ZAROXOLYN) 5 MG tablet, Take 5 mg by mouth once a week., Disp: , Rfl:  .  metoprolol tartrate (LOPRESSOR) 50 MG tablet, Take 1 tablet (50 mg total) by mouth every 6 (six) hours. (Patient taking differently: Take 50 mg by mouth in the morning, at noon, and at bedtime. ), Disp: 360 tablet, Rfl: 2 .  montelukast (SINGULAIR) 10 MG tablet, Take 1 tablet (10 mg total) by mouth every evening., Disp: 90 tablet, Rfl: 3 .  Omega-3 Fatty Acids (OMEGA-3 FISH OIL CONCENTRATE) 1000 MG CPDR, Take 2,000 mg by mouth 2 (two) times a day., Disp: , Rfl:  .  omeprazole (PRILOSEC) 20 MG capsule, Take 20 mg by mouth 2 (two) times daily before a meal. , Disp: , Rfl:  .  potassium chloride SA (K-DUR) 20 MEQ tablet, Take 20 mEq by mouth 2 (two) times daily., Disp: ,  Rfl:  .  pyridOXINE (VITAMIN B-6) 100 MG tablet, Take 100 mg by mouth daily., Disp: , Rfl:  .  rOPINIRole (REQUIP) 0.5 MG tablet, Take 1 tablet (0.5 mg total) by mouth at bedtime., Disp: 30 tablet, Rfl: 2 .  rosuvastatin (CRESTOR) 20 MG tablet, Take 1 tablet (20 mg total) by mouth daily., Disp: 90 tablet, Rfl: 1 .  sertraline (ZOLOFT) 50 MG tablet, Take 1 tablet by mouth twice daily, Disp: 180 tablet, Rfl: 0 .  tamsulosin (FLOMAX) 0.4 MG CAPS capsule, TAKE 1 CAPSULE BY MOUTH BEFORE BEDTIME, Disp: 90 capsule, Rfl: 0 .  torsemide (DEMADEX) 20 MG tablet, Take 1 tablet (20 mg total) by mouth 2 (two) times daily. Take an extra tablet (20 mg) in the afternoon on Monday, Wednesday, Friday. (Patient taking differently: Take 40 mg by mouth 2 (two) times daily. ), Disp: 72 tablet, Rfl: 3 .  VENTOLIN HFA 108 (90 Base) MCG/ACT inhaler, Inhale 2 puffs into the lungs every 4 (four) hours as needed for wheezing or shortness of breath., Disp: 18 g, Rfl: 11   Allergies  Allergen Reactions  . Androgel [Testosterone] Other (See Comments)    Pedal edema   . Biaxin [Clarithromycin] Other (See Comments)    Taste perception alteration  . Duloxetine Other (See Comments)    Hallucinations  . Gabapentin Other (See Comments)    hallucinations  . Ibuprofen Other (See Comments)    GI upset   . Lyrica [Pregabalin] Swelling  . Spironolactone Other (See Comments)    ROS CONSTITUTIONAL: Negative for chills, fatigue, fever, unintentional weight gain and unintentional weight loss.  E/N/T: Negative for ear pain, nasal congestion and sore throat.  CARDIOVASCULAR: Negative for chest pain, dizziness, palpitations and pedal edema.  RESPIRATORY: Negative for recent cough and dyspnea.  GASTROINTESTINAL: Negative for abdominal pain, acid reflux symptoms, constipation, diarrhea, nausea and vomiting.  MSK: Negative for arthralgias and myalgias.  INTEGUMENTARY: Negative for rash.  NEUROLOGICAL: Negative for dizziness and  headaches.  PSYCHIATRIC: see HPI       Objective:    PHYSICAL EXAM:   VS: BP 132/70  Pulse 72   Temp 97.6 F (36.4 C)   Resp 18   Ht 6' (1.829 m)   Wt (!) 342 lb (155.1 kg)   BMI 46.38 kg/m   GEN: Well nourished, well developed, in no acute distress  or pharynx Neck: no JVD or masses - no thyromegaly Cardiac: RRR; no murmurs, rubs, or gallops, has 1+ bilateral edema - no significant varicosities Respiratory:  normal respiratory rate and pattern with no distress - normal breath sounds with no rales, rhonchi, wheezes or rubs  MS: no deformity or atrophy  Skin: warm and dry, no rash  Neuro:  Alert and Oriented x 3, Strength and sensation are intact - CN II-Xii grossly intact Psych: euthymic mood, appropriate affect and demeanor Diabetic Foot Exam - Simple   Simple Foot Form Diabetic Foot exam was performed with the following findings: Yes 05/31/2020 11:04 AM  Visual Inspection No deformities, no ulcerations, no other skin breakdown bilaterally: Yes Sensation Testing See comments: Yes Pulse Check See comments: Yes Comments Decreased pulses  - decreased sensation both feet     BP 132/70   Pulse 72   Temp 97.6 F (36.4 C)   Resp 18   Ht 6' (1.829 m)   Wt (!) 342 lb (155.1 kg)   BMI 46.38 kg/m  Wt Readings from Last 3 Encounters:  05/31/20 (!) 342 lb (155.1 kg)  04/11/20 (!) 347 lb (157.4 kg)  02/23/20 (!) 346 lb (156.9 kg)   Office Visit on 05/31/2020  Component Date Value Ref Range Status  . Microalbumin Ur, POC 05/31/2020 150  mg/L Final     Health Maintenance Due  Topic Date Due  . Hepatitis C Screening  Never done  . OPHTHALMOLOGY EXAM  Never done  . TETANUS/TDAP  Never done    There are no preventive care reminders to display for this patient.  No results found for: TSH Lab Results  Component Value Date   WBC 8.7 03/19/2020   HGB 15.5 03/19/2020   HCT 46.9 03/19/2020   MCV 94 03/19/2020   PLT 160 03/19/2020   Lab Results  Component Value  Date   NA 140 03/19/2020   K 4.1 03/19/2020   CO2 30 (H) 03/19/2020   GLUCOSE 367 (H) 03/19/2020   BUN 20 03/19/2020   CREATININE 1.65 (H) 03/19/2020   BILITOT 1.0 03/19/2020   ALKPHOS 129 (H) 03/19/2020   AST 44 (H) 03/19/2020   ALT 36 03/19/2020   PROT 6.6 03/19/2020   ALBUMIN 4.3 03/19/2020   CALCIUM 10.1 03/19/2020   Lab Results  Component Value Date   CHOL 139 02/23/2020   Lab Results  Component Value Date   HDL 36 (L) 02/23/2020   Lab Results  Component Value Date   LDLCALC 76 02/23/2020   Lab Results  Component Value Date   TRIG 158 (H) 02/23/2020   Lab Results  Component Value Date   CHOLHDL 3.9 02/23/2020   Lab Results  Component Value Date   HGBA1C 9.0 (H) 02/23/2020      Assessment & Plan:   Problem List Items Addressed This Visit      Cardiovascular and Mediastinum   Hypertensive heart and kidney disease with chronic diastolic congestive heart failure and stage 3 chronic kidney disease ([place]) - Primary    Well controlled.  No changes to medicines.  Continue to work on eating a healthy diet and exercise.  Labs drawn today.        Relevant Medications   metolazone (  ZAROXOLYN) 5 MG tablet   Other Relevant Orders   CBC with Differential/Platelet   Comprehensive metabolic panel   Atrial fibrillation, chronic (HCC)    Continue meds and follow up with cardiology as directed      Relevant Medications   metolazone (ZAROXOLYN) 5 MG tablet     Respiratory   Chronic obstructive pulmonary disease ([place])    Continue current meds and follow up wit pulmonology as directed        Endocrine   Type 2 diabetes mellitus without complications ([place])     No changes to medicines.  Continue to work on eating a healthy diet and exercise.  Labs drawn today.        Relevant Orders   Hemoglobin A1c   POCT UA - Microalbumin (Completed)     Other   Hyperlipidemia    Well controlled.  No changes to medicines.  Continue to work on eating a healthy diet  and exercise.  Labs drawn today.        Relevant Medications   metolazone (ZAROXOLYN) 5 MG tablet   Other Relevant Orders   Lipid panel   Anxiety    Trial of requip for leg pain/anxiety at night         Meds ordered this encounter  Medications  . rOPINIRole (REQUIP) 0.5 MG tablet    Sig: Take 1 tablet (0.5 mg total) by mouth at bedtime.    Dispense:  30 tablet    Refill:  2    Order Specific Question:   Supervising Provider    AnswerShelton Silvas    Follow-up: Return in about 3 months (around 08/31/2020) for fasting with Dr Tobie Poet.    SARA R Donyae Kohn, PA-C

## 2020-05-31 NOTE — Assessment & Plan Note (Signed)
Continue meds and follow up with cardiology as directed ?

## 2020-05-31 NOTE — Assessment & Plan Note (Signed)
Trial of requip for leg pain/anxiety at night

## 2020-05-31 NOTE — Assessment & Plan Note (Deleted)
Trial trazadone qhs

## 2020-05-31 NOTE — Assessment & Plan Note (Signed)
Continue current meds and follow up wit pulmonology as directed

## 2020-05-31 NOTE — Assessment & Plan Note (Signed)
°  No changes to medicines.  °Continue to work on eating a healthy diet and exercise.  °Labs drawn today.  ° °

## 2020-06-05 LAB — CBC WITH DIFFERENTIAL/PLATELET
Basophils Absolute: 0.1 10*3/uL (ref 0.0–0.2)
Basos: 1 %
EOS (ABSOLUTE): 0.3 10*3/uL (ref 0.0–0.4)
Eos: 3 %
Hematocrit: 46.1 % (ref 37.5–51.0)
Hemoglobin: 15 g/dL (ref 13.0–17.7)
Immature Grans (Abs): 0 10*3/uL (ref 0.0–0.1)
Immature Granulocytes: 0 %
Lymphocytes Absolute: 3.5 10*3/uL — ABNORMAL HIGH (ref 0.7–3.1)
Lymphs: 32 %
MCH: 29.4 pg (ref 26.6–33.0)
MCHC: 32.5 g/dL (ref 31.5–35.7)
MCV: 90 fL (ref 79–97)
Monocytes Absolute: 0.6 10*3/uL (ref 0.1–0.9)
Monocytes: 6 %
Neutrophils Absolute: 6.5 10*3/uL (ref 1.4–7.0)
Neutrophils: 58 %
Platelets: 211 10*3/uL (ref 150–450)
RBC: 5.1 x10E6/uL (ref 4.14–5.80)
RDW: 12.7 % (ref 11.6–15.4)
WBC: 10.9 10*3/uL — ABNORMAL HIGH (ref 3.4–10.8)

## 2020-06-05 LAB — HEMOGLOBIN A1C
Est. average glucose Bld gHb Est-mCnc: 298 mg/dL
Hgb A1c MFr Bld: 12 % — ABNORMAL HIGH (ref 4.8–5.6)

## 2020-06-05 LAB — LIPID PANEL
Chol/HDL Ratio: 4.3 ratio (ref 0.0–5.0)
Cholesterol, Total: 153 mg/dL (ref 100–199)
HDL: 36 mg/dL — ABNORMAL LOW (ref >39)
LDL Chol Calc (NIH): 87 mg/dL (ref 0–99)
Triglycerides: 173 mg/dL — ABNORMAL HIGH (ref 0–149)
VLDL Cholesterol Cal: 30 mg/dL (ref 5–40)

## 2020-06-05 LAB — CARDIOVASCULAR RISK ASSESSMENT

## 2020-06-06 ENCOUNTER — Other Ambulatory Visit: Payer: Self-pay

## 2020-06-06 DIAGNOSIS — I130 Hypertensive heart and chronic kidney disease with heart failure and stage 1 through stage 4 chronic kidney disease, or unspecified chronic kidney disease: Secondary | ICD-10-CM

## 2020-06-06 DIAGNOSIS — N183 Chronic kidney disease, stage 3 unspecified: Secondary | ICD-10-CM

## 2020-06-06 LAB — COMPREHENSIVE METABOLIC PANEL
ALT: 22 IU/L (ref 0–44)
AST: 33 IU/L (ref 0–40)
Albumin/Globulin Ratio: 1.4 (ref 1.2–2.2)
Albumin: 3.6 g/dL — ABNORMAL LOW (ref 3.7–4.7)
Alkaline Phosphatase: 140 IU/L — ABNORMAL HIGH (ref 48–121)
BUN/Creatinine Ratio: 13 (ref 10–24)
BUN: 17 mg/dL (ref 8–27)
Bilirubin Total: 0.2 mg/dL (ref 0.0–1.2)
CO2: 24 mmol/L (ref 20–29)
Calcium: 10 mg/dL (ref 8.6–10.2)
Chloride: 101 mmol/L (ref 96–106)
Creatinine, Ser: 1.32 mg/dL — ABNORMAL HIGH (ref 0.76–1.27)
GFR calc Af Amer: 60 mL/min/{1.73_m2} (ref >59)
GFR calc non Af Amer: 52 mL/min/{1.73_m2} — ABNORMAL LOW (ref >59)
Globulin, Total: 2.6 g/dL (ref 1.5–4.5)
Glucose: 98 mg/dL (ref 65–99)
Potassium: 4.8 mmol/L (ref 3.5–5.2)
Sodium: 140 mmol/L (ref 134–144)
Total Protein: 6.2 g/dL (ref 6.0–8.5)

## 2020-06-06 LAB — SPECIMEN STATUS REPORT

## 2020-06-06 NOTE — Telephone Encounter (Signed)
Marge Duncans, PA reviewed CMP from 05/31/2020. Creatinine elevated but improve, otherwise stable. Patient was informed.

## 2020-06-07 ENCOUNTER — Telehealth: Payer: Self-pay | Admitting: Family Medicine

## 2020-06-07 NOTE — Progress Notes (Signed)
°  Chronic Care Management   Outreach Note  06/07/2020 Name: Shawn Sullivan MRN: [id number] DOB: September 24, 1946  Referred by: Rochel Brome, MD Reason for referral : No chief complaint on file.   An unsuccessful telephone outreach was attempted today. The patient was referred to the pharmacist for assistance with care management and care coordination.   Follow Up Plan:   Earney Hamburg Upstream Scheduler

## 2020-06-17 ENCOUNTER — Telehealth: Payer: Self-pay | Admitting: Family Medicine

## 2020-06-17 ENCOUNTER — Other Ambulatory Visit: Payer: Self-pay | Admitting: Family Medicine

## 2020-06-17 NOTE — Progress Notes (Signed)
  Chronic Care Management   Outreach Note  06/17/2020 Name: Shawn Sullivan MRN: [id number] DOB: 1946-08-08  Referred by: Rochel Brome, MD Reason for referral : No chief complaint on file.   An unsuccessful telephone outreach was attempted today. The patient was referred to the pharmacist for assistance with care management and care coordination.   Follow Up Plan:   Earney Hamburg Upstream Scheduler

## 2020-06-19 ENCOUNTER — Telehealth: Payer: Self-pay | Admitting: Family Medicine

## 2020-06-19 NOTE — Progress Notes (Signed)
°  Chronic Care Management   Note  06/19/2020 Name: Shawn Sullivan MRN: [id number] DOB: August 23, 1946  Shawn Sullivan is a 74 y.o. year old male who is a primary care patient of Cox, Kirsten, MD. I reached out to [employer] by phone today in response to a referral sent by Shawn Sullivan's PCP, Cox, Kirsten, MD.   Mr. [name] was given information about Chronic Care Management services today including:  1. CCM service includes personalized support from designated clinical staff supervised by his physician, including individualized plan of care and coordination with other care providers 2. 24/7 contact phone numbers for assistance for urgent and routine care needs. 3. Service will only be billed when office clinical staff spend 20 minutes or more in a month to coordinate care. 4. Only one practitioner may furnish and bill the service in a calendar month. 5. The patient may stop CCM services at any time (effective at the end of the month) by phone call to the office staff.   Patient agreed to services and verbal consent obtained.   Follow up plan:   Earney Hamburg Upstream Scheduler

## 2020-06-19 NOTE — Progress Notes (Signed)
°  Chronic Care Management   Note  06/19/2020 Name: Shawn Sullivan MRN: [id number] DOB: [birth year]  Shawn Sullivan is a 74 y.o. year old male who is a primary care patient of Cox, Kirsten, MD. I reached out to [employer] by phone today in response to a referral sent by Shawn Sullivan's PCP, Cox, Kirsten, MD.   Shawn Sullivan was given information about Chronic Care Management services today including:  1. CCM service includes personalized support from designated clinical staff supervised by his physician, including individualized plan of care and coordination with other care providers 2. 24/7 contact phone numbers for assistance for urgent and routine care needs. 3. Service will only be billed when office clinical staff spend 20 minutes or more in a month to coordinate care. 4. Only one practitioner may furnish and bill the service in a calendar month. 5. The patient may stop CCM services at any time (effective at the end of the month) by phone call to the office staff.   Patient agreed to services and verbal consent obtained.   Follow up plan:   Earney Hamburg Upstream Scheduler

## 2020-06-28 ENCOUNTER — Ambulatory Visit (INDEPENDENT_AMBULATORY_CARE_PROVIDER_SITE_OTHER): Payer: Medicare HMO | Admitting: Cardiology

## 2020-06-28 ENCOUNTER — Encounter: Payer: Self-pay | Admitting: Cardiology

## 2020-06-28 ENCOUNTER — Other Ambulatory Visit: Payer: Self-pay

## 2020-06-28 VITALS — BP 138/72 | HR 77 | Ht 72.0 in | Wt 342.4 lb

## 2020-06-28 DIAGNOSIS — N183 Chronic kidney disease, stage 3 unspecified: Secondary | ICD-10-CM

## 2020-06-28 DIAGNOSIS — I130 Hypertensive heart and chronic kidney disease with heart failure and stage 1 through stage 4 chronic kidney disease, or unspecified chronic kidney disease: Secondary | ICD-10-CM

## 2020-06-28 DIAGNOSIS — I480 Paroxysmal atrial fibrillation: Secondary | ICD-10-CM

## 2020-06-28 DIAGNOSIS — I350 Nonrheumatic aortic (valve) stenosis: Secondary | ICD-10-CM | POA: Diagnosis not present

## 2020-06-28 DIAGNOSIS — Z79899 Other long term (current) drug therapy: Secondary | ICD-10-CM

## 2020-06-28 DIAGNOSIS — I5032 Chronic diastolic (congestive) heart failure: Secondary | ICD-10-CM

## 2020-06-28 DIAGNOSIS — I4510 Unspecified right bundle-branch block: Secondary | ICD-10-CM

## 2020-06-28 NOTE — Patient Instructions (Signed)
Medication Instructions:  Your physician recommends that you continue on your current medications as directed. Please refer to the Current Medication list given to you today.  *If you need a refill on your cardiac medications before your next appointment, please call your pharmacy*   Lab Work: None If you have labs (blood work) drawn today and your tests are completely normal, you will receive your results only by: [name] MyChart Message (if you have MyChart) OR . A paper copy in the mail If you have any lab test that is abnormal or we need to change your treatment, we will call you to review the results.   Testing/Procedures: Your physician has requested that you have an echocardiogram in 3 months. Echocardiography is a painless test that uses sound waves to create images of your heart. It provides your doctor with information about the size and shape of your heart and how well your heart's chambers and valves are working. This procedure takes approximately one hour. There are no restrictions for this procedure.     Follow-Up: At [facility], you and your health needs are our priority.  As part of our continuing mission to provide you with exceptional heart care, we have created designated Provider Care Teams.  These Care Teams include your primary Cardiologist (physician) and Advanced Practice Providers (APPs -  Physician Assistants and Nurse Practitioners) who all work together to provide you with the care you need, when you need it.  We recommend signing up for the patient portal called "MyChart".  Sign up information is provided on this After Visit Summary.  MyChart is used to connect with patients for Virtual Visits (Telemedicine).  Patients are able to view lab/test results, encounter notes, upcoming appointments, etc.  Non-urgent messages can be sent to your provider as well.   To learn more about what you can do with MyChart, go to [email or web address].    Your next appointment:    6 month(s)  The format for your next appointment:   In Person  Provider:   Shirlee More, MD   Other Instructions

## 2020-06-28 NOTE — Progress Notes (Signed)
Cardiology Office Note:    Date:  06/28/2020   ID:  Shawn Sullivan, DOB Sep 20, 1946, MRN [id number]  PCP:  Rochel Brome, MD    please do TSH T3-T4 and a proBNP level with your next labs Cardiologist:  Shirlee More, MD    Referring MD: Rochel Brome, MD    ASSESSMENT:    1. PAF (paroxysmal atrial fibrillation) ([place])   2. On amiodarone therapy   3. Chronic anticoagulation   4. Hypertensive heart and kidney disease with chronic diastolic congestive heart failure and stage 3 chronic kidney disease, unspecified whether stage 3a or 3b CKD ([place])   5. Nonrheumatic aortic valve stenosis   6. Right bundle branch block    PLAN:    In order of problems listed above:  1. Stable maintain sinus rhythm continue low-dose amiodarone recent CMP mild elevation of alkaline phosphatase otherwise normal LFTs 2. Target continue current treatment with his diuretics beta-blocker.  Heart failure compensated.  I will ask his PCP next visit to check full thyroids and a proBNP level 3. Recheck echocardiogram this October 2 years 4. Stable EKG pattern   Next appointment: 6 months   Medication Adjustments/Labs and Tests Ordered: Current medicines are reviewed at length with the patient today.  Concerns regarding medicines are outlined above.  No orders of the defined types were placed in this encounter.  No orders of the defined types were placed in this encounter.   No chief complaint on file.   History of Present Illness:    Shawn Sullivan is a 74 y.o. male with a hx of  persistent atrial fibrillation, hypertension, chronic kidney disease 3, COPD, and obstructive sleep apnea last seen 06/14/2019.  He has been on amiodarone since January 2020 and was cardioverted to sinus rhythm 12/01/2018.    He also has a history of mild aortic stenosis and right bundle branch block He was last seen 12/04/2019. Compliance with diet, lifestyle and medications: Yes  In general he is done well he had 1 day  with 1 refill atrial fibrillation his daughter use the iPhone adapter he did not.  He is troubled with shortness of breath walking longer distance attributes it to his body weight and COPD no edema orthopnea palpitation or syncope otherwise tolerates his amiodarone without side effects not anticoagulated Past Medical History:  Diagnosis Date   Anticoagulated 07/01/2018   Anticoagulated 07/01/2018   Benign essential hypertension 05/06/2013   Chronic obstructive pulmonary disease ([place]) 11/05/2015   CKD (chronic kidney disease) stage 2, GFR 60-89 ml/min 07/01/2018   COPD (chronic obstructive pulmonary disease) ([place]) 07/01/2018   Dermatochalasis 12/16/2015   Disorder of bursae and tendons in shoulder region 01/05/2014   Generalized edema 06/22/2016   Hyperlipidemia 12/16/2015   Hypertensive heart disease 07/01/2018   Low back pain 08/17/2013   Lumbosacral spondylosis 05/06/2013   Obstructive sleep apnea 07/01/2018   Persistent atrial fibrillation ([place]) 07/01/2018   Piriformis syndrome 05/03/2014   Postlaminectomy syndrome, lumbar region 05/06/2013   Presbyopia of both eyes 12/16/2015   Restless legs syndrome 03/23/2016   Restrictive lung disease 06/22/2016   Sleep apnea 05/06/2013   Type 2 diabetes mellitus without complications ([place]) 96/01/2296    Past Surgical History:  Procedure Laterality Date   APPENDECTOMY     BACK SURGERY     CARDIOVERSION N/A 12/01/2018   Procedure: CARDIOVERSION;  Surgeon: Sanda Klein, MD;  Location: [facility];  Service: Cardiovascular;  Laterality: N/A;   CATARACT EXTRACTION Bilateral    LUMBAR FUSION  NECK SURGERY      Current Medications: Current Meds  Medication Sig   acetaminophen (TYLENOL) 500 MG tablet Take 1,000 mg by mouth every 6 (six) hours as needed for moderate pain or headache.   amiodarone (PACERONE) 200 MG tablet Take 200 mg by mouth daily.   apixaban (ELIQUIS) 5 MG TABS tablet Take 5 mg by mouth 2 (two) times daily.    BD PEN  NEEDLE MICRO U/F 32G X 6 MM MISC SMARTSIG:1 Needle SUB-Q Daily   Catheters (FREEDOM CATH MALE EXT CATHETER) MISC 1 each by Does not apply route daily.   cholecalciferol (VITAMIN D3) 25 MCG (1000 UT) tablet Take 1,000 Units by mouth daily.   CINNAMON PO Take 700 mg by mouth daily.    Cyanocobalamin (B-12) 5000 MCG CAPS Take 5,000 mcg by mouth daily.   Fluticasone-Umeclidin-Vilant (TRELEGY ELLIPTA) 100-62.5-25 MCG/INH AEPB Inhale 1 puff into the lungs daily.   folic acid (FOLVITE) 562 MCG tablet Take 800 mcg by mouth daily.   glipiZIDE (GLUCOTROL) 10 MG tablet TAKE 1 TABLET BY MOUTH TWICE DAILY BEFORE A MEAL   HYDROcodone-acetaminophen (NORCO) 10-325 MG tablet Take 1 tablet by mouth every 4 (four) hours as needed for moderate pain.    Insulin Glargine-Lixisenatide (SOLIQUA) 100-33 UNT-MCG/ML SOPN Inject 50 Units into the skin daily.    ipratropium-albuterol (DUONEB) 0.5-2.5 (3) MG/3ML SOLN Take 3 mLs by nebulization every 6 (six) hours as needed (shortness of breath).    levocetirizine (XYZAL) 5 MG tablet Take 5 mg by mouth daily as needed for allergies.   magnesium chloride (SLOW-MAG) 64 MG TBEC SR tablet Take 1 tablet (64 mg total) by mouth 2 (two) times daily.   metolazone (ZAROXOLYN) 5 MG tablet Take 5 mg by mouth once a week.   metoprolol tartrate (LOPRESSOR) 50 MG tablet Take 1 tablet (50 mg total) by mouth every 6 (six) hours. (Patient taking differently: Take 50 mg by mouth in the morning, at noon, and at bedtime. )   montelukast (SINGULAIR) 10 MG tablet Take 1 tablet (10 mg total) by mouth every evening.   Omega-3 Fatty Acids (OMEGA-3 FISH OIL CONCENTRATE) 1000 MG CPDR Take 2,000 mg by mouth 2 (two) times a day.   omeprazole (PRILOSEC) 20 MG capsule Take 20 mg by mouth 2 (two) times daily before a meal.    potassium chloride SA (K-DUR) 20 MEQ tablet Take 20 mEq by mouth 2 (two) times daily.   pyridOXINE (VITAMIN B-6) 100 MG tablet Take 100 mg by mouth daily.    rOPINIRole (REQUIP) 0.5 MG tablet Take 1 tablet (0.5 mg total) by mouth at bedtime. (Patient taking differently: Take 0.5 mg by mouth at bedtime. )   rosuvastatin (CRESTOR) 20 MG tablet Take 1 tablet (20 mg total) by mouth daily.   sertraline (ZOLOFT) 50 MG tablet Take 1 tablet by mouth twice daily   tamsulosin (FLOMAX) 0.4 MG CAPS capsule TAKE 1 CAPSULE BY MOUTH BEFORE BEDTIME   torsemide (DEMADEX) 20 MG tablet Take 1 tablet (20 mg total) by mouth 2 (two) times daily. Take an extra tablet (20 mg) in the afternoon on Monday, Wednesday, Friday. (Patient taking differently: Take 40 mg by mouth 2 (two) times daily. )   VENTOLIN HFA 108 (90 Base) MCG/ACT inhaler Inhale 2 puffs into the lungs every 4 (four) hours as needed for wheezing or shortness of breath.     Allergies:   Androgel [testosterone], Biaxin [clarithromycin], Duloxetine, Gabapentin, Ibuprofen, Lyrica [pregabalin], and Spironolactone   Social History  Socioeconomic History   Marital status: Married    Spouse name: Not on file   Number of children: Not on file   Years of education: Not on file   Highest education level: Not on file  Occupational History   Not on file  Tobacco Use   Smoking status: Former Smoker    Quit date: 1993    Years since quitting: 28.5   Smokeless tobacco: Never Used  Scientific laboratory technician Use: Never used  Substance and Sexual Activity   Alcohol use: Not Currently   Drug use: Not Currently    Types: Hydrocodone   Sexual activity: Not on file  Other Topics Concern   Not on file  Social History Narrative   Not on file   Social Determinants of Health   Financial Resource Strain:    Difficulty of Paying Living Expenses:   Food Insecurity:    Worried About Charity fundraiser in the Last Year:    Arboriculturist in the Last Year:   Transportation Needs:    Film/video editor (Medical):    Lack of Transportation (Non-Medical):   Physical Activity:    Days of  Exercise per Week:    Minutes of Exercise per Session:   Stress:    Feeling of Stress :   Social Connections:    Frequency of Communication with Friends and Family:    Frequency of Social Gatherings with Friends and Family:    Attends Religious Services:    Active Member of Clubs or Organizations:    Attends Archivist Meetings:    Marital Status:      Family History: The patient's family history includes Atrial fibrillation in his mother; Cancer in his mother; Heart disease in his father. ROS:   Please see the history of present illness.    All other systems reviewed and are negative.  EKGs/Labs/Other Studies Reviewed:    The following studies were reviewed today:  EKG:  EKG ordered today and personally reviewed.  The ekg ordered today demonstrates sinus rhythm stable pattern right bundle branch block  Recent Labs: 05/31/2020: ALT 22; BUN 17; Creatinine, Ser 1.32; Hemoglobin 15.0; Platelets 211; Potassium 4.8; Sodium 140  Recent Lipid Panel    Component Value Date/Time   CHOL 153 05/31/2020 1048   TRIG 173 (H) 05/31/2020 1048   HDL 36 (L) 05/31/2020 1048   CHOLHDL 4.3 05/31/2020 1048   LDLCALC 87 05/31/2020 1048    Physical Exam:    VS:  BP (!) 138/72 (BP Location: Right Arm, Patient Position: Sitting, Cuff Size: Normal)    Pulse 77    Ht 6' (1.829 m)    Wt (!) 342 lb 6.4 oz (155.3 kg)    SpO2 94%    BMI 46.44 kg/m     Wt Readings from Last 3 Encounters:  06/28/20 (!) 342 lb 6.4 oz (155.3 kg)  05/31/20 (!) 342 lb (155.1 kg)  04/11/20 (!) 347 lb (157.4 kg)     GEN: Quite obese BMI greater than 45 well nourished, well developed in no acute distress HEENT: Normal NECK: No JVD; No carotid bruits LYMPHATICS: No lymphadenopathy CARDIAC: Distant RRR, no murmurs, rubs, gallops RESPIRATORY:  Clear to auscultation without rales, wheezing or rhonchi  ABDOMEN: Soft, non-tender, non-distended MUSCULOSKELETAL:  No edema; No deformity  SKIN: Warm and  dry NEUROLOGIC:  Alert and oriented x 3 PSYCHIATRIC:  Normal affect    Signed, Shirlee More, MD  06/28/2020 2:37 PM  [facility]

## 2020-07-01 ENCOUNTER — Other Ambulatory Visit: Payer: Self-pay | Admitting: Family Medicine

## 2020-07-01 ENCOUNTER — Other Ambulatory Visit: Payer: Self-pay

## 2020-07-01 MED ORDER — AMIODARONE HCL 200 MG PO TABS: 200.0000 mg | ORAL_TABLET | Freq: Every day | ORAL | 1 refills | Status: DC

## 2020-07-01 NOTE — Progress Notes (Signed)
Received faxed refill request for Amiodarone 200 MG. Sent to pharmacy

## 2020-07-02 NOTE — Chronic Care Management (AMB) (Deleted)
Chronic Care Management Pharmacy  Name: Shawn Sullivan  MRN: [id number] DOB: [birth year]  Chief Complaint/ HPI  Shawn Sullivan,  74 y.o. , male presents for their Initial CCM visit with the clinical pharmacist {CHL HP Upstream Pharm visit VANV:[id number]}.  PCP : Rochel Brome, MD  Their chronic conditions include: afib, hypertensive heart and kidney disease, chronic diastolic congestive heart failure, ckd stage 3, COPD, OSA, GERD, Type 2 DM, restless legs syndrome, polyneuropathy, hyperlipidemia, depressive disorder, chronic pain, anxiety.   Office Visits:*** 05/31/2020 - trial of ropinirole for leg pain/anxiety at night.  04/11/2020 - balanitis with urinary incontinence. Ordered condom catheter, fluconazole 100 mg, clotrimazole.  02/23/2020 - continue current medications.  Consult Visit:*** 06/28/2020 - Cardiology visit - stable rhythm continue amiodarone. Heart failure compensated. Needs full thyroid panel and proBNP level at next labs.  05/15/2020 - Pain management - telemed visit. Refill opioid medications.  02/13/2020 - Pain management - telemed visit. Refill opioid medications.   Medications: Outpatient Encounter Medications as of 07/03/2020  Medication Sig  . acetaminophen (TYLENOL) 500 MG tablet Take 1,000 mg by mouth every 6 (six) hours as needed for moderate pain or headache.  [name] amiodarone (PACERONE) 200 MG tablet Take 1 tablet (200 mg total) by mouth daily.  [name] apixaban (ELIQUIS) 5 MG TABS tablet Take 5 mg by mouth 2 (two) times daily.   . BD PEN NEEDLE MICRO U/F 32G X 6 MM MISC SMARTSIG:1 Needle SUB-Q Daily  . Catheters (FREEDOM CATH MALE EXT CATHETER) MISC 1 each by Does not apply route daily.  . cholecalciferol (VITAMIN D3) 25 MCG (1000 UT) tablet Take 1,000 Units by mouth daily.  [name] CINNAMON PO Take 700 mg by mouth daily.   . Cyanocobalamin (B-12) 5000 MCG CAPS Take 5,000 mcg by mouth daily.  . Fluticasone-Umeclidin-Vilant (TRELEGY ELLIPTA) 100-62.5-25 MCG/INH  AEPB Inhale 1 puff into the lungs daily.  . folic acid (FOLVITE) 997 MCG tablet Take 800 mcg by mouth daily.  [name] glipiZIDE (GLUCOTROL) 10 MG tablet TAKE 1 TABLET BY MOUTH TWICE DAILY BEFORE A MEAL  . HYDROcodone-acetaminophen (NORCO) 10-325 MG tablet Take 1 tablet by mouth every 4 (four) hours as needed for moderate pain.   . Insulin Glargine-Lixisenatide (SOLIQUA) 100-33 UNT-MCG/ML SOPN Inject 50 Units into the skin daily.   [name] ipratropium-albuterol (DUONEB) 0.5-2.5 (3) MG/3ML SOLN Take 3 mLs by nebulization every 6 (six) hours as needed (shortness of breath).   [name] levocetirizine (XYZAL) 5 MG tablet Take 5 mg by mouth daily as needed for allergies.  . magnesium chloride (SLOW-MAG) 64 MG TBEC SR tablet Take 1 tablet (64 mg total) by mouth 2 (two) times daily.  . metolazone (ZAROXOLYN) 5 MG tablet Take 5 mg by mouth once a week.  . metoprolol tartrate (LOPRESSOR) 50 MG tablet Take 1 tablet (50 mg total) by mouth every 6 (six) hours. (Patient taking differently: Take 50 mg by mouth in the morning, at noon, and at bedtime. )  . montelukast (SINGULAIR) 10 MG tablet Take 1 tablet (10 mg total) by mouth every evening.  . Omega-3 Fatty Acids (OMEGA-3 FISH OIL CONCENTRATE) 1000 MG CPDR Take 2,000 mg by mouth 2 (two) times a day.  [name] omeprazole (PRILOSEC) 20 MG capsule Take 20 mg by mouth 2 (two) times daily before a meal.   . potassium chloride SA (K-DUR) 20 MEQ tablet Take 20 mEq by mouth 2 (two) times daily.  [name] pyridOXINE (VITAMIN B-6) 100 MG tablet Take 100 mg by mouth daily.  [name]  rOPINIRole (REQUIP) 0.5 MG tablet Take 1 tablet (0.5 mg total) by mouth at bedtime. (Patient taking differently: Take 0.5 mg by mouth at bedtime. )  . rosuvastatin (CRESTOR) 20 MG tablet Take 1 tablet (20 mg total) by mouth daily.  . sertraline (ZOLOFT) 50 MG tablet Take 1 tablet by mouth twice daily  . tamsulosin (FLOMAX) 0.4 MG CAPS capsule TAKE 1 CAPSULE BY MOUTH BEFORE BEDTIME  . torsemide (DEMADEX) 20 MG tablet Take 1 tablet  (20 mg total) by mouth 2 (two) times daily. Take an extra tablet (20 mg) in the afternoon on Monday, Wednesday, Friday. (Patient taking differently: Take 40 mg by mouth 2 (two) times daily. )  . VENTOLIN HFA 108 (90 Base) MCG/ACT inhaler Inhale 2 puffs into the lungs every 4 (four) hours as needed for wheezing or shortness of breath.  . [DISCONTINUED] amiodarone (PACERONE) 200 MG tablet Take 200 mg by mouth daily.  . [DISCONTINUED] sertraline (ZOLOFT) 50 MG tablet Take 1 tablet by mouth twice daily  . [DISCONTINUED] tamsulosin (FLOMAX) 0.4 MG CAPS capsule TAKE 1 CAPSULE BY MOUTH BEFORE BEDTIME   No facility-administered encounter medications on file as of 07/03/2020.   Allergies  Allergen Reactions  . Androgel [Testosterone] Other (See Comments)    Pedal edema   . Biaxin [Clarithromycin] Other (See Comments)    Taste perception alteration  . Duloxetine Other (See Comments)    Hallucinations  . Gabapentin Other (See Comments)    hallucinations  . Ibuprofen Other (See Comments)    GI upset   . Lyrica [Pregabalin] Swelling  . Spironolactone Other (See Comments)   SDOH Screenings   Alcohol Screen:   . Last Alcohol Screening Score (AUDIT):   Depression (PHQ2-9):   . PHQ-2 Score:   Financial Resource Strain:   . Difficulty of Paying Living Expenses:   Food Insecurity:   . Worried About Charity fundraiser in the Last Year:   . [place] in the Last Year:   Housing:   . Last Housing Risk Score:   Physical Activity:   . Days of Exercise per Week:   . Minutes of Exercise per Session:   Social Connections:   . Frequency of Communication with Friends and Family:   . Frequency of Social Gatherings with Friends and Family:   . Attends Religious Services:   . Active Member of Clubs or Organizations:   . Attends Archivist Meetings:   [name] Marital Status:   Stress:   . Feeling of Stress :   Tobacco Use: Medium Risk  . Smoking Tobacco Use: Former Smoker  . Smokeless  Tobacco Use: Never Used  Transportation Needs:   . Film/video editor (Medical):   [name] Lack of Transportation (Non-Medical):      Current Diagnosis/Assessment:  Goals Addressed   None     AFIB   Patient is currently {CHL HP BP RATE/RHYTHM:[phone]} controlled. HR *** BPM  Patient has failed these meds in past: *** Patient is currently {CHL Controlled/Uncontrolled:[phone]} on the following medications: ***  Amiodarone 200 mg daily  Eliquis 5 mg bid  We discussed:  {CHL HP Upstream Pharmacy discussion:[phone]}  Plan  Continue {CHL HP Upstream Pharmacy Plans:[phone]}  ,  COPD / Asthma / Tobacco   Last spirometry score: ***  Gold Grade: {CHL HP Upstream Pharm COPD Gold AGTXM:[id number]} Current COPD Classification:  {CHL HP Upstream Pharm COPD Classification:[phone]}  Eosinophil count:  No results found for: EOSPCT%  Eos (Absolute):  Lab Results  Component Value Date/Time   EOSABS 0.3 05/31/2020 10:48 AM    Tobacco Status:  Social History   Tobacco Use  Smoking Status Former Smoker  . Quit date: 60  . Years since quitting: 28.6  Smokeless Tobacco Never Used    Patient has failed these meds in past: Breo ellipta Patient is currently {CHL Controlled/[phone]} on the following medications: ***  Trelegy 100-62.5-25 1 puff into the lungs daily  Duoneb every 6 hours prn shortness of breath  Levoceterizine 5 mg daily prn allergies  Montelukast 10 mg daily every evening  Ventolin 2 puffs every 4 hours prn shortness of breath wheezing   Using maintenance inhaler regularly? {yes/no:20286} Frequency of rescue inhaler use:  {CHL HP Upstream Pharm Inhaler AGTX:[id number]}  We discussed:  {CHL HP Upstream Pharmacy [phone]}  Plan  Continue {CHL HP Upstream Pharmacy [phone]} ,  Diabetes   Recent Relevant Labs: Lab Results  Component Value Date/Time   HGBA1C 12.0 (H)  05/31/2020 10:48 AM   HGBA1C 9.0 (H) 02/23/2020 10:56 AM   MICROALBUR 150 05/31/2020 11:09 AM     Kidney Function Lab Results  Component Value Date/Time   CREATININE 1.32 (H) 05/31/2020 10:48 AM   CREATININE 1.65 (H) 03/19/2020 01:45 PM   GFRNONAA 52 (L) 05/31/2020 10:48 AM   GFRAA 60 05/31/2020 10:48 AM   K 4.8 05/31/2020 10:48 AM   K 4.1 03/19/2020 01:45 PM    Checking BG: {CHL HP Blood Glucose Monitoring Frequency:[phone]}  Recent FBG Readings: Recent pre-meal BG readings: *** Recent 2hr PP BG readings:  *** Recent HS BG readings: *** Patient has failed these meds in past: Tresiba, Victoza, metformin Patient is currently {CHL Controlled/[phone]} on the following medications: ***  Glipizide 10 mg bid before a meal  Soliqua 100-33 unit-mcg/mL 50 units into the skin daily  BD Pen needle 32gx74m use with Soliqua  Last diabetic Foot exam: No results found for: HMDIABEYEEXA  Last diabetic Eye exam: No results found for: HMDIABFOOTEX   We discussed: {CHL HP Upstream Pharmacy [phone]}  Plan  Continue {CHL HP Upstream Pharmacy [phone]},  Heart Failure   Type: {type of heart failure:[phone]}  Last ejection fraction: hydralazine NYHA Class: {CHL HP Upstream Pharm NYHA Class:[phone]} AHA HF Stage: {CHL HP Upstream Pharm AHA HF Stage:[phone]}  Office blood pressures are  BP Readings from Last 3 Encounters:  06/28/20 (!) 138/72  05/31/20 132/70  04/11/20 (!) 170/80    Patient has failed these meds in past: carvedilol, furosemide, hydralazine Patient is currently {CHL Controlled/[phone]} on the following medications:   Metolazone 5 mg once weekly  Torsemide 20 mg twice daily with an extra tablet in the afternoon on Monday, Wednesday and Friday  Metoprolol 50 mg tid  We discussed {CHL HP Upstream Pharmacy [phone]}  Plan  Continue {CHL HP Upstream Pharmacy  [phone]}  Hyperlipidemia   LDL goal < ***  Lipid Panel     Component Value Date/Time   CHOL 153 05/31/2020 1048   TRIG 173 (H) 05/31/2020 1048   HDL 36 (L) 05/31/2020 1048   LDLCALC 87 05/31/2020 1048    Hepatic Function Latest Ref Rng & Units 05/31/2020 03/19/2020 02/23/2020  Total Protein 6.0 - 8.5 g/dL 6.2 6.6 6.2  Albumin 3.7 - 4.7 g/dL 3.6(L) 4.3 3.9  AST 0 - 40 IU/L 33 44(H) 38  ALT 0 - 44 IU/L 22 36 35  Alk Phosphatase 48 - 121 IU/L 140(H) 129(H) 130(H)  Total Bilirubin 0.0 - 1.2 mg/dL  0.2 1.0 0.6     The 10-year ASCVD risk score Mikey Bussing DC Jr., et al., 2013) is: 55.5%   Values used to calculate the score:     Age: 40 years     Sex: Male     Is Non-Hispanic African American: No     Diabetic: Yes     Tobacco smoker: No     Systolic Blood Pressure: 389 mmHg     Is BP treated: Yes     HDL Cholesterol: 36 mg/dL     Total Cholesterol: 153 mg/dL   Patient has failed these meds in past: atorvastatin Patient is currently {CHL Controlled/Uncontrolled:[phone]} on the following medications:  . Rosuvastatin 20 mg daily . Omega-3 2000 mg bid  We discussed:  {CHL HP Upstream Pharmacy discussion:[phone]}  Plan  Continue {CHL HP Upstream Pharmacy Plans:[phone]}   Chronic Pain   Patient has failed these meds in past: *** Patient is currently {CHL Controlled/Uncontrolled:[phone]} on the following medications:  . Acetaminophen 1000 mg every 6 hours prn pain or headache . Hydrocodone 10-325 mg every 4 hours as needed for moderate pain  We discussed:  ***  Plan  Continue {CHL HP Upstream Pharmacy Plans:[phone]}  ***Depression and Anxiety   Patient has failed these meds in past: *** Patient is currently {CHL Controlled/Uncontrolled:[phone]} on the following medications:  . Sertraline 50 mg bid  We discussed:  ***  Plan  Continue {CHL HP Upstream Pharmacy Plans:[phone]}   ***GERD   Patient has failed these meds in past: *** Patient is  currently {CHL Controlled/Uncontrolled:[phone]} on the following medications:  . Omeprazole 20 mg bid before a meal  We discussed:  ***  Plan  Continue {CHL HP Upstream Pharmacy Plans:[phone]}   BPH***   Patient has failed these meds in past: *** Patient is currently {CHL Controlled/Uncontrolled:[phone]} on the following medications:  . tamsulosin 0.4 mg before bedtime  We discussed:  ***  Plan  Continue {CHL HP Upstream Pharmacy Plans:[phone]}  ***Restless Leg Syndrome   Patient has failed these meds in past: *** Patient is currently {CHL Controlled/Uncontrolled:[phone]} on the following medications:  . Ropinirole 0.5 mg at bedtime  We discussed:  ***  Plan  Continue {CHL HP Upstream Pharmacy HTDSK:[id number]}     Health Maintenance   Patient is currently {CHL Controlled/Uncontrolled:[phone]} on the following medications:  [name] Vitamin B-12 5000 mcg daily - *** . Folic acid 203 mcg daily - *** . Cinnamon 700 mg daily - *** . Vitamin D3 1000 unit daily - *** . Magnesium 64 mg bid - *** . Potassium chloride 20 meq bid - *** . Vitamin B-6 daily - *** .   We discussed:  ***  Plan  Continue {CHL HP Upstream Pharmacy TDHRC:[id number]}  Vaccines   Reviewed and discussed patient's vaccination history.    Immunization History  Administered Date(s) Administered  . Influenza-Unspecified 08/16/2019  . Moderna SARS-COVID-2 Vaccination 12/22/2019, 01/19/2020  . Pneumococcal Conjugate-13 07/26/2014  . Pneumococcal Polysaccharide-23 08/19/2012    Plan  Recommended patient receive *** vaccine in *** office.   Medication Management   Pt uses [facility] for all medications Uses pill box? {Yes or If no, why not?:20788} Pt endorses ***% compliance  We discussed: ***  Plan  {[facility] OEHO:12248}    Follow up: *** month phone visit  ***

## 2020-07-03 ENCOUNTER — Telehealth: Payer: Medicare HMO

## 2020-07-04 ENCOUNTER — Ambulatory Visit: Payer: Medicare HMO

## 2020-07-04 ENCOUNTER — Other Ambulatory Visit: Payer: Self-pay

## 2020-07-08 ENCOUNTER — Ambulatory Visit: Payer: Medicare HMO

## 2020-07-08 ENCOUNTER — Other Ambulatory Visit: Payer: Self-pay

## 2020-07-08 DIAGNOSIS — E782 Mixed hyperlipidemia: Secondary | ICD-10-CM

## 2020-07-08 DIAGNOSIS — E0842 Diabetes mellitus due to underlying condition with diabetic polyneuropathy: Secondary | ICD-10-CM

## 2020-07-08 DIAGNOSIS — N183 Chronic kidney disease, stage 3 unspecified: Secondary | ICD-10-CM

## 2020-07-08 DIAGNOSIS — Z794 Long term (current) use of insulin: Secondary | ICD-10-CM

## 2020-07-08 NOTE — Patient Instructions (Signed)
Visit Information  Thank you for your time discussing your medications. I look forward to working with you to achieve your health care goals. Below is a summary of what we talked about during our visit.   Goals Addressed            This Visit's Progress   . Pharmacy Care Plan       CARE PLAN ENTRY (see longitudinal plan of care for additional care plan information)  Current Barriers:  . Chronic Disease Management support, education, and care coordination needs related to Hypertension, Hyperlipidemia, Diabetes, and COPD   Hypertension BP Readings from Last 3 Encounters:  06/28/20 (!) 138/72  05/31/20 132/70  04/11/20 (!) 170/80   . Pharmacist Clinical Goal(s): o Over the next 90 days, patient will work with PharmD and providers to achieve BP goal <130/80 . Current regimen:  o Metoprolol 50 mg three times daily o Torsemide 40 mg twice daily o Metolazone 5 mg once weekly as needed for swelling . Interventions: o Discussed benefits of exercise. Increase activity as tolerated to goal of 150 minutes each week beginning with 5-10 minutes each day.  . Patient self care activities - Over the next 90 days, patient will: o Check BP weekly, document, and provide at future appointments o Ensure daily salt intake < 2300 mg/day  Hyperlipidemia Lab Results  Component Value Date/Time   LDLCALC 87 05/31/2020 10:48 AM   . Pharmacist Clinical Goal(s): o Over the next 90 days, patient will work with PharmD and providers to achieve LDL goal < 70 . Current regimen:  o Rosuvastatin 20 mg daily o Omega-3 Fish Oil 2000 mg twice daily  . Interventions: o Recommend patient incorporate daily exercise with goal of 150 minutes. o Continue work on [employer] of lean meats, vegetables and fruit and whole grains in moderation.   o Continue taking medication as prescribed.  . Patient self care activities - Over the next 90 days, patient will: o Take medication as prescribed.  o Contact pharmacist  or provider with any questions.   Diabetes Lab Results  Component Value Date/Time   HGBA1C 12.0 (H) 05/31/2020 10:48 AM   HGBA1C 9.0 (H) 02/23/2020 10:56 AM   . Pharmacist Clinical Goal(s): o Over the next 90 days, patient will work with PharmD and providers to achieve A1c goal <7% . Current regimen:  o Trulicity 2.83 mg weekly for 4 weeks then increase to 0.5 mg weekly for 4 weeks then begin 1 mg weekly.  o Tresiba 50 units daily.  . Interventions: o Discussed current Diabetes regimen. Recommended discontinuing glipizide by mouth. Recommend changing insulin and injection regimen to Trulicity 6.62 mg weekly x 4 weeks then increase to 0.5 mg weekly for 4 weeks then increase 1 mg weekly.  o Discussed appropriate ways to correct low blood sugar.  o Discussed benefits of optimal blood sugar control. . Patient self care activities - Over the next 90 days, patient will: o Check blood sugar once daily, document, and provide at future appointments o Contact provider with any episodes of hypoglycemia  COPD . Pharmacist Clinical Goal(s) o Over the next 90 days, patient will work with PharmD and providers to improve adherence with maintenance regimen.  . Current regimen:  o Trelegy 100-62.5-25 1 puff daily o Montelukast 10 mg every evening o Ventolin 2 puffs every 4 hours prn shortness of breath or wheezing . Interventions: o Recommend consdering Breztri due to availability through patient assistance.  o Recommend using maintenance inhaler every  day to prevent use of rescue inhaler or exacerbations.  . Patient self care activities - Over the next 90 days, patient will: o Use maintenance inhaler daily.  o Complete Patient Assistance for Breztri to improve access to care.   Medication management . Pharmacist Clinical Goal(s): o Over the next 90 days, patient will work with PharmD and providers to achieve optimal medication adherence . Current pharmacy: [facility]  [place] . Interventions o Comprehensive medication review performed. o Utilize [facility] for medication synchronization, packaging and delivery . Patient self care activities - Over the next 90 days, patient will: o Focus on medication adherence by continuing to use pill box.  o Take medications as prescribed o Report any questions or concerns to PharmD and/or provider(s)  Initial goal documentation        Shawn Sullivan was given information about Chronic Care Management services today including:  1. CCM service includes personalized support from designated clinical staff supervised by his physician, including individualized plan of care and coordination with other care providers 2. 24/7 contact phone numbers for assistance for urgent and routine care needs. 3. Standard insurance, coinsurance, copays and deductibles apply for chronic care management only during months in which we provide at least 20 minutes of these services. Most insurances cover these services at 100%, however patients may be responsible for any copay, coinsurance and/or deductible if applicable. This service may help you avoid the need for more expensive face-to-face services. 4. Only one practitioner may furnish and bill the service in a calendar month. 5. The patient may stop CCM services at any time (effective at the end of the month) by phone call to the office staff.  Patient agreed to services and verbal consent obtained.   The patient verbalized understanding of instructions provided today and agreed to receive a mailed copy of patient instruction and/or educational materials. Telephone follow up appointment with pharmacy team member scheduled for: 07/2020  Sherre Poot, PharmD Clinical Pharmacist [place] [phone] (office) [phone] (mobile)

## 2020-07-08 NOTE — Chronic Care Management (AMB) (Signed)
Chronic Care Management Pharmacy  Name: ARACELI COUFAL  MRN: [id number] DOB: 1946-10-20  Chief Complaint/ HPI  [name],  74 y.o. , male presents for their Initial CCM visit with the clinical pharmacist via telephone due to COVID-19 Pandemic.  PCP : Rochel Brome, MD  Their chronic conditions include: afib, hypertensive heart and kidney disease, chronic diastolic congestive heart failure, ckd stage 3, COPD, OSA, GERD, Type 2 DM, restless legs syndrome, polyneuropathy, hyperlipidemia, depressive disorder, chronic pain, anxiety.   Office Visits: 05/31/2020 - trial of ropinirole for leg pain/anxiety at night.  04/11/2020 - balanitis with urinary incontinence. Ordered condom catheter, fluconazole 100 mg, clotrimazole.  02/23/2020 - continue current medications.  Consult Visit: 06/28/2020 - Cardiology visit - stable rhythm continue amiodarone. Heart failure compensated. Needs full thyroid panel and proBNP level at next labs.  05/15/2020 - Pain management - telemed visit. Refill opioid medications.  02/13/2020 - Pain management - telemed visit. Refill opioid medications.   Medications: Outpatient Encounter Medications as of 07/08/2020  Medication Sig  . acetaminophen (TYLENOL) 500 MG tablet Take 1,000 mg by mouth every 6 (six) hours as needed for moderate pain or headache.  [name] amiodarone (PACERONE) 200 MG tablet Take 1 tablet (200 mg total) by mouth daily. (Patient taking differently: Take 200 mg by mouth every evening. )  . apixaban (ELIQUIS) 5 MG TABS tablet Take 5 mg by mouth 2 (two) times daily.   [name] b complex vitamins tablet Take 1 tablet by mouth daily.  . BD PEN NEEDLE MICRO U/F 32G X 6 MM MISC SMARTSIG:1 Needle SUB-Q Daily  . cholecalciferol (VITAMIN D3) 25 MCG (1000 UT) tablet Take 1,000 Units by mouth daily.   [name] CINNAMON PO Take 700 mg by mouth daily.   . Fluticasone-Umeclidin-Vilant (TRELEGY ELLIPTA) 100-62.5-25 MCG/INH AEPB Inhale 1 puff into the lungs daily.  .  folic acid (FOLVITE) 144 MCG tablet Take 800 mcg by mouth daily.  [name] glipiZIDE (GLUCOTROL) 10 MG tablet TAKE 1 TABLET BY MOUTH TWICE DAILY BEFORE A MEAL  . HYDROcodone-acetaminophen (NORCO) 10-325 MG tablet Take 1 tablet by mouth every 4 (four) hours as needed for moderate pain.   . Insulin Glargine-Lixisenatide (SOLIQUA) 100-33 UNT-MCG/ML SOPN Inject 55 Units into the skin daily.   [name] ipratropium-albuterol (DUONEB) 0.5-2.5 (3) MG/3ML SOLN Take 3 mLs by nebulization every 6 (six) hours as needed (shortness of breath).   [name] levocetirizine (XYZAL) 5 MG tablet Take 5 mg by mouth daily as needed for allergies.  . magnesium chloride (SLOW-MAG) 64 MG TBEC SR tablet Take 1 tablet (64 mg total) by mouth 2 (two) times daily. (Patient taking differently: Take 1 tablet by mouth daily. )  . metolazone (ZAROXOLYN) 5 MG tablet Take 5 mg by mouth once a week. Uses prn if swollen once weekly.  . metoprolol tartrate (LOPRESSOR) 50 MG tablet Take 1 tablet (50 mg total) by mouth every 6 (six) hours. (Patient taking differently: Take 50 mg by mouth in the morning, at noon, and at bedtime. )  . montelukast (SINGULAIR) 10 MG tablet Take 1 tablet (10 mg total) by mouth every evening.  . Omega-3 Fatty Acids (OMEGA-3 FISH OIL CONCENTRATE) 1000 MG CPDR Take 2,000 mg by mouth 2 (two) times a day.  . Omeprazole 20 MG TBEC Take 20 mg by mouth 2 (two) times daily before a meal.   . potassium chloride SA (K-DUR) 20 MEQ tablet Take 20 mEq by mouth 2 (two) times daily.  [name] rOPINIRole (REQUIP) 0.5 MG tablet  Take 1 tablet (0.5 mg total) by mouth at bedtime. (Patient taking differently: Take 0.5 mg by mouth at bedtime. )  . rosuvastatin (CRESTOR) 20 MG tablet Take 1 tablet (20 mg total) by mouth daily. (Patient taking differently: Take 20 mg by mouth at bedtime. )  . sertraline (ZOLOFT) 50 MG tablet Take 1 tablet by mouth twice daily  . tamsulosin (FLOMAX) 0.4 MG CAPS capsule TAKE 1 CAPSULE BY MOUTH BEFORE BEDTIME  . torsemide (DEMADEX)  20 MG tablet Take 1 tablet (20 mg total) by mouth 2 (two) times daily. Take an extra tablet (20 mg) in the afternoon on Monday, Wednesday, Friday. (Patient taking differently: Take 40 mg by mouth 2 (two) times daily. 2 in the am and 2 at noon)  . VENTOLIN HFA 108 (90 Base) MCG/ACT inhaler Inhale 2 puffs into the lungs every 4 (four) hours as needed for wheezing or shortness of breath.  . Catheters (FREEDOM CATH MALE EXT CATHETER) MISC 1 each by Does not apply route daily.  . Cyanocobalamin (B-12) 5000 MCG CAPS Take 5,000 mcg by mouth daily. (Patient not taking: Reported on 07/08/2020)  . pyridOXINE (VITAMIN B-6) 100 MG tablet Take 100 mg by mouth daily. (Patient not taking: Reported on 07/08/2020)   No facility-administered encounter medications on file as of 07/08/2020.   Allergies  Allergen Reactions  . Androgel [Testosterone] Other (See Comments)    Pedal edema   . Biaxin [Clarithromycin] Other (See Comments)    Taste perception alteration  . Duloxetine Other (See Comments)    Hallucinations  . Gabapentin Other (See Comments)    hallucinations  . Ibuprofen Other (See Comments)    GI upset   . Lyrica [Pregabalin] Swelling  . Spironolactone Other (See Comments)   SDOH Screenings   Alcohol Screen:   . Last Alcohol Screening Score (AUDIT):   Depression (PHQ2-9):   . PHQ-2 Score:   Financial Resource Strain:   . Difficulty of Paying Living Expenses:   Food Insecurity:   . Worried About Charity fundraiser in the Last Year:   . [place] in the Last Year:   Housing:   . Last Housing Risk Score:   Physical Activity:   . Days of Exercise per Week:   . Minutes of Exercise per Session:   Social Connections:   . Frequency of Communication with Friends and Family:   . Frequency of Social Gatherings with Friends and Family:   . Attends Religious Services:   . Active Member of Clubs or Organizations:   . Attends Archivist Meetings:   [name] Marital Status:   Stress:   .  Feeling of Stress :   Tobacco Use: Medium Risk  . Smoking Tobacco Use: Former Smoker  . Smokeless Tobacco Use: Never Used  Transportation Needs:   . Film/video editor (Medical):   [name] Lack of Transportation (Non-Medical):      Current Diagnosis/Assessment:  Goals Addressed            This Visit's Progress   . Pharmacy Care Plan       CARE PLAN ENTRY (see longitudinal plan of care for additional care plan information)  Current Barriers:  . Chronic Disease Management support, education, and care coordination needs related to Hypertension, Hyperlipidemia, Diabetes, and COPD   Hypertension BP Readings from Last 3 Encounters:  06/28/20 (!) 138/72  05/31/20 132/70  04/11/20 (!) 170/80   . Pharmacist Clinical Goal(s): o Over the next 90 days, patient will  work with PharmD and providers to achieve BP goal <130/80 . Current regimen:  o Metoprolol 50 mg three times daily o Torsemide 40 mg twice daily o Metolazone 5 mg once weekly as needed for swelling . Interventions: o Discussed benefits of exercise. Increase activity as tolerated to goal of 150 minutes each week beginning with 5-10 minutes each day.  . Patient self care activities - Over the next 90 days, patient will: o Check BP weekly, document, and provide at future appointments o Ensure daily salt intake < 2300 mg/day  Hyperlipidemia Lab Results  Component Value Date/Time   LDLCALC 87 05/31/2020 10:48 AM   . Pharmacist Clinical Goal(s): o Over the next 90 days, patient will work with PharmD and providers to achieve LDL goal < 70 . Current regimen:  o Rosuvastatin 20 mg daily o Omega-3 Fish Oil 2000 mg twice daily  . Interventions: o Recommend patient incorporate daily exercise with goal of 150 minutes. o Continue work on [employer] of lean meats, vegetables and fruit and whole grains in moderation.   o Continue taking medication as prescribed.  . Patient self care activities - Over the next 90 days, patient  will: o Take medication as prescribed.  o Contact pharmacist or provider with any questions.   Diabetes Lab Results  Component Value Date/Time   HGBA1C 12.0 (H) 05/31/2020 10:48 AM   HGBA1C 9.0 (H) 02/23/2020 10:56 AM   . Pharmacist Clinical Goal(s): o Over the next 90 days, patient will work with PharmD and providers to achieve A1c goal <7% . Current regimen:  o Trulicity 6.76 mg weekly for 4 weeks then increase to 0.5 mg weekly for 4 weeks then begin 1 mg weekly.  o Tresiba 50 units daily.  . Interventions: o Discussed current Diabetes regimen. Recommended discontinuing glipizide by mouth. Recommend changing insulin and injection regimen to Trulicity 1.95 mg weekly x 4 weeks then increase to 0.5 mg weekly for 4 weeks then increase 1 mg weekly.  o Discussed appropriate ways to correct low blood sugar.  o Discussed benefits of optimal blood sugar control. . Patient self care activities - Over the next 90 days, patient will: o Check blood sugar once daily, document, and provide at future appointments o Contact provider with any episodes of hypoglycemia  COPD . Pharmacist Clinical Goal(s) o Over the next 90 days, patient will work with PharmD and providers to improve adherence with maintenance regimen.  . Current regimen:  o Trelegy 100-62.5-25 1 puff daily o Montelukast 10 mg every evening o Ventolin 2 puffs every 4 hours prn shortness of breath or wheezing . Interventions: o Recommend consdering Breztri due to availability through patient assistance.  o Recommend using maintenance inhaler every day to prevent use of rescue inhaler or exacerbations.  . Patient self care activities - Over the next 90 days, patient will: o Use maintenance inhaler daily.  o Complete Patient Assistance for Breztri to improve access to care.   Medication management . Pharmacist Clinical Goal(s): o Over the next 90 days, patient will work with PharmD and providers to achieve optimal medication  adherence . Current pharmacy: [facility] . Interventions o Comprehensive medication review performed. o Utilize [facility] for medication synchronization, packaging and delivery . Patient self care activities - Over the next 90 days, patient will: o Focus on medication adherence by continuing to use pill box.  o Take medications as prescribed o Report any questions or concerns to PharmD and/or provider(s)  Initial goal documentation  AFIB   Patient is currently rhythm controlled. HR 77 BPM  Patient has failed these meds in past: n/a Patient is currently controlled on the following medications:   Amiodarone 200 mg daily  Eliquis 5 mg bid  We discussed:  Everything looked good at last Cardiology visit. Echo scheduled for a few weeks out. Eliquis is very expensive for patient. MD has been providing samples. Pharmacist assisting with patient assistance application at this time.   Plan  Continue current medications  ,  COPD / Asthma / Tobacco   Eosinophil count:  No results found for: EOSPCT%                               Eos (Absolute):  Lab Results  Component Value Date/Time   EOSABS 0.3 05/31/2020 10:48 AM    Tobacco Status:  Social History   Tobacco Use  Smoking Status Former Smoker  . Quit date: 54  . Years since quitting: 28.6  Smokeless Tobacco Never Used    Patient has failed these meds in past: Breo ellipta Patient is currently uncontrolled on the following medications:   Trelegy 100-62.5-25 1 puff into the lungs daily  Duoneb every 6 hours prn shortness of breath  Levoceterizine 5 mg daily prn allergies  Montelukast 10 mg daily every evening  Ventolin 2 puffs every 4 hours prn shortness of breath wheezing   Using maintenance inhaler regularly? Yes Frequency of rescue inhaler use:  1-2x per week  We discussed:  proper inhaler technique. Patient has difficulty affording Trelegy with medicare plan. Patient sometimes skips  doses if low on samples of Trelegy due to cost. Pharmacist and daughter discussed patient assistance for Trelegy or Breztri. Patient has to use rescue inhaler more often when spreading out doses of Trelegy. Patient's daughter understands the importance of daily use of maintenance inhaler. Patient uses levoceterizine during spring and fall allergy season. Consider changing to [facility] due to patient assistance coverage. Patient is unsure that he has spent $600 to be eligible for Trelegy.   Plan  Consider changing tor [name] due to increased availability with patient assistance.  ,  Diabetes   Recent Relevant Labs: Lab Results  Component Value Date/Time   HGBA1C 12.0 (H) 05/31/2020 10:48 AM   HGBA1C 9.0 (H) 02/23/2020 10:56 AM   MICROALBUR 150 05/31/2020 11:09 AM     Kidney Function Lab Results  Component Value Date/Time   CREATININE 1.32 (H) 05/31/2020 10:48 AM   CREATININE 1.65 (H) 03/19/2020 01:45 PM   GFRNONAA 52 (L) 05/31/2020 10:48 AM   GFRAA 60 05/31/2020 10:48 AM   K 4.8 05/31/2020 10:48 AM   K 4.1 03/19/2020 01:45 PM    Checking BG: Daily  Recent FBG Readings: 300-400s  Patient has failed these meds in past: Tresiba, Victoza, metformin Patient is currently uncontrolled on the following medications:   Glipizide 10 mg bid before a meal  Soliqua 100-33 unit-mcg/mL 55 units into the skin daily  BD Pen needle 32gx46m use with Soliqua  Last diabetic Foot exam: July 2021 Last diabetic Eye exam: Daughter uncertain of exact date but has been completed in the last year.   We discussed: diet and exercise extensively. Patient enjoys garden vegetables. Daughter reports that he often overcorrects low blood sugar with cookies or more than 1/2 cup of orange juice. Patient has not had low blood sugar lately and has been running in the 300-400s. Daughter is encouraging healthier lifestyle choices  with food and exercise. Hasn't felt well lately but does have a pedal exerciser that he  could use. Patient's daughter frequently reminds him of sugar sources in his diet.   Plan   Recommend consider changing regimen for blood sugar management to Trulicity 1.61 mg weekly with goal of increasing to 1.5 mg in 1 month to increase to ultimate goal 1 mg weekly if tolerated.   Recommend consder changing insulin to [place] 50 units daily. Will follow-up with patient's daughter to adjust regimen with increasing dose of Trulicity.   Recommend discontinuation of glipizide to simplify regimen and decrease risk of hypoglycemia as regimen gets blood sugar to goal.  Heart Failure managed by Cardiology   Type: Diastolic  Last ejection fraction: hydralazine  Office blood pressures are  BP Readings from Last 3 Encounters:  06/28/20 (!) 138/72  05/31/20 132/70  04/11/20 (!) 170/80    Patient has failed these meds in past: carvedilol, furosemide, hydralazine Patient is currently controlled on the following medications:   Metolazone 5 mg once weekly prn  Torsemide 40 mg twice daily  Metoprolol 50 mg tid  We discussed diet and exercise extensively. Patient's daughter prepares some of his meals and will bake/broil chicken with vegetables. She limits his sources of carbohydrates/fruits with meals. Works to restrict sodium to help swelling.   Plan  Continue current medications  Hyperlipidemia   LDL goal < 70  Lipid Panel     Component Value Date/Time   CHOL 153 05/31/2020 1048   TRIG 173 (H) 05/31/2020 1048   HDL 36 (L) 05/31/2020 1048   LDLCALC 87 05/31/2020 1048    Hepatic Function Latest Ref Rng & Units 05/31/2020 03/19/2020 02/23/2020  Total Protein 6.0 - 8.5 g/dL 6.2 6.6 6.2  Albumin 3.7 - 4.7 g/dL 3.6(L) 4.3 3.9  AST 0 - 40 IU/L 33 44(H) 38  ALT 0 - 44 IU/L 22 36 35  Alk Phosphatase 48 - 121 IU/L 140(H) 129(H) 130(H)  Total Bilirubin 0.0 - 1.2 mg/dL 0.2 1.0 0.6     The 10-year ASCVD risk score Mikey Bussing DC Jr., et al., 2013) is: 55.5%   Values used to calculate the  score:     Age: 63 years     Sex: Male     Is Non-Hispanic African American: No     Diabetic: Yes     Tobacco smoker: No     Systolic Blood Pressure: 096 mmHg     Is BP treated: Yes     HDL Cholesterol: 36 mg/dL     Total Cholesterol: 153 mg/dL   Patient has failed these meds in past: atorvastatin Patient is currently uncontrolled on the following medications:  . Rosuvastatin 20 mg daily . Omega-3 2000 mg bid  We discussed:  diet and exercise extensively. Discussed patient's goal of not having to increase statin at this time. Patient encouraged to improve glucose control, healthy diet and exercise to avoid increased dose. Discussed the role of blood sugar in elevated triglycerides. Patient sometimes skips fish oil due to difficulty swallowing.   Plan  Continue current medications   Chronic Pain   Patient has failed these meds in past: none reported Patient is currently controlled on the following medications:  . Acetaminophen 1000 mg every 6 hours prn pain or headache . Hydrocodone 10-325 mg every 4 hours as needed for moderate pain  We discussed:  Patient's daughter is aware of acetaminophen daily intake. Patient knows not to use hydrocodone-acetaminophen with OTC acetaminophen. Daughter monitors intake  of both.   Plan  Continue current medications  Depression and Anxiety   Patient has failed these meds in past: none reported Patient is currently controlled on the following medications:  . Sertraline 50 mg bid  We discussed:  Daughter reports that patient is stable on current dose. She states that he is frustrated by what he can no longer do due to health. Daughter is encouraging him to begin exercising and consider physical therapy to improve. Patient has a boat and enjoys getting out on lake when he feels good. Patient sometimes sleeps more during the day if not resting well at night. Still drives some but daughter drives most of the time due to neuropathy. Daughter states  that patient has felt bad in the last week but attributes it to elevated blood sugar. She will continue to monitor and contact provider or pharmacist if treatment regimen adjustment needed.   Plan  Continue current medications   GERD   Patient has failed these meds in past: nore reported Patient is currently controlled on the following medications:  . Omeprazole 20 mg OTC bid before a meal  We discussed:  Patient's daughter has been purchasing OTC due to cost of prescription. Patient symptoms well controlled at this time. Discussed healthier eating, limited portions and weight loss.   Plan  Continue current medications   BPH   Patient has failed these meds in past: none reported  Patient is currently controlled on the following medications:  . tamsulosin 0.4 mg before bedtime  We discussed:  Patient's medication affordable at this time.   Plan  Continue current medications  Restless Leg Syndrome   Patient has failed these meds in past: none reported Patient is currently controlled on the following medications:  . Ropinirole 0.5 mg at bedtime  We discussed:  Patient is new to treatment but reports that his legs are improved and allowing him to rest better.   Plan  Continue current medications     Health Maintenance   Patient is currently controlled on the following medications:  . Folic acid 952 mcg daily - supplementation . Cinnamon 700 mg daily - supplementation . Vitamin D3 1000 unit daily - supplementation . Magnesium 64 mg daily - supplementation . Potassium chloride 20 meq bid - low potassium . B-complex daily - supplementation.    We discussed:  Patient's daughter will tweak potassium dosing as needed each week. If he take the metolazone she will often give him an extra potassium on those days. Nephrology has  Approved her adding an extra potassium on days he uses metolazone.   Plan  Continue current medications  Vaccines   Reviewed and discussed  patient's vaccination history.    Immunization History  Administered Date(s) Administered  . Influenza-Unspecified 08/16/2019  . Moderna SARS-COVID-2 Vaccination 12/22/2019, 01/19/2020  . Pneumococcal Conjugate-13 07/26/2014  . Pneumococcal Polysaccharide-23 08/19/2012    Plan  Recommended patient receive annual flu vaccine in office.   Medication Management   Pt uses [facility] for all medications Uses pill box? Yes Pt endorses fair compliance  We discussed: Affordability limits some of patient's compliance. Daughter prepares pill boxes every 2 weeks and checks in daily. Daughter often administers insulin shot for patient.   Verbal consent obtained for [facility] enhanced pharmacy services (medication synchronization, adherence packaging, delivery coordination). A medication sync plan was created to allow patient to get all medications delivered once every 30 to 90 days per patient preference. Patient understands they have freedom to choose pharmacy  and clinical pharmacist will coordinate care between all prescribers and [facility].  Plan  Utilize [facility] for medication synchronization, packaging and delivery    Follow up: 3 month phone visit

## 2020-07-11 ENCOUNTER — Telehealth: Payer: Medicare HMO

## 2020-07-11 ENCOUNTER — Telehealth: Payer: Self-pay | Admitting: Family Medicine

## 2020-07-11 NOTE — Progress Notes (Signed)
  Chronic Care Management   Outreach Note  07/11/2020 Name: Shawn Sullivan MRN: [id number] DOB: 1946-08-18  Referred by: Rochel Brome, MD Reason for referral : No chief complaint on file.   An unsuccessful telephone outreach was attempted today. The patient was referred to the pharmacist for assistance with care management and care coordination.   Follow Up Plan:   Earney [place] [facility]

## 2020-07-12 ENCOUNTER — Other Ambulatory Visit: Payer: Self-pay

## 2020-07-12 DIAGNOSIS — D631 Anemia in chronic kidney disease: Secondary | ICD-10-CM

## 2020-07-12 DIAGNOSIS — N189 Chronic kidney disease, unspecified: Secondary | ICD-10-CM

## 2020-07-12 DIAGNOSIS — N1832 Chronic kidney disease, stage 3b: Secondary | ICD-10-CM

## 2020-07-15 ENCOUNTER — Encounter: Payer: Self-pay | Admitting: Legal Medicine

## 2020-07-15 ENCOUNTER — Other Ambulatory Visit: Payer: Self-pay | Admitting: Family Medicine

## 2020-07-15 ENCOUNTER — Other Ambulatory Visit: Payer: Self-pay

## 2020-07-15 ENCOUNTER — Ambulatory Visit (INDEPENDENT_AMBULATORY_CARE_PROVIDER_SITE_OTHER): Payer: Medicare HMO | Admitting: Legal Medicine

## 2020-07-15 VITALS — BP 140/70 | HR 77 | Temp 98.0°F | Resp 17 | Ht 72.0 in | Wt 333.0 lb

## 2020-07-15 DIAGNOSIS — N50811 Right testicular pain: Secondary | ICD-10-CM | POA: Insufficient documentation

## 2020-07-15 MED ORDER — CIPROFLOXACIN HCL 250 MG PO TABS: 250.0000 mg | ORAL_TABLET | Freq: Every day | ORAL | 0 refills | Status: DC

## 2020-07-15 NOTE — Progress Notes (Signed)
Acute Office Visit  Subjective:    Patient ID: Shawn Sullivan, male    DOB: 05/24/1946, 74 y.o.   MRN: [id number]  Chief Complaint  Patient presents with  . [place] Pain    Swelling     HPI Patient is in today for severe right testicular pain.  It started after h st on testes 2 days ago.  It is now very painful and swollen. He is on eliquis.  Past Medical History:  Diagnosis Date  . Anticoagulated 07/01/2018  . Anticoagulated 07/01/2018  . Benign essential hypertension 05/06/2013  . Chronic obstructive pulmonary disease ([place]) 11/05/2015  . CKD (chronic kidney disease) stage 2, GFR 60-89 ml/min 07/01/2018  . COPD (chronic obstructive pulmonary disease) ([place]) 07/01/2018  . Dermatochalasis 12/16/2015  . Disorder of bursae and tendons in shoulder region 01/05/2014  . Generalized edema 06/22/2016  . Hyperlipidemia 12/16/2015  . Hypertensive heart disease 07/01/2018  . Low back pain 08/17/2013  . Lumbosacral spondylosis 05/06/2013  . Obstructive sleep apnea 07/01/2018  . Persistent atrial fibrillation ([place]) 07/01/2018  . Piriformis syndrome 05/03/2014  . Postlaminectomy syndrome, lumbar region 05/06/2013  . Presbyopia of both eyes 12/16/2015  . Restless legs syndrome 03/23/2016  . Restrictive lung disease 06/22/2016  . Sleep apnea 05/06/2013  . Type 2 diabetes mellitus without complications ([place]) 58/03/2777    Past Surgical History:  Procedure Laterality Date  . APPENDECTOMY    . BACK SURGERY    . CARDIOVERSION N/A 12/01/2018   Procedure: CARDIOVERSION;  Surgeon: Sanda Klein, MD;  Location: [facility];  Service: Cardiovascular;  Laterality: N/A;  . CATARACT EXTRACTION Bilateral   . LUMBAR FUSION    . NECK SURGERY      Family History  Problem Relation Age of Onset  . Atrial fibrillation Mother   . Cancer Mother   . Heart disease Father     Social History   Socioeconomic History  . Marital status: Married    Spouse name: Not on file  . Number of children: Not on file  . Years of  education: Not on file  . Highest education level: Not on file  Occupational History  . Not on file  Tobacco Use  . Smoking status: Former Smoker    Quit date: 1993    Years since quitting: 28.6  . Smokeless tobacco: Never Used  Vaping Use  . Vaping Use: Never used  Substance and Sexual Activity  . Alcohol use: Not Currently  . Drug use: Not Currently    Types: Hydrocodone  . Sexual activity: Not on file  Other Topics Concern  . Not on file  Social History Narrative  . Not on file   Social Determinants of Health   Financial Resource Strain:   . Difficulty of Paying Living Expenses:   Food Insecurity: No Food Insecurity  . Worried About Charity fundraiser in the Last Year: Never true  . Ran Out of Food in the Last Year: Never true  Transportation Needs:   . Lack of Transportation (Medical):   [name] Lack of Transportation (Non-Medical):   Physical Activity:   . Days of Exercise per Week:   . Minutes of Exercise per Session:   Stress:   . Feeling of Stress :   Social Connections:   . Frequency of Communication with Friends and Family:   . Frequency of Social Gatherings with Friends and Family:   . Attends Religious Services:   . Active Member of Clubs or Organizations:   .  Attends Archivist Meetings:   [name] Marital Status:   Intimate Partner Violence:   . Fear of Current or Ex-Partner:   . Emotionally Abused:   [name] Physically Abused:   . Sexually Abused:     Outpatient Medications Prior to Visit  Medication Sig Dispense Refill  . acetaminophen (TYLENOL) 500 MG tablet Take 1,000 mg by mouth every 6 (six) hours as needed for moderate pain or headache.    [name] amiodarone (PACERONE) 200 MG tablet Take 1 tablet (200 mg total) by mouth daily. (Patient taking differently: Take 200 mg by mouth every evening. ) 90 tablet 1  . apixaban (ELIQUIS) 5 MG TABS tablet Take 5 mg by mouth 2 (two) times daily.     [name] b complex vitamins tablet Take 1 tablet by mouth daily.    . BD PEN  NEEDLE MICRO U/F 32G X 6 MM MISC SMARTSIG:1 Needle SUB-Q Daily    . Catheters (FREEDOM CATH MALE EXT CATHETER) MISC 1 each by Does not apply route daily. 30 each 3  . cholecalciferol (VITAMIN D3) 25 MCG (1000 UT) tablet Take 1,000 Units by mouth daily.     [name] CINNAMON PO Take 700 mg by mouth daily.     . Cyanocobalamin (B-12) 5000 MCG CAPS Take 5,000 mcg by mouth daily.     . Fluticasone-Umeclidin-Vilant (TRELEGY ELLIPTA) 100-62.5-25 MCG/INH AEPB Inhale 1 puff into the lungs daily. 1 each 2  . folic acid (FOLVITE) 433 MCG tablet Take 800 mcg by mouth daily.    [name] glipiZIDE (GLUCOTROL) 10 MG tablet TAKE 1 TABLET BY MOUTH TWICE DAILY BEFORE A MEAL 180 tablet 0  . HYDROcodone-acetaminophen (NORCO) 10-325 MG tablet Take 1 tablet by mouth every 4 (four) hours as needed for moderate pain.     . Insulin Glargine-Lixisenatide (SOLIQUA) 100-33 UNT-MCG/ML SOPN Inject 55 Units into the skin daily.     [name] ipratropium-albuterol (DUONEB) 0.5-2.5 (3) MG/3ML SOLN Take 3 mLs by nebulization every 6 (six) hours as needed (shortness of breath).     [name] levocetirizine (XYZAL) 5 MG tablet Take 5 mg by mouth daily as needed for allergies.    . magnesium chloride (SLOW-MAG) 64 MG TBEC SR tablet Take 1 tablet (64 mg total) by mouth 2 (two) times daily. (Patient taking differently: Take 1 tablet by mouth daily. ) 60 tablet 3  . metolazone (ZAROXOLYN) 5 MG tablet Take 5 mg by mouth once a week. Uses prn if swollen once weekly.    . metoprolol tartrate (LOPRESSOR) 50 MG tablet Take 1 tablet (50 mg total) by mouth every 6 (six) hours. (Patient taking differently: Take 50 mg by mouth in the morning, at noon, and at bedtime. ) 360 tablet 2  . montelukast (SINGULAIR) 10 MG tablet Take 1 tablet (10 mg total) by mouth every evening. 90 tablet 3  . Omega-3 Fatty Acids (OMEGA-3 FISH OIL CONCENTRATE) 1000 MG CPDR Take 2,000 mg by mouth 2 (two) times a day.    . Omeprazole 20 MG TBEC Take 20 mg by mouth 2 (two) times daily before a meal.      . potassium chloride SA (K-DUR) 20 MEQ tablet Take 20 mEq by mouth 2 (two) times daily.    [name] pyridOXINE (VITAMIN B-6) 100 MG tablet Take 100 mg by mouth daily.     [name] rOPINIRole (REQUIP) 0.5 MG tablet Take 1 tablet (0.5 mg total) by mouth at bedtime. (Patient taking differently: Take 0.5 mg by mouth at bedtime. ) 30 tablet 2  .  rosuvastatin (CRESTOR) 20 MG tablet Take 1 tablet (20 mg total) by mouth daily. (Patient taking differently: Take 20 mg by mouth at bedtime. ) 90 tablet 1  . sertraline (ZOLOFT) 50 MG tablet Take 1 tablet by mouth twice daily 180 tablet 0  . tamsulosin (FLOMAX) 0.4 MG CAPS capsule TAKE 1 CAPSULE BY MOUTH BEFORE BEDTIME 90 capsule 0  . torsemide (DEMADEX) 20 MG tablet Take 1 tablet (20 mg total) by mouth 2 (two) times daily. Take an extra tablet (20 mg) in the afternoon on Monday, Wednesday, Friday. (Patient taking differently: Take 40 mg by mouth 2 (two) times daily. 2 in the am and 2 at noon) 72 tablet 3  . VENTOLIN HFA 108 (90 Base) MCG/ACT inhaler Inhale 2 puffs into the lungs every 4 (four) hours as needed for wheezing or shortness of breath. 18 g 11   No facility-administered medications prior to visit.    Allergies  Allergen Reactions  . Androgel [Testosterone] Other (See Comments)    Pedal edema   . Biaxin [Clarithromycin] Other (See Comments)    Taste perception alteration  . Duloxetine Other (See Comments)    Hallucinations  . Gabapentin Other (See Comments)    hallucinations  . Ibuprofen Other (See Comments)    GI upset   . Lyrica [Pregabalin] Swelling  . Spironolactone Other (See Comments)    Review of Systems  Constitutional: Negative.   HENT: Negative.   Eyes: Negative.   Respiratory: Negative.   Cardiovascular: Negative.   Gastrointestinal: Negative.   Genitourinary: Positive for testicular pain.  Musculoskeletal: Negative.   Psychiatric/Behavioral: Negative.        Objective:    Physical Exam Vitals reviewed.  Constitutional:       Appearance: Normal appearance.  Cardiovascular:     Rate and Rhythm: Normal rate and regular rhythm.     Pulses: Normal pulses.     Heart sounds: Normal heart sounds.  Pulmonary:     Effort: Pulmonary effort is normal.     Breath sounds: Normal breath sounds.  Abdominal:     General: Abdomen is flat.     Palpations: Abdomen is soft.  Genitourinary:    Comments: Right testical to 4 CM size and painful. Neurological:     General: No focal deficit present.     Mental Status: He is alert and oriented to person, place, and time.     BP 140/70   Pulse 77   Temp 98 F (36.7 C)   Resp 17   Ht 6' (1.829 m)   Wt (!) 333 lb (151 kg)   SpO2 91%   BMI 45.16 kg/m  Wt Readings from Last 3 Encounters:  07/15/20 (!) 333 lb (151 kg)  06/28/20 (!) 342 lb 6.4 oz (155.3 kg)  05/31/20 (!) 342 lb (155.1 kg)    Health Maintenance Due  Topic Date Due  . Hepatitis C Screening  Never done  . OPHTHALMOLOGY EXAM  Never done  . TETANUS/TDAP  Never done  . INFLUENZA VACCINE  06/30/2020    There are no preventive care reminders to display for this patient.   No results found for: TSH Lab Results  Component Value Date   WBC 10.9 (H) 05/31/2020   HGB 15.0 05/31/2020   HCT 46.1 05/31/2020   MCV 90 05/31/2020   PLT 211 05/31/2020   Lab Results  Component Value Date   NA 140 05/31/2020   K 4.8 05/31/2020   CO2 24 05/31/2020   GLUCOSE 98  05/31/2020   BUN 17 05/31/2020   CREATININE 1.32 (H) 05/31/2020   BILITOT 0.2 05/31/2020   ALKPHOS 140 (H) 05/31/2020   AST 33 05/31/2020   ALT 22 05/31/2020   PROT 6.2 05/31/2020   ALBUMIN 3.6 (L) 05/31/2020   CALCIUM 10.0 05/31/2020   Lab Results  Component Value Date   CHOL 153 05/31/2020   Lab Results  Component Value Date   HDL 36 (L) 05/31/2020   Lab Results  Component Value Date   LDLCALC 87 05/31/2020   Lab Results  Component Value Date   TRIG 173 (H) 05/31/2020   Lab Results  Component Value Date   CHOLHDL 4.3 05/31/2020     Lab Results  Component Value Date   HGBA1C 12.0 (H) 05/31/2020       Assessment & Plan:  1. Testicular pain, right Patient has a severely swollen right testicle after sitting on it and probably is hemorrhaging into it since he is on eliquis.  Get stat ultrasound and if positive, he will need to go to ER and see urology   Pain is 8/10.       Follow-up: Return after test if not going to ER or at regular visit.  An After Visit Summary was printed and given to the patient.  [place] [phone]

## 2020-07-15 NOTE — Progress Notes (Signed)
This all sounds fine. Do I need to send rxs? kc

## 2020-07-16 NOTE — Progress Notes (Signed)
These medications are in the process of being obtained by Patient Assistance. No prescriptions needed at this time. Will keep you posted.   Thanks!

## 2020-07-18 ENCOUNTER — Ambulatory Visit (INDEPENDENT_AMBULATORY_CARE_PROVIDER_SITE_OTHER): Payer: Medicare HMO | Admitting: Legal Medicine

## 2020-07-18 ENCOUNTER — Encounter: Payer: Self-pay | Admitting: Legal Medicine

## 2020-07-18 ENCOUNTER — Other Ambulatory Visit: Payer: Self-pay

## 2020-07-18 VITALS — BP 140/70 | HR 70 | Temp 97.8°F | Resp 18 | Ht 72.0 in | Wt 335.0 lb

## 2020-07-18 DIAGNOSIS — N50811 Right testicular pain: Secondary | ICD-10-CM

## 2020-07-18 DIAGNOSIS — N452 Orchitis: Secondary | ICD-10-CM

## 2020-07-18 NOTE — Progress Notes (Signed)
Acute Office Visit  Subjective:    Patient ID: Shawn Sullivan, male    DOB: 10-Apr-1946, 74 y.o.   MRN: [id number]  Chief Complaint  Patient presents with  . Testicle Pain    HPI Patient is in today for follow up of right orchitis.  doppler showed fluid right testes.  He is using ice and elevation.  Patient medicine is helping.  The testicle is enlarging.  Past Medical History:  Diagnosis Date  . Anticoagulated 07/01/2018  . Anticoagulated 07/01/2018  . Benign essential hypertension 05/06/2013  . Chronic obstructive pulmonary disease ([place]) 11/05/2015  . COPD (chronic obstructive pulmonary disease) ([place]) 07/01/2018  . Dermatochalasis 12/16/2015  . Disorder of bursae and tendons in shoulder region 01/05/2014  . Generalized edema 06/22/2016  . Hyperlipidemia 12/16/2015  . Hypertensive heart disease 07/01/2018  . Low back pain 08/17/2013  . Lumbosacral spondylosis 05/06/2013  . Obstructive sleep apnea 07/01/2018  . Persistent atrial fibrillation ([place]) 07/01/2018  . Piriformis syndrome 05/03/2014  . Postlaminectomy syndrome, lumbar region 05/06/2013  . Presbyopia of both eyes 12/16/2015  . Restless legs syndrome 03/23/2016  . Restrictive lung disease 06/22/2016  . Sleep apnea 05/06/2013  . Type 2 diabetes mellitus without complications ([place]) 40/07/8118    Past Surgical History:  Procedure Laterality Date  . APPENDECTOMY    . BACK SURGERY    . CARDIOVERSION N/A 12/01/2018   Procedure: CARDIOVERSION;  Surgeon: Sanda Klein, MD;  Location: [facility];  Service: Cardiovascular;  Laterality: N/A;  . CATARACT EXTRACTION Bilateral   . LUMBAR FUSION    . NECK SURGERY      Family History  Problem Relation Age of Onset  . Atrial fibrillation Mother   . Cancer Mother   . Heart disease Father     Social History   Socioeconomic History  . Marital status: Married    Spouse name: Not on file  . Number of children: Not on file  . Years of education: Not on file  . Highest education level: Not on  file  Occupational History  . Not on file  Tobacco Use  . Smoking status: Former Smoker    Quit date: 1993    Years since quitting: 28.6  . Smokeless tobacco: Never Used  Vaping Use  . Vaping Use: Never used  Substance and Sexual Activity  . Alcohol use: Not Currently  . Drug use: Not Currently    Types: Hydrocodone  . Sexual activity: Not on file  Other Topics Concern  . Not on file  Social History Narrative  . Not on file   Social Determinants of Health   Financial Resource Strain:   . Difficulty of Paying Living Expenses: Not on file  Food Insecurity: No Food Insecurity  . Worried About Charity fundraiser in the Last Year: Never true  . Ran Out of Food in the Last Year: Never true  Transportation Needs:   . Lack of Transportation (Medical): Not on file  . Lack of Transportation (Non-Medical): Not on file  Physical Activity:   . Days of Exercise per Week: Not on file  . Minutes of Exercise per Session: Not on file  Stress:   . Feeling of Stress : Not on file  Social Connections:   . Frequency of Communication with Friends and Family: Not on file  . Frequency of Social Gatherings with Friends and Family: Not on file  . Attends Religious Services: Not on file  . Active Member of Clubs or Organizations: Not  on file  . Attends Archivist Meetings: Not on file  . Marital Status: Not on file  Intimate Partner Violence:   . Fear of Current or Ex-Partner: Not on file  . Emotionally Abused: Not on file  . Physically Abused: Not on file  . Sexually Abused: Not on file    Outpatient Medications Prior to Visit  Medication Sig Dispense Refill  . acetaminophen (TYLENOL) 500 MG tablet Take 1,000 mg by mouth every 6 (six) hours as needed for moderate pain or headache.    [name] amiodarone (PACERONE) 200 MG tablet Take 1 tablet (200 mg total) by mouth daily. (Patient taking differently: Take 200 mg by mouth every evening. ) 90 tablet 1  . apixaban (ELIQUIS) 5 MG TABS  tablet Take 5 mg by mouth 2 (two) times daily.     [name] b complex vitamins tablet Take 1 tablet by mouth daily.    . BD PEN NEEDLE MICRO U/F 32G X 6 MM MISC SMARTSIG:1 Needle SUB-Q Daily    . Catheters (FREEDOM CATH MALE EXT CATHETER) MISC 1 each by Does not apply route daily. 30 each 3  . cholecalciferol (VITAMIN D3) 25 MCG (1000 UT) tablet Take 1,000 Units by mouth daily.     [name] CINNAMON PO Take 700 mg by mouth daily.     . ciprofloxacin (CIPRO) 250 MG tablet Take 1 tablet (250 mg total) by mouth daily with breakfast. 14 tablet 0  . Cyanocobalamin (B-12) 5000 MCG CAPS Take 5,000 mcg by mouth daily.     . Fluticasone-Umeclidin-Vilant (TRELEGY ELLIPTA) 100-62.5-25 MCG/INH AEPB Inhale 1 puff into the lungs daily. 1 each 2  . folic acid (FOLVITE) 008 MCG tablet Take 800 mcg by mouth daily.    [name] glipiZIDE (GLUCOTROL) 10 MG tablet TAKE 1 TABLET BY MOUTH TWICE DAILY BEFORE A MEAL 180 tablet 0  . HYDROcodone-acetaminophen (NORCO) 10-325 MG tablet Take 1 tablet by mouth every 4 (four) hours as needed for moderate pain.     . Insulin Glargine-Lixisenatide (SOLIQUA) 100-33 UNT-MCG/ML SOPN Inject 55 Units into the skin daily.     [name] ipratropium-albuterol (DUONEB) 0.5-2.5 (3) MG/3ML SOLN Take 3 mLs by nebulization every 6 (six) hours as needed (shortness of breath).     [name] levocetirizine (XYZAL) 5 MG tablet Take 5 mg by mouth daily as needed for allergies.    . magnesium chloride (SLOW-MAG) 64 MG TBEC SR tablet Take 1 tablet (64 mg total) by mouth 2 (two) times daily. (Patient taking differently: Take 1 tablet by mouth daily. ) 60 tablet 3  . metolazone (ZAROXOLYN) 5 MG tablet Take 5 mg by mouth once a week. Uses prn if swollen once weekly.    . metoprolol tartrate (LOPRESSOR) 50 MG tablet Take 1 tablet (50 mg total) by mouth every 6 (six) hours. (Patient taking differently: Take 50 mg by mouth in the morning, at noon, and at bedtime. ) 360 tablet 2  . montelukast (SINGULAIR) 10 MG tablet Take 1 tablet (10 mg  total) by mouth every evening. 90 tablet 3  . Omega-3 Fatty Acids (OMEGA-3 FISH OIL CONCENTRATE) 1000 MG CPDR Take 2,000 mg by mouth 2 (two) times a day.    . Omeprazole 20 MG TBEC Take 20 mg by mouth 2 (two) times daily before a meal.     . potassium chloride SA (K-DUR) 20 MEQ tablet Take 20 mEq by mouth 2 (two) times daily.    [name] pyridOXINE (VITAMIN B-6) 100 MG tablet Take 100 mg  by mouth daily.     [name] rOPINIRole (REQUIP) 0.5 MG tablet Take 1 tablet (0.5 mg total) by mouth at bedtime. (Patient taking differently: Take 0.5 mg by mouth at bedtime. ) 30 tablet 2  . rosuvastatin (CRESTOR) 20 MG tablet Take 1 tablet (20 mg total) by mouth daily. (Patient taking differently: Take 20 mg by mouth at bedtime. ) 90 tablet 1  . sertraline (ZOLOFT) 50 MG tablet Take 1 tablet by mouth twice daily 180 tablet 0  . torsemide (DEMADEX) 20 MG tablet Take 1 tablet (20 mg total) by mouth 2 (two) times daily. Take an extra tablet (20 mg) in the afternoon on Monday, Wednesday, Friday. (Patient taking differently: Take 40 mg by mouth 2 (two) times daily. 2 in the am and 2 at noon) 72 tablet 3  . VENTOLIN HFA 108 (90 Base) MCG/ACT inhaler Inhale 2 puffs into the lungs every 4 (four) hours as needed for wheezing or shortness of breath. 18 g 11  . tamsulosin (FLOMAX) 0.4 MG CAPS capsule TAKE 1 CAPSULE BY MOUTH BEFORE BEDTIME 90 capsule 0   No facility-administered medications prior to visit.    Allergies  Allergen Reactions  . Androgel [Testosterone] Other (See Comments)    Pedal edema   . Biaxin [Clarithromycin] Other (See Comments)    Taste perception alteration  . Duloxetine Other (See Comments)    Hallucinations  . Gabapentin Other (See Comments)    hallucinations  . Ibuprofen Other (See Comments)    GI upset   . Lyrica [Pregabalin] Swelling  . Spironolactone Other (See Comments)    Review of Systems  Constitutional: Negative.   HENT: Negative.   Eyes: Negative.   Respiratory: Negative.     Cardiovascular: Negative.   Genitourinary: Positive for testicular pain.       Objective:    Physical Exam Vitals reviewed.  Constitutional:      Appearance: Normal appearance.  HENT:     Head: Normocephalic and atraumatic.     Right Ear: Tympanic membrane normal.     Left Ear: Tympanic membrane normal.  Cardiovascular:     Rate and Rhythm: Normal rate and regular rhythm.     Pulses: Normal pulses.     Heart sounds: Normal heart sounds.  Pulmonary:     Effort: Pulmonary effort is normal.     Breath sounds: Normal breath sounds.  Genitourinary:    Testes:        Right: Mass, tenderness and swelling present.       Comments: Right tested now 12 by 7 cm and firm, larger than last time.  I suspect bleeding into tests. Neurological:     Mental Status: He is alert.     BP 140/70   Pulse 70   Temp 97.8 F (36.6 C)   Resp 18   Ht 6' (1.829 m)   Wt (!) 335 lb (152 kg)   BMI 45.43 kg/m  Wt Readings from Last 3 Encounters:  07/18/20 (!) 335 lb (152 kg)  07/15/20 (!) 333 lb (151 kg)  06/28/20 (!) 342 lb 6.4 oz (155.3 kg)    Health Maintenance Due  Topic Date Due  . Hepatitis C Screening  Never done  . OPHTHALMOLOGY EXAM  Never done  . TETANUS/TDAP  Never done  . INFLUENZA VACCINE  06/30/2020    There are no preventive care reminders to display for this patient.   No results found for: TSH Lab Results  Component Value Date   WBC 10.9 (  H) 05/31/2020   HGB 15.0 05/31/2020   HCT 46.1 05/31/2020   MCV 90 05/31/2020   PLT 211 05/31/2020   Lab Results  Component Value Date   NA 140 05/31/2020   K 4.8 05/31/2020   CO2 24 05/31/2020   GLUCOSE 98 05/31/2020   BUN 17 05/31/2020   CREATININE 1.32 (H) 05/31/2020   BILITOT 0.2 05/31/2020   ALKPHOS 140 (H) 05/31/2020   AST 33 05/31/2020   ALT 22 05/31/2020   PROT 6.2 05/31/2020   ALBUMIN 3.6 (L) 05/31/2020   CALCIUM 10.0 05/31/2020   Lab Results  Component Value Date   CHOL 153 05/31/2020   Lab Results   Component Value Date   HDL 36 (L) 05/31/2020   Lab Results  Component Value Date   LDLCALC 87 05/31/2020   Lab Results  Component Value Date   TRIG 173 (H) 05/31/2020   Lab Results  Component Value Date   CHOLHDL 4.3 05/31/2020   Lab Results  Component Value Date   HGBA1C 12.0 (H) 05/31/2020       Assessment & Plan:  1. Testicular pain, right I discussed the case with Dr. Nila Nephew and He recommended I send to ER for repeat ultrasounds since testicle has enlarged.  This was explained to patient and ER called to notify them. 2. Orchitis Continued pain and progressive swelling right tests despite ice and cipro.  He is getting some moputh sores also.         Follow-up: No follow-ups on file.  An After Visit Summary was printed and given to the patient.  [place] [phone]

## 2020-07-20 LAB — RENAL FUNCTION PANEL
Albumin: 3.7 g/dL (ref 3.7–4.7)
BUN/Creatinine Ratio: 13 (ref 10–24)
BUN: 16 mg/dL (ref 8–27)
CO2: 25 mmol/L (ref 20–29)
Calcium: 10 mg/dL (ref 8.6–10.2)
Chloride: 98 mmol/L (ref 96–106)
Creatinine, Ser: 1.24 mg/dL (ref 0.76–1.27)
GFR calc Af Amer: 65 mL/min/{1.73_m2} (ref >59)
GFR calc non Af Amer: 56 mL/min/{1.73_m2} — ABNORMAL LOW (ref >59)
Glucose: 234 mg/dL — ABNORMAL HIGH (ref 65–99)
Phosphorus: 2.2 mg/dL — ABNORMAL LOW (ref 2.8–4.1)
Potassium: 4.4 mmol/L (ref 3.5–5.2)
Sodium: 138 mmol/L (ref 134–144)

## 2020-07-20 LAB — IRON AND TIBC
Iron Saturation: 16 % (ref 15–55)
Iron: 44 ug/dL (ref 38–169)
Total Iron Binding Capacity: 278 ug/dL (ref 250–450)
UIBC: 234 ug/dL (ref 111–343)

## 2020-07-20 LAB — CBC WITH DIFFERENTIAL/PLATELET
Basophils Absolute: 0.1 10*3/uL (ref 0.0–0.2)
Basos: 1 %
EOS (ABSOLUTE): 0.2 10*3/uL (ref 0.0–0.4)
Eos: 2 %
Hematocrit: 44.9 % (ref 37.5–51.0)
Hemoglobin: 14.2 g/dL (ref 13.0–17.7)
Immature Grans (Abs): 0 10*3/uL (ref 0.0–0.1)
Immature Granulocytes: 0 %
Lymphocytes Absolute: 1.7 10*3/uL (ref 0.7–3.1)
Lymphs: 15 %
MCH: 28.6 pg (ref 26.6–33.0)
MCHC: 31.6 g/dL (ref 31.5–35.7)
MCV: 90 fL (ref 79–97)
Monocytes Absolute: 0.6 10*3/uL (ref 0.1–0.9)
Monocytes: 6 %
Neutrophils Absolute: 8.6 10*3/uL — ABNORMAL HIGH (ref 1.4–7.0)
Neutrophils: 76 %
Platelets: 308 10*3/uL (ref 150–450)
RBC: 4.97 x10E6/uL (ref 4.14–5.80)
RDW: 13.4 % (ref 11.6–15.4)
WBC: 11.3 10*3/uL — ABNORMAL HIGH (ref 3.4–10.8)

## 2020-07-20 LAB — VITAMIN D 25 HYDROXY (VIT D DEFICIENCY, FRACTURES): Vit D, 25-Hydroxy: 29.5 ng/mL — ABNORMAL LOW (ref 30.0–100.0)

## 2020-07-20 LAB — PARATHYROID HORMONE, INTACT (NO CA): PTH: 69 pg/mL — ABNORMAL HIGH (ref 15–65)

## 2020-07-20 LAB — FERRITIN: Ferritin: 175 ng/mL (ref 30–400)

## 2020-07-29 ENCOUNTER — Telehealth: Payer: Self-pay

## 2020-07-29 NOTE — Progress Notes (Signed)
The following medications were approved via patient assistance:   Breztri - approved 07/16/20-11/29/2020 from [employer]. Shipped to patient's home estimated arrival 07/29/2020. Renewal can be completed via the phone December 2021.   [name] and Ozempic approved 07/29/2020 - 11/29/2020 from [employer]. Renewal can be submitted in January 2022.   Sherre Poot, PharmD, [facility] Clinical Pharmacist [facility] [phone] (office) [phone] (mobile)

## 2020-08-12 DIAGNOSIS — M533 Sacrococcygeal disorders, not elsewhere classified: Secondary | ICD-10-CM | POA: Insufficient documentation

## 2020-08-12 HISTORY — DX: Sacrococcygeal disorders, not elsewhere classified: M53.3

## 2020-08-14 NOTE — Chronic Care Management (AMB) (Signed)
Chronic Care Management Pharmacy  Name: Shawn Sullivan  MRN: [id number] DOB: October 13, 1946  Chief Complaint/ HPI  Shawn Sullivan,  74 y.o. , male presents for their Follow-Up CCM visit with the clinical pharmacist via telephone due to COVID-19 Pandemic.  PCP : Rochel Brome, MD  Their chronic conditions include: afib, hypertensive heart and kidney disease, chronic diastolic congestive heart failure, ckd stage 3, COPD, OSA, GERD, Type 2 DM, restless legs syndrome, polyneuropathy, hyperlipidemia, depressive disorder, chronic pain, anxiety.   Office Visits: 05/31/2020 - trial of ropinirole for leg pain/anxiety at night.  04/11/2020 - balanitis with urinary incontinence. Ordered condom catheter, fluconazole 100 mg, clotrimazole.  02/23/2020 - continue current medications.  Consult Visit: 06/28/2020 - Cardiology visit - stable rhythm continue amiodarone. Heart failure compensated. Needs full thyroid panel and proBNP level at next labs.  05/15/2020 - Pain management - telemed visit. Refill opioid medications.  02/13/2020 - Pain management - telemed visit. Refill opioid medications.   Medications: Outpatient Encounter Medications as of 08/15/2020  Medication Sig  . acetaminophen (TYLENOL) 500 MG tablet Take 1,000 mg by mouth every 6 (six) hours as needed for moderate pain or headache.  [name] amiodarone (PACERONE) 200 MG tablet Take 1 tablet (200 mg total) by mouth daily. (Patient taking differently: Take 200 mg by mouth every evening. )  . apixaban (ELIQUIS) 5 MG TABS tablet Take 5 mg by mouth 2 (two) times daily.   [name] b complex vitamins tablet Take 1 tablet by mouth daily.  . BD PEN NEEDLE MICRO U/F 32G X 6 MM MISC SMARTSIG:1 Needle SUB-Q Daily  . Catheters (FREEDOM CATH MALE EXT CATHETER) MISC 1 each by Does not apply route daily.  . cholecalciferol (VITAMIN D3) 25 MCG (1000 UT) tablet Take 1,000 Units by mouth daily.   [name] CINNAMON PO Take 700 mg by mouth daily.   . ciprofloxacin (CIPRO)  250 MG tablet Take 1 tablet (250 mg total) by mouth daily with breakfast.  . Cyanocobalamin (B-12) 5000 MCG CAPS Take 5,000 mcg by mouth daily.   . Fluticasone-Umeclidin-Vilant (TRELEGY ELLIPTA) 100-62.5-25 MCG/INH AEPB Inhale 1 puff into the lungs daily.  . folic acid (FOLVITE) 542 MCG tablet Take 800 mcg by mouth daily.  [name] glipiZIDE (GLUCOTROL) 10 MG tablet TAKE 1 TABLET BY MOUTH TWICE DAILY BEFORE A MEAL  . HYDROcodone-acetaminophen (NORCO) 10-325 MG tablet Take 1 tablet by mouth every 4 (four) hours as needed for moderate pain.   . Insulin Glargine-Lixisenatide (SOLIQUA) 100-33 UNT-MCG/ML SOPN Inject 55 Units into the skin daily.   [name] ipratropium-albuterol (DUONEB) 0.5-2.5 (3) MG/3ML SOLN Take 3 mLs by nebulization every 6 (six) hours as needed (shortness of breath).   [name] levocetirizine (XYZAL) 5 MG tablet Take 5 mg by mouth daily as needed for allergies.  . magnesium chloride (SLOW-MAG) 64 MG TBEC SR tablet Take 1 tablet (64 mg total) by mouth 2 (two) times daily. (Patient taking differently: Take 1 tablet by mouth daily. )  . metolazone (ZAROXOLYN) 5 MG tablet Take 5 mg by mouth once a week. Uses prn if swollen once weekly.  . metoprolol tartrate (LOPRESSOR) 50 MG tablet Take 1 tablet (50 mg total) by mouth every 6 (six) hours. (Patient taking differently: Take 50 mg by mouth in the morning, at noon, and at bedtime. )  . montelukast (SINGULAIR) 10 MG tablet Take 1 tablet (10 mg total) by mouth every evening.  . Omega-3 Fatty Acids (OMEGA-3 FISH OIL CONCENTRATE) 1000 MG CPDR Take 2,000 mg by mouth  2 (two) times a day.  . Omeprazole 20 MG TBEC Take 20 mg by mouth 2 (two) times daily before a meal.   . potassium chloride SA (K-DUR) 20 MEQ tablet Take 20 mEq by mouth 2 (two) times daily.  [name] pyridOXINE (VITAMIN B-6) 100 MG tablet Take 100 mg by mouth daily.   [name] rOPINIRole (REQUIP) 0.5 MG tablet Take 1 tablet (0.5 mg total) by mouth at bedtime. (Patient taking differently: Take 0.5 mg by mouth at  bedtime. )  . rosuvastatin (CRESTOR) 20 MG tablet Take 1 tablet (20 mg total) by mouth daily. (Patient taking differently: Take 20 mg by mouth at bedtime. )  . sertraline (ZOLOFT) 50 MG tablet Take 1 tablet by mouth twice daily  . tamsulosin (FLOMAX) 0.4 MG CAPS capsule TAKE 1 CAPSULE BY MOUTH BEFORE BEDTIME  . torsemide (DEMADEX) 20 MG tablet Take 1 tablet (20 mg total) by mouth 2 (two) times daily. Take an extra tablet (20 mg) in the afternoon on Monday, Wednesday, Friday. (Patient taking differently: Take 40 mg by mouth 2 (two) times daily. 2 in the am and 2 at noon)  . VENTOLIN HFA 108 (90 Base) MCG/ACT inhaler Inhale 2 puffs into the lungs every 4 (four) hours as needed for wheezing or shortness of breath.   No facility-administered encounter medications on file as of 08/15/2020.   Allergies  Allergen Reactions  . Androgel [Testosterone] Other (See Comments)    Pedal edema   . Biaxin [Clarithromycin] Other (See Comments)    Taste perception alteration  . Duloxetine Other (See Comments)    Hallucinations  . Gabapentin Other (See Comments)    hallucinations  . Ibuprofen Other (See Comments)    GI upset   . Lyrica [Pregabalin] Swelling  . Spironolactone Other (See Comments)   SDOH Screenings   Alcohol Screen:   . Last Alcohol Screening Score (AUDIT): Not on file  Depression (PHQ2-9):   . PHQ-2 Score: Not on file  Financial Resource Strain:   . Difficulty of Paying Living Expenses: Not on file  Food Insecurity: No Food Insecurity  . Worried About Charity fundraiser in the Last Year: Never true  . Ran Out of Food in the Last Year: Never true  Housing: Low Risk   . Last Housing Risk Score: 0  Physical Activity:   . Days of Exercise per Week: Not on file  . Minutes of Exercise per Session: Not on file  Social Connections:   . Frequency of Communication with Friends and Family: Not on file  . Frequency of Social Gatherings with Friends and Family: Not on file  . Attends  Religious Services: Not on file  . Active Member of Clubs or Organizations: Not on file  . Attends Archivist Meetings: Not on file  . Marital Status: Not on file  Stress:   . Feeling of Stress : Not on file  Tobacco Use: Medium Risk  . Smoking Tobacco Use: Former Smoker  . Smokeless Tobacco Use: Never Used  Transportation Needs:   . Film/video editor (Medical): Not on file  . Lack of Transportation (Non-Medical): Not on file     Current Diagnosis/Assessment:  Goals Addressed            This Visit's Progress   . Pharmacy Care Plan       CARE PLAN ENTRY (see longitudinal plan of care for additional care plan information)  Current Barriers:  . Chronic Disease Management support, education, and care coordination  needs related to Hypertension, Hyperlipidemia, Diabetes, and COPD   Hypertension BP Readings from Last 3 Encounters:  06/28/20 (!) 138/72  05/31/20 132/70  04/11/20 (!) 170/80   . Pharmacist Clinical Goal(s): o Over the next 90 days, patient will work with PharmD and providers to achieve BP goal <130/80 . Current regimen:  o Metoprolol 50 mg three times daily o Torsemide 40 mg twice daily o Metolazone 5 mg once weekly as needed for swelling . Interventions: o Discussed benefits of exercise. Increase activity as tolerated to goal of 150 minutes each week beginning with 5-10 minutes each day.  . Patient self care activities - Over the next 90 days, patient will: o Check BP weekly, document, and provide at future appointments o Ensure daily salt intake < 2300 mg/day  Hyperlipidemia Lab Results  Component Value Date/Time   LDLCALC 87 05/31/2020 10:48 AM   . Pharmacist Clinical Goal(s): o Over the next 90 days, patient will work with PharmD and providers to achieve LDL goal < 70 . Current regimen:  o Rosuvastatin 20 mg daily o Omega-3 Fish Oil 2000 mg twice daily  . Interventions: o Recommend patient incorporate daily exercise with goal of 150  minutes. o Continue work on [employer] of lean meats, vegetables and fruit and whole grains in moderation.   o Continue taking medication as prescribed.  . Patient self care activities - Over the next 90 days, patient will: o Take medication as prescribed.  o Contact pharmacist or provider with any questions.   Diabetes Lab Results  Component Value Date/Time   HGBA1C 12.0 (H) 05/31/2020 10:48 AM   HGBA1C 9.0 (H) 02/23/2020 10:56 AM   . Pharmacist Clinical Goal(s): o Over the next 90 days, patient will work with PharmD and providers to achieve A1c goal <7% . Current regimen:  o Recommend increasing Ozempic to 0.5 mg weekly for 4 weeks/doses then increase to 1 mg weekly.  o Tresiba 50 units daily.  . Interventions: o Reviewed home blood sugar readings.  o Discussed current Diabetes regimen. Recommended discontinuing glipizide by mouth. Recommend changing insulin and injection regimen to Trulicity 2.68 mg weekly x 4 weeks then increase to 0.5 mg weekly for 4 weeks then increase 1 mg weekly.  o Coordinating patient assistance for [place] and Ozempic 1 mg weekly. Patient has been approved but medication has not shipped.  o Discussed appropriate ways to correct low blood sugar.  o Discussed benefits of optimal blood sugar control. . Patient self care activities - Over the next 90 days, patient will: o Check blood sugar once daily, document, and provide at future appointments o Contact provider with any episodes of hypoglycemia  COPD . Pharmacist Clinical Goal(s) o Over the next 90 days, patient will work with PharmD and providers to improve adherence with maintenance regimen.  . Current regimen:  o Breztri 2 puffs twice daily o Montelukast 10 mg every evening o Ventolin 2 puffs every 4 hours prn shortness of breath or wheezing . Interventions: o Breztri approved through patient assistance.  o Recommend using maintenance inhaler every day to prevent use of rescue inhaler or  exacerbations.  . Patient self care activities - Over the next 90 days, patient will: o Use maintenance inhaler daily.  o Complete Patient Assistance for Breztri to improve access to care.   Medication management . Pharmacist Clinical Goal(s): o Over the next 90 days, patient will work with PharmD and providers to achieve optimal medication adherence . Current pharmacy: [facility] .  Interventions o Comprehensive medication review performed. o Utilize [facility] for medication synchronization, packaging and delivery . Patient self care activities - Over the next 90 days, patient will: o Focus on medication adherence by continuing to use pill box.  o Take medications as prescribed o Report any questions or concerns to PharmD and/or provider(s)  Please see past updates related to this goal by clicking on the "Past Updates" button in the selected goal         AFIB   Patient is currently rhythm controlled. HR 77 BPM  Patient has failed these meds in past: n/a Patient is currently controlled on the following medications:   Amiodarone 200 mg daily  Eliquis 5 mg bid  We discussed:  Everything looked good at last Cardiology visit. Echo scheduled for a few weeks out. Eliquis is very expensive for patient. MD has been providing samples. Pharmacist assisting with patient assistance application at this time.   Update 08/15/2020 - Patient is receiving Eliquis through patient assistance. Pharmacist will coordinate renewal of medication for 2022.   Plan  Continue current medications  ,  COPD / Asthma / Tobacco   Eosinophil count:  No results found for: EOSPCT%                               Eos (Absolute):  Lab Results  Component Value Date/Time   EOSABS 0.2 07/18/2020 02:32 PM    Tobacco Status:  Social History   Tobacco Use  Smoking Status Former Smoker  . Quit date: 56  . Years since quitting: 28.7  Smokeless Tobacco Never Used    Patient has failed  these meds in past: Breo ellipta Patient is currently uncontrolled on the following medications:   Trelegy 100-62.5-25 1 puff into the lungs daily  Duoneb every 6 hours prn shortness of breath  Levoceterizine 5 mg daily prn allergies  Montelukast 10 mg daily every evening  Ventolin 2 puffs every 4 hours prn shortness of breath wheezing   Using maintenance inhaler regularly? Yes Frequency of rescue inhaler use:  1-2x per week  We discussed:  proper inhaler technique. Patient has difficulty affording Trelegy with medicare plan. Patient sometimes skips doses if low on samples of Trelegy due to cost. Pharmacist and daughter discussed patient assistance for Trelegy or Breztri. Patient has to use rescue inhaler more often when spreading out doses of Trelegy. Patient's daughter understands the importance of daily use of maintenance inhaler. Patient uses levoceterizine during spring and fall allergy season. Consider changing to [facility] due to patient assistance coverage. Patient is unsure that he has spent $600 to be eligible for Trelegy.   Update 08/15/2020 - Patient has received Breztri through patient assistance. Approval is through 11/29/2020. Pharmacist will work to renew application for [id number].   Plan  Continue current medications.  Diabetes   Recent Relevant Labs: Lab Results  Component Value Date/Time   HGBA1C 12.0 (H) 05/31/2020 10:48 AM   HGBA1C 9.0 (H) 02/23/2020 10:56 AM   MICROALBUR 150 05/31/2020 11:09 AM     Kidney Function Lab Results  Component Value Date/Time   CREATININE 1.24 07/18/2020 02:32 PM   CREATININE 1.32 (H) 05/31/2020 10:48 AM   GFRNONAA 56 (L) 07/18/2020 02:32 PM   GFRAA 65 07/18/2020 02:32 PM   K 4.4 07/18/2020 02:32 PM   K 4.8 05/31/2020 10:48 AM    Checking BG: Daily  Recent FBG Readings: 300-400s  Patient has failed these  meds in past: Tresiba, Victoza, metformin Patient is currently uncontrolled on the following medications:   Glipizide 10  mg bid before a meal  Tresiba 50 units   Ozempic 0.5 mg weekly  BD Pen needle [id number] use with TTyler Aas Last diabetic Foot exam: July 2021 Last diabetic Eye exam: Daughter uncertain of exact date but has been completed in the last year.   We discussed: diet and exercise extensively. Patient enjoys garden vegetables. Daughter reports that he often overcorrects low blood sugar with cookies or more than 1/2 cup of orange juice. Patient has not had low blood sugar lately and has been running in the 300-400s. Daughter is encouraging healthier lifestyle choices with food and exercise. Hasn't felt well lately but does have a pedal exerciser that he could use. Patient's daughter frequently reminds him of sugar sources in his diet.   Update 08/14/2020 - Blood sugars are <200 mg/dL. Patient is still using samples of TTyler Aasdue to patient assistance program delay in [place] Pharmacist contacting program to check the shipping status. Medication should arrive to office by Tuesday of next week per [employer].   Plan   Recommend begin Ozempic to 0.5 mg weekly for 1 month to increase to ultimate goal 1 mg weekly if tolerated.   Heart Failure managed by Cardiology   Type: Diastolic  Last ejection fraction: hydralazine  Office blood pressures are  BP Readings from Last 3 Encounters:  06/28/20 (!) 138/72  05/31/20 132/70  04/11/20 (!) 170/80    Patient has failed these meds in past: carvedilol, furosemide, hydralazine Patient is currently controlled on the following medications:   Metolazone 5 mg once weekly prn  Torsemide 40 mg twice daily  Metoprolol 50 mg tid  We discussed diet and exercise extensively. Patient's daughter prepares some of his meals and will bake/broil chicken with vegetables. She limits his sources of carbohydrates/fruits with meals. Works to restrict sodium to help swelling.   Plan  Continue current medications  Hyperlipidemia   LDL goal <  70  Lipid Panel     Component Value Date/Time   CHOL 153 05/31/2020 1048   TRIG 173 (H) 05/31/2020 1048   HDL 36 (L) 05/31/2020 1048   LDLCALC 87 05/31/2020 1048    Hepatic Function Latest Ref Rng & Units 07/18/2020 05/31/2020 03/19/2020  Total Protein 6.0 - 8.5 g/dL - 6.2 6.6  Albumin 3.7 - 4.7 g/dL 3.7 3.6(L) 4.3  AST 0 - 40 IU/L - 33 44(H)  ALT 0 - 44 IU/L - 22 36  Alk Phosphatase 48 - 121 IU/L - 140(H) 129(H)  Total Bilirubin 0.0 - 1.2 mg/dL - 0.2 1.0     The 10-year ASCVD risk score (Mikey BussingDC Jr., et al., 2013) is: 40.6%   Values used to calculate the score:     Age: [age]years     Sex: Male     Is Non-Hispanic African American: No     Diabetic: Yes     Tobacco smoker: No     Systolic Blood Pressure: 1354mmHg     Is BP treated: Yes     HDL Cholesterol: 36 mg/dL     Total Cholesterol: 153 mg/dL   Patient has failed these meds in past: atorvastatin Patient is currently uncontrolled on the following medications:  . Rosuvastatin 20 mg daily . Omega-3 2000 mg bid  We discussed:  diet and exercise extensively. Discussed patient's goal of not having to increase statin at this time. Patient encouraged  to improve glucose control, healthy diet and exercise to avoid increased dose. Discussed the role of blood sugar in elevated triglycerides. Patient sometimes skips fish oil due to difficulty swallowing.   Update 08/15/2020 - Consider revisiting increase of cholesterol medication after next blood work.   Plan  Continue current medications   Chronic Pain   Patient has failed these meds in past: none reported Patient is currently controlled on the following medications:  . Acetaminophen 1000 mg every 6 hours prn pain or headache . Hydrocodone 10-325 mg every 4 hours as needed for moderate pain  We discussed:  Patient's daughter is aware of acetaminophen daily intake. Patient knows not to use hydrocodone-acetaminophen with OTC acetaminophen. Daughter monitors intake of both.    Plan  Continue current medications  Depression and Anxiety   Patient has failed these meds in past: none reported Patient is currently controlled on the following medications:  . Sertraline 50 mg bid  We discussed:  Daughter reports that patient is stable on current dose. She states that he is frustrated by what he can no longer do due to health. Daughter is encouraging him to begin exercising and consider physical therapy to improve. Patient has a boat and enjoys getting out on lake when he feels good. Patient sometimes sleeps more during the day if not resting well at night. Still drives some but daughter drives most of the time due to neuropathy. Daughter states that patient has felt bad in the last week but attributes it to elevated blood sugar. She will continue to monitor and contact provider or pharmacist if treatment regimen adjustment needed.   Plan  Continue current medications   GERD   Patient has failed these meds in past: nore reported Patient is currently controlled on the following medications:  . Omeprazole 20 mg OTC bid before a meal  We discussed:  Patient's daughter has been purchasing OTC due to cost of prescription. Patient symptoms well controlled at this time. Discussed healthier eating, limited portions and weight loss.   Plan  Continue current medications   BPH   Patient has failed these meds in past: none reported  Patient is currently controlled on the following medications:  . tamsulosin 0.4 mg before bedtime  We discussed:  Patient's medication affordable at this time.   Plan  Continue current medications  Restless Leg Syndrome   Patient has failed these meds in past: none reported Patient is currently controlled on the following medications:  . Ropinirole 0.5 mg at bedtime  We discussed:  Patient is new to treatment but reports that his legs are improved and allowing him to rest better.   Plan  Continue current  medications     Health Maintenance   Patient is currently controlled on the following medications:  . Folic acid 109 mcg daily - supplementation . Cinnamon 700 mg daily - supplementation . Vitamin D3 1000 unit daily - supplementation . Magnesium 64 mg daily - supplementation . Potassium chloride 20 meq bid - low potassium . B-complex daily - supplementation.    We discussed:  Patient's daughter will tweak potassium dosing as needed each week. If he take the metolazone she will often give him an extra potassium on those days. Nephrology has  Approved her adding an extra potassium on days he uses metolazone.   Plan  Continue current medications  Vaccines   Reviewed and discussed patient's vaccination history.    Immunization History  Administered Date(s) Administered  . Influenza-Unspecified 08/16/2019  . Moderna  SARS-COVID-2 Vaccination 12/22/2019, 01/19/2020  . Pneumococcal Conjugate-13 07/26/2014  . Pneumococcal Polysaccharide-23 08/19/2012    Plan  Recommended patient receive annual flu vaccine in office.   Medication Management   Pt uses [facility] for all medications Uses pill box? Yes Pt endorses fair compliance  We discussed: Affordability limits some of patient's compliance. Daughter prepares pill boxes every 2 weeks and checks in daily. Daughter often administers insulin shot for patient.   Verbal consent obtained for [facility] enhanced pharmacy services (medication synchronization, adherence packaging, delivery coordination). A medication sync plan was created to allow patient to get all medications delivered once every 30 to 90 days per patient preference. Patient understands they have freedom to choose pharmacy and clinical pharmacist will coordinate care between all prescribers and [facility].  Plan  Utilize [facility] for medication synchronization, packaging and delivery    Follow up: 1 month phone visit

## 2020-08-15 ENCOUNTER — Ambulatory Visit: Payer: Medicare HMO

## 2020-08-15 DIAGNOSIS — N183 Chronic kidney disease, stage 3 unspecified: Secondary | ICD-10-CM

## 2020-08-15 DIAGNOSIS — E782 Mixed hyperlipidemia: Secondary | ICD-10-CM

## 2020-08-15 DIAGNOSIS — I5032 Chronic diastolic (congestive) heart failure: Secondary | ICD-10-CM

## 2020-08-15 DIAGNOSIS — Z794 Long term (current) use of insulin: Secondary | ICD-10-CM

## 2020-08-15 DIAGNOSIS — E0842 Diabetes mellitus due to underlying condition with diabetic polyneuropathy: Secondary | ICD-10-CM

## 2020-08-15 NOTE — Patient Instructions (Addendum)
Visit Information  Goals Addressed            This Visit's Progress   . Pharmacy Care Plan       CARE PLAN ENTRY (see longitudinal plan of care for additional care plan information)  Current Barriers:  . Chronic Disease Management support, education, and care coordination needs related to Hypertension, Hyperlipidemia, Diabetes, and COPD   Hypertension BP Readings from Last 3 Encounters:  06/28/20 (!) 138/72  05/31/20 132/70  04/11/20 (!) 170/80   . Pharmacist Clinical Goal(s): o Over the next 90 days, patient will work with PharmD and providers to achieve BP goal <130/80 . Current regimen:  o Metoprolol 50 mg three times daily o Torsemide 40 mg twice daily o Metolazone 5 mg once weekly as needed for swelling . Interventions: o Discussed benefits of exercise. Increase activity as tolerated to goal of 150 minutes each week beginning with 5-10 minutes each day.  . Patient self care activities - Over the next 90 days, patient will: o Check BP weekly, document, and provide at future appointments o Ensure daily salt intake < 2300 mg/day  Hyperlipidemia Lab Results  Component Value Date/Time   LDLCALC 87 05/31/2020 10:48 AM   . Pharmacist Clinical Goal(s): o Over the next 90 days, patient will work with PharmD and providers to achieve LDL goal < 70 . Current regimen:  o Rosuvastatin 20 mg daily o Omega-3 Fish Oil 2000 mg twice daily  . Interventions: o Recommend patient incorporate daily exercise with goal of 150 minutes. o Continue work on [employer] of lean meats, vegetables and fruit and whole grains in moderation.   o Continue taking medication as prescribed.  . Patient self care activities - Over the next 90 days, patient will: o Take medication as prescribed.  o Contact pharmacist or provider with any questions.   Diabetes Lab Results  Component Value Date/Time   HGBA1C 12.0 (H) 05/31/2020 10:48 AM   HGBA1C 9.0 (H) 02/23/2020 10:56 AM   . Pharmacist Clinical  Goal(s): o Over the next 90 days, patient will work with PharmD and providers to achieve A1c goal <7% . Current regimen:  o Recommend increasing Ozempic to 0.5 mg weekly for 4 weeks/doses then increase to 1 mg weekly.  o Tresiba 50 units daily.  . Interventions: o Reviewed home blood sugar readings.  o Discussed current Diabetes regimen. Recommended discontinuing glipizide by mouth. Recommend changing insulin and injection regimen to Trulicity 2.84 mg weekly x 4 weeks then increase to 0.5 mg weekly for 4 weeks then increase 1 mg weekly.  o Coordinating patient assistance for [place] and Ozempic 1 mg weekly. Patient has been approved but medication has not shipped.  o Discussed appropriate ways to correct low blood sugar.  o Discussed benefits of optimal blood sugar control. . Patient self care activities - Over the next 90 days, patient will: o Check blood sugar once daily, document, and provide at future appointments o Contact provider with any episodes of hypoglycemia  COPD . Pharmacist Clinical Goal(s) o Over the next 90 days, patient will work with PharmD and providers to improve adherence with maintenance regimen.  . Current regimen:  o Breztri 2 puffs twice daily o Montelukast 10 mg every evening o Ventolin 2 puffs every 4 hours prn shortness of breath or wheezing . Interventions: o Breztri approved through patient assistance.  o Recommend using maintenance inhaler every day to prevent use of rescue inhaler or exacerbations.  . Patient self care activities -  Over the next 90 days, patient will: o Use maintenance inhaler daily.  o Complete Patient Assistance for Breztri to improve access to care.   Medication management . Pharmacist Clinical Goal(s): o Over the next 90 days, patient will work with PharmD and providers to achieve optimal medication adherence . Current pharmacy: [facility] . Interventions o Comprehensive medication review performed. o Utilize [facility] for medication synchronization, packaging and delivery . Patient self care activities - Over the next 90 days, patient will: o Focus on medication adherence by continuing to use pill box.  o Take medications as prescribed o Report any questions or concerns to PharmD and/or provider(s)  Please see past updates related to this goal by clicking on the "Past Updates" button in the selected goal         The patient verbalized understanding of instructions provided today and declined a print copy of patient instruction materials.   Telephone follow up appointment with pharmacy team member scheduled for: 08/2020  Sherre Poot, PharmD, [facility] Clinical Pharmacist [facility] [phone] (office) [phone] (mobile)     Carbohydrate Counting for Diabetes Mellitus, Adult  Carbohydrate counting is a method of keeping track of how many carbohydrates you eat. Eating carbohydrates naturally increases the amount of sugar (glucose) in the blood. Counting how many carbohydrates you eat helps keep your blood glucose within normal limits, which helps you manage your diabetes (diabetes mellitus). It is important to know how many carbohydrates you can safely have in each meal. This is different for every person. A diet and nutrition specialist (registered dietitian) can help you make a meal plan and calculate how many carbohydrates you should have at each meal and snack. Carbohydrates are found in the following foods:  Grains, such as breads and cereals.  Dried beans and soy products.  Starchy vegetables, such as potatoes, peas, and corn.  Fruit and fruit juices.  Milk and yogurt.  Sweets and snack foods, such as cake, cookies, candy, chips, and soft drinks. How do I count carbohydrates? There are two ways to count carbohydrates in food. You can use either of the methods or a combination of both. Reading "Nutrition Facts" on packaged food The "Nutrition Facts" list is  included on the labels of almost all packaged foods and beverages in the [place] It includes:  The serving size.  Information about nutrients in each serving, including the grams (g) of carbohydrate per serving. To use the "Nutrition Facts":  Decide how many servings you will have.  Multiply the number of servings by the number of carbohydrates per serving.  The resulting number is the total amount of carbohydrates that you will be having. Learning standard serving sizes of other foods When you eat carbohydrate foods that are not packaged or do not include "Nutrition Facts" on the label, you need to measure the servings in order to count the amount of carbohydrates:  Measure the foods that you will eat with a food scale or measuring cup, if needed.  Decide how many standard-size servings you will eat.  Multiply the number of servings by 15. Most carbohydrate-rich foods have about 15 g of carbohydrates per serving. ? For example, if you eat 8 oz (170 g) of strawberries, you will have eaten 2 servings and 30 g of carbohydrates (2 servings x 15 g = 30 g).  For foods that have more than one food mixed, such as soups and casseroles, you must count the carbohydrates in each food that is included.  The following list contains standard serving sizes of common carbohydrate-rich foods. Each of these servings has about 15 g of carbohydrates:   hamburger bun or  English muffin.   oz (15 mL) syrup.   oz (14 g) jelly.  1 slice of bread.  1 six-inch tortilla.  3 oz (85 g) cooked rice or pasta.  4 oz (113 g) cooked dried beans.  4 oz (113 g) starchy vegetable, such as peas, corn, or potatoes.  4 oz (113 g) hot cereal.  4 oz (113 g) mashed potatoes or  of a large baked potato.  4 oz (113 g) canned or frozen fruit.  4 oz (120 mL) fruit juice.  4-6 crackers.  6 chicken nuggets.  6 oz (170 g) unsweetened dry cereal.  6 oz (170 g) plain fat-free yogurt or yogurt sweetened with  artificial sweeteners.  8 oz (240 mL) milk.  8 oz (170 g) fresh fruit or one small piece of fruit.  24 oz (680 g) popped popcorn. Example of carbohydrate counting Sample meal  3 oz (85 g) chicken breast.  6 oz (170 g) [place] rice.  4 oz (113 g) corn.  8 oz (240 mL) milk.  8 oz (170 g) strawberries with sugar-free whipped topping. Carbohydrate calculation 1. Identify the foods that contain carbohydrates: ? Rice. ? Corn. ? Milk. ? Strawberries. 2. Calculate how many servings you have of each food: ? 2 servings rice. ? 1 serving corn. ? 1 serving milk. ? 1 serving strawberries. 3. Multiply each number of servings by 15 g: ? 2 servings rice x 15 g = 30 g. ? 1 serving corn x 15 g = 15 g. ? 1 serving milk x 15 g = 15 g. ? 1 serving strawberries x 15 g = 15 g. 4. Add together all of the amounts to find the total grams of carbohydrates eaten: ? 30 g + 15 g + 15 g + 15 g = 75 g of carbohydrates total. Summary  Carbohydrate counting is a method of keeping track of how many carbohydrates you eat.  Eating carbohydrates naturally increases the amount of sugar (glucose) in the blood.  Counting how many carbohydrates you eat helps keep your blood glucose within normal limits, which helps you manage your diabetes.  A diet and nutrition specialist (registered dietitian) can help you make a meal plan and calculate how many carbohydrates you should have at each meal and snack. This information is not intended to replace advice given to you by your health care provider. Make sure you discuss any questions you have with your health care provider. Document Revised: 06/10/2017 Document Reviewed: 04/29/2016 Elsevier Patient Education  [place].

## 2020-08-18 IMAGING — US US RENAL
1 series · 14 of 25 positions shown · non-contrast
Comparison: CT abdomen pelvis 04/05/2014

CLINICAL DATA: Chronic kidney disease.  Renal hypertension, obesity

EXAM:
RENAL / URINARY TRACT ULTRASOUND COMPLETE

[Series 1: us renal · 0.30mm/px · 14 of 50 slices shown]
[im 1/50]
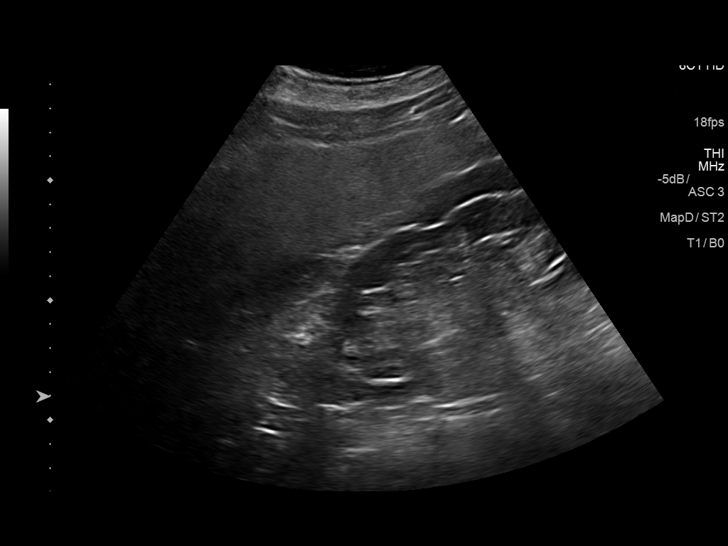
[im 5/50]
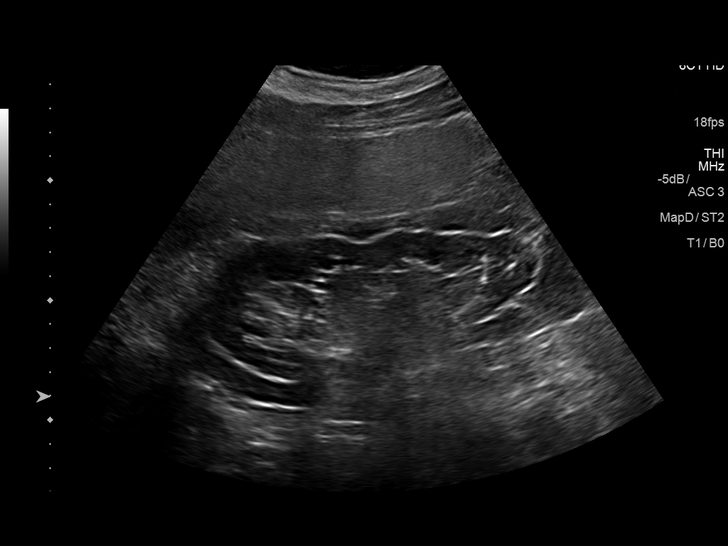
[im 9/50]
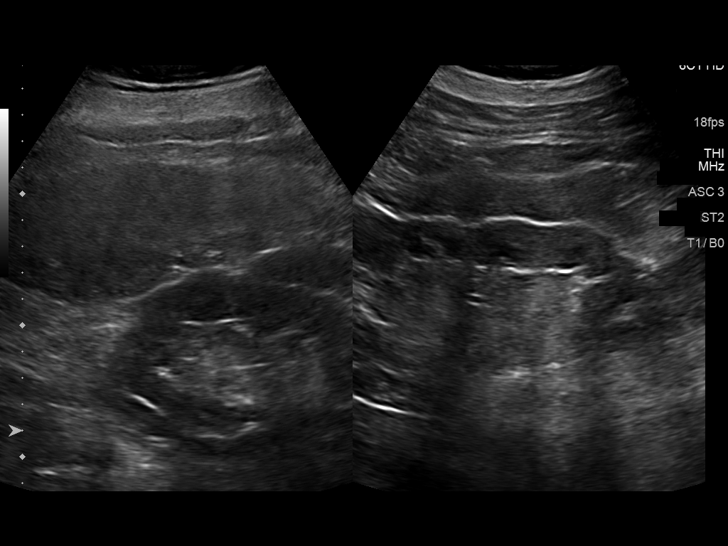
[im 13/50]
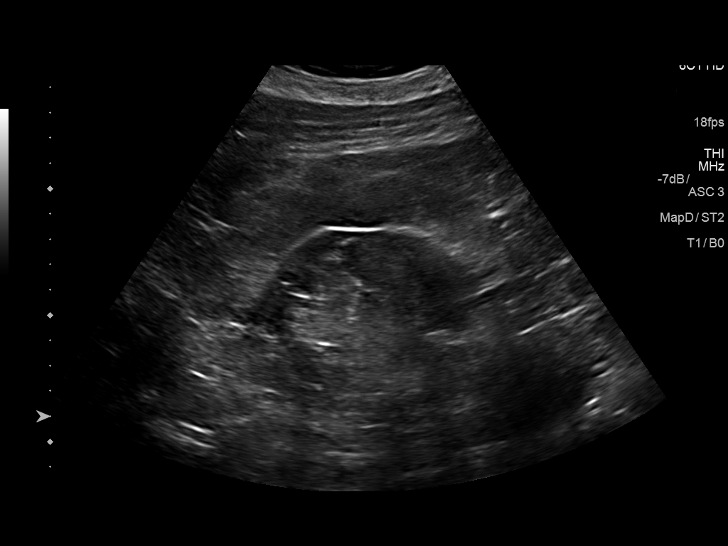
[im 17/50]
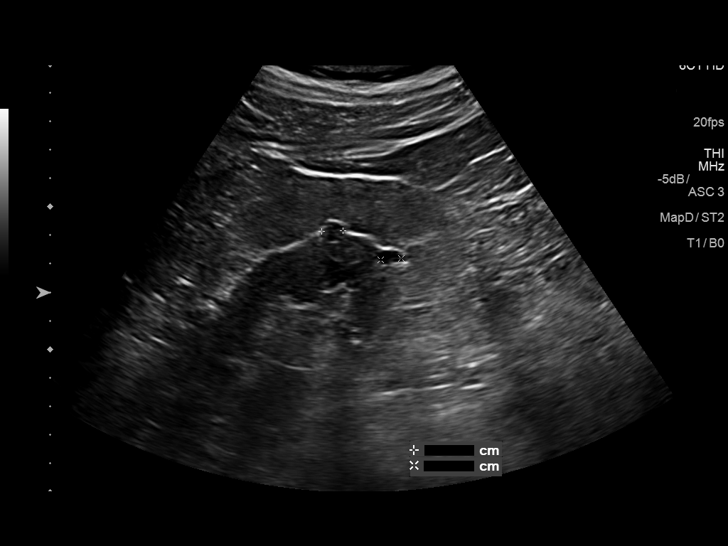
[im 19/50]
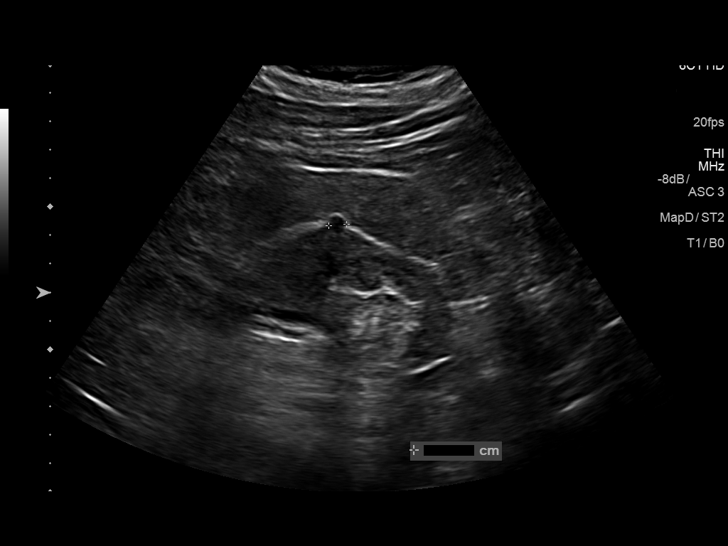
[im 23/50]
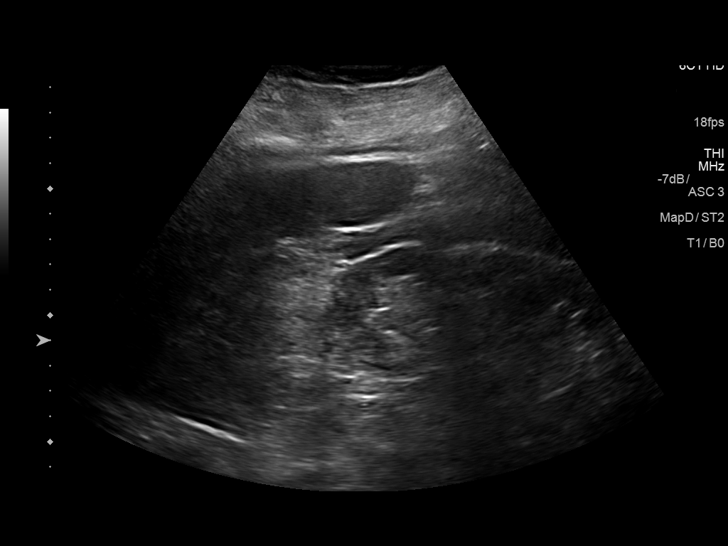
[im 27/50]
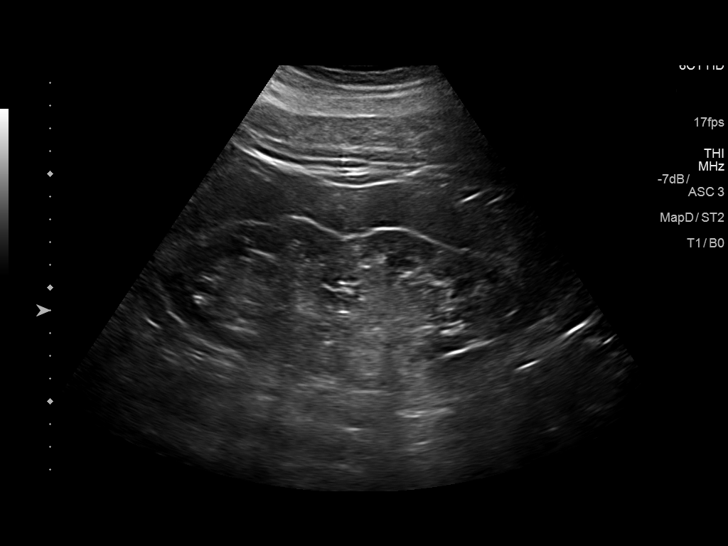
[im 31/50]
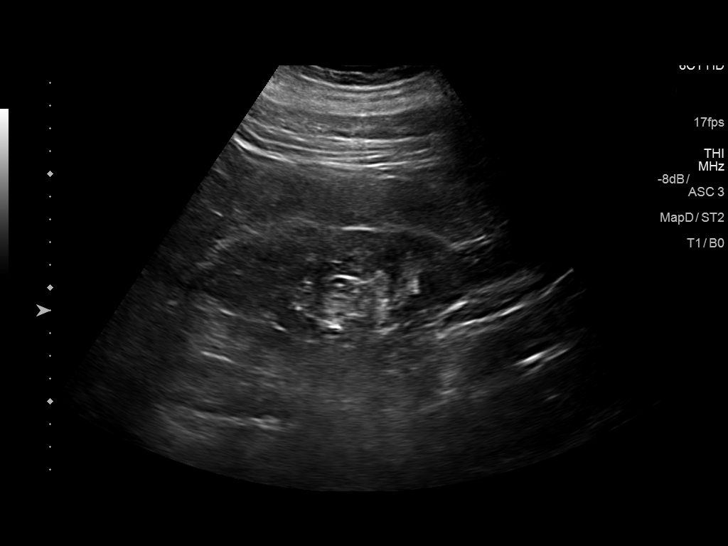
[im 33/50]
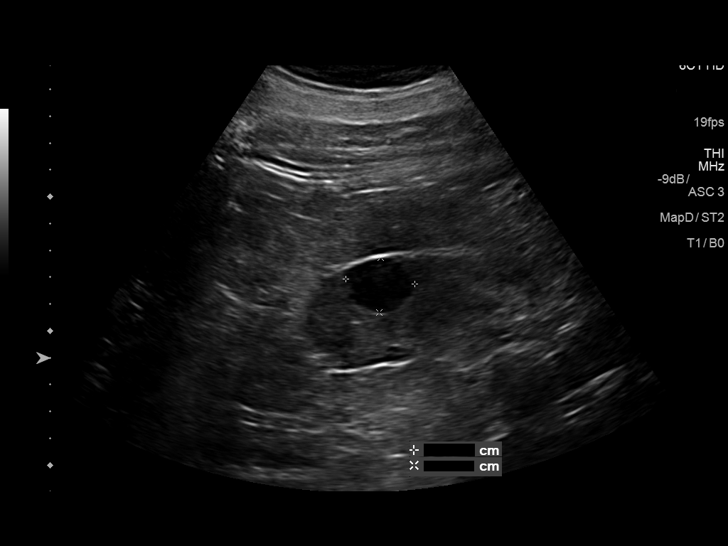
[im 37/50]
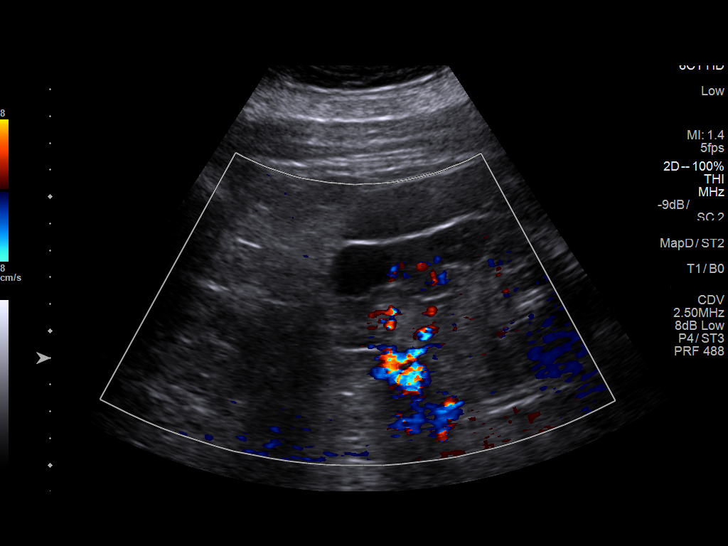
[im 41/50]
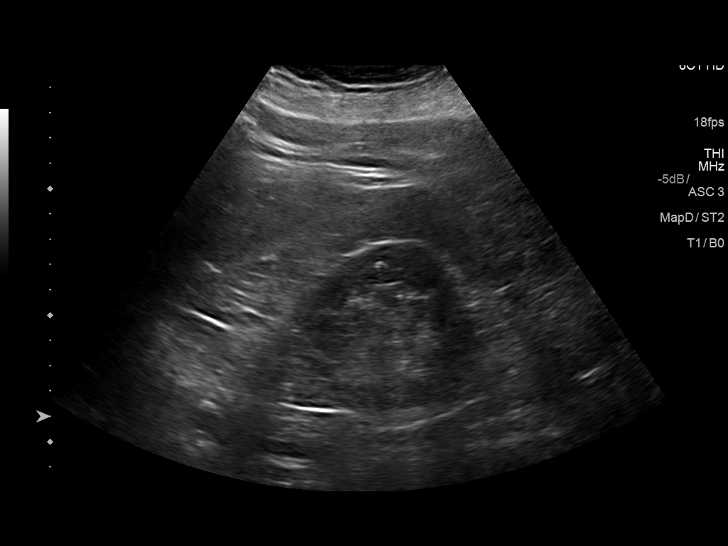
[im 45/50]
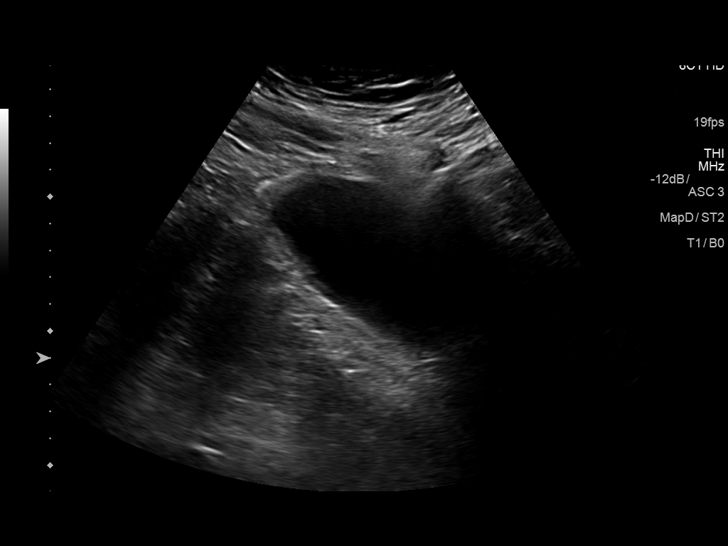
[im 50/50]
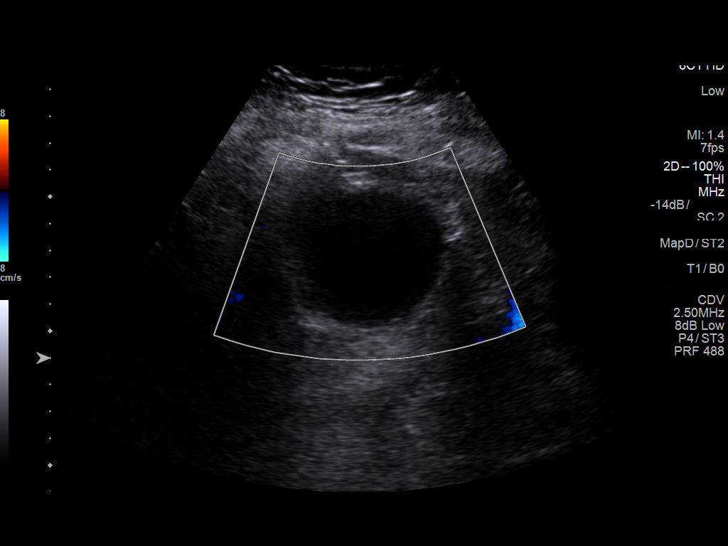

[14 of 25 positions shown; findings below may reference images not displayed]

FINDINGS: Right Kidney:

Length: 14.3 cm. 8 mm lower pole cyst. 7 mm lower pole cyst.
Echogenicity within normal limits. No mass or hydronephrosis
visualized.

Left Kidney:

Length: 16.2 cm. 2.6 x 2.0 cm simple cyst left midpole laterally..
Echogenicity within normal limits. No mass or hydronephrosis
visualized.

Bladder:

Appears normal for degree of bladder distention.
IMPRESSION: Negative renal ultrasound.  Small bilateral renal cysts.

## 2020-09-02 NOTE — Progress Notes (Signed)
Cancelled.  

## 2020-09-03 ENCOUNTER — Ambulatory Visit: Payer: Medicare HMO | Admitting: Family Medicine

## 2020-09-09 ENCOUNTER — Other Ambulatory Visit: Payer: Self-pay | Admitting: Physician Assistant

## 2020-09-10 ENCOUNTER — Other Ambulatory Visit: Payer: Self-pay

## 2020-09-10 ENCOUNTER — Telehealth: Payer: Medicare HMO

## 2020-09-11 ENCOUNTER — Ambulatory Visit (INDEPENDENT_AMBULATORY_CARE_PROVIDER_SITE_OTHER): Payer: Medicare HMO

## 2020-09-11 ENCOUNTER — Other Ambulatory Visit: Payer: Self-pay

## 2020-09-11 DIAGNOSIS — I5032 Chronic diastolic (congestive) heart failure: Secondary | ICD-10-CM

## 2020-09-11 DIAGNOSIS — N183 Chronic kidney disease, stage 3 unspecified: Secondary | ICD-10-CM

## 2020-09-11 DIAGNOSIS — I130 Hypertensive heart and chronic kidney disease with heart failure and stage 1 through stage 4 chronic kidney disease, or unspecified chronic kidney disease: Secondary | ICD-10-CM

## 2020-09-11 DIAGNOSIS — I4510 Unspecified right bundle-branch block: Secondary | ICD-10-CM | POA: Diagnosis not present

## 2020-09-11 DIAGNOSIS — I350 Nonrheumatic aortic (valve) stenosis: Secondary | ICD-10-CM

## 2020-09-11 DIAGNOSIS — I480 Paroxysmal atrial fibrillation: Secondary | ICD-10-CM | POA: Diagnosis not present

## 2020-09-11 DIAGNOSIS — Z79899 Other long term (current) drug therapy: Secondary | ICD-10-CM

## 2020-09-11 LAB — ECHOCARDIOGRAM COMPLETE
AR max vel: 1.51 cm2
AV Area VTI: 1.65 cm2
AV Area mean vel: 1.66 cm2
AV Mean grad: 15 mmHg
AV Peak grad: 29.8 mmHg
Ao pk vel: 2.73 m/s
Area-P 1/2: 2.87 cm2
S' Lateral: 3.85 cm

## 2020-09-11 NOTE — Progress Notes (Signed)
Complete echocardiogram performed.  Jimmy Ailynn Gow RDCS, RVT  

## 2020-09-12 ENCOUNTER — Telehealth: Payer: Self-pay

## 2020-09-12 NOTE — Telephone Encounter (Signed)
-----   Message from Richardo Priest, MD sent at 09/12/2020  7:45 AM EDT ----- Good result his aortic stenosis remains mild and we will recheck an echocardiogram in 2 years

## 2020-09-12 NOTE — Telephone Encounter (Signed)
Spoke with patient regarding results and recommendation.  Patient verbalizes understanding and is agreeable to plan of care. Advised patient to call back with any issues or concerns.  

## 2020-09-19 ENCOUNTER — Other Ambulatory Visit: Payer: Self-pay | Admitting: Family Medicine

## 2020-09-26 ENCOUNTER — Ambulatory Visit: Payer: Medicare HMO

## 2020-09-26 ENCOUNTER — Other Ambulatory Visit: Payer: Self-pay

## 2020-09-26 DIAGNOSIS — E782 Mixed hyperlipidemia: Secondary | ICD-10-CM

## 2020-09-26 DIAGNOSIS — I10 Essential (primary) hypertension: Secondary | ICD-10-CM

## 2020-09-26 NOTE — Chronic Care Management (AMB) (Signed)
Chronic Care Management Pharmacy  Name: Shawn Sullivan  MRN: [id number] DOB: [birth year]  Chief Complaint/ HPI  Shawn Sullivan,  74 y.o. , male presents for their Follow-Up CCM visit with the clinical pharmacist via telephone due to COVID-19 Pandemic.  PCP : Rochel Brome, MD  Their chronic conditions include: afib, hypertensive heart and kidney disease, chronic diastolic congestive heart failure, ckd stage 3, COPD, OSA, GERD, Type 2 DM, restless legs syndrome, polyneuropathy, hyperlipidemia, depressive disorder, chronic pain, anxiety.   Office Visits: 05/31/2020 - trial of ropinirole for leg pain/anxiety at night.  04/11/2020 - balanitis with urinary incontinence. Ordered condom catheter, fluconazole 100 mg, clotrimazole.  02/23/2020 - continue current medications.  Consult Visit: 06/28/2020 - Cardiology visit - stable rhythm continue amiodarone. Heart failure compensated. Needs full thyroid panel and proBNP level at next labs.  05/15/2020 - Pain management - telemed visit. Refill opioid medications.  02/13/2020 - Pain management - telemed visit. Refill opioid medications.   Medications: Outpatient Encounter Medications as of 09/26/2020  Medication Sig   acetaminophen (TYLENOL) 500 MG tablet Take 1,000 mg by mouth every 6 (six) hours as needed for moderate pain or headache.   amiodarone (PACERONE) 200 MG tablet Take 1 tablet (200 mg total) by mouth daily. (Patient taking differently: Take 200 mg by mouth every evening. )   apixaban (ELIQUIS) 5 MG TABS tablet Take 5 mg by mouth 2 (two) times daily.    b complex vitamins tablet Take 1 tablet by mouth daily.   BD PEN NEEDLE MICRO U/F 32G X 6 MM MISC SMARTSIG:1 Needle SUB-Q Daily   Catheters (FREEDOM CATH MALE EXT CATHETER) MISC 1 each by Does not apply route daily.   cholecalciferol (VITAMIN D3) 25 MCG (1000 UT) tablet Take 1,000 Units by mouth daily.    CINNAMON PO Take 700 mg by mouth daily.    ciprofloxacin (CIPRO)  250 MG tablet Take 1 tablet (250 mg total) by mouth daily with breakfast.   Cyanocobalamin (B-12) 5000 MCG CAPS Take 5,000 mcg by mouth daily.    Fluticasone-Umeclidin-Vilant (TRELEGY ELLIPTA) 100-62.5-25 MCG/INH AEPB Inhale 1 puff into the lungs daily.   folic acid (FOLVITE) 409 MCG tablet Take 800 mcg by mouth daily.   glipiZIDE (GLUCOTROL) 10 MG tablet TAKE 1 TABLET BY MOUTH TWICE DAILY BEFORE A MEAL   HYDROcodone-acetaminophen (NORCO) 10-325 MG tablet Take 1 tablet by mouth every 4 (four) hours as needed for moderate pain.    Insulin Glargine-Lixisenatide (SOLIQUA) 100-33 UNT-MCG/ML SOPN Inject 55 Units into the skin daily.    ipratropium-albuterol (DUONEB) 0.5-2.5 (3) MG/3ML SOLN Take 3 mLs by nebulization every 6 (six) hours as needed (shortness of breath).    levocetirizine (XYZAL) 5 MG tablet Take 5 mg by mouth daily as needed for allergies.   magnesium chloride (SLOW-MAG) 64 MG TBEC SR tablet Take 1 tablet (64 mg total) by mouth 2 (two) times daily. (Patient taking differently: Take 1 tablet by mouth daily. )   metolazone (ZAROXOLYN) 5 MG tablet Take 5 mg by mouth once a week. Uses prn if swollen once weekly.   metoprolol tartrate (LOPRESSOR) 50 MG tablet Take 1 tablet (50 mg total) by mouth every 6 (six) hours. (Patient taking differently: Take 50 mg by mouth in the morning, at noon, and at bedtime. )   montelukast (SINGULAIR) 10 MG tablet Take 1 tablet (10 mg total) by mouth every evening.   Omega-3 Fatty Acids (OMEGA-3 FISH OIL CONCENTRATE) 1000 MG CPDR Take 2,000 mg by mouth  2 (two) times a day.   Omeprazole 20 MG TBEC Take 20 mg by mouth 2 (two) times daily before a meal.    potassium chloride SA (K-DUR) 20 MEQ tablet Take 20 mEq by mouth 2 (two) times daily.   pyridOXINE (VITAMIN B-6) 100 MG tablet Take 100 mg by mouth daily.    rOPINIRole (REQUIP) 0.5 MG tablet TAKE 1 TABLET BY MOUTH AT BEDTIME   rosuvastatin (CRESTOR) 20 MG tablet Take 1 tablet (20 mg total) by  mouth daily. (Patient taking differently: Take 20 mg by mouth at bedtime. )   sertraline (ZOLOFT) 50 MG tablet Take 1 tablet by mouth twice daily   tamsulosin (FLOMAX) 0.4 MG CAPS capsule TAKE 1 CAPSULE BY MOUTH BEFORE BEDTIME   torsemide (DEMADEX) 20 MG tablet Take 1 tablet (20 mg total) by mouth 2 (two) times daily. Take an extra tablet (20 mg) in the afternoon on Monday, Wednesday, Friday. (Patient taking differently: Take 40 mg by mouth 2 (two) times daily. 2 in the am and 2 at noon)   VENTOLIN HFA 108 (90 Base) MCG/ACT inhaler Inhale 2 puffs into the lungs every 4 (four) hours as needed for wheezing or shortness of breath.   No facility-administered encounter medications on file as of 09/26/2020.   Allergies  Allergen Reactions   Androgel [Testosterone] Other (See Comments)    Pedal edema    Biaxin [Clarithromycin] Other (See Comments)    Taste perception alteration   Duloxetine Other (See Comments)    Hallucinations   Gabapentin Other (See Comments)    hallucinations   Ibuprofen Other (See Comments)    GI upset    Lyrica [Pregabalin] Swelling   Spironolactone Other (See Comments)   SDOH Screenings   Alcohol Screen:    Last Alcohol Screening Score (AUDIT): Not on file  Depression (PHQ2-9):    PHQ-2 Score: Not on file  Financial Resource Strain:    Difficulty of Paying Living Expenses: Not on file  Food Insecurity: No Food Insecurity   Worried About Running Out of Food in the Last Year: Never true   Ran Out of Food in the Last Year: Never true  Housing: Low Risk    Last Housing Risk Score: 0  Physical Activity:    Days of Exercise per Week: Not on file   Minutes of Exercise per Session: Not on file  Social Connections:    Frequency of Communication with Friends and Family: Not on file   Frequency of Social Gatherings with Friends and Family: Not on file   Attends Religious Services: Not on file   Active Member of Clubs or Organizations: Not on  file   Attends Archivist Meetings: Not on file   Marital Status: Not on file  Stress:    Feeling of Stress : Not on file  Tobacco Use: Medium Risk   Smoking Tobacco Use: Former Smoker   Smokeless Tobacco Use: Never Used  Transportation Needs:    Film/video editor (Medical): Not on file   Lack of Transportation (Non-Medical): Not on file     Current Diagnosis/Assessment:  Goals Addressed            This [place] (see longitudinal plan of care for additional care plan information)  Current Barriers:   Chronic Disease Management support, education, and care coordination needs related to Hypertension, Hyperlipidemia, Diabetes, and COPD   Hypertension BP Readings from  Last 3 Encounters:  07/18/20 140/70  07/15/20 140/70  06/28/20 (!) 138/72    Pharmacist Clinical Goal(s): o Over the next 90 days, patient will work with PharmD and providers to achieve BP goal <130/80  Current regimen:  o Metoprolol 50 mg three times daily o Torsemide 40 mg twice daily o Metolazone 5 mg once weekly as needed for swelling  Interventions: o Discussed benefits of exercise. Increase activity as tolerated to goal of 150 minutes each week beginning with 5-10 minutes each day.   Patient self care activities - Over the next 90 days, patient will: o Check BP weekly, document, and provide at future appointments o Ensure daily salt intake < 2300 mg/day  Hyperlipidemia Lab Results  Component Value Date/Time   LDLCALC 87 05/31/2020 10:48 AM    Pharmacist Clinical Goal(s): o Over the next 90 days, patient will work with PharmD and providers to achieve LDL goal < 70  Current regimen:  o Rosuvastatin 20 mg daily o Omega-3 Fish Oil 2000 mg twice daily   Interventions: o Recommend patient incorporate daily exercise with goal of 150 minutes. o Continue work on [employer] of lean meats, vegetables and fruit and whole  grains in moderation.   o Continue taking medication as prescribed.  o Consider increasing rosuvastatin if lipid panel is not at goal.  Patient self care activities - Over the next 90 days, patient will: o Take medication as prescribed.  o Contact pharmacist or provider with any questions.   Diabetes Lab Results  Component Value Date/Time   HGBA1C 12.0 (H) 05/31/2020 10:48 AM   HGBA1C 9.0 (H) 02/23/2020 10:56 AM    Pharmacist Clinical Goal(s): o Over the next 90 days, patient will work with PharmD and providers to achieve A1c goal <7%  Current regimen:  o Ozempic 1 mg weekly  o Tresiba 50 units daily.   Interventions: o Reviewed home blood sugar readings.  o Discussed current Diabetes regimen.  o Coordinating patient assistance for 2022.  o Discussed appropriate ways to correct low blood sugar.  o Discussed CGM and requesting order to [facility] to determine coverage.  o Discussed benefits of optimal blood sugar control.  Patient self care activities - Over the next 90 days, patient will: o Check blood sugar once daily, document, and provide at future appointments o Contact provider with any episodes of hypoglycemia  COPD  Pharmacist Clinical Goal(s) o Over the next 90 days, patient will work with PharmD and providers to improve adherence with maintenance regimen.   Current regimen:  o Breztri 2 puffs twice daily o Montelukast 10 mg every evening o Ventolin 2 puffs every 4 hours prn shortness of breath or wheezing  Interventions: o Breztri approved through patient assistance.  o Recommend using maintenance inhaler every day to prevent use of rescue inhaler or exacerbations.   Patient self care activities - Over the next 90 days, patient will: o Use maintenance inhaler daily.  o Complete Patient Assistance for Breztri to improve access to care.   Medication management  Pharmacist Clinical Goal(s): o Over the next 90 days, patient will work with PharmD and providers  to achieve optimal medication adherence  Current pharmacy: [facility]  Interventions o Comprehensive medication review performed. o Utilize [facility] for medication synchronization, packaging and delivery  Patient self care activities - Over the next 90 days, patient will: o Focus on medication adherence by continuing to use pill box.  o Take medications as prescribed o Report any questions or  concerns to PharmD and/or provider(s)  Please see past updates related to this goal by clicking on the "Past Updates" button in the selected goal         AFIB   Patient is currently rhythm controlled. HR 77 BPM  Patient has failed these meds in past: n/a Patient is currently controlled on the following medications:   Amiodarone 200 mg daily  Eliquis 5 mg bid  We discussed:  Patient is receiving Eliquis through patient assistance. Pharmacist will coordinate renewal of medication for 2022.   Plan  Continue current medications  ,  COPD / Asthma / Tobacco   Eosinophil count:  No results found for: EOSPCT%                               Eos (Absolute):  Lab Results  Component Value Date/Time   EOSABS 0.2 07/18/2020 02:32 PM    Tobacco Status:  Social History   Tobacco Use  Smoking Status Former Smoker   Quit date: 1993   Years since quitting: 28.8  Smokeless Tobacco Never Used    Patient has failed these meds in past: [place] Patient is currently controlled on the following medications:   Breztri 2 puffs twice daily  Duoneb every 6 hours prn shortness of breath  Levoceterizine 5 mg daily prn allergies  Montelukast 10 mg daily every evening  Ventolin 2 puffs every 4 hours prn shortness of breath wheezing   Using maintenance inhaler regularly? Yes Frequency of rescue inhaler use:  1-2x per week  We discussed:  proper inhaler technique. Patient reports good control of symptoms with Breztri. Pharmacist will coordinate application to continue  patient assistance for 2022.   Plan  Continue current medications.  Diabetes   Recent Relevant Labs: Lab Results  Component Value Date/Time   HGBA1C 12.0 (H) 05/31/2020 10:48 AM   HGBA1C 9.0 (H) 02/23/2020 10:56 AM   MICROALBUR 150 05/31/2020 11:09 AM     Kidney Function Lab Results  Component Value Date/Time   CREATININE 1.24 07/18/2020 02:32 PM   CREATININE 1.32 (H) 05/31/2020 10:48 AM   GFRNONAA 56 (L) 07/18/2020 02:32 PM   GFRAA 65 07/18/2020 02:32 PM   K 4.4 07/18/2020 02:32 PM   K 4.8 05/31/2020 10:48 AM    Checking BG: Daily  Recent FBG Readings: 189-245  Has had low blood sugar 1 time in the past month - 96 mg/dL  Patient has failed these meds in past: Tresiba, Victoza, metformin Patient is currently uncontrolled on the following medications:   Glipizide 10 mg bid before a meal  Tresiba 50 units   Ozempic 0.5 mg weekly  BD Pen needle 32gx40m use with TTyler Aas Last diabetic Foot exam: July 2021 Last diabetic Eye exam: Daughter uncertain of exact date but has been completed in the last year.   We discussed: diet and exercise extensively. Patient reports blood sugars improving but not at goal. Has had 1 reading of 96 mg/dL in the last month. Reports blood sugar readings typically in the 200s range occasionally ~180.   Patient states he is tolerating medications well. Denies questions or concerns. Medication received through patient assistance program. Patient is interested in a CGM if insurance will cover it. [place].   Patient's daughter states he is currently using 1 mg weekly and tolerating well. Patient's blood sugar remains above goal. Pharmacist would like to  add Farxiga 5 mg daily to regimen. Patient is already approved for [place] patient assistance and could receive [place] without concern of the doughnut hole.   Plan   Recommend consider adding Farxiga 5 mg daily to  regimen to improve blood sugar.  Recommend Freestyle Libre 2 sent to [facility] to determine coverage.    Hyperlipidemia   LDL goal < 70  Lipid Panel     Component Value Date/Time   CHOL 153 05/31/2020 1048   TRIG 173 (H) 05/31/2020 1048   HDL 36 (L) 05/31/2020 1048   LDLCALC 87 05/31/2020 1048    Hepatic Function Latest Ref Rng & Units 07/18/2020 05/31/2020 03/19/2020  Total Protein 6.0 - 8.5 g/dL - 6.2 6.6  Albumin 3.7 - 4.7 g/dL 3.7 3.6(L) 4.3  AST 0 - 40 IU/L - 33 44(H)  ALT 0 - 44 IU/L - 22 36  Alk Phosphatase 48 - 121 IU/L - 140(H) 129(H)  Total Bilirubin 0.0 - 1.2 mg/dL - 0.2 1.0     The 10-year ASCVD risk score Mikey Bussing DC Jr., et al., 2013) is: 40.6%   Values used to calculate the score:     Age: 21 years     Sex: Male     Is Non-Hispanic African American: No     Diabetic: Yes     Tobacco smoker: No     Systolic Blood Pressure: 867 mmHg     Is BP treated: Yes     HDL Cholesterol: 36 mg/dL     Total Cholesterol: 153 mg/dL   Patient has failed these meds in past: atorvastatin Patient is currently uncontrolled on the following medications:   Rosuvastatin 20 mg daily  Omega-3 2000 mg bid  We discussed:  diet and exercise extensively. Patient is scheduled for blood work and visit with Dr. Tobie Poet in November. Would like to consider increasing Crestor to 40 mg daily if patient's cholesterol is not at goal.   Plan  Continue current medications. Consider increase in Crestor after next blood work.     Vaccines   Reviewed and discussed patient's vaccination history.    Discussed Moderna booster shot with patient and let him know that he could receive third dose in office. Patient reports that he plans to get flu shot at visit with Dr. Tobie Poet and would like to separate COVID and Flu. Will call office back when ready to schedule.   Immunization History  Administered Date(s) Administered   Influenza-Unspecified 08/16/2019   Moderna SARS-COVID-2 Vaccination 12/22/2019,  01/19/2020   Pneumococcal Conjugate-13 07/26/2014   Pneumococcal Polysaccharide-23 08/19/2012    Plan  Recommended patient receive annual flu vaccine in office.   Sleep   Patient has failed these meds in past: none reported Patient is currently uncontrolled on the following medications:   n/a  We discussed:  Patient reports that he is having difficulty staying asleep. Denies daytime napping. Discussed good sleep hygiene. States that he wakes up and can't return to sleep. Doesn't feel that this is coming from symptoms of restless leg. Would like to discuss with Dr. Tobie Poet at next visit.   Plan  Discuss with Dr. Tobie Poet at visit.    Medication Management   Pt uses [facility] for all medications Uses pill box? Yes Pt endorses fair compliance  We discussed: Patient reports that he has plenty of his medication through patient assistance. Jenny Reichmann continues to pick up his medication at [facility] for now. Will revisit option of delivery/packaging in January.   Plan  Continue current medication management strategy    Follow up: 2 month phone visit

## 2020-09-27 NOTE — Patient Instructions (Addendum)
Visit Information  Goals Addressed            This Visit's Progress   . Pharmacy Care Plan       CARE PLAN ENTRY (see longitudinal plan of care for additional care plan information)  Current Barriers:  . Chronic Disease Management support, education, and care coordination needs related to Hypertension, Hyperlipidemia, Diabetes, and COPD   Hypertension BP Readings from Last 3 Encounters:  07/18/20 140/70  07/15/20 140/70  06/28/20 (!) 138/72   . Pharmacist Clinical Goal(s): o Over the next 90 days, patient will work with PharmD and providers to achieve BP goal <130/80 . Current regimen:  o Metoprolol 50 mg three times daily o Torsemide 40 mg twice daily o Metolazone 5 mg once weekly as needed for swelling . Interventions: o Discussed benefits of exercise. Increase activity as tolerated to goal of 150 minutes each week beginning with 5-10 minutes each day.  . Patient self care activities - Over the next 90 days, patient will: o Check BP weekly, document, and provide at future appointments o Ensure daily salt intake < 2300 mg/day  Hyperlipidemia Lab Results  Component Value Date/Time   LDLCALC 87 05/31/2020 10:48 AM   . Pharmacist Clinical Goal(s): o Over the next 90 days, patient will work with PharmD and providers to achieve LDL goal < 70 . Current regimen:  o Rosuvastatin 20 mg daily o Omega-3 Fish Oil 2000 mg twice daily  . Interventions: o Recommend patient incorporate daily exercise with goal of 150 minutes. o Continue work on [employer] of lean meats, vegetables and fruit and whole grains in moderation.   o Continue taking medication as prescribed.  o Consider increasing rosuvastatin if lipid panel is not at goal. . Patient self care activities - Over the next 90 days, patient will: o Take medication as prescribed.  o Contact pharmacist or provider with any questions.   Diabetes Lab Results  Component Value Date/Time   HGBA1C 12.0 (H) 05/31/2020 10:48 AM    HGBA1C 9.0 (H) 02/23/2020 10:56 AM   . Pharmacist Clinical Goal(s): o Over the next 90 days, patient will work with PharmD and providers to achieve A1c goal <7% . Current regimen:  o Ozempic 1 mg weekly  o Tresiba 50 units daily.  . Interventions: o Reviewed home blood sugar readings.  o Discussed current Diabetes regimen.  o Coordinating patient assistance for 2022.  o Discussed appropriate ways to correct low blood sugar.  o Discussed CGM and requesting order to [facility] to determine coverage.  o Discussed benefits of optimal blood sugar control. . Patient self care activities - Over the next 90 days, patient will: o Check blood sugar once daily, document, and provide at future appointments o Contact provider with any episodes of hypoglycemia  COPD . Pharmacist Clinical Goal(s) o Over the next 90 days, patient will work with PharmD and providers to improve adherence with maintenance regimen.  . Current regimen:  o Breztri 2 puffs twice daily o Montelukast 10 mg every evening o Ventolin 2 puffs every 4 hours prn shortness of breath or wheezing . Interventions: o Breztri approved through patient assistance.  o Recommend using maintenance inhaler every day to prevent use of rescue inhaler or exacerbations.  . Patient self care activities - Over the next 90 days, patient will: o Use maintenance inhaler daily.  o Complete Patient Assistance for Breztri to improve access to care.   Medication management . Pharmacist Clinical Goal(s): o Over the next 90  days, patient will work with PharmD and providers to achieve optimal medication adherence . Current pharmacy: [facility] . Interventions o Comprehensive medication review performed. o Utilize [facility] for medication synchronization, packaging and delivery . Patient self care activities - Over the next 90 days, patient will: o Focus on medication adherence by continuing to use pill box.  o Take medications as  prescribed o Report any questions or concerns to PharmD and/or provider(s)  Please see past updates related to this goal by clicking on the "Past Updates" button in the selected goal         Patient verbalizes understanding of instructions provided today.   Telephone follow up appointment with pharmacy team member scheduled for: 10/2020  Sherre Poot, PharmD, [facility] Clinical Pharmacist [facility] [phone] (office) [phone] (mobile)  Dapagliflozin tablets What is this medicine? DAPAGLIFLOZIN (DAP a gli FLOE zin) controls blood sugar in people with diabetes. It is used with lifestyle changes like diet and exercise. It also treats heart failure. It may lower the need for treatment of heart failure in the hospital. This medicine may be used for other purposes; ask your health care provider or pharmacist if you have questions. COMMON BRAND NAME(S): [name] What should I tell my health care provider before I take this medicine? They need to know if you have any of these conditions:  dehydration  diabetic ketoacidosis  diet low in salt  eating less due to illness, surgery, dieting, or any other reason  having surgery  history of pancreatitis or pancreas problems  history of yeast infection of the penis or vagina  if you often drink alcohol  infections in the bladder, kidneys, or urinary tract  kidney disease  low blood pressure  on hemodialysis  problems urinating  type 1 diabetes  uncircumcised male  an unusual or allergic reaction to dapagliflozin, other medicines, foods, dyes, or preservatives  pregnant or trying to get pregnant  breast-feeding How should I use this medicine? Take this medicine by mouth with a glass of water. Follow the directions on the prescription label. You can take it with or without food. If it upsets your stomach, take it with food. Take this medicine in the morning. Take your dose at the same time each day. Do  not take more often than directed. Do not stop taking except on your doctor's advice. A special MedGuide will be given to you by the pharmacist with each prescription and refill. Be sure to read this information carefully each time. Talk to your pediatrician regarding the use of this medicine in children. Special care may be needed. Overdosage: If you think you have taken too much of this medicine contact a poison control center or emergency room at once. NOTE: This medicine is only for you. Do not share this medicine with others. What if I miss a dose? If you miss a dose, take it as soon as you can. If it is almost time for your next dose, take only that dose. Do not take double or extra doses. What may interact with this medicine? Do not take this medicine with any of the following medications:  gatifloxacin This medicine may also interact with the following medications:  alcohol  certain medicines for blood pressure, heart disease  diuretics  insulin  nateglinide  pioglitazone  quinolone antibiotics like ciprofloxacin, levofloxacin, ofloxacin  repaglinide  some herbal dietary supplements  steroid medicines like prednisone or cortisone  sulfonylureas like glimepiride, glipizide, glyburide  thyroid medicine This list  may not describe all possible interactions. Give your health care provider a list of all the medicines, herbs, non-prescription drugs, or dietary supplements you use. Also tell them if you smoke, drink alcohol, or use illegal drugs. Some items may interact with your medicine. What should I watch for while using this medicine? Visit your doctor or health care professional for regular checks on your progress. This medicine can cause a serious condition in which there is too much acid in the blood. If you develop nausea, vomiting, stomach pain, unusual tiredness, or breathing problems, stop taking this medicine and call your doctor right away. If possible, use a  ketone dipstick to check for ketones in your urine. A test called the HbA1C (A1C) will be monitored. This is a simple blood test. It measures your blood sugar control over the last 2 to 3 months. You will receive this test every 3 to 6 months. Learn how to check your blood sugar. Learn the symptoms of low and high blood sugar and how to manage them. Always carry a quick-source of sugar with you in case you have symptoms of low blood sugar. Examples include hard sugar candy or glucose tablets. Make sure others know that you can choke if you eat or drink when you develop serious symptoms of low blood sugar, such as seizures or unconsciousness. They must get medical help at once. Tell your doctor or health care professional if you have high blood sugar. You might need to change the dose of your medicine. If you are sick or exercising more than usual, you might need to change the dose of your medicine. Do not skip meals. Ask your doctor or health care professional if you should avoid alcohol. Many nonprescription cough and cold products contain sugar or alcohol. These can affect blood sugar. Wear a medical ID bracelet or chain, and carry a card that describes your disease and details of your medicine and dosage times. What side effects may I notice from receiving this medicine? Side effects that you should report to your doctor or health care professional as soon as possible:  allergic reactions like skin rash, itching or hives, swelling of the face, lips, or tongue  breathing problems  dizziness  feeling faint or lightheaded, falls  muscle weakness  nausea, vomiting, unusual stomach upset or pain  new pain or tenderness, change in skin color, sores or ulcers, or infection in legs or feet  penile discharge, itching, or pain in men  signs and symptoms of a genital infection, such as fever; tenderness, redness, or swelling in the genitals or area from the genitals to the back of the  rectum  signs and symptoms of low blood sugar such as feeling anxious, confusion, dizziness, increased hunger, unusually weak or tired, sweating, shakiness, cold, irritable, headache, blurred vision, fast heartbeat, loss of consciousness  signs and symptoms of a urinary tract infection, such as fever, chills, a burning feeling when urinating, blood in the urine, back pain  trouble passing urine or change in the amount of urine, including an urgent need to urinate more often, in larger amounts, or at night  unusual tiredness  vaginal discharge, itching, or odor in women Side effects that usually do not require medical attention (report to your doctor or health care professional if they continue or are bothersome):  mild increase in urination  thirsty This list may not describe all possible side effects. Call your doctor for medical advice about side effects. You may report side effects to  FDA at 1-800-FDA-1088. Where should I keep my medicine? Keep out of the reach of children. Store at room temperature between 15 and 30 degrees C (59 and 86 degrees F). Throw away any unused medicine after the expiration date. NOTE: This sheet is a summary. It may not cover all possible information. If you have questions about this medicine, talk to your doctor, pharmacist, or health care provider.  2020 [employer] Standard (2019-04-06 18:58:14)

## 2020-10-02 NOTE — Chronic Care Management (AMB) (Signed)
Please attest visit. Thanks!

## 2020-10-10 NOTE — Progress Notes (Signed)
Established Patient Office Visit  Subjective:  Patient ID: Shawn Sullivan, male    DOB: 04-08-1946  Age: 73 y.o. MRN: [id number]  CC:  Chief Complaint  Patient presents with  . Atrial Fibrillation  . Diabetes  . Hyperlipidemia  . COPD  . Gastroesophageal Reflux    HPI Shawn Sullivan presents for follow up diabetes  Shawn Sullivan presents with type 2 diabetes mellitus with diabetic polyneuropathy.  Specifically, this is type 2, insulin requiring diabetes, complicated by nephropathy and peripheral neuropathy.  Compliance with treatment has been good; he takes his medication as directed, follows up as directed,   FBS: 245s Current meds include an insulin/injectable [name] 50 U daily and Ozempic 1 mg weekly).  The hypoglycemic episodes are not present.[name] He checks his glucose 2 times per day.  In regard to preventative care, he performs foot self-exams daily and his last ophthalmology exam was in 12/2017.  Poor diet. NO exercise.    Pt presents with hyperlipidemia.  Current treatment includes Crestor, coenzyme q 10,  and a low cholesterol/low fat diet.  Compliance with treatment has been good; he takes his medication as directed and follows up as directed.      Dx with chronic obstructive pulmonary disease, unspecified; the duration of COPD has been several years.  Patient has an established diagnosis of COPD.  Medicaions include  breztri and ventolin.  using duoneb/ventolin - rescue. States with recent weather changes he has used his meds consistently    In regard to the other persistent atrial fibrillation, he is here for routine follow-up for atrial fibrillation. Current related medications include amiodarone,  metoprolol for rate control, and Eliquis.   He is extremely compliant with his medication regimen. Follows cardiology regularly   RLS - uses requip.     Shawn Sullivan presents with a diagnosis of gastro-esophageal reflux disease without esophagitis.  Taking omeprazole.  OSA- not  wearing bipap. Gets bipap through [facility]. Daughter would like to order on line.   Past Medical History:  Diagnosis Date  . Anticoagulated 07/01/2018  . Anticoagulated 07/01/2018  . Benign essential hypertension 05/06/2013  . Chronic obstructive pulmonary disease ([place]) 11/05/2015  . COPD (chronic obstructive pulmonary disease) ([place]) 07/01/2018  . Dermatochalasis 12/16/2015  . Disorder of bursae and tendons in shoulder region 01/05/2014  . Generalized edema 06/22/2016  . Hyperlipidemia 12/16/2015  . Hypertensive heart disease 07/01/2018  . Low back pain 08/17/2013  . Lumbosacral spondylosis 05/06/2013  . Obstructive sleep apnea 07/01/2018  . Persistent atrial fibrillation ([place]) 07/01/2018  . Piriformis syndrome 05/03/2014  . Postlaminectomy syndrome, lumbar region 05/06/2013  . Presbyopia of both eyes 12/16/2015  . Restless legs syndrome 03/23/2016  . Restrictive lung disease 06/22/2016  . Sleep apnea 05/06/2013  . Type 2 diabetes mellitus without complications ([place]) 32/02/4009    Past Surgical History:  Procedure Laterality Date  . APPENDECTOMY    . BACK SURGERY    . CARDIOVERSION N/A 12/01/2018   Procedure: CARDIOVERSION;  Surgeon: Sanda Klein, MD;  Location: [facility];  Service: Cardiovascular;  Laterality: N/A;  . CATARACT EXTRACTION Bilateral   . LUMBAR FUSION    . NECK SURGERY      Family History  Problem Relation Age of Onset  . Atrial fibrillation Mother   . Cancer Mother   . Heart disease Father     Social History   Socioeconomic History  . Marital status: Married    Spouse name: Not on file  . Number of children: Not  on file  . Years of education: Not on file  . Highest education level: Not on file  Occupational History  . Not on file  Tobacco Use  . Smoking status: Former Smoker    Quit date: 1993    Years since quitting: 28.8  . Smokeless tobacco: Never Used  Vaping Use  . Vaping Use: Never used  Substance and Sexual Activity  . Alcohol use: Not Currently  .  Drug use: Not Currently    Types: Hydrocodone  . Sexual activity: Not on file  Other Topics Concern  . Not on file  Social History Narrative  . Not on file   Social Determinants of Health   Financial Resource Strain:   . Difficulty of Paying Living Expenses: Not on file  Food Insecurity: No Food Insecurity  . Worried About Charity fundraiser in the Last Year: Never true  . Ran Out of Food in the Last Year: Never true  Transportation Needs:   . Lack of Transportation (Medical): Not on file  . Lack of Transportation (Non-Medical): Not on file  Physical Activity:   . Days of Exercise per Week: Not on file  . Minutes of Exercise per Session: Not on file  Stress:   . Feeling of Stress : Not on file  Social Connections:   . Frequency of Communication with Friends and Family: Not on file  . Frequency of Social Gatherings with Friends and Family: Not on file  . Attends Religious Services: Not on file  . Active Member of Clubs or Organizations: Not on file  . Attends Archivist Meetings: Not on file  . Marital Status: Not on file  Intimate Partner Violence:   . Fear of Current or Ex-Partner: Not on file  . Emotionally Abused: Not on file  . Physically Abused: Not on file  . Sexually Abused: Not on file     Current Outpatient Medications:  .  insulin degludec (TRESIBA FLEXTOUCH) 100 UNIT/ML FlexTouch Pen, Inject 50 Units into the skin daily., Disp: , Rfl:  .  acetaminophen (TYLENOL) 500 MG tablet, Take 1,000 mg by mouth every 6 (six) hours as needed for moderate pain or headache., Disp: , Rfl:  .  amiodarone (PACERONE) 200 MG tablet, Take 1 tablet (200 mg total) by mouth daily. (Patient taking differently: Take 200 mg by mouth every evening. ), Disp: 90 tablet, Rfl: 1 .  apixaban (ELIQUIS) 5 MG TABS tablet, Take 5 mg by mouth 2 (two) times daily. , Disp: , Rfl:  .  b complex vitamins tablet, Take 1 tablet by mouth daily., Disp: , Rfl:  .  BD PEN NEEDLE MICRO U/F 32G X 6  MM MISC, SMARTSIG:1 Needle SUB-Q Daily, Disp: , Rfl:  .  Budeson-Glycopyrrol-Formoterol (BREZTRI AEROSPHERE) 160-9-4.8 MCG/ACT AERO, Inhale 2 puffs into the lungs 2 (two) times daily., Disp: 10.7 g, Rfl: 0 .  Catheters (FREEDOM CATH MALE EXT CATHETER) MISC, 1 each by Does not apply route daily., Disp: 30 each, Rfl: 3 .  cholecalciferol (VITAMIN D3) 25 MCG (1000 UT) tablet, Take 1,000 Units by mouth daily. , Disp: , Rfl:  .  CINNAMON PO, Take 700 mg by mouth daily. , Disp: , Rfl:  .  Continuous Blood Gluc Receiver (FREESTYLE LIBRE 2 READER) DEVI, 1 each by Does not apply route 4 (four) times daily - after meals and at bedtime., Disp: 1 each, Rfl: 0 .  Continuous Blood Gluc Sensor (FREESTYLE LIBRE 2 SENSOR) MISC, 2 each by Does  not apply route every 14 (fourteen) days., Disp: 6 each, Rfl: 3 .  Cyanocobalamin (B-12) 5000 MCG CAPS, Take 5,000 mcg by mouth daily. , Disp: , Rfl:  .  folic acid (FOLVITE) 536 MCG tablet, Take 800 mcg by mouth daily., Disp: , Rfl:  .  HYDROcodone-acetaminophen (NORCO) 10-325 MG tablet, Take 1 tablet by mouth every 4 (four) hours as needed for moderate pain. , Disp: , Rfl:  .  ipratropium-albuterol (DUONEB) 0.5-2.5 (3) MG/3ML SOLN, Take 3 mLs by nebulization every 6 (six) hours as needed (shortness of breath). , Disp: , Rfl:  .  levocetirizine (XYZAL) 5 MG tablet, Take 5 mg by mouth daily as needed for allergies., Disp: , Rfl:  .  magnesium chloride (SLOW-MAG) 64 MG TBEC SR tablet, Take 1 tablet (64 mg total) by mouth 2 (two) times daily. (Patient taking differently: Take 1 tablet by mouth daily. ), Disp: 60 tablet, Rfl: 3 .  metolazone (ZAROXOLYN) 5 MG tablet, Take 5 mg by mouth once a week. Uses prn if swollen once weekly., Disp: , Rfl:  .  metoprolol tartrate (LOPRESSOR) 50 MG tablet, Take 1 tablet (50 mg total) by mouth every 6 (six) hours. (Patient taking differently: Take 50 mg by mouth in the morning, at noon, and at bedtime. ), Disp: 360 tablet, Rfl: 2 .  montelukast  (SINGULAIR) 10 MG tablet, Take 1 tablet (10 mg total) by mouth every evening., Disp: 90 tablet, Rfl: 3 .  Omeprazole 20 MG TBEC, Take 20 mg by mouth 2 (two) times daily before a meal. , Disp: , Rfl:  .  potassium chloride SA (K-DUR) 20 MEQ tablet, Take 20 mEq by mouth 2 (two) times daily., Disp: , Rfl:  .  pyridOXINE (VITAMIN B-6) 100 MG tablet, Take 100 mg by mouth daily. , Disp: , Rfl:  .  rOPINIRole (REQUIP) 0.5 MG tablet, TAKE 1 TABLET BY MOUTH AT BEDTIME, Disp: 30 tablet, Rfl: 0 .  rosuvastatin (CRESTOR) 20 MG tablet, Take 1 tablet (20 mg total) by mouth daily. (Patient taking differently: Take 20 mg by mouth at bedtime. ), Disp: 90 tablet, Rfl: 1 .  Semaglutide,0.25 or 0.5MG /DOS, (OZEMPIC, 0.25 OR 0.5 MG/DOSE,) 2 MG/1.5ML SOPN, Inject into the skin., Disp: , Rfl:  .  sertraline (ZOLOFT) 50 MG tablet, Take 1 tablet by mouth twice daily, Disp: 180 tablet, Rfl: 1 .  tamsulosin (FLOMAX) 0.4 MG CAPS capsule, TAKE 1 CAPSULE BY MOUTH BEFORE BEDTIME, Disp: 90 capsule, Rfl: 3 .  torsemide (DEMADEX) 20 MG tablet, Take 1 tablet (20 mg total) by mouth 2 (two) times daily. Take an extra tablet (20 mg) in the afternoon on Monday, Wednesday, Friday. (Patient taking differently: Take 40 mg by mouth 2 (two) times daily. 2 in the am and 2 at noon), Disp: 72 tablet, Rfl: 3 .  VENTOLIN HFA 108 (90 Base) MCG/ACT inhaler, Inhale 2 puffs into the lungs every 4 (four) hours as needed for wheezing or shortness of breath., Disp: 18 g, Rfl: 11   Allergies  Allergen Reactions  . Androgel [Testosterone] Other (See Comments)    Pedal edema   . Biaxin [Clarithromycin] Other (See Comments)    Taste perception alteration  . Duloxetine Other (See Comments)    Hallucinations  . Gabapentin Other (See Comments)    hallucinations  . Ibuprofen Other (See Comments)    GI upset   . Lyrica [Pregabalin] Swelling  . Spironolactone Other (See Comments)    Review of Systems  Constitutional: Positive for malaise/fatigue.  Negative for chills and fever.  HENT: Negative for ear pain, sinus pain and sore throat.   Respiratory: Negative for cough and shortness of breath.   Cardiovascular: Negative for chest pain.  Gastrointestinal: Negative for abdominal pain, constipation and diarrhea.  Genitourinary: Positive for frequency. Negative for dysuria.  Musculoskeletal: Positive for joint pain and myalgias.  Neurological: Negative for dizziness and headaches.  Endo/Heme/Allergies: Positive for polydipsia.  Psychiatric/Behavioral: Negative for depression. The patient is not nervous/anxious.      Objective:    PHYSICAL EXAM:   VS: BP 136/72   Pulse 76   Temp 97.7 F (36.5 C)   Resp 18   Ht 6' (1.829 m)   Wt (!) 341 lb (154.7 kg)   BMI 46.25 kg/m  Physical Exam Vitals reviewed.  Constitutional:      Appearance: Normal appearance. He is obese.  Neck:     Vascular: No carotid bruit.  Cardiovascular:     Rate and Rhythm: Normal rate. Rhythm irregular.     Pulses: Normal pulses.     Heart sounds: Normal heart sounds.  Pulmonary:     Effort: Pulmonary effort is normal.     Breath sounds: Normal breath sounds.  Abdominal:     General: Bowel sounds are normal.     Palpations: Abdomen is soft. There is no mass.     Tenderness: There is no abdominal tenderness.  Musculoskeletal:     Right lower leg: No edema.     Left lower leg: Edema (did not take his diuretic yet. ) present.  Neurological:     Mental Status: He is alert.  Psychiatric:        Mood and Affect: Mood normal.        Behavior: Behavior normal.    Diabetic Foot Exam - Simple   Simple Foot Form Diabetic Foot exam was performed with the following findings: Yes 10/11/2020 10:14 AM  Visual Inspection See comments: Yes Sensation Testing Intact to touch and monofilament testing bilaterally: Yes Pulse Check Posterior Tibialis and Dorsalis pulse intact bilaterally: Yes Comments Callused, thickened nails.      BP 136/72   Pulse 76    Temp 97.7 F (36.5 C)   Resp 18   Ht 6' (1.829 m)   Wt (!) 341 lb (154.7 kg)   BMI 46.25 kg/m  Wt Readings from Last 3 Encounters:  10/11/20 (!) 341 lb (154.7 kg)  07/18/20 (!) 335 lb (152 kg)  07/15/20 (!) 333 lb (151 kg)   No visits with results within 1 Day(s) from this visit.  Latest known visit with results is:  Appointment on 09/11/2020  Component Date Value Ref Range Status  . S' Lateral 09/11/2020 3.85  cm Final  . Area-P 1/2 09/11/2020 2.87  cm2 Final  . AV Area mean vel 09/11/2020 1.66  cm2 Final  . AV Area VTI 09/11/2020 1.65  cm2 Final  . AR max vel 09/11/2020 1.51  cm2 Final  . AV Mean grad 09/11/2020 15.0  mmHg Final  . Ao pk vel 09/11/2020 2.73  m/s Final  . AV Peak grad 09/11/2020 29.8  mmHg Final     Health Maintenance Due  Topic Date Due  . Hepatitis C Screening  Never done  . OPHTHALMOLOGY EXAM  Never done  . TETANUS/TDAP  Never done    There are no preventive care reminders to display for this patient.  No results found for: TSH Lab Results  Component Value Date   WBC 11.3 (H)  07/18/2020   HGB 14.2 07/18/2020   HCT 44.9 07/18/2020   MCV 90 07/18/2020   PLT 308 07/18/2020   Lab Results  Component Value Date   NA 138 07/18/2020   K 4.4 07/18/2020   CO2 25 07/18/2020   GLUCOSE 234 (H) 07/18/2020   BUN 16 07/18/2020   CREATININE 1.24 07/18/2020   BILITOT 0.2 05/31/2020   ALKPHOS 140 (H) 05/31/2020   AST 33 05/31/2020   ALT 22 05/31/2020   PROT 6.2 05/31/2020   ALBUMIN 3.7 07/18/2020   CALCIUM 10.0 07/18/2020   Lab Results  Component Value Date   CHOL 153 05/31/2020   Lab Results  Component Value Date   HDL 36 (L) 05/31/2020   Lab Results  Component Value Date   LDLCALC 87 05/31/2020   Lab Results  Component Value Date   TRIG 173 (H) 05/31/2020   Lab Results  Component Value Date   CHOLHDL 4.3 05/31/2020   Lab Results  Component Value Date   HGBA1C 12.0 (H) 05/31/2020      Assessment & Plan:  1. Mixed  hyperlipidemia Continue to work on eating a healthy diet and exercise.  Labs drawn today.  - Lipid panel; Future  2. Atrial fibrillation, chronic (HCC) The current medical regimen is effective;  continue present plan and medications.  3. Diabetic glomerulopathy (HCC) Control: hopefully improved, but I am concerned. Recommend start using CGM. Free style Libre sample applied and order sent.  Recommend check feet daily. Recommend annual eye exams. Medicines: no changes at this time.  Continue to work on eating a healthy diet and exercise.  Labs drawn today.   - CBC with Differential/Platelet; Future - Hemoglobin A1c; Future  4. Need for immunization against influenza - Flu Vaccine QUAD High Dose(Fluad)  5. Hypertensive heart and kidney disease with chronic diastolic congestive heart failure and stage 3a chronic kidney disease ([place]) The current medical regimen is effective;  continue present plan and medications. Recommend continue to work on eating healthy diet and exercise. - CBC with Differential/Platelet; Future - Comprehensive metabolic panel; Future  6. Morbid obesity with BMI of 45.0-49.9, adult ([place]) Recommend continue to work on eating healthy diet and exercise.  7. Vitamin D deficiency - Vitamin D level.  8. Uncontrolled type 2 diabetes mellitus with hyperglycemia ([place]) Application CGM Freestyle libre. - Continuous Blood Gluc Receiver (FREESTYLE LIBRE 2 READER) DEVI; 1 each by Does not apply route 4 (four) times daily - after meals and at bedtime.  Dispense: 1 each; Refill: 0 - Continuous Blood Gluc Sensor (FREESTYLE LIBRE 2 SENSOR) MISC; 2 each by Does not apply route every 14 (fourteen) days.  Dispense: 6 each; Refill: 3  My nursing staff have aided in the documentation of this note on the behalf of Rochel Brome, MD,as directed by  Rochel Brome, MD and thoroughly reviewed by Rochel Brome, MD.  I spent 35 minutes dedicated to the care of this patient on the date of this  encounter to include face-to-face time with the patient, as well as: Preparing to see the patient (review of labs.) Obtaining and/or reviewing separately obtained history. Performing a medically appropriate examination and/or evaluation. Counseling and educating the patient/family, particularly his daughter. Ordering medications, tests, or procedures.  Spent time teaching them how to apply a free style libre. Documenting clinical information in the electronic or other health record.   Follow-up: Return in about 3 months (around 01/11/2021) for fasting.   Rochel Brome, MD

## 2020-10-11 ENCOUNTER — Encounter: Payer: Self-pay | Admitting: Family Medicine

## 2020-10-11 ENCOUNTER — Other Ambulatory Visit: Payer: Self-pay

## 2020-10-11 ENCOUNTER — Ambulatory Visit (INDEPENDENT_AMBULATORY_CARE_PROVIDER_SITE_OTHER): Payer: Medicare HMO | Admitting: Family Medicine

## 2020-10-11 VITALS — BP 136/72 | HR 76 | Temp 97.7°F | Resp 18 | Ht 72.0 in | Wt 341.0 lb

## 2020-10-11 DIAGNOSIS — Z23 Encounter for immunization: Secondary | ICD-10-CM | POA: Diagnosis not present

## 2020-10-11 DIAGNOSIS — I10 Essential (primary) hypertension: Secondary | ICD-10-CM

## 2020-10-11 DIAGNOSIS — E559 Vitamin D deficiency, unspecified: Secondary | ICD-10-CM

## 2020-10-11 DIAGNOSIS — I130 Hypertensive heart and chronic kidney disease with heart failure and stage 1 through stage 4 chronic kidney disease, or unspecified chronic kidney disease: Secondary | ICD-10-CM

## 2020-10-11 DIAGNOSIS — E1121 Type 2 diabetes mellitus with diabetic nephropathy: Secondary | ICD-10-CM

## 2020-10-11 DIAGNOSIS — I482 Chronic atrial fibrillation, unspecified: Secondary | ICD-10-CM

## 2020-10-11 DIAGNOSIS — E1165 Type 2 diabetes mellitus with hyperglycemia: Secondary | ICD-10-CM

## 2020-10-11 DIAGNOSIS — I5032 Chronic diastolic (congestive) heart failure: Secondary | ICD-10-CM

## 2020-10-11 DIAGNOSIS — E119 Type 2 diabetes mellitus without complications: Secondary | ICD-10-CM

## 2020-10-11 DIAGNOSIS — E782 Mixed hyperlipidemia: Secondary | ICD-10-CM

## 2020-10-11 DIAGNOSIS — N1831 Chronic kidney disease, stage 3a: Secondary | ICD-10-CM

## 2020-10-11 DIAGNOSIS — Z6841 Body Mass Index (BMI) 40.0 and over, adult: Secondary | ICD-10-CM

## 2020-10-11 MED ORDER — BREZTRI AEROSPHERE 160-9-4.8 MCG/ACT IN AERO: 2.0000 | INHALATION_SPRAY | Freq: Two times a day (BID) | RESPIRATORY_TRACT | 0 refills | Status: DC

## 2020-10-11 MED ORDER — FREESTYLE LIBRE 2 SENSOR MISC: 2.0000 | 3 refills | Status: DC

## 2020-10-11 MED ORDER — FREESTYLE LIBRE 2 READER DEVI: 1.0000 | Freq: Three times a day (TID) | 0 refills | Status: DC

## 2020-10-15 ENCOUNTER — Other Ambulatory Visit: Payer: Self-pay

## 2020-10-15 DIAGNOSIS — E1165 Type 2 diabetes mellitus with hyperglycemia: Secondary | ICD-10-CM

## 2020-10-15 MED ORDER — FREESTYLE LIBRE 2 READER DEVI: 1.0000 | Freq: Three times a day (TID) | 0 refills | Status: DC

## 2020-10-15 MED ORDER — FREESTYLE LIBRE 2 SENSOR MISC: 2.0000 | 3 refills | Status: DC

## 2020-10-17 ENCOUNTER — Telehealth (INDEPENDENT_AMBULATORY_CARE_PROVIDER_SITE_OTHER): Payer: Medicare HMO | Admitting: Legal Medicine

## 2020-10-17 ENCOUNTER — Encounter: Payer: Self-pay | Admitting: Legal Medicine

## 2020-10-17 VITALS — BP 165/80 | HR 80 | Ht 72.0 in | Wt 341.0 lb

## 2020-10-17 DIAGNOSIS — J019 Acute sinusitis, unspecified: Secondary | ICD-10-CM | POA: Diagnosis not present

## 2020-10-17 MED ORDER — AMOXICILLIN-POT CLAVULANATE 875-125 MG PO TABS: 1.0000 | ORAL_TABLET | Freq: Two times a day (BID) | ORAL | 0 refills | Status: DC

## 2020-10-17 NOTE — Progress Notes (Signed)
Virtual Visit via Telephone Note   This visit type was conducted due to national recommendations for restrictions regarding the COVID-19 Pandemic (e.g. social distancing) in an effort to limit this patient's exposure and mitigate transmission in our community.  Due to his co-morbid illnesses, this patient is at least at moderate risk for complications without adequate follow up.  This format is felt to be most appropriate for this patient at this time.  The patient did not have access to video technology/had technical difficulties with video requiring transitioning to audio format only (telephone).  All issues noted in this document were discussed and addressed.  No physical exam could be performed with this format.  Patient verbally consented to a telehealth visit.   Date:  10/17/2020   ID:  Shawn Sullivan, DOB 1946-10-30, MRN [id number]  Patient Location: Home Provider Location: Office/Clinic  PCP:  Rochel Brome, MD   Evaluation Performed:  New Patient Evaluation  Chief Complaint:  He got flu shot this month, sinus drainage and cough  History of Present Illness:    JAHAAN Sullivan is a 74 y.o. male with He got flu shot this month, sinus drainage and cough, no fever r chills.   The patient does not have symptoms concerning for COVID-19 infection (fever, chills, cough, or new shortness of breath).    Past Medical History:  Diagnosis Date  . Anticoagulated 07/01/2018  . Anticoagulated 07/01/2018  . Benign essential hypertension 05/06/2013  . Chronic obstructive pulmonary disease ([place]) 11/05/2015  . COPD (chronic obstructive pulmonary disease) ([place]) 07/01/2018  . Dermatochalasis 12/16/2015  . Disorder of bursae and tendons in shoulder region 01/05/2014  . Generalized edema 06/22/2016  . Hyperlipidemia 12/16/2015  . Hypertensive heart disease 07/01/2018  . Low back pain 08/17/2013  . Lumbosacral spondylosis 05/06/2013  . Obstructive sleep apnea 07/01/2018  . Persistent atrial fibrillation  ([place]) 07/01/2018  . Piriformis syndrome 05/03/2014  . Postlaminectomy syndrome, lumbar region 05/06/2013  . Presbyopia of both eyes 12/16/2015  . Restless legs syndrome 03/23/2016  . Restrictive lung disease 06/22/2016  . Sleep apnea 05/06/2013  . Type 2 diabetes mellitus without complications ([place]) 06/30/4480    Past Surgical History:  Procedure Laterality Date  . APPENDECTOMY    . BACK SURGERY    . CARDIOVERSION N/A 12/01/2018   Procedure: CARDIOVERSION;  Surgeon: Sanda Klein, MD;  Location: [facility];  Service: Cardiovascular;  Laterality: N/A;  . CATARACT EXTRACTION Bilateral   . LUMBAR FUSION    . NECK SURGERY      Family History  Problem Relation Age of Onset  . Atrial fibrillation Mother   . Cancer Mother   . Heart disease Father     Social History   Socioeconomic History  . Marital status: Married    Spouse name: Not on file  . Number of children: Not on file  . Years of education: Not on file  . Highest education level: Not on file  Occupational History  . Not on file  Tobacco Use  . Smoking status: Former Smoker    Quit date: 1993    Years since quitting: 28.8  . Smokeless tobacco: Never Used  Vaping Use  . Vaping Use: Never used  Substance and Sexual Activity  . Alcohol use: Not Currently  . Drug use: Not Currently    Types: Hydrocodone  . Sexual activity: Not on file  Other Topics Concern  . Not on file  Social History Narrative  . Not on file   Social  Determinants of Health   Financial Resource Strain:   . Difficulty of Paying Living Expenses: Not on file  Food Insecurity: No Food Insecurity  . Worried About Charity fundraiser in the Last Year: Never true  . Ran Out of Food in the Last Year: Never true  Transportation Needs:   . Lack of Transportation (Medical): Not on file  . Lack of Transportation (Non-Medical): Not on file  Physical Activity:   . Days of Exercise per Week: Not on file  . Minutes of Exercise per Session: Not on file    Stress:   . Feeling of Stress : Not on file  Social Connections:   . Frequency of Communication with Friends and Family: Not on file  . Frequency of Social Gatherings with Friends and Family: Not on file  . Attends Religious Services: Not on file  . Active Member of Clubs or Organizations: Not on file  . Attends Archivist Meetings: Not on file  . Marital Status: Not on file  Intimate Partner Violence:   . Fear of Current or Ex-Partner: Not on file  . Emotionally Abused: Not on file  . Physically Abused: Not on file  . Sexually Abused: Not on file    Outpatient Medications Prior to Visit  Medication Sig Dispense Refill  . acetaminophen (TYLENOL) 500 MG tablet Take 1,000 mg by mouth every 6 (six) hours as needed for moderate pain or headache.    [name] amiodarone (PACERONE) 200 MG tablet Take 1 tablet (200 mg total) by mouth daily. (Patient taking differently: Take 200 mg by mouth every evening. ) 90 tablet 1  . apixaban (ELIQUIS) 5 MG TABS tablet Take 5 mg by mouth 2 (two) times daily.     [name] b complex vitamins tablet Take 1 tablet by mouth daily.    . BD PEN NEEDLE MICRO U/F 32G X 6 MM MISC SMARTSIG:1 Needle SUB-Q Daily    . Budeson-Glycopyrrol-Formoterol (BREZTRI AEROSPHERE) 160-9-4.8 MCG/ACT AERO Inhale 2 puffs into the lungs 2 (two) times daily. 10.7 g 0  . Catheters (FREEDOM CATH MALE EXT CATHETER) MISC 1 each by Does not apply route daily. 30 each 3  . cholecalciferol (VITAMIN D3) 25 MCG (1000 UT) tablet Take 1,000 Units by mouth daily.     [name] CINNAMON PO Take 700 mg by mouth daily.     . Continuous Blood Gluc Receiver (FREESTYLE LIBRE 2 READER) DEVI 1 each by Does not apply route 4 (four) times daily - after meals and at bedtime. 1 each 0  . Continuous Blood Gluc Sensor (FREESTYLE LIBRE 2 SENSOR) MISC 2 each by Does not apply route every 14 (fourteen) days. 6 each 3  . Cyanocobalamin (B-12) 5000 MCG CAPS Take 5,000 mcg by mouth daily.     . folic acid (FOLVITE) 347 MCG  tablet Take 800 mcg by mouth daily.    [name] HYDROcodone-acetaminophen (NORCO) 10-325 MG tablet Take 1 tablet by mouth every 4 (four) hours as needed for moderate pain.     [name] insulin degludec (TRESIBA FLEXTOUCH) 100 UNIT/ML FlexTouch Pen Inject 50 Units into the skin daily.    [name] ipratropium-albuterol (DUONEB) 0.5-2.5 (3) MG/3ML SOLN Take 3 mLs by nebulization every 6 (six) hours as needed (shortness of breath).     [name] levocetirizine (XYZAL) 5 MG tablet Take 5 mg by mouth daily as needed for allergies.    . magnesium chloride (SLOW-MAG) 64 MG TBEC SR tablet Take 1 tablet (64 mg total) by mouth 2 (  two) times daily. (Patient taking differently: Take 1 tablet by mouth daily. ) 60 tablet 3  . metolazone (ZAROXOLYN) 5 MG tablet Take 5 mg by mouth once a week. Uses prn if swollen once weekly.    . metoprolol tartrate (LOPRESSOR) 50 MG tablet Take 1 tablet (50 mg total) by mouth every 6 (six) hours. (Patient taking differently: Take 50 mg by mouth in the morning, at noon, and at bedtime. ) 360 tablet 2  . montelukast (SINGULAIR) 10 MG tablet Take 1 tablet (10 mg total) by mouth every evening. 90 tablet 3  . Omeprazole 20 MG TBEC Take 20 mg by mouth 2 (two) times daily before a meal.     . potassium chloride SA (K-DUR) 20 MEQ tablet Take 20 mEq by mouth 2 (two) times daily.    [name] pyridOXINE (VITAMIN B-6) 100 MG tablet Take 100 mg by mouth daily.     [name] rOPINIRole (REQUIP) 0.5 MG tablet TAKE 1 TABLET BY MOUTH AT BEDTIME 30 tablet 0  . rosuvastatin (CRESTOR) 20 MG tablet Take 1 tablet (20 mg total) by mouth daily. (Patient taking differently: Take 20 mg by mouth at bedtime. ) 90 tablet 1  . Semaglutide,0.25 or 0.5MG /DOS, (OZEMPIC, 0.25 OR 0.5 MG/DOSE,) 2 MG/1.5ML SOPN Inject into the skin.    [name] sertraline (ZOLOFT) 50 MG tablet Take 1 tablet by mouth twice daily 180 tablet 1  . tamsulosin (FLOMAX) 0.4 MG CAPS capsule TAKE 1 CAPSULE BY MOUTH BEFORE BEDTIME 90 capsule 3  . torsemide (DEMADEX) 20 MG tablet Take 1 tablet  (20 mg total) by mouth 2 (two) times daily. Take an extra tablet (20 mg) in the afternoon on Monday, Wednesday, Friday. (Patient taking differently: Take 40 mg by mouth 2 (two) times daily. 2 in the am and 2 at noon) 72 tablet 3  . VENTOLIN HFA 108 (90 Base) MCG/ACT inhaler Inhale 2 puffs into the lungs every 4 (four) hours as needed for wheezing or shortness of breath. 18 g 11   No facility-administered medications prior to visit.    Allergies:   Androgel [testosterone], Biaxin [clarithromycin], Duloxetine, Gabapentin, Ibuprofen, Lyrica [pregabalin], and Spironolactone   Social History   Tobacco Use  . Smoking status: Former Smoker    Quit date: 1993    Years since quitting: 28.8  . Smokeless tobacco: Never Used  Vaping Use  . Vaping Use: Never used  Substance Use Topics  . Alcohol use: Not Currently  . Drug use: Not Currently    Types: Hydrocodone     Review of Systems  Constitutional: Negative for chills, fever and malaise/fatigue.  HENT: Positive for congestion, sinus pain and sore throat.   Eyes: Negative.   Respiratory: Positive for cough. Negative for sputum production.   Cardiovascular: Negative for chest pain.  Gastrointestinal: Negative.   Genitourinary: Negative for dysuria.  Musculoskeletal: Positive for myalgias.  Neurological: Negative.      Labs/Other Tests and Data Reviewed:    Recent Labs: 05/31/2020: ALT 22 07/18/2020: BUN 16; Creatinine, Ser 1.24; Hemoglobin 14.2; Platelets 308; Potassium 4.4; Sodium 138   Recent Lipid Panel Lab Results  Component Value Date/Time   CHOL 153 05/31/2020 10:48 AM   TRIG 173 (H) 05/31/2020 10:48 AM   HDL 36 (L) 05/31/2020 10:48 AM   CHOLHDL 4.3 05/31/2020 10:48 AM   LDLCALC 87 05/31/2020 10:48 AM    Wt Readings from Last 3 Encounters:  10/17/20 (!) 341 lb (154.7 kg)  10/11/20 (!) 341 lb (154.7 kg)  07/18/20 [name])  335 lb (152 kg)     Objective:    Vital Signs:  BP (!) 165/80   Pulse 80   Ht 6' (1.829 m)   Wt (!)  341 lb (154.7 kg)   BMI 46.25 kg/m    Physical Exam vs reviewed  ASSESSMENT & PLAN:   Diagnoses and all orders for this visit: Acute non-recurrent sinusitis, unspecified location -     amoxicillin-clavulanate (AUGMENTIN) 875-125 MG tablet; Take 1 tablet by mouth 2 (two) times daily. Patient will be treated with augmentin.  If not improving come into office.     20 minutes spent of visit  COVID-19 Education: The signs and symptoms of COVID-19 were discussed with the patient and how to seek care for testing (follow up with PCP or arrange E-visit). The importance of social distancing was discussed today.  Time:   I spent 20 minutes dedicated to the care of this patient on the date of this encounter to include face-to-face time with the patient, as well as: Preparing to see the patient (review of tests). Obtaining and/or reviewing separately obtained history. Performing a medically appropriate examination and/or evaluation. Counseling and educating the patient/family/caregiver. Ordering medications, tests, or procedures. Communicating with other health care professionals (not separately reported). Documenting clinical information in the electronic or other health record. Independently interpreting results (not separately reported) and communicating results to the patient/family/caregiver.  My nursing staff have aided in the documentation of this note on the behalf of Reinaldo Meeker, MD,as directed by  Reinaldo Meeker, MD and thoroughly reviewed by Reinaldo Meeker, MD.  Follow Up:  In Person prn  Signed,  Reinaldo Meeker, MD  10/17/2020 3:29 PM    [place]

## 2020-10-18 ENCOUNTER — Encounter: Payer: Self-pay | Admitting: Family Medicine

## 2020-10-21 ENCOUNTER — Encounter: Payer: Medicare HMO | Admitting: Family Medicine

## 2020-10-21 ENCOUNTER — Other Ambulatory Visit: Payer: Self-pay

## 2020-10-21 DIAGNOSIS — E1165 Type 2 diabetes mellitus with hyperglycemia: Secondary | ICD-10-CM

## 2020-10-21 MED ORDER — FREESTYLE LIBRE 2 SENSOR MISC: 2.0000 | 3 refills | Status: DC

## 2020-10-21 MED ORDER — FREESTYLE LIBRE 2 READER DEVI: 1.0000 | Freq: Three times a day (TID) | 0 refills | Status: DC

## 2020-10-23 ENCOUNTER — Other Ambulatory Visit: Payer: Self-pay | Admitting: Physician Assistant

## 2020-10-31 NOTE — Chronic Care Management (AMB) (Signed)
Chronic Care Management Pharmacy  Name: Shawn Sullivan  MRN: [id number] DOB: 1946-03-06  Chief Complaint/ HPI  Shawn Sullivan,  74 y.o. , male presents for their Follow-Up CCM visit with the clinical pharmacist via telephone due to COVID-19 Pandemic.  PCP : Shawn Brome, MD   Plan Recommendations:   Diabetes - patient was unable to afford Freestyle Libre copay. Patient is due to come in for updated blood work. Consider adding [name] if a1c remains elevated?   Cholesterol - patient currently taking rosuvastatin 20 mg daily. If lipid panel elevated, consider increasing to 40 mg daily?   Their chronic conditions include: afib, hypertensive heart and kidney disease, chronic diastolic congestive heart failure, ckd stage 3, COPD, OSA, GERD, Type 2 DM, restless legs syndrome, polyneuropathy, hyperlipidemia, depressive disorder, chronic pain, anxiety.   Office Visits: 10/17/2020 - acute non-recurrent sinusitis. Augmentin prescribed.  10/11/2020 - flu vaccine given. Libre samples applied and order sent.  Consult Visit: 06/28/2020 - Cardiology visit - stable rhythm continue amiodarone. Heart failure compensated. Needs full thyroid panel and proBNP level at next labs.  05/15/2020 - Pain management - telemed visit. Refill opioid medications.  02/13/2020 - Pain management - telemed visit. Refill opioid medications.   Medications: Outpatient Encounter Medications as of 11/01/2020  Medication Sig  . Semaglutide, 1 MG/DOSE, (OZEMPIC, 1 MG/DOSE,) 2 MG/1.5ML SOPN Inject 1 mg into the skin once a week.  [name] acetaminophen (TYLENOL) 500 MG tablet Take 1,000 mg by mouth every 6 (six) hours as needed for moderate pain or headache.  [name] amiodarone (PACERONE) 200 MG tablet Take 1 tablet (200 mg total) by mouth daily. (Patient taking differently: Take 200 mg by mouth every evening. )  . amoxicillin-clavulanate (AUGMENTIN) 875-125 MG tablet Take 1 tablet by mouth 2 (two) times daily.  [name] apixaban  (ELIQUIS) 5 MG TABS tablet Take 5 mg by mouth 2 (two) times daily.   [name] b complex vitamins tablet Take 1 tablet by mouth daily.  . BD PEN NEEDLE MICRO U/F 32G X 6 MM MISC SMARTSIG:1 Needle SUB-Q Daily  . Budeson-Glycopyrrol-Formoterol (BREZTRI AEROSPHERE) 160-9-4.8 MCG/ACT AERO Inhale 2 puffs into the lungs 2 (two) times daily.  . Catheters (FREEDOM CATH MALE EXT CATHETER) MISC 1 each by Does not apply route daily.  . cholecalciferol (VITAMIN D3) 25 MCG (1000 UT) tablet Take 1,000 Units by mouth daily.   [name] CINNAMON PO Take 700 mg by mouth daily.   . Continuous Blood Gluc Receiver (FREESTYLE LIBRE 2 READER) DEVI 1 each by Does not apply route 4 (four) times daily - after meals and at bedtime.  . Continuous Blood Gluc Sensor (FREESTYLE LIBRE 2 SENSOR) MISC 2 each by Does not apply route every 14 (fourteen) days.  . Cyanocobalamin (B-12) 5000 MCG CAPS Take 5,000 mcg by mouth daily.   . folic acid (FOLVITE) 263 MCG tablet Take 800 mcg by mouth daily.  [name] HYDROcodone-acetaminophen (NORCO) 10-325 MG tablet Take 1 tablet by mouth every 4 (four) hours as needed for moderate pain.   [name] insulin degludec (TRESIBA FLEXTOUCH) 100 UNIT/ML FlexTouch Pen Inject 50 Units into the skin daily.  [name] ipratropium-albuterol (DUONEB) 0.5-2.5 (3) MG/3ML SOLN Take 3 mLs by nebulization every 6 (six) hours as needed (shortness of breath).   [name] levocetirizine (XYZAL) 5 MG tablet Take 5 mg by mouth daily as needed for allergies.  . magnesium chloride (SLOW-MAG) 64 MG TBEC SR tablet Take 1 tablet (64 mg total) by mouth 2 (two) times daily. (Patient taking differently:  Take 1 tablet by mouth daily. )  . metolazone (ZAROXOLYN) 5 MG tablet Take 5 mg by mouth once a week. Uses prn if swollen once weekly.  . metoprolol tartrate (LOPRESSOR) 50 MG tablet Take 1 tablet (50 mg total) by mouth every 6 (six) hours. (Patient taking differently: Take 50 mg by mouth in the morning, at noon, and at bedtime. )  . montelukast (SINGULAIR) 10 MG tablet  Take 1 tablet (10 mg total) by mouth every evening.  . Omeprazole 20 MG TBEC Take 20 mg by mouth 2 (two) times daily before a meal.   . potassium chloride SA (K-DUR) 20 MEQ tablet Take 20 mEq by mouth 2 (two) times daily.  [name] pyridOXINE (VITAMIN B-6) 100 MG tablet Take 100 mg by mouth daily.   [name] rOPINIRole (REQUIP) 0.5 MG tablet TAKE 1 TABLET BY MOUTH AT BEDTIME  . rosuvastatin (CRESTOR) 20 MG tablet Take 1 tablet (20 mg total) by mouth daily. (Patient taking differently: Take 20 mg by mouth at bedtime. )  . Semaglutide,0.25 or 0.5MG/DOS, (OZEMPIC, 0.25 OR 0.5 MG/DOSE,) 2 MG/1.5ML SOPN Inject into the skin.  [name] sertraline (ZOLOFT) 50 MG tablet Take 1 tablet by mouth twice daily  . tamsulosin (FLOMAX) 0.4 MG CAPS capsule TAKE 1 CAPSULE BY MOUTH BEFORE BEDTIME  . torsemide (DEMADEX) 20 MG tablet Take 1 tablet (20 mg total) by mouth 2 (two) times daily. Take an extra tablet (20 mg) in the afternoon on Monday, Wednesday, Friday. (Patient taking differently: Take 40 mg by mouth 2 (two) times daily. 2 in the am and 2 at noon)  . VENTOLIN HFA 108 (90 Base) MCG/ACT inhaler Inhale 2 puffs into the lungs every 4 (four) hours as needed for wheezing or shortness of breath.   No facility-administered encounter medications on file as of 11/01/2020.   Allergies  Allergen Reactions  . Androgel [Testosterone] Other (See Comments)    Pedal edema   . Biaxin [Clarithromycin] Other (See Comments)    Taste perception alteration  . Duloxetine Other (See Comments)    Hallucinations  . Gabapentin Other (See Comments)    hallucinations  . Ibuprofen Other (See Comments)    GI upset   . Lyrica [Pregabalin] Swelling  . Spironolactone Other (See Comments)   SDOH Screenings   Alcohol Screen:   . Last Alcohol Screening Score (AUDIT): Not on file  Depression (PHQ2-9):   . PHQ-2 Score: Not on file  Financial Resource Strain:   . Difficulty of Paying Living Expenses: Not on file  Food Insecurity: No Food Insecurity   . Worried About Charity fundraiser in the Last Year: Never true  . Ran Out of Food in the Last Year: Never true  Housing: Low Risk   . Last Housing Risk Score: 0  Physical Activity: Inactive  . Days of Exercise per Week: 0 days  . Minutes of Exercise per Session: 0 min  Social Connections:   . Frequency of Communication with Friends and Family: Not on file  . Frequency of Social Gatherings with Friends and Family: Not on file  . Attends Religious Services: Not on file  . Active Member of Clubs or Organizations: Not on file  . Attends Archivist Meetings: Not on file  . Marital Status: Not on file  Stress:   . Feeling of Stress : Not on file  Tobacco Use: Medium Risk  . Smoking Tobacco Use: Former Smoker  . Smokeless Tobacco Use: Never Used  Transportation Needs:   .  Lack of Transportation (Medical): Not on file  . Lack of Transportation (Non-Medical): Not on file     Current Diagnosis/Assessment:  Goals Addressed            This Visit's Progress   . Pharmacy Care Plan       CARE PLAN ENTRY (see longitudinal plan of care for additional care plan information)  Current Barriers:  . Chronic Disease Management support, education, and care coordination needs related to Hypertension, Hyperlipidemia, Diabetes, and COPD   Hypertension BP Readings from Last 3 Encounters:  07/18/20 140/70  07/15/20 140/70  06/28/20 (!) 138/72   . Pharmacist Clinical Goal(s): o Over the next 90 days, patient will work with PharmD and providers to achieve BP goal <130/80 . Current regimen:  o Metoprolol 50 mg three times daily o Torsemide 40 mg twice daily o Metolazone 5 mg once weekly as needed for swelling . Interventions: o Discussed benefits of exercise. Increase activity as tolerated to goal of 150 minutes each week beginning with 5-10 minutes each day.  . Patient self care activities - Over the next 90 days, patient will: o Check BP weekly, document, and provide at future  appointments o Ensure daily salt intake < 2300 mg/day  Hyperlipidemia Lab Results  Component Value Date/Time   LDLCALC 87 05/31/2020 10:48 AM   . Pharmacist Clinical Goal(s): o Over the next 90 days, patient will work with PharmD and providers to achieve LDL goal < 70 . Current regimen:  o Rosuvastatin 20 mg daily o Omega-3 Fish Oil 2000 mg twice daily  . Interventions: o Recommend patient incorporate daily exercise with goal of 150 minutes. o Continue work on [employer] of lean meats, vegetables and fruit and whole grains in moderation.   o Continue taking medication as prescribed.  o Consider increasing rosuvastatin if lipid panel is not at goal. . Patient self care activities - Over the next 90 days, patient will: o Take medication as prescribed.  o Contact pharmacist or provider with any questions.   Diabetes Lab Results  Component Value Date/Time   HGBA1C 12.0 (H) 05/31/2020 10:48 AM   HGBA1C 9.0 (H) 02/23/2020 10:56 AM   . Pharmacist Clinical Goal(s): o Over the next 90 days, patient will work with PharmD and providers to achieve A1c goal <7% . Current regimen:  o Ozempic 1 mg weekly  o Tresiba 50 units daily.  . Interventions: o Reviewed home blood sugar readings.  o Discussed current Diabetes regimen.  o Coordinating patient assistance for 2022.  o Discussed appropriate ways to correct low blood sugar.  o Discussed CGM and requesting order to [facility] to determine coverage.  o Discussed benefits of optimal blood sugar control. o Encouraged patient to come in for updated blood work once feeling better.  . Patient self care activities - Over the next 90 days, patient will: o Check blood sugar once daily, document, and provide at future appointments o Contact provider with any episodes of hypoglycemia  COPD . Pharmacist Clinical Goal(s) o Over the next 90 days, patient will work with PharmD and providers to improve adherence with maintenance regimen.  . Current  regimen:  o Breztri 2 puffs twice daily o Montelukast 10 mg every evening o Ventolin 2 puffs every 4 hours prn shortness of breath or wheezing . Interventions: o Breztri approved through patient assistance. Updated attestation for 2022 renewal.  o Recommend using maintenance inhaler every day to prevent use of rescue inhaler or exacerbations.  . Patient self  care activities - Over the next 90 days, patient will: o Use maintenance inhaler daily.  o Complete Patient Assistance for Breztri to improve access to care.   Medication management . Pharmacist Clinical Goal(s): o Over the next 90 days, patient will work with PharmD and providers to achieve optimal medication adherence . Current pharmacy: [facility] . Interventions o Comprehensive medication review performed. o Continue current medication management strategy . Patient self care activities - Over the next 90 days, patient will: o Focus on medication adherence by continuing to use pill box.  o Take medications as prescribed o Report any questions or concerns to PharmD and/or provider(s)  Please see past updates related to this goal by clicking on the "Past Updates" button in the selected goal         AFIB   Patient is currently rhythm controlled. HR 77 BPM  Patient has failed these meds in past: n/a Patient is currently controlled on the following medications:   Amiodarone 200 mg daily  Eliquis 5 mg bid  We discussed:  Patient is receiving Eliquis through patient assistance. Pharmacist will coordinate renewal of medication for 2022 once out of pocket requirement met.   Plan  Continue current medications  ,  COPD / Asthma / Tobacco   Eosinophil count:  No results found for: EOSPCT%                               Eos (Absolute):  Lab Results  Component Value Date/Time   EOSABS 0.2 07/18/2020 02:32 PM    Tobacco Status:  Social History   Tobacco Use  Smoking Status Former Smoker  . Quit date: 21   . Years since quitting: 28.9  Smokeless Tobacco Never Used    Patient has failed these meds in past: Breo ellipta Patient is currently controlled on the following medications:   Breztri 2 puffs twice daily  Duoneb every 6 hours prn shortness of breath  Levoceterizine 5 mg daily prn allergies  Montelukast 10 mg daily every evening  Ventolin 2 puffs every 4 hours prn shortness of breath wheezing   Using maintenance inhaler regularly? Yes Frequency of rescue inhaler use:  1-2x per week  We discussed:  proper inhaler technique. Patient reports good control of symptoms with Breztri. Pharmacist coordinated online attestation for Breztri 2022 renewal with daughter.   Plan  Continue current medications.  Diabetes   Recent Relevant Labs: Lab Results  Component Value Date/Time   HGBA1C 12.0 (H) 05/31/2020 10:48 AM   HGBA1C 9.0 (H) 02/23/2020 10:56 AM   MICROALBUR 150 05/31/2020 11:09 AM     Kidney Function Lab Results  Component Value Date/Time   CREATININE 1.24 07/18/2020 02:32 PM   CREATININE 1.32 (H) 05/31/2020 10:48 AM   GFRNONAA 56 (L) 07/18/2020 02:32 PM   GFRAA 65 07/18/2020 02:32 PM   K 4.4 07/18/2020 02:32 PM   K 4.8 05/31/2020 10:48 AM    Checking BG: Daily  Recent FBG Readings: 160 After meal Blood Sugar: 245    Patient has failed these meds in past: Tresiba, Victoza, metformin Patient is currently uncontrolled on the following medications:   Tresiba 50 units   Ozempic 1 mg weekly  BD Pen needle 32gx67m use with TTyler Aas Last diabetic Foot exam: July 2021 Last diabetic Eye exam: Daughter uncertain of exact date but has been completed in the last year.   We discussed: diet and exercise extensively.  Freestyle Elenor Legato was going to be $495. Patient declined order due to cost. Patient has been sick and has not completed his blood work. With next A1c, please consider adding Farxiga 5 mg daily.   Patient's blood sugars remain above goal. Patient reports  taking medication as prescribed. Patient is unable to exercise.   Plan   Recommend consider adding Farxiga 5 mg daily to regimen to improve blood sugar.    Hyperlipidemia   LDL goal < 70  Lipid Panel     Component Value Date/Time   CHOL 153 05/31/2020 1048   TRIG 173 (H) 05/31/2020 1048   HDL 36 (L) 05/31/2020 1048   LDLCALC 87 05/31/2020 1048    Hepatic Function Latest Ref Rng & Units 07/18/2020 05/31/2020 03/19/2020  Total Protein 6.0 - 8.5 g/dL - 6.2 6.6  Albumin 3.7 - 4.7 g/dL 3.7 3.6(L) 4.3  AST 0 - 40 IU/L - 33 44(H)  ALT 0 - 44 IU/L - 22 36  Alk Phosphatase 48 - 121 IU/L - 140(H) 129(H)  Total Bilirubin 0.0 - 1.2 mg/dL - 0.2 1.0     The 10-year ASCVD risk score Mikey Bussing DC Jr., et al., 2013) is: 67.2%   Values used to calculate the score:     Age: 23 years     Sex: Male     Is Non-Hispanic African American: No     Diabetic: Yes     Tobacco smoker: No     Systolic Blood Pressure: 357 mmHg     Is BP treated: Yes     HDL Cholesterol: 36 mg/dL     Total Cholesterol: 153 mg/dL   Patient has failed these meds in past: atorvastatin Patient is currently uncontrolled on the following medications:  . Rosuvastatin 20 mg daily . Omega-3 2000 mg bid  We discussed:  diet and exercise extensively. Patient is scheduled to come in for blood work but has been sick. Would like to consider increasing Crestor to 40 mg daily if patient's cholesterol is not at goal.   Plan  Continue current medications. Consider increase in Crestor after next blood work.     Vaccines   Reviewed and discussed patient's vaccination history.    Coordinated patient an appointment for Covid booster shot.   Immunization History  Administered Date(s) Administered  . Fluad Quad(high Dose 65+) 10/11/2020  . Influenza-Unspecified 08/16/2019  . Moderna SARS-COVID-2 Vaccination 12/22/2019, 01/19/2020  . Pneumococcal Conjugate-13 07/26/2014  . Pneumococcal Polysaccharide-23 08/19/2012     Plan  Recommended patient receive Covid booster in ofifce.   Medication Management   Pt uses [facility] for all medications Uses pill box? Yes Pt endorses fair compliance  We discussed: Patient reports that he has plenty of his medication through patient assistance. Jenny Reichmann continues to pick up his medication at [facility] for now. Will revisit option of delivery/packaging in January.   Plan  Continue current medication management strategy    Follow up: 2 month phone visit

## 2020-11-01 ENCOUNTER — Ambulatory Visit: Payer: Medicare HMO

## 2020-11-01 ENCOUNTER — Telehealth: Payer: Medicare HMO

## 2020-11-01 ENCOUNTER — Other Ambulatory Visit: Payer: Self-pay

## 2020-11-01 DIAGNOSIS — E782 Mixed hyperlipidemia: Secondary | ICD-10-CM

## 2020-11-01 DIAGNOSIS — E1121 Type 2 diabetes mellitus with diabetic nephropathy: Secondary | ICD-10-CM

## 2020-11-04 NOTE — Patient Instructions (Addendum)
Visit Information  Goals Addressed            This Visit's Progress   . Pharmacy Care Plan       CARE PLAN ENTRY (see longitudinal plan of care for additional care plan information)  Current Barriers:  . Chronic Disease Management support, education, and care coordination needs related to Hypertension, Hyperlipidemia, Diabetes, and COPD   Hypertension BP Readings from Last 3 Encounters:  07/18/20 140/70  07/15/20 140/70  06/28/20 (!) 138/72   . Pharmacist Clinical Goal(s): o Over the next 90 days, patient will work with PharmD and providers to achieve BP goal <130/80 . Current regimen:  o Metoprolol 50 mg three times daily o Torsemide 40 mg twice daily o Metolazone 5 mg once weekly as needed for swelling . Interventions: o Discussed benefits of exercise. Increase activity as tolerated to goal of 150 minutes each week beginning with 5-10 minutes each day.  . Patient self care activities - Over the next 90 days, patient will: o Check BP weekly, document, and provide at future appointments o Ensure daily salt intake < 2300 mg/day  Hyperlipidemia Lab Results  Component Value Date/Time   LDLCALC 87 05/31/2020 10:48 AM   . Pharmacist Clinical Goal(s): o Over the next 90 days, patient will work with PharmD and providers to achieve LDL goal < 70 . Current regimen:  o Rosuvastatin 20 mg daily o Omega-3 Fish Oil 2000 mg twice daily  . Interventions: o Recommend patient incorporate daily exercise with goal of 150 minutes. o Continue work on [employer] of lean meats, vegetables and fruit and whole grains in moderation.   o Continue taking medication as prescribed.  o Consider increasing rosuvastatin if lipid panel is not at goal. . Patient self care activities - Over the next 90 days, patient will: o Take medication as prescribed.  o Contact pharmacist or provider with any questions.   Diabetes Lab Results  Component Value Date/Time   HGBA1C 12.0 (H) 05/31/2020 10:48 AM    HGBA1C 9.0 (H) 02/23/2020 10:56 AM   . Pharmacist Clinical Goal(s): o Over the next 90 days, patient will work with PharmD and providers to achieve A1c goal <7% . Current regimen:  o Ozempic 1 mg weekly  o Tresiba 50 units daily.  . Interventions: o Reviewed home blood sugar readings.  o Discussed current Diabetes regimen.  o Coordinating patient assistance for 2022.  o Discussed appropriate ways to correct low blood sugar.  o Discussed CGM and requesting order to [facility] to determine coverage.  o Discussed benefits of optimal blood sugar control. o Encouraged patient to come in for updated blood work once feeling better.  . Patient self care activities - Over the next 90 days, patient will: o Check blood sugar once daily, document, and provide at future appointments o Contact provider with any episodes of hypoglycemia  COPD . Pharmacist Clinical Goal(s) o Over the next 90 days, patient will work with PharmD and providers to improve adherence with maintenance regimen.  . Current regimen:  o Breztri 2 puffs twice daily o Montelukast 10 mg every evening o Ventolin 2 puffs every 4 hours prn shortness of breath or wheezing . Interventions: o Breztri approved through patient assistance. Updated attestation for 2022 renewal.  o Recommend using maintenance inhaler every day to prevent use of rescue inhaler or exacerbations.  . Patient self care activities - Over the next 90 days, patient will: o Use maintenance inhaler daily.  o Complete Patient Assistance for  Breztri to improve access to care.   Medication management . Pharmacist Clinical Goal(s): o Over the next 90 days, patient will work with PharmD and providers to achieve optimal medication adherence . Current pharmacy: [facility] . Interventions o Comprehensive medication review performed. o Continue current medication management strategy . Patient self care activities - Over the next 90 days, patient will: o Focus on  medication adherence by continuing to use pill box.  o Take medications as prescribed o Report any questions or concerns to PharmD and/or provider(s)  Please see past updates related to this goal by clicking on the "Past Updates" button in the selected goal         The patient verbalized understanding of instructions, educational materials, and care plan provided today and declined offer to receive copy of patient instructions, educational materials, and care plan.   Telephone follow up appointment with pharmacy team member scheduled for: 12/2020  [facility]  Diabetes Mellitus and Nutrition, Adult When you have diabetes (diabetes mellitus), it is very important to have healthy eating habits because your blood sugar (glucose) levels are greatly affected by what you eat and drink. Eating healthy foods in the appropriate amounts, at about the same times every day, can help you:  Control your blood glucose.  Lower your risk of heart disease.  Improve your blood pressure.  Reach or maintain a healthy weight. Every person with diabetes is different, and each person has different needs for a meal plan. Your health care provider may recommend that you work with a diet and nutrition specialist (dietitian) to make a meal plan that is best for you. Your meal plan may vary depending on factors such as:  The calories you need.  The medicines you take.  Your weight.  Your blood glucose, blood pressure, and cholesterol levels.  Your activity level.  Other health conditions you have, such as heart or kidney disease. How do carbohydrates affect me? Carbohydrates, also called carbs, affect your blood glucose level more than any other type of food. Eating carbs naturally raises the amount of glucose in your blood. Carb counting is a method for keeping track of how many carbs you eat. Counting carbs is important to keep your blood glucose at a healthy level, especially if you use insulin  or take certain oral diabetes medicines. It is important to know how many carbs you can safely have in each meal. This is different for every person. Your dietitian can help you calculate how many carbs you should have at each meal and for each snack. Foods that contain carbs include:  Bread, cereal, rice, pasta, and crackers.  Potatoes and corn.  Peas, beans, and lentils.  Milk and yogurt.  Fruit and juice.  Desserts, such as cakes, cookies, ice cream, and candy. How does alcohol affect me? Alcohol can cause a sudden decrease in blood glucose (hypoglycemia), especially if you use insulin or take certain oral diabetes medicines. Hypoglycemia can be a life-threatening condition. Symptoms of hypoglycemia (sleepiness, dizziness, and confusion) are similar to symptoms of having too much alcohol. If your health care provider says that alcohol is safe for you, follow these guidelines:  Limit alcohol intake to no more than 1 drink per day for nonpregnant women and 2 drinks per day for men. One drink equals 12 oz of beer, 5 oz of wine, or 1 oz of hard liquor.  Do not drink on an empty stomach.  Keep yourself hydrated with water, diet soda, or  unsweetened iced tea.  Keep in mind that regular soda, juice, and other mixers may contain a lot of sugar and must be counted as carbs. What are tips for following this plan?  Reading food labels  Start by checking the serving size on the "Nutrition Facts" label of packaged foods and drinks. The amount of calories, carbs, fats, and other nutrients listed on the label is based on one serving of the item. Many items contain more than one serving per package.  Check the total grams (g) of carbs in one serving. You can calculate the number of servings of carbs in one serving by dividing the total carbs by 15. For example, if a food has 30 g of total carbs, it would be equal to 2 servings of carbs.  Check the number of grams (g) of saturated and trans fats  in one serving. Choose foods that have low or no amount of these fats.  Check the number of milligrams (mg) of salt (sodium) in one serving. Most people should limit total sodium intake to less than 2,300 mg per day.  Always check the nutrition information of foods labeled as "low-fat" or "nonfat". These foods may be higher in added sugar or refined carbs and should be avoided.  Talk to your dietitian to identify your daily goals for nutrients listed on the label. Shopping  Avoid buying canned, premade, or processed foods. These foods tend to be high in fat, sodium, and added sugar.  Shop around the outside edge of the grocery store. This includes fresh fruits and vegetables, bulk grains, fresh meats, and fresh dairy. Cooking  Use low-heat cooking methods, such as baking, instead of high-heat cooking methods like deep frying.  Cook using healthy oils, such as olive, canola, or sunflower oil.  Avoid cooking with butter, cream, or high-fat meats. Meal planning  Eat meals and snacks regularly, preferably at the same times every day. Avoid going long periods of time without eating.  Eat foods high in fiber, such as fresh fruits, vegetables, beans, and whole grains. Talk to your dietitian about how many servings of carbs you can eat at each meal.  Eat 4-6 ounces (oz) of lean protein each day, such as lean meat, chicken, fish, eggs, or tofu. One oz of lean protein is equal to: ? 1 oz of meat, chicken, or fish. ? 1 egg. ?  cup of tofu.  Eat some foods each day that contain healthy fats, such as avocado, nuts, seeds, and fish. Lifestyle  Check your blood glucose regularly.  Exercise regularly as told by your health care provider. This may include: ? 150 minutes of moderate-intensity or vigorous-intensity exercise each week. This could be brisk walking, biking, or water aerobics. ? Stretching and doing strength exercises, such as yoga or weightlifting, at least 2 times a week.  Take  medicines as told by your health care provider.  Do not use any products that contain nicotine or tobacco, such as cigarettes and e-cigarettes. If you need help quitting, ask your health care provider.  Work with a Social worker or diabetes educator to identify strategies to manage stress and any emotional and social challenges. Questions to ask a health care provider  Do I need to meet with a diabetes educator?  Do I need to meet with a dietitian?  What number can I call if I have questions?  When are the best times to check my blood glucose? Where to find more information:  American Diabetes Association: diabetes.org  Academy  of Nutrition and Dietetics: www.eatright.[employer] of Diabetes and Digestive and Kidney Diseases (NIH): [employer] Summary  A healthy meal plan will help you control your blood glucose and maintain a healthy lifestyle.  Working with a diet and nutrition specialist (dietitian) can help you make a meal plan that is best for you.  Keep in mind that carbohydrates (carbs) and alcohol have immediate effects on your blood glucose levels. It is important to count carbs and to use alcohol carefully. This information is not intended to replace advice given to you by your health care provider. Make sure you discuss any questions you have with your health care provider. Document Revised: 10/29/2017 Document Reviewed: 12/21/2016 Elsevier Patient Education  2020 [employer].

## 2020-11-13 NOTE — Progress Notes (Signed)
This encounter was created in error - please disregard.

## 2020-11-21 ENCOUNTER — Other Ambulatory Visit: Payer: Self-pay | Admitting: Physician Assistant

## 2020-11-26 ENCOUNTER — Ambulatory Visit: Payer: Medicare HMO

## 2020-12-02 ENCOUNTER — Telehealth: Payer: Self-pay

## 2020-12-02 NOTE — Progress Notes (Signed)
Chronic Care Management Pharmacy Assistant   Name: Shawn Sullivan  MRN: [id number] DOB: 1945-12-18  Reason for Encounter:  Pap forms for Ozempic and Tresiba.  Completed PAP forms for [name] and Ozempic, spoke to patient wife and notified her that the forms had been sent to the pharmacist for signatures, and should be ready for their daughter to sign in a couple of days.  I reminded the patients wife to bring proof of income as it is needed to complete the form.  Forms were sent to the [facility] for review and signatures.    PCP : Blane Ohara, MD  Allergies:   Allergies  Allergen Reactions   Androgel [Testosterone] Other (See Comments)    Pedal edema    Biaxin [Clarithromycin] Other (See Comments)    Taste perception alteration   Duloxetine Other (See Comments)    Hallucinations   Gabapentin Other (See Comments)    hallucinations   Ibuprofen Other (See Comments)    GI upset    Lyrica [Pregabalin] Swelling   Spironolactone Other (See Comments)    Medications: Outpatient Encounter Medications as of 12/02/2020  Medication Sig   acetaminophen (TYLENOL) 500 MG tablet Take 1,000 mg by mouth every 6 (six) hours as needed for moderate pain or headache.   amiodarone (PACERONE) 200 MG tablet Take 1 tablet (200 mg total) by mouth daily. (Patient taking differently: Take 200 mg by mouth every evening. )   amoxicillin-clavulanate (AUGMENTIN) 875-125 MG tablet Take 1 tablet by mouth 2 (two) times daily.   apixaban (ELIQUIS) 5 MG TABS tablet Take 5 mg by mouth 2 (two) times daily.    b complex vitamins tablet Take 1 tablet by mouth daily.   BD PEN NEEDLE MICRO U/F 32G X 6 MM MISC SMARTSIG:1 Needle SUB-Q Daily   Budeson-Glycopyrrol-Formoterol (BREZTRI AEROSPHERE) 160-9-4.8 MCG/ACT AERO Inhale 2 puffs into the lungs 2 (two) times daily.   Catheters (FREEDOM CATH MALE EXT CATHETER) MISC 1 each by Does not apply route daily.   cholecalciferol (VITAMIN D3) 25 MCG (1000 UT)  tablet Take 1,000 Units by mouth daily.    CINNAMON PO Take 700 mg by mouth daily.    Continuous Blood Gluc Receiver (FREESTYLE LIBRE 2 READER) DEVI 1 each by Does not apply route 4 (four) times daily - after meals and at bedtime.   Continuous Blood Gluc Sensor (FREESTYLE LIBRE 2 SENSOR) MISC 2 each by Does not apply route every 14 (fourteen) days.   Cyanocobalamin (B-12) 5000 MCG CAPS Take 5,000 mcg by mouth daily.    folic acid (FOLVITE) 800 MCG tablet Take 800 mcg by mouth daily.   HYDROcodone-acetaminophen (NORCO) 10-325 MG tablet Take 1 tablet by mouth every 4 (four) hours as needed for moderate pain.    insulin degludec (TRESIBA FLEXTOUCH) 100 UNIT/ML FlexTouch Pen Inject 50 Units into the skin daily.   ipratropium-albuterol (DUONEB) 0.5-2.5 (3) MG/3ML SOLN Take 3 mLs by nebulization every 6 (six) hours as needed (shortness of breath).    levocetirizine (XYZAL) 5 MG tablet Take 5 mg by mouth daily as needed for allergies.   magnesium chloride (SLOW-MAG) 64 MG TBEC SR tablet Take 1 tablet (64 mg total) by mouth 2 (two) times daily. (Patient taking differently: Take 1 tablet by mouth daily. )   metolazone (ZAROXOLYN) 5 MG tablet Take 5 mg by mouth once a week. Uses prn if swollen once weekly.   metoprolol tartrate (LOPRESSOR) 50 MG tablet Take 1 tablet (50 mg total) by mouth every  6 (six) hours. (Patient taking differently: Take 50 mg by mouth in the morning, at noon, and at bedtime. )   montelukast (SINGULAIR) 10 MG tablet Take 1 tablet (10 mg total) by mouth every evening.   Omeprazole 20 MG TBEC Take 20 mg by mouth 2 (two) times daily before a meal.    potassium chloride SA (K-DUR) 20 MEQ tablet Take 20 mEq by mouth 2 (two) times daily.   pyridOXINE (VITAMIN B-6) 100 MG tablet Take 100 mg by mouth daily.    rOPINIRole (REQUIP) 0.5 MG tablet TAKE 1 TABLET BY MOUTH AT BEDTIME   rosuvastatin (CRESTOR) 20 MG tablet Take 1 tablet by mouth once daily   Semaglutide, 1 MG/DOSE,  (OZEMPIC, 1 MG/DOSE,) 2 MG/1.5ML SOPN Inject 1 mg into the skin once a week.   Semaglutide,0.25 or 0.5MG /DOS, (OZEMPIC, 0.25 OR 0.5 MG/DOSE,) 2 MG/1.5ML SOPN Inject into the skin.   sertraline (ZOLOFT) 50 MG tablet Take 1 tablet by mouth twice daily   tamsulosin (FLOMAX) 0.4 MG CAPS capsule TAKE 1 CAPSULE BY MOUTH BEFORE BEDTIME   torsemide (DEMADEX) 20 MG tablet Take 1 tablet (20 mg total) by mouth 2 (two) times daily. Take an extra tablet (20 mg) in the afternoon on Monday, Wednesday, Friday. (Patient taking differently: Take 40 mg by mouth 2 (two) times daily. 2 in the am and 2 at noon)   VENTOLIN HFA 108 (90 Base) MCG/ACT inhaler Inhale 2 puffs into the lungs every 4 (four) hours as needed for wheezing or shortness of breath.   No facility-administered encounter medications on file as of 12/02/2020.    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) 06/14/2019   Persistent atrial fibrillation (HCC) 06/14/2019   Medication management 04/19/2019   On amiodarone therapy 03/24/2019   Polyneuropathy associated with underlying disease (HCC) 10/11/2018   Hypokalemia 10/04/2018   PAF (paroxysmal atrial fibrillation) (HCC) 07/01/2018   Anticoagulated 07/01/2018   CKD (chronic kidney disease) stage 3, GFR 30-59 ml/min (HCC) 07/01/2018   COPD (chronic obstructive  pulmonary disease) (HCC) 07/01/2018   Obstructive sleep apnea 07/01/2018   Edema of both lower extremities 06/20/2018   Shortness of breath 06/20/2018   Obesity, morbid, BMI 40.0-49.9 (HCC) 10/04/2017   Chronic pain syndrome 08/30/2017   Restrictive lung disease 06/22/2016   Generalized edema 06/22/2016   Restless legs syndrome 03/23/2016   Presbyopia of both eyes 12/16/2015   Hyperlipidemia 12/16/2015   Bilateral pseudophakia 12/16/2015   Chronic obstructive pulmonary disease (HCC) 11/05/2015   Type 2 diabetes mellitus without complications (HCC) 11/05/2015   Edema 11/04/2015   Piriformis syndrome 05/03/2014   Disorder of bursae and tendons in shoulder region 01/05/2014   Low back pain 08/17/2013   Sleep apnea 05/06/2013   Postlaminectomy syndrome, lumbar region 05/06/2013   Lumbosacral spondylosis 05/06/2013   Depressive disorder 05/06/2013   Chronic pain 05/06/2013   Leilani Able, CMA Clinical Pharmacist Assistant [phone]   Follow-Up:  Pharmacist Review  [place]  [place], [place] notified

## 2020-12-03 ENCOUNTER — Other Ambulatory Visit: Payer: Self-pay | Admitting: Cardiology

## 2020-12-10 ENCOUNTER — Other Ambulatory Visit: Payer: Self-pay

## 2020-12-10 ENCOUNTER — Ambulatory Visit (INDEPENDENT_AMBULATORY_CARE_PROVIDER_SITE_OTHER): Payer: Medicare Other

## 2020-12-10 DIAGNOSIS — Z23 Encounter for immunization: Secondary | ICD-10-CM | POA: Diagnosis not present

## 2020-12-10 DIAGNOSIS — N1831 Chronic kidney disease, stage 3a: Secondary | ICD-10-CM | POA: Diagnosis not present

## 2020-12-10 DIAGNOSIS — I130 Hypertensive heart and chronic kidney disease with heart failure and stage 1 through stage 4 chronic kidney disease, or unspecified chronic kidney disease: Secondary | ICD-10-CM | POA: Diagnosis not present

## 2020-12-10 DIAGNOSIS — E782 Mixed hyperlipidemia: Secondary | ICD-10-CM

## 2020-12-10 DIAGNOSIS — I5032 Chronic diastolic (congestive) heart failure: Secondary | ICD-10-CM

## 2020-12-10 DIAGNOSIS — E1121 Type 2 diabetes mellitus with diabetic nephropathy: Secondary | ICD-10-CM

## 2020-12-10 NOTE — Progress Notes (Signed)
   Covid-19 Vaccination Clinic  Name:  Shawn Sullivan    MRN: [id number] DOB: Aug 10, 1946  12/10/2020  Mr. Shawn Sullivan was observed post Covid-19 immunization for 15 minutes without incident. He was provided with Vaccine Information Sheet and instruction to access the V-Safe system.   Mr. Shawn Sullivan was instructed to call 911 with any severe reactions post vaccine: [name] Difficulty breathing  . Swelling of face and throat  . A fast heartbeat  . A bad rash all over body  . Dizziness and weakness   Immunizations Administered    Name Date Dose VIS Date Route   Moderna Covid-19 Booster Vaccine 12/10/2020  1:30 PM 0.25 mL 09/18/2020 Intramuscular   Manufacturer: Moderna   Lot: [id number]   [place]: [id number]

## 2020-12-11 LAB — COMPREHENSIVE METABOLIC PANEL
ALT: 23 IU/L (ref 0–44)
AST: 35 IU/L (ref 0–40)
Albumin/Globulin Ratio: 1.5 (ref 1.2–2.2)
Albumin: 3.9 g/dL (ref 3.7–4.7)
Alkaline Phosphatase: 173 IU/L — ABNORMAL HIGH (ref 44–121)
BUN/Creatinine Ratio: 13 (ref 10–24)
BUN: 17 mg/dL (ref 8–27)
Bilirubin Total: 0.7 mg/dL (ref 0.0–1.2)
CO2: 28 mmol/L (ref 20–29)
Calcium: 10.1 mg/dL (ref 8.6–10.2)
Chloride: 98 mmol/L (ref 96–106)
Creatinine, Ser: 1.32 mg/dL — ABNORMAL HIGH (ref 0.76–1.27)
GFR calc Af Amer: 60 mL/min/{1.73_m2} (ref >59)
GFR calc non Af Amer: 52 mL/min/{1.73_m2} — ABNORMAL LOW (ref >59)
Globulin, Total: 2.6 g/dL (ref 1.5–4.5)
Glucose: 149 mg/dL — ABNORMAL HIGH (ref 65–99)
Potassium: 4.7 mmol/L (ref 3.5–5.2)
Sodium: 137 mmol/L (ref 134–144)
Total Protein: 6.5 g/dL (ref 6.0–8.5)

## 2020-12-11 LAB — LIPID PANEL
Chol/HDL Ratio: 3.6 ratio (ref 0.0–5.0)
Cholesterol, Total: 126 mg/dL (ref 100–199)
HDL: 35 mg/dL — ABNORMAL LOW (ref >39)
LDL Chol Calc (NIH): 65 mg/dL (ref 0–99)
Triglycerides: 148 mg/dL (ref 0–149)
VLDL Cholesterol Cal: 26 mg/dL (ref 5–40)

## 2020-12-11 LAB — CBC WITH DIFFERENTIAL/PLATELET
Basophils Absolute: 0.1 10*3/uL (ref 0.0–0.2)
Basos: 1 %
EOS (ABSOLUTE): 0.3 10*3/uL (ref 0.0–0.4)
Eos: 3 %
Hematocrit: 41.4 % (ref 37.5–51.0)
Hemoglobin: 13.2 g/dL (ref 13.0–17.7)
Immature Grans (Abs): 0 10*3/uL (ref 0.0–0.1)
Immature Granulocytes: 0 %
Lymphocytes Absolute: 2.1 10*3/uL (ref 0.7–3.1)
Lymphs: 18 %
MCH: 27.8 pg (ref 26.6–33.0)
MCHC: 31.9 g/dL (ref 31.5–35.7)
MCV: 87 fL (ref 79–97)
Monocytes Absolute: 0.8 10*3/uL (ref 0.1–0.9)
Monocytes: 6 %
Neutrophils Absolute: 8.8 10*3/uL — ABNORMAL HIGH (ref 1.4–7.0)
Neutrophils: 72 %
Platelets: 270 10*3/uL (ref 150–450)
RBC: 4.75 x10E6/uL (ref 4.14–5.80)
RDW: 13.3 % (ref 11.6–15.4)
WBC: 12.1 10*3/uL — ABNORMAL HIGH (ref 3.4–10.8)

## 2020-12-11 LAB — HEMOGLOBIN A1C
Est. average glucose Bld gHb Est-mCnc: 223 mg/dL
Hgb A1c MFr Bld: 9.4 % — ABNORMAL HIGH (ref 4.8–5.6)

## 2020-12-11 LAB — CARDIOVASCULAR RISK ASSESSMENT

## 2020-12-12 ENCOUNTER — Other Ambulatory Visit: Payer: Self-pay | Admitting: Family Medicine

## 2020-12-12 MED ORDER — OZEMPIC (1 MG/DOSE) 2 MG/1.5ML ~~LOC~~ SOPN: 1.0000 mg | PEN_INJECTOR | SUBCUTANEOUS | 0 refills | Status: DC

## 2020-12-12 NOTE — Progress Notes (Signed)
Blood count abnormal. WBC worsened. Recheck at next appt.  Liver function normal Kidney function abnormal, but stable. Lipid panel at goal. Well controlled.  Diabetes improved. A1C improved from 12 to 9.4. Continue ozempic 1 mg once weekly, tresiba 50 mg once daily. Recommended farxiga 5 mg once daily.

## 2020-12-14 ENCOUNTER — Other Ambulatory Visit: Payer: Self-pay | Admitting: Cardiology

## 2020-12-26 ENCOUNTER — Telehealth: Payer: Self-pay

## 2020-12-26 NOTE — Chronic Care Management (AMB) (Signed)
Chronic Care Management Pharmacy  Name: Shawn Sullivan  MRN: [id number] DOB: 1946-01-10  Chief Complaint/ HPI  Shawn Sullivan,  75 y.o. , male presents for their Follow-Up CCM visit with the clinical pharmacist via telephone due to COVID-19 Pandemic.  PCP : Rochel Brome, MD   Plan Recommendations:   Diabetes - patient is willing to begin Farxiga 5 mg daily. Pharmacist requested printed prescription to send to [facility] [place]. Consider increasing to 10 mg daily if tolerating at follow-up visit 01/20/2021  Their chronic conditions include: afib, hypertensive heart and kidney disease, chronic diastolic congestive heart failure, ckd stage 3, COPD, OSA, GERD, Type 2 DM, restless legs syndrome, polyneuropathy, hyperlipidemia, depressive disorder, chronic pain, anxiety.   Office Visits: 12/10/2020 - Blood count abnormal. WBC worsened. Recheck at next appt. Liver function normal. Kidney function abnormal, but stable.Lipid panel at goal. Well controlled. Diabetes improved. A1C improved from 12 to 9.4. Continue ozempic 1 mg once weekly, tresiba 50 mg once daily. Recommended farxiga 5 mg once daily.  10/17/2020 - acute non-recurrent sinusitis. Augmentin prescribed.  10/11/2020 - flu vaccine given. Libre samples applied and order sent.  Consult Visit: 06/28/2020 - Cardiology visit - stable rhythm continue amiodarone. Heart failure compensated. Needs full thyroid panel and proBNP level at next labs.  05/15/2020 - Pain management - telemed visit. Refill opioid medications.  02/13/2020 - Pain management - telemed visit. Refill opioid medications.   Medications: Outpatient Encounter Medications as of 12/27/2020  Medication Sig  . acetaminophen (TYLENOL) 500 MG tablet Take 1,000 mg by mouth every 6 (six) hours as needed for moderate pain or headache.  [name] amiodarone (PACERONE) 200 MG tablet Take 1 tablet (200 mg total) by mouth every evening.  [name] amoxicillin-clavulanate (AUGMENTIN) 875-125 MG tablet  Take 1 tablet by mouth 2 (two) times daily.  [name] apixaban (ELIQUIS) 5 MG TABS tablet Take 5 mg by mouth 2 (two) times daily.   [name] b complex vitamins tablet Take 1 tablet by mouth daily.  . BD PEN NEEDLE MICRO U/F 32G X 6 MM MISC SMARTSIG:1 Needle SUB-Q Daily  . Budeson-Glycopyrrol-Formoterol (BREZTRI AEROSPHERE) 160-9-4.8 MCG/ACT AERO Inhale 2 puffs into the lungs 2 (two) times daily.  . Catheters (FREEDOM CATH MALE EXT CATHETER) MISC 1 each by Does not apply route daily.  . cholecalciferol (VITAMIN D3) 25 MCG (1000 UT) tablet Take 1,000 Units by mouth daily.   [name] CINNAMON PO Take 700 mg by mouth daily.   . Continuous Blood Gluc Receiver (FREESTYLE LIBRE 2 READER) DEVI 1 each by Does not apply route 4 (four) times daily - after meals and at bedtime.  . Continuous Blood Gluc Sensor (FREESTYLE LIBRE 2 SENSOR) MISC 2 each by Does not apply route every 14 (fourteen) days.  . Cyanocobalamin (B-12) 5000 MCG CAPS Take 5,000 mcg by mouth daily.   . folic acid (FOLVITE) 147 MCG tablet Take 800 mcg by mouth daily.  [name] HYDROcodone-acetaminophen (NORCO) 10-325 MG tablet Take 1 tablet by mouth every 4 (four) hours as needed for moderate pain.   [name] insulin degludec (TRESIBA FLEXTOUCH) 100 UNIT/ML FlexTouch Pen Inject 50 Units into the skin daily.  [name] ipratropium-albuterol (DUONEB) 0.5-2.5 (3) MG/3ML SOLN Take 3 mLs by nebulization every 6 (six) hours as needed (shortness of breath).   [name] levocetirizine (XYZAL) 5 MG tablet Take 5 mg by mouth daily as needed for allergies.  . magnesium chloride (SLOW-MAG) 64 MG TBEC SR tablet Take 1 tablet (64 mg total) by mouth 2 (two) times  daily. (Patient taking differently: Take 1 tablet by mouth daily. )  . metolazone (ZAROXOLYN) 5 MG tablet Take 5 mg by mouth once a week. Uses prn if swollen once weekly.  . metoprolol tartrate (LOPRESSOR) 50 MG tablet Take 1 tablet (50 mg total) by mouth in the morning, at noon, and at bedtime.  . montelukast (SINGULAIR) 10 MG tablet Take 1 tablet  (10 mg total) by mouth every evening.  . Omeprazole 20 MG TBEC Take 20 mg by mouth 2 (two) times daily before a meal.   . potassium chloride SA (K-DUR) 20 MEQ tablet Take 20 mEq by mouth 2 (two) times daily.  [name] pyridOXINE (VITAMIN B-6) 100 MG tablet Take 100 mg by mouth daily.   [name] rOPINIRole (REQUIP) 0.5 MG tablet TAKE 1 TABLET BY MOUTH AT BEDTIME  . rosuvastatin (CRESTOR) 20 MG tablet Take 1 tablet by mouth once daily  . Semaglutide, 1 MG/DOSE, (OZEMPIC, 1 MG/DOSE,) 2 MG/1.5ML SOPN Inject 1 mg into the skin once a week.  . sertraline (ZOLOFT) 50 MG tablet Take 1 tablet by mouth twice daily  . tamsulosin (FLOMAX) 0.4 MG CAPS capsule TAKE 1 CAPSULE BY MOUTH BEFORE BEDTIME  . torsemide (DEMADEX) 20 MG tablet Take 1 tablet (20 mg total) by mouth 2 (two) times daily. Take an extra tablet (20 mg) in the afternoon on Monday, Wednesday, Friday. (Patient taking differently: Take 40 mg by mouth 2 (two) times daily. 2 in the am and 2 at noon)  . VENTOLIN HFA 108 (90 Base) MCG/ACT inhaler Inhale 2 puffs into the lungs every 4 (four) hours as needed for wheezing or shortness of breath.   No facility-administered encounter medications on file as of 12/27/2020.   Allergies  Allergen Reactions  . Androgel [Testosterone] Other (See Comments)    Pedal edema   . Biaxin [Clarithromycin] Other (See Comments)    Taste perception alteration  . Duloxetine Other (See Comments)    Hallucinations  . Gabapentin Other (See Comments)    hallucinations  . Ibuprofen Other (See Comments)    GI upset   . Lyrica [Pregabalin] Swelling  . Spironolactone Other (See Comments)   SDOH Screenings   Alcohol Screen: Not on file  Depression (JHE1-7): Not on file  Financial Resource Strain: Not on file  Food Insecurity: No Food Insecurity  . Worried About Charity fundraiser in the Last Year: Never true  . Ran Out of Food in the Last Year: Never true  Housing: Low Risk   . Last Housing Risk Score: 0  Physical Activity:  Inactive  . Days of Exercise per Week: 0 days  . Minutes of Exercise per Session: 0 min  Social Connections: Not on file  Stress: Not on file  Tobacco Use: Medium Risk  . Smoking Tobacco Use: Former Smoker  . Smokeless Tobacco Use: Never Used  Transportation Needs: Not on file     Current Diagnosis/Assessment:  Goals Addressed            This Visit's Progress   . Pharmacy Care Plan       CARE PLAN ENTRY (see longitudinal plan of care for additional care plan information)  Current Barriers:  . Chronic Disease Management support, education, and care coordination needs related to Hypertension, Hyperlipidemia, Diabetes, and COPD   Hypertension BP Readings from Last 3 Encounters:  10/17/20 (!) 165/80  10/11/20 136/72  07/18/20 140/70   . Pharmacist Clinical Goal(s): o Over the next 90 days, patient will work with PharmD  and providers to achieve BP goal <130/80 . Current regimen:  o Metoprolol 50 mg three times daily o Torsemide 40 mg twice daily o Metolazone 5 mg once weekly as needed for swelling . Interventions: o Discussed benefits of exercise. Increase activity as tolerated to goal of 150 minutes each week beginning with 5-10 minutes each day.  . Patient self care activities - Over the next 90 days, patient will: o Check BP weekly, document, and provide at future appointments o Ensure daily salt intake < 2300 mg/day  Hyperlipidemia Lab Results  Component Value Date/Time   LDLCALC 65 12/10/2020 01:42 PM   . Pharmacist Clinical Goal(s): o Over the next 90 days, patient will work with PharmD and providers to maintain LDL goal < 70 . Current regimen:  o Rosuvastatin 20 mg daily o Omega-3 Fish Oil 2000 mg twice daily  . Interventions: o Recommend patient incorporate daily exercise with goal of 150 minutes. o Continue work on [employer] of lean meats, vegetables and fruit and whole grains in moderation.   o Continue taking medication as prescribed.  o Consider  increasing rosuvastatin if lipid panel is not at goal. . Patient self care activities - Over the next 90 days, patient will: o Take medication as prescribed.  o Contact pharmacist or provider with any questions.   Diabetes Lab Results  Component Value Date/Time   HGBA1C 9.4 (H) 12/10/2020 01:42 PM   HGBA1C 12.0 (H) 05/31/2020 10:48 AM   . Pharmacist Clinical Goal(s): o Over the next 90 days, patient will work with PharmD and providers to achieve A1c goal <7% . Current regimen:  o Ozempic 1 mg weekly  o Tresiba 50 units daily o Farxiga 5 mg daily  . Interventions: o Reviewed home blood sugar readings.  o Discussed current Diabetes regimen.  o Coordinating patient assistance for 2022.  o Discussed appropriate ways to correct low blood sugar.  o Coordinating [place] through [place] and [place] whould be delivered to office in 10-14 days from (12/26/2020). Patient dropped of application and forms submitted 12/25/2020.  [name] Patient self care activities - Over the next 90 days, patient will: o Check blood sugar once daily, document, and provide at future appointments o Contact provider with any episodes of hypoglycemia  COPD . Pharmacist Clinical Goal(s) o Over the next 90 days, patient will work with PharmD and providers to improve adherence with maintenance regimen.  . Current regimen:  o Breztri 2 puffs twice daily o Montelukast 10 mg every evening o Ventolin 2 puffs every 4 hours prn shortness of breath or wheezing . Interventions: o Breztri approved through patient assistance. Updated attestation for 2022 renewal.  o Recommend using maintenance inhaler every day to prevent use of rescue inhaler or exacerbations.  . Patient self care activities - Over the next 90 days, patient will: o Use maintenance inhaler daily.   Medication management . Pharmacist Clinical Goal(s): o Over the next 90 days, patient will work with PharmD and providers to achieve optimal  medication adherence . Current pharmacy: [facility] . Interventions o Comprehensive medication review performed. o Continue current medication management strategy . Patient self care activities - Over the next 90 days, patient will: o Focus on medication adherence by continuing to use pill box.  o Take medications as prescribed o Report any questions or concerns to PharmD and/or provider(s)  Please see past updates related to this goal by clicking on the "Past Updates" button in the selected goal  AFIB   Patient is currently rhythm controlled. HR 77 BPM  Patient has failed these meds in past: n/a Patient is currently controlled on the following medications:   Amiodarone 200 mg daily  Eliquis 5 mg bid  We discussed:  Patient is receiving Eliquis through patient assistance. Pharmacist will coordinate renewal of medication for 2022 once 3% out of pocket requirement met.   Plan  Continue current medications  ,  COPD / Asthma / Tobacco   Eosinophil count:  No results found for: EOSPCT%                               Eos (Absolute):  Lab Results  Component Value Date/Time   EOSABS 0.3 12/10/2020 01:42 PM    Tobacco Status:  Social History   Tobacco Use  Smoking Status Former Smoker  . Quit date: 71  . Years since quitting: 29.0  Smokeless Tobacco Never Used    Patient has failed these meds in past: Breo ellipta Patient is currently controlled on the following medications:   Breztri 2 puffs twice daily  Duoneb every 6 hours prn shortness of breath  Levoceterizine 5 mg daily prn allergies  Montelukast 10 mg daily every evening  Ventolin 2 puffs every 4 hours prn shortness of breath wheezing   Using maintenance inhaler regularly? Yes Frequency of rescue inhaler use:  1-2x per week  We discussed:  proper inhaler technique. [place] and [place] approved for 2022. Patient's medication shipped directly to his home.   Plan  Continue current medications.   Diabetes   Recent Relevant Labs: Lab Results  Component Value Date/Time   HGBA1C 9.4 (H) 12/10/2020 01:42 PM   HGBA1C 12.0 (H) 05/31/2020 10:48 AM   MICROALBUR 150 05/31/2020 11:09 AM     Kidney Function Lab Results  Component Value Date/Time   CREATININE 1.32 (H) 12/10/2020 01:42 PM   CREATININE 1.24 07/18/2020 02:32 PM   GFRNONAA 52 (L) 12/10/2020 01:42 PM   GFRAA 60 12/10/2020 01:42 PM   K 4.7 12/10/2020 01:42 PM   K 4.4 07/18/2020 02:32 PM    Checking BG: Daily  Recent FBG Readings: 160 After meal Blood Sugar: 245    Patient has failed these meds in past: Tresiba, Victoza, metformin Patient is currently uncontrolled on the following medications:   Tresiba 50 units   Ozempic 1 mg weekly  BD Pen needle 32gx75m use with TTyler Aas Last diabetic Foot exam: July 2021 Last diabetic Eye exam: Daughter uncertain of exact date but has been completed in the last year.   We discussed: diet and exercise extensively.   Most recent a1c improved but Dr. CTobie Poetagreed with adding Farxiga 5 mg daily. Pharmacist [facility] today [facility] [place] [facility] Patient is willing to try medication and patient's wife takes FIran5 mg daily currently. Patient's blood sugars remain above goal. Patient reports taking medication as prescribed. Patient is unable to exercise.   Plan   Begin Farxiga 5 mg daily once available. Recommend increasing at next visit if tolerating well. Consider increasing Tresiba dose if blood sugar remains above goal.    Hyperlipidemia   LDL goal < 70  Lipid Panel     Component Value Date/Time   CHOL 126 12/10/2020 1342   TRIG 148 12/10/2020 1342   HDL 35 (L) 12/10/2020 1342   LDLCALC 65 12/10/2020 1342    Hepatic Function Latest Ref Rng & Units 12/10/2020 07/18/2020  05/31/2020  Total Protein 6.0 - 8.5 g/dL 6.5 - 6.2  Albumin 3.7 - 4.7 g/dL 3.9 3.7 3.6(L)  AST 0 - 40 IU/L 35 - 33  ALT 0 - 44 IU/L 23 - 22  Alk Phosphatase 44 - 121 IU/L 173(H) - 140(H)   Total Bilirubin 0.0 - 1.2 mg/dL 0.7 - 0.2     The ASCVD Risk score Mikey Bussing DC Jr., et al., 2013) failed to calculate for the following reasons:   The valid total cholesterol range is 130 to 320 mg/dL   Patient has failed these meds in past: atorvastatin Patient is currently uncontrolled on the following medications:  . Rosuvastatin 20 mg daily . Omega-3 2000 mg bid  We discussed:  diet and exercise extensively.Patient's most recent lipid panel indicates improved LDL. Monitor future lipid panel results to determine need for increase in Crestor. Patient's diet is not consistent with recommendations of low-fat. He sneaks snacks per wife and daughter.   Plan  Continue current medications.     Vaccines   Reviewed and discussed patient's vaccination history.    Coordinated patient an appointment for Covid booster shot.   Immunization History  Administered Date(s) Administered  . Fluad Quad(high Dose 65+) 10/11/2020  . Influenza-Unspecified 08/16/2019  . Moderna SARS-COV2 Booster Vaccination 12/10/2020  . Moderna Sars-Covid-2 Vaccination 12/22/2019, 01/19/2020  . Pneumococcal Conjugate-13 07/26/2014  . Pneumococcal Polysaccharide-23 08/19/2012    Plan  Recommended patient receive Covid booster in ofifce.   Medication Management   Pt uses [facility] pharmacy for all medications Uses pill box? Yes Pt endorses fair compliance  We discussed: Patient order for [name] will be faxed to [facility] and [place] as soon as available to pharmacist. [facility] will be shipping Ozempic and Tresiba to office.   Plan  Continue current medication management strategy    Follow up: 2 month phone visit

## 2020-12-26 NOTE — Progress Notes (Signed)
Chronic Care Management Pharmacy Assistant   Name: Shawn Sullivan  MRN: [id number] DOB: 1946-05-18  Reason for Encounter: Medication Review for PAP for Ozempic and [name]    PCP : Rochel Brome, MD  Allergies:   Allergies  Allergen Reactions  . Androgel [Testosterone] Other (See Comments)    Pedal edema   . Biaxin [Clarithromycin] Other (See Comments)    Taste perception alteration  . Duloxetine Other (See Comments)    Hallucinations  . Gabapentin Other (See Comments)    hallucinations  . Ibuprofen Other (See Comments)    GI upset   . Lyrica [Pregabalin] Swelling  . Spironolactone Other (See Comments)    Medications: Outpatient Encounter Medications as of 12/26/2020  Medication Sig  . acetaminophen (TYLENOL) 500 MG tablet Take 1,000 mg by mouth every 6 (six) hours as needed for moderate pain or headache.  [name] amiodarone (PACERONE) 200 MG tablet Take 1 tablet (200 mg total) by mouth every evening.  [name] amoxicillin-clavulanate (AUGMENTIN) 875-125 MG tablet Take 1 tablet by mouth 2 (two) times daily.  [name] apixaban (ELIQUIS) 5 MG TABS tablet Take 5 mg by mouth 2 (two) times daily.   [name] b complex vitamins tablet Take 1 tablet by mouth daily.  . BD PEN NEEDLE MICRO U/F 32G X 6 MM MISC SMARTSIG:1 Needle SUB-Q Daily  . Budeson-Glycopyrrol-Formoterol (BREZTRI AEROSPHERE) 160-9-4.8 MCG/ACT AERO Inhale 2 puffs into the lungs 2 (two) times daily.  . Catheters (FREEDOM CATH MALE EXT CATHETER) MISC 1 each by Does not apply route daily.  . cholecalciferol (VITAMIN D3) 25 MCG (1000 UT) tablet Take 1,000 Units by mouth daily.   [name] CINNAMON PO Take 700 mg by mouth daily.   . Continuous Blood Gluc Receiver (FREESTYLE LIBRE 2 READER) DEVI 1 each by Does not apply route 4 (four) times daily - after meals and at bedtime.  . Continuous Blood Gluc Sensor (FREESTYLE LIBRE 2 SENSOR) MISC 2 each by Does not apply route every 14 (fourteen) days.  . Cyanocobalamin (B-12) 5000 MCG CAPS Take 5,000 mcg  by mouth daily.   . folic acid (FOLVITE) 425 MCG tablet Take 800 mcg by mouth daily.  [name] HYDROcodone-acetaminophen (NORCO) 10-325 MG tablet Take 1 tablet by mouth every 4 (four) hours as needed for moderate pain.   [name] insulin degludec (TRESIBA FLEXTOUCH) 100 UNIT/ML FlexTouch Pen Inject 50 Units into the skin daily.  [name] ipratropium-albuterol (DUONEB) 0.5-2.5 (3) MG/3ML SOLN Take 3 mLs by nebulization every 6 (six) hours as needed (shortness of breath).   [name] levocetirizine (XYZAL) 5 MG tablet Take 5 mg by mouth daily as needed for allergies.  . magnesium chloride (SLOW-MAG) 64 MG TBEC SR tablet Take 1 tablet (64 mg total) by mouth 2 (two) times daily. (Patient taking differently: Take 1 tablet by mouth daily. )  . metolazone (ZAROXOLYN) 5 MG tablet Take 5 mg by mouth once a week. Uses prn if swollen once weekly.  . metoprolol tartrate (LOPRESSOR) 50 MG tablet Take 1 tablet (50 mg total) by mouth in the morning, at noon, and at bedtime.  . montelukast (SINGULAIR) 10 MG tablet Take 1 tablet (10 mg total) by mouth every evening.  . Omeprazole 20 MG TBEC Take 20 mg by mouth 2 (two) times daily before a meal.   . potassium chloride SA (K-DUR) 20 MEQ tablet Take 20 mEq by mouth 2 (two) times daily.  [name] pyridOXINE (VITAMIN B-6) 100 MG tablet Take 100 mg by mouth daily.   [name] rOPINIRole (  REQUIP) 0.5 MG tablet TAKE 1 TABLET BY MOUTH AT BEDTIME  . rosuvastatin (CRESTOR) 20 MG tablet Take 1 tablet by mouth once daily  . Semaglutide, 1 MG/DOSE, (OZEMPIC, 1 MG/DOSE,) 2 MG/1.5ML SOPN Inject 1 mg into the skin once a week.  . sertraline (ZOLOFT) 50 MG tablet Take 1 tablet by mouth twice daily  . tamsulosin (FLOMAX) 0.4 MG CAPS capsule TAKE 1 CAPSULE BY MOUTH BEFORE BEDTIME  . torsemide (DEMADEX) 20 MG tablet Take 1 tablet (20 mg total) by mouth 2 (two) times daily. Take an extra tablet (20 mg) in the afternoon on Monday, Wednesday, Friday. (Patient taking differently: Take 40 mg by mouth 2 (two) times daily. 2 in the am  and 2 at noon)  . VENTOLIN HFA 108 (90 Base) MCG/ACT inhaler Inhale 2 puffs into the lungs every 4 (four) hours as needed for wheezing or shortness of breath.   No facility-administered encounter medications on file as of 12/26/2020.    Current Diagnosis: Patient Active Problem List   Diagnosis Date Noted  . Acute sinusitis 10/17/2020  . Orchitis 07/18/2020  . Testicular pain, right 07/15/2020  . Atrial fibrillation, chronic ([place]) 05/31/2020  . Anxiety 05/31/2020  . BMI 45.0-49.9, adult ([place]) 04/11/2020  . Balanitis 04/11/2020  . Diabetes mellitus due to underlying condition, controlled, with diabetic polyneuropathy, with long-term current use of insulin ([place]) 02/23/2020  . Gastroesophageal reflux disease without esophagitis 02/23/2020  . Chronic anticoagulation 06/14/2019  . Hypertensive heart and kidney disease with chronic diastolic congestive heart failure and stage 3 chronic kidney disease ([place]) 06/14/2019  . Persistent atrial fibrillation ([place]) 06/14/2019  . Medication management 04/19/2019  . On amiodarone therapy 03/24/2019  . Polyneuropathy associated with underlying disease ([place]) 10/11/2018  . Hypokalemia 10/04/2018  . PAF (paroxysmal atrial fibrillation) ([place]) 07/01/2018  . Anticoagulated 07/01/2018  . CKD (chronic kidney disease) stage 3, GFR 30-59 ml/min (H[facility]) 07/01/2018  . COPD (chronic obstructive pulmonary disease) ([place]) 07/01/2018  . Obstructive sleep apnea 07/01/2018  . Hypertensive heart disease 07/01/2018  . Edema of both lower extremities 06/20/2018  . Shortness of breath 06/20/2018  . Obesity, morbid, BMI 40.0-49.9 ([place]) 10/04/2017  . Chronic pain syndrome 08/30/2017  . Restrictive lung disease 06/22/2016  . Generalized edema 06/22/2016  . Restless legs syndrome 03/23/2016  . Presbyopia of both eyes 12/16/2015  . Hyperlipidemia 12/16/2015  . Bilateral pseudophakia 12/16/2015  . Dermatochalasis 12/16/2015  . Chronic obstructive pulmonary disease ([place])  11/05/2015  . Type 2 diabetes mellitus without complications ([place]) 12/45/8099  . Edema 11/04/2015  . Piriformis syndrome 05/03/2014  . Disorder of bursae and tendons in shoulder region 01/05/2014  . Low back pain 08/17/2013  . Sleep apnea 05/06/2013  . Postlaminectomy syndrome, lumbar region 05/06/2013  . Lumbosacral spondylosis 05/06/2013  . Depressive disorder 05/06/2013  . Chronic pain 05/06/2013  . Benign essential hypertension 05/06/2013   Called [employer] to check on application status for Ozempic and [name], application was processed over the phone, patient was approved thru Dec 2022, for both medications.  They will both ship out today (12/26/20) and should arrive at Dr. Tobie Poet office in 10-14 days.    I called and left the patient a message that he was approved and the office would call hom when they arrive.    Follow-Up:  Patient Assistance Coordination and Pharmacist Review  Donette Larry, CPP  Notified  Clarita Leber, [place] Pharmacist Assistant [phone]

## 2020-12-27 ENCOUNTER — Telehealth: Payer: Self-pay

## 2020-12-27 ENCOUNTER — Ambulatory Visit: Payer: Medicare HMO

## 2020-12-27 ENCOUNTER — Other Ambulatory Visit: Payer: Self-pay

## 2020-12-27 DIAGNOSIS — I130 Hypertensive heart and chronic kidney disease with heart failure and stage 1 through stage 4 chronic kidney disease, or unspecified chronic kidney disease: Secondary | ICD-10-CM

## 2020-12-27 DIAGNOSIS — E782 Mixed hyperlipidemia: Secondary | ICD-10-CM

## 2020-12-27 DIAGNOSIS — E1121 Type 2 diabetes mellitus with diabetic nephropathy: Secondary | ICD-10-CM

## 2020-12-27 DIAGNOSIS — N1831 Chronic kidney disease, stage 3a: Secondary | ICD-10-CM

## 2020-12-27 NOTE — Telephone Encounter (Cosign Needed)
Patient is willing to begin Farxiga 5 mg daily per Dr. Alyse Low recommendation.   Please print prescription and sign. Patient is approved for [place] Patient Assistance. Pharmacist will fax order to [place] and [place] for medication to be shipped at patient's home.   Daughter indicates that diet and lack of exercise are causing blood sugar to remain above goal with [place] and Ozempic.   Sherre Poot, PharmD, [facility] Clinical Pharmacist [facility] [phone] (office) [phone] (mobile)

## 2020-12-27 NOTE — Patient Instructions (Addendum)
Visit Information  Goals Addressed            This Visit's Progress   . Pharmacy Care Plan       CARE PLAN ENTRY (see longitudinal plan of care for additional care plan information)  Current Barriers:  . Chronic Disease Management support, education, and care coordination needs related to Hypertension, Hyperlipidemia, Diabetes, and COPD   Hypertension BP Readings from Last 3 Encounters:  10/17/20 (!) 165/80  10/11/20 136/72  07/18/20 140/70   . Pharmacist Clinical Goal(s): o Over the next 90 days, patient will work with PharmD and providers to achieve BP goal <130/80 . Current regimen:  o Metoprolol 50 mg three times daily o Torsemide 40 mg twice daily o Metolazone 5 mg once weekly as needed for swelling . Interventions: o Discussed benefits of exercise. Increase activity as tolerated to goal of 150 minutes each week beginning with 5-10 minutes each day.  . Patient self care activities - Over the next 90 days, patient will: o Check BP weekly, document, and provide at future appointments o Ensure daily salt intake < 2300 mg/day  Hyperlipidemia Lab Results  Component Value Date/Time   LDLCALC 65 12/10/2020 01:42 PM   . Pharmacist Clinical Goal(s): o Over the next 90 days, patient will work with PharmD and providers to maintain LDL goal < 70 . Current regimen:  o Rosuvastatin 20 mg daily o Omega-3 Fish Oil 2000 mg twice daily  . Interventions: o Recommend patient incorporate daily exercise with goal of 150 minutes. o Continue work on [employer] of lean meats, vegetables and fruit and whole grains in moderation.   o Continue taking medication as prescribed.  o Consider increasing rosuvastatin if lipid panel is not at goal. . Patient self care activities - Over the next 90 days, patient will: o Take medication as prescribed.  o Contact pharmacist or provider with any questions.   Diabetes Lab Results  Component Value Date/Time   HGBA1C 9.4 (H) 12/10/2020 01:42 PM    HGBA1C 12.0 (H) 05/31/2020 10:48 AM   . Pharmacist Clinical Goal(s): o Over the next 90 days, patient will work with PharmD and providers to achieve A1c goal <7% . Current regimen:  o Ozempic 1 mg weekly  o Tresiba 50 units daily o Farxiga 5 mg daily  . Interventions: o Reviewed home blood sugar readings.  o Discussed current Diabetes regimen.  o Coordinating patient assistance for 2022.  o Discussed appropriate ways to correct low blood sugar.  o Coordinating [place] through [place] and [place] whould be delivered to office in 10-14 days from (12/26/2020). Patient dropped of application and forms submitted 12/25/2020.  [name] Patient self care activities - Over the next 90 days, patient will: o Check blood sugar once daily, document, and provide at future appointments o Contact provider with any episodes of hypoglycemia  COPD . Pharmacist Clinical Goal(s) o Over the next 90 days, patient will work with PharmD and providers to improve adherence with maintenance regimen.  . Current regimen:  o Breztri 2 puffs twice daily o Montelukast 10 mg every evening o Ventolin 2 puffs every 4 hours prn shortness of breath or wheezing . Interventions: o Breztri approved through patient assistance. Updated attestation for 2022 renewal.  o Recommend using maintenance inhaler every day to prevent use of rescue inhaler or exacerbations.  . Patient self care activities - Over the next 90 days, patient will: o Use maintenance inhaler daily.   Medication management .  Pharmacist Clinical Goal(s): o Over the next 90 days, patient will work with PharmD and providers to achieve optimal medication adherence . Current pharmacy: [facility] . Interventions o Comprehensive medication review performed. o Continue current medication management strategy . Patient self care activities - Over the next 90 days, patient will: o Focus on medication adherence by continuing to use pill box.   o Take medications as prescribed o Report any questions or concerns to PharmD and/or provider(s)  Please see past updates related to this goal by clicking on the "Past Updates" button in the selected goal         The patient verbalized understanding of instructions, educational materials, and care plan provided today and declined offer to receive copy of patient instructions, educational materials, and care plan.   Telephone follow up appointment with pharmacy team member scheduled for: 01/2021  Burnice Logan, [facility]  Diabetes Mellitus and Nutrition, Adult When you have diabetes, or diabetes mellitus, it is very important to have healthy eating habits because your blood sugar (glucose) levels are greatly affected by what you eat and drink. Eating healthy foods in the right amounts, at about the same times every day, can help you:  Control your blood glucose.  Lower your risk of heart disease.  Improve your blood pressure.  Reach or maintain a healthy weight. What can affect my meal plan? Every person with diabetes is different, and each person has different needs for a meal plan. Your health care provider may recommend that you work with a dietitian to make a meal plan that is best for you. Your meal plan may vary depending on factors such as:  The calories you need.  The medicines you take.  Your weight.  Your blood glucose, blood pressure, and cholesterol levels.  Your activity level.  Other health conditions you have, such as heart or kidney disease. How do carbohydrates affect me? Carbohydrates, also called carbs, affect your blood glucose level more than any other type of food. Eating carbs naturally raises the amount of glucose in your blood. Carb counting is a method for keeping track of how many carbs you eat. Counting carbs is important to keep your blood glucose at a healthy level, especially if you use insulin or take certain oral diabetes medicines. It is important  to know how many carbs you can safely have in each meal. This is different for every person. Your dietitian can help you calculate how many carbs you should have at each meal and for each snack. How does alcohol affect me? Alcohol can cause a sudden decrease in blood glucose (hypoglycemia), especially if you use insulin or take certain oral diabetes medicines. Hypoglycemia can be a life-threatening condition. Symptoms of hypoglycemia, such as sleepiness, dizziness, and confusion, are similar to symptoms of having too much alcohol.  Do not drink alcohol if: ? Your health care provider tells you not to drink. ? You are pregnant, may be pregnant, or are planning to become pregnant.  If you drink alcohol: ? Do not drink on an empty stomach. ? Limit how much you use to:  0-1 drink a day for women.  0-2 drinks a day for men. ? Be aware of how much alcohol is in your drink. In the [place] one drink equals one 12 oz bottle of beer (355 mL), one 5 oz glass of wine (148 mL), or one 1 oz glass of hard liquor (44 mL). ? Keep yourself hydrated with water, diet soda, or unsweetened  iced tea.  Keep in mind that regular soda, juice, and other mixers may contain a lot of sugar and must be counted as carbs. What are tips for following this plan? Reading food labels  Start by checking the serving size on the "Nutrition Facts" label of packaged foods and drinks. The amount of calories, carbs, fats, and other nutrients listed on the label is based on one serving of the item. Many items contain more than one serving per package.  Check the total grams (g) of carbs in one serving. You can calculate the number of servings of carbs in one serving by dividing the total carbs by 15. For example, if a food has 30 g of total carbs per serving, it would be equal to 2 servings of carbs.  Check the number of grams (g) of saturated fats and trans fats in one serving. Choose foods that have a low amount or none of these  fats.  Check the number of milligrams (mg) of salt (sodium) in one serving. Most people should limit total sodium intake to less than 2,300 mg per day.  Always check the nutrition information of foods labeled as "low-fat" or "nonfat." These foods may be higher in added sugar or refined carbs and should be avoided.  Talk to your dietitian to identify your daily goals for nutrients listed on the label. Shopping  Avoid buying canned, pre-made, or processed foods. These foods tend to be high in fat, sodium, and added sugar.  Shop around the outside edge of the grocery store. This is where you will most often find fresh fruits and vegetables, bulk grains, fresh meats, and fresh dairy. Cooking  Use low-heat cooking methods, such as baking, instead of high-heat cooking methods like deep frying.  Cook using healthy oils, such as olive, canola, or sunflower oil.  Avoid cooking with butter, cream, or high-fat meats. Meal planning  Eat meals and snacks regularly, preferably at the same times every day. Avoid going long periods of time without eating.  Eat foods that are high in fiber, such as fresh fruits, vegetables, beans, and whole grains. Talk with your dietitian about how many servings of carbs you can eat at each meal.  Eat 4-6 oz (112-168 g) of lean protein each day, such as lean meat, chicken, fish, eggs, or tofu. One ounce (oz) of lean protein is equal to: ? 1 oz (28 g) of meat, chicken, or fish. ? 1 egg. ?  cup (62 g) of tofu.  Eat some foods each day that contain healthy fats, such as avocado, nuts, seeds, and fish.   What foods should I eat? Fruits Berries. Apples. Oranges. Peaches. Apricots. Plums. Grapes. Mango. Papaya. Pomegranate. Kiwi. Cherries. Vegetables Lettuce. Spinach. Leafy greens, including kale, chard, collard greens, and mustard greens. Beets. Cauliflower. Cabbage. Broccoli. Carrots. Green beans. Tomatoes. Peppers. Onions. Cucumbers. Brussels sprouts. Grains Whole  grains, such as whole-wheat or whole-grain bread, crackers, tortillas, cereal, and pasta. Unsweetened oatmeal. Quinoa. Zoe Creasman or wild rice. Meats and other proteins Seafood. Poultry without skin. Lean cuts of poultry and beef. Tofu. Nuts. Seeds. Dairy Low-fat or fat-free dairy products such as milk, yogurt, and cheese. The items listed above may not be a complete list of foods and beverages you can eat. Contact a dietitian for more information. What foods should I avoid? Fruits Fruits canned with syrup. Vegetables Canned vegetables. Frozen vegetables with butter or cream sauce. Grains Refined white flour and flour products such as bread, pasta, snack foods, and cereals. Avoid  all processed foods. Meats and other proteins Fatty cuts of meat. Poultry with skin. Breaded or fried meats. Processed meat. Avoid saturated fats. Dairy Full-fat yogurt, cheese, or milk. Beverages Sweetened drinks, such as soda or iced tea. The items listed above may not be a complete list of foods and beverages you should avoid. Contact a dietitian for more information. Questions to ask a health care provider  Do I need to meet with a diabetes educator?  Do I need to meet with a dietitian?  What number can I call if I have questions?  When are the best times to check my blood glucose? Where to find more information:  American Diabetes Association: diabetes.org  Academy of Nutrition and Dietetics: www.eatright.[employer] of Diabetes and Digestive and Kidney Diseases: [employer]  Association of Diabetes Care and Education Specialists: www.diabeteseducator.org Summary  It is important to have healthy eating habits because your blood sugar (glucose) levels are greatly affected by what you eat and drink.  A healthy meal plan will help you control your blood glucose and maintain a healthy lifestyle.  Your health care provider may recommend that you work with a dietitian to make a meal  plan that is best for you.  Keep in mind that carbohydrates (carbs) and alcohol have immediate effects on your blood glucose levels. It is important to count carbs and to use alcohol carefully. This information is not intended to replace advice given to you by your health care provider. Make sure you discuss any questions you have with your health care provider. Document Revised: 10/24/2019 Document Reviewed: 10/24/2019 Elsevier Patient Education  2021 [employer].

## 2020-12-31 ENCOUNTER — Ambulatory Visit (INDEPENDENT_AMBULATORY_CARE_PROVIDER_SITE_OTHER): Payer: Medicare HMO | Admitting: Cardiology

## 2020-12-31 ENCOUNTER — Encounter: Payer: Self-pay | Admitting: Cardiology

## 2020-12-31 ENCOUNTER — Other Ambulatory Visit: Payer: Self-pay

## 2020-12-31 VITALS — BP 124/60 | HR 80 | Ht 72.0 in | Wt 325.0 lb

## 2020-12-31 DIAGNOSIS — I350 Nonrheumatic aortic (valve) stenosis: Secondary | ICD-10-CM | POA: Diagnosis not present

## 2020-12-31 DIAGNOSIS — Z7901 Long term (current) use of anticoagulants: Secondary | ICD-10-CM

## 2020-12-31 DIAGNOSIS — I4510 Unspecified right bundle-branch block: Secondary | ICD-10-CM | POA: Diagnosis not present

## 2020-12-31 DIAGNOSIS — I5032 Chronic diastolic (congestive) heart failure: Secondary | ICD-10-CM | POA: Diagnosis not present

## 2020-12-31 DIAGNOSIS — N183 Chronic kidney disease, stage 3 unspecified: Secondary | ICD-10-CM | POA: Diagnosis not present

## 2020-12-31 DIAGNOSIS — I130 Hypertensive heart and chronic kidney disease with heart failure and stage 1 through stage 4 chronic kidney disease, or unspecified chronic kidney disease: Secondary | ICD-10-CM | POA: Diagnosis not present

## 2020-12-31 DIAGNOSIS — I480 Paroxysmal atrial fibrillation: Secondary | ICD-10-CM | POA: Diagnosis not present

## 2020-12-31 DIAGNOSIS — Z79899 Other long term (current) drug therapy: Secondary | ICD-10-CM | POA: Diagnosis not present

## 2020-12-31 NOTE — Patient Instructions (Signed)

## 2020-12-31 NOTE — Progress Notes (Signed)
Cardiology Office Note:    Date:  12/31/2020   ID:  [name], DOB [birth year], MRN Shawn Sullivan:[id number]  PCP:  Rochel Brome, MD  Cardiologist:  Shirlee More, MD    Referring MD: Rochel Brome, MD    ASSESSMENT:    1. PAF (paroxysmal atrial fibrillation) ([place])   2. On amiodarone therapy   3. Chronic anticoagulation   4. Right bundle branch block   5. Hypertensive heart and kidney disease with chronic diastolic congestive heart failure and stage 3 chronic kidney disease, unspecified whether stage 3a or 3b CKD ([facility])   6. Nonrheumatic aortic valve stenosis    PLAN:    In order of problems listed above:  1. He has done well maintaining sinus rhythm on low-dose amiodarone and tolerating his anticoagulant without bleeding complication continue same.  No evidence of longer thyroid toxicity or liver but will do a chest x-ray to screen him with his history of chronic lung disease 2. Stable EKG pattern right bundle branch block left anterior hemiblock 3. Stable BP at target heart failure compensated and stable chronic kidney disease continue his current diuretic 4. Mild aortic stenosis asymptomatic will need a repeat echocardiogram 1 to 2 years   Next appointment: 6 months   Medication Adjustments/Labs and Tests Ordered: Current medicines are reviewed at length with the patient today.  Concerns regarding medicines are outlined above.  No orders of the defined types were placed in this encounter.  No orders of the defined types were placed in this encounter.   Chief Complaint  Patient presents with  . Follow-up  . Atrial Fibrillation    History of Present Illness:    Shawn Sullivan is a 75 y.o. male with a Shawn [facility] of paroxysmal atrial fibrillation hypertensive heart disease with chronic kidney disease stage III COPD obstructive sleep apnea amiodarone therapy mild aortic stenosis and right bundle branch block last seen 06/28/2020.  Compliance with diet, lifestyle and medications:  Yes  His daughter is present participates in his evaluation and medical decision making. Recently took a dose of metolazone for edema and it cleared. His blood sugars are quite elevated and he is waiting to start an SGLT2 inhibitor when the prescription arrives. Otherwise no edema shortness of breath chest pain palpitation or syncope I will ask him to have a chest x-ray done screening for amiodarone lung toxicity.  Subsequently 09/11/2020 echocardiogram shows EF 60 to 65% moderate concentric LVH elevated left ventricular filling pressure the aortic valve is calcified with mild aortic stenosis.  Peak and mean gradients 30/15 mmHg.  Recent creatinine stable 1.32 GFR 52 cc hemoglobin 13.2 platelets 270,000 hemoglobin A1c was quite high 9.4% and his lipid panel was at target with an LDL of 65.  [facility] to [facility] ED 07/18/2020 for scrotal pain and swelling.  Laboratory studies showed a hemoglobin of 13.6 platelets 264,000 creatinine 1.20 potassium 4.1.  He was diagnosed as epididymitis and treated with oral antibiotic Past Medical History:  Diagnosis Date  . Anticoagulated 07/01/2018  . Anticoagulated 07/01/2018  . Benign essential hypertension 05/06/2013  . Chronic obstructive pulmonary disease ([place]) 11/05/2015  . COPD (chronic obstructive pulmonary disease) ([place]) 07/01/2018  . Dermatochalasis 12/16/2015  . Disorder of bursae and tendons in shoulder region 01/05/2014  . Generalized edema 06/22/2016  . Hyperlipidemia 12/16/2015  . Hypertensive heart disease 07/01/2018  . Low back pain 08/17/2013  . Lumbosacral spondylosis 05/06/2013  . Obstructive sleep apnea 07/01/2018  . Persistent atrial fibrillation ([facility]) 07/01/2018  . Piriformis syndrome  05/03/2014  . Postlaminectomy syndrome, lumbar region 05/06/2013  . Presbyopia of both eyes 12/16/2015  . Restless legs syndrome 03/23/2016  . Restrictive lung disease 06/22/2016  . Sleep apnea 05/06/2013  . Type 2 diabetes mellitus without complications ([facility]) 32/08/5187     Past Surgical History:  Procedure Laterality Date  . APPENDECTOMY    . BACK SURGERY    . CARDIOVERSION N/A 12/01/2018   Procedure: CARDIOVERSION;  Surgeon: Sanda Klein, MD;  Location: [facility];  Service: Cardiovascular;  Laterality: N/A;  . CATARACT EXTRACTION Bilateral   . LUMBAR FUSION    . NECK SURGERY      Current Medications: Current Meds  Medication Sig  . acetaminophen (TYLENOL) 500 MG tablet Take 1,000 mg by mouth every 6 (six) hours as needed for moderate pain or headache.  [name] amiodarone (PACERONE) 200 MG tablet Take 1 tablet (200 mg total) by mouth every evening.  [name] apixaban (ELIQUIS) 5 MG TABS tablet Take 5 mg by mouth 2 (two) times daily.   [name] b complex vitamins tablet Take 1 tablet by mouth daily.  . BD PEN NEEDLE MICRO U/F 32G X 6 MM MISC SMARTSIG:1 Needle SUB-Q Daily  . Budeson-Glycopyrrol-Formoterol (BREZTRI AEROSPHERE) 160-9-4.8 MCG/ACT AERO Inhale 2 puffs into the lungs 2 (two) times daily.  . Catheters (FREEDOM CATH MALE EXT CATHETER) MISC 1 each by Does not apply route daily.  . cholecalciferol (VITAMIN D3) 25 MCG (1000 UT) tablet Take 1,000 Units by mouth daily.   [name] CINNAMON PO Take 700 mg by mouth daily.   . folic acid (FOLVITE) 416 MCG tablet Take 800 mcg by mouth daily.  [name] HYDROcodone-acetaminophen (NORCO) 10-325 MG tablet Take 1 tablet by mouth every 4 (four) hours as needed for moderate pain.  [name] insulin degludec (TRESIBA FLEXTOUCH) 100 UNIT/ML FlexTouch Pen Inject 50 Units into the skin daily.  [name] ipratropium-albuterol (DUONEB) 0.5-2.5 (3) MG/3ML SOLN Take 3 mLs by nebulization every 6 (six) hours as needed (shortness of breath).   [name] levocetirizine (XYZAL) 5 MG tablet Take 5 mg by mouth daily as needed for allergies.  . magnesium chloride (SLOW-MAG) 64 MG TBEC SR tablet Take 1 tablet (64 mg total) by mouth 2 (two) times daily. (Patient taking differently: Take 1 tablet by mouth daily.)  . metolazone (ZAROXOLYN) 5 MG tablet Take 5 mg by mouth once a  week. Uses prn if swollen once weekly.  . metoprolol tartrate (LOPRESSOR) 50 MG tablet Take 1 tablet (50 mg total) by mouth in the morning, at noon, and at bedtime.  . montelukast (SINGULAIR) 10 MG tablet Take 1 tablet (10 mg total) by mouth every evening.  . Omeprazole 20 MG TBEC Take 20 mg by mouth 2 (two) times daily before a meal.   . potassium chloride SA (K-DUR) 20 MEQ tablet Take 20 mEq by mouth 2 (two) times daily.  [name] pyridOXINE (VITAMIN B-6) 100 MG tablet Take 100 mg by mouth daily.   [name] rOPINIRole (REQUIP) 0.5 MG tablet TAKE 1 TABLET BY MOUTH AT BEDTIME  . rosuvastatin (CRESTOR) 20 MG tablet Take 1 tablet by mouth once daily  . Semaglutide, 1 MG/DOSE, (OZEMPIC, 1 MG/DOSE,) 2 MG/1.5ML SOPN Inject 1 mg into the skin once a week.  . sertraline (ZOLOFT) 50 MG tablet Take 1 tablet by mouth twice daily  . tamsulosin (FLOMAX) 0.4 MG CAPS capsule TAKE 1 CAPSULE BY MOUTH BEFORE BEDTIME  . torsemide (DEMADEX) 20 MG tablet Take 1 tablet (20 mg total) by mouth 2 (two) times daily. Take  an extra tablet (20 mg) in the afternoon on Monday, Wednesday, Friday. (Patient taking differently: Take 40 mg by mouth 2 (two) times daily. 2 in the am and 2 at noon)  . VENTOLIN HFA 108 (90 Base) MCG/ACT inhaler Inhale 2 puffs into the lungs every 4 (four) hours as needed for wheezing or shortness of breath.     Allergies:   Androgel [testosterone], Biaxin [clarithromycin], Duloxetine, Gabapentin, Ibuprofen, Lyrica [pregabalin], and Spironolactone   Social History   Socioeconomic History  . Marital status: Married    Spouse name: Not on file  . Number of children: Not on file  . Years of education: Not on file  . Highest education level: Not on file  Occupational History  . Not on file  Tobacco Use  . Smoking status: Former Smoker    Quit date: 1993    Years since quitting: 29.1  . Smokeless tobacco: Never Used  Vaping Use  . Vaping Use: Never used  Substance and Sexual Activity  . Alcohol use: Not  Currently  . Drug use: Not Currently    Types: Hydrocodone  . Sexual activity: Not on file  Other Topics Concern  . Not on file  Social History Narrative  . Not on file   Social Determinants of Health   Financial Resource Strain: Not on file  Food Insecurity: No Food Insecurity  . Worried About Charity fundraiser in the Last Year: Never true  . Ran Out of Food in the Last Year: Never true  Transportation Needs: Not on file  Physical Activity: Inactive  . Days of Exercise per Week: 0 days  . Minutes of Exercise per Session: 0 min  Stress: Not on file  Social Connections: Not on file     Family History: The patient's family history includes Atrial fibrillation in his mother; Cancer in his mother; Heart disease in his father. ROS:   Please see the history of present illness.    All other systems reviewed and are negative.  EKGs/Labs/Other Studies Reviewed:    The following studies were reviewed today:  EKG:  EKG ordered today and personally reviewed.  The ekg ordered today demonstrates sinus rhythm right bundle branch block left anterior hemiblock normal PR interval  Recent Labs: 12/10/2020: ALT 23; BUN 17; Creatinine, Ser 1.32; Hemoglobin 13.2; Platelets 270; Potassium 4.7; Sodium 137  Recent Lipid Panel    Component Value Date/Time   CHOL 126 12/10/2020 1342   TRIG 148 12/10/2020 1342   HDL 35 (L) 12/10/2020 1342   CHOLHDL 3.6 12/10/2020 1342   LDLCALC 65 12/10/2020 1342    Physical Exam:    VS:  BP 124/60   Pulse 80   Ht 6' (1.829 m)   Wt (!) 325 lb (147.4 kg)   SpO2 (!) 88%   BMI 44.08 kg/m     Wt Readings from Last 3 Encounters:  12/31/20 (!) 325 lb (147.4 kg)  10/17/20 (!) 341 lb (154.7 kg)  10/11/20 (!) 341 lb (154.7 kg)     GEN:  Well nourished, well developed in no acute distress HEENT: Normal NECK: No JVD; No carotid bruits LYMPHATICS: No lymphadenopathy CARDIAC: 1/6 midsystolic ejection murmur aortic stenosis S2 is normal no aortic  regurgitation RRR, no rubs, gallops RESPIRATORY:  Clear to auscultation without rales, wheezing or rhonchi  ABDOMEN: Soft, non-tender, non-distended MUSCULOSKELETAL:  No edema; No deformity  SKIN: Warm and dry NEUROLOGIC:  Alert and oriented x 3 PSYCHIATRIC:  Normal affect    Signed,  Shirlee More, MD  12/31/2020 11:39 AM    [place]

## 2021-01-01 ENCOUNTER — Other Ambulatory Visit: Payer: Self-pay | Admitting: Family Medicine

## 2021-01-01 MED ORDER — DAPAGLIFLOZIN PROPANEDIOL 5 MG PO TABS: 5.0000 mg | ORAL_TABLET | Freq: Every day | ORAL | 3 refills | Status: DC

## 2021-01-02 DIAGNOSIS — M533 Sacrococcygeal disorders, not elsewhere classified: Secondary | ICD-10-CM | POA: Diagnosis not present

## 2021-01-14 ENCOUNTER — Other Ambulatory Visit: Payer: Self-pay

## 2021-01-14 ENCOUNTER — Ambulatory Visit (INDEPENDENT_AMBULATORY_CARE_PROVIDER_SITE_OTHER): Payer: Medicare HMO | Admitting: Family Medicine

## 2021-01-14 ENCOUNTER — Encounter: Payer: Self-pay | Admitting: Family Medicine

## 2021-01-14 VITALS — BP 128/84 | HR 74 | Temp 97.3°F | Ht 72.0 in | Wt 328.0 lb

## 2021-01-14 DIAGNOSIS — E1121 Type 2 diabetes mellitus with diabetic nephropathy: Secondary | ICD-10-CM | POA: Diagnosis not present

## 2021-01-14 DIAGNOSIS — I5032 Chronic diastolic (congestive) heart failure: Secondary | ICD-10-CM | POA: Diagnosis not present

## 2021-01-14 DIAGNOSIS — I482 Chronic atrial fibrillation, unspecified: Secondary | ICD-10-CM

## 2021-01-14 DIAGNOSIS — J449 Chronic obstructive pulmonary disease, unspecified: Secondary | ICD-10-CM | POA: Diagnosis not present

## 2021-01-14 DIAGNOSIS — F1120 Opioid dependence, uncomplicated: Secondary | ICD-10-CM | POA: Diagnosis not present

## 2021-01-14 DIAGNOSIS — I130 Hypertensive heart and chronic kidney disease with heart failure and stage 1 through stage 4 chronic kidney disease, or unspecified chronic kidney disease: Secondary | ICD-10-CM | POA: Diagnosis not present

## 2021-01-14 DIAGNOSIS — D6869 Other thrombophilia: Secondary | ICD-10-CM

## 2021-01-14 DIAGNOSIS — N1831 Chronic kidney disease, stage 3a: Secondary | ICD-10-CM | POA: Diagnosis not present

## 2021-01-14 DIAGNOSIS — G894 Chronic pain syndrome: Secondary | ICD-10-CM

## 2021-01-14 DIAGNOSIS — E782 Mixed hyperlipidemia: Secondary | ICD-10-CM

## 2021-01-14 LAB — POCT UA - MICROALBUMIN: Microalbumin Ur, POC: 80 mg/L

## 2021-01-14 NOTE — Progress Notes (Signed)
Established Patient Office Visit  Subjective:  Patient ID: Shawn Sullivan, male    DOB: [birth year]  Age: 75 y.o. MRN: [id number]  CC:  Chief Complaint  Patient presents with  . Hypertension  . Diabetes  . Hyperlipidemia    HPI Shawn Sullivan presents for follow up of chronic medical issues: Diabetic glomerulopathy (Shawn Sullivan)  Ozempic 1 mg qweek, tresiba 50 mg once daily. Recently started on Farxiga 5 mg daily. Sugars 120s recently. Taking it every other day. Checking feet daily. Due for eye exam. Eats poorly. No exercise due to chronic pain in knees and back. Last A1C 9.4.  Hypertensive heart and kidney disease with chronic diastolic congestive heart failure and stage 3a chronic kidney disease (Shawn Sullivan): On metolazone, metoprolol, and torsemide. Mixed hyperlipidemia: On crestor  Atrial fibrillation, chronic (HCC) on amiodarone 200 mg one at night and eliquis 5 mg one twice a day. Chronic obstructive pulmonary disease: On breztri 2 puffs twice a day.  Chronic pain syndrome: Pt is currenlty on norco. Sees pain clinic.  Past Medical History:  Diagnosis Date  . Anticoagulated 07/01/2018  . Anticoagulated 07/01/2018  . Benign essential hypertension 05/06/2013  . Chronic obstructive pulmonary disease ([place]) 11/05/2015  . COPD (chronic obstructive pulmonary disease) ([place]) 07/01/2018  . Dermatochalasis 12/16/2015  . Disorder of bursae and tendons in shoulder region 01/05/2014  . Generalized edema 06/22/2016  . Hyperlipidemia 12/16/2015  . Hypertensive heart disease 07/01/2018  . Low back pain 08/17/2013  . Lumbosacral spondylosis 05/06/2013  . Obstructive sleep apnea 07/01/2018  . Persistent atrial fibrillation (Shawn Sullivan) 07/01/2018  . Piriformis syndrome 05/03/2014  . Postlaminectomy syndrome, lumbar region 05/06/2013  . Presbyopia of both eyes 12/16/2015  . Restless legs syndrome 03/23/2016  . Restrictive lung disease 06/22/2016  . Sleep apnea 05/06/2013  . Type 2 diabetes mellitus without complications (Shawn Sullivan)  32/07/9241    Past Surgical History:  Procedure Laterality Date  . APPENDECTOMY    . BACK SURGERY    . CARDIOVERSION N/A 12/01/2018   Procedure: CARDIOVERSION;  Surgeon: Sanda Klein, MD;  Location: [facility];  Service: Cardiovascular;  Laterality: N/A;  . CATARACT EXTRACTION Bilateral   . LUMBAR FUSION    . NECK SURGERY      Family History  Problem Relation Age of Onset  . Atrial fibrillation Mother   . Cancer Mother   . Heart disease Father     Social History   Socioeconomic History  . Marital status: Married    Spouse name: Not on file  . Number of children: Not on file  . Years of education: Not on file  . Highest education level: Not on file  Occupational History  . Not on file  Tobacco Use  . Smoking status: Former Smoker    Quit date: 1993    Years since quitting: 29.1  . Smokeless tobacco: Never Used  Vaping Use  . Vaping Use: Never used  Substance and Sexual Activity  . Alcohol use: Not Currently  . Drug use: Not Currently    Types: Hydrocodone  . Sexual activity: Not on file  Other Topics Concern  . Not on file  Social History Narrative  . Not on file   Social Determinants of Health   Financial Resource Strain: Not on file  Food Insecurity: No Food Insecurity  . Worried About Charity fundraiser in the Last Year: Never true  . Ran Out of Food in the Last Year: Never true  Transportation Needs: Not on file  Physical Activity: Inactive  . Days of Exercise per Week: 0 days  . Minutes of Exercise per Session: 0 min  Stress: Not on file  Social Connections: Not on file  Intimate Partner Violence: Not on file    Outpatient Medications Prior to Visit  Medication Sig Dispense Refill  . acetaminophen (TYLENOL) 500 MG tablet Take 1,000 mg by mouth every 6 (six) hours as needed for moderate pain or headache.    [name] amiodarone (PACERONE) 200 MG tablet Take 1 tablet (200 mg total) by mouth every evening. 90 tablet 3  . apixaban (ELIQUIS) 5 MG TABS  tablet Take 5 mg by mouth 2 (two) times daily.     [name] b complex vitamins tablet Take 1 tablet by mouth daily.    . BD PEN NEEDLE MICRO U/F 32G X 6 MM MISC SMARTSIG:1 Needle SUB-Q Daily    . Budeson-Glycopyrrol-Formoterol (BREZTRI AEROSPHERE) 160-9-4.8 MCG/ACT AERO Inhale 2 puffs into the lungs 2 (two) times daily. 10.7 g 0  . Catheters (FREEDOM CATH MALE EXT CATHETER) MISC 1 each by Does not apply route daily. 30 each 3  . cholecalciferol (VITAMIN D3) 25 MCG (1000 UT) tablet Take 1,000 Units by mouth daily.     [name] CINNAMON PO Take 700 mg by mouth daily.     . dapagliflozin propanediol (FARXIGA) 5 MG TABS tablet Take 1 tablet (5 mg total) by mouth daily. Patient willing to begin. Pharmacist faxing order to [facility]. 90 tablet 3  . folic acid (FOLVITE) 509 MCG tablet Take 800 mcg by mouth daily.    [name] HYDROcodone-acetaminophen (NORCO) 10-325 MG tablet Take 1 tablet by mouth every 4 (four) hours as needed for moderate pain.    [name] insulin degludec (TRESIBA FLEXTOUCH) 100 UNIT/ML FlexTouch Pen Inject 50 Units into the skin daily.    [name] ipratropium-albuterol (DUONEB) 0.5-2.5 (3) MG/3ML SOLN Take 3 mLs by nebulization every 6 (six) hours as needed (shortness of breath).     [name] levocetirizine (XYZAL) 5 MG tablet Take 5 mg by mouth daily as needed for allergies.    . magnesium chloride (SLOW-MAG) 64 MG TBEC SR tablet Take 1 tablet (64 mg total) by mouth 2 (two) times daily. (Patient taking differently: Take 1 tablet by mouth daily.) 60 tablet 3  . metolazone (ZAROXOLYN) 5 MG tablet Take 5 mg by mouth once a week. Uses prn if swollen once weekly.    . metoprolol tartrate (LOPRESSOR) 50 MG tablet Take 1 tablet (50 mg total) by mouth in the morning, at noon, and at bedtime. 270 tablet 1  . montelukast (SINGULAIR) 10 MG tablet Take 1 tablet (10 mg total) by mouth every evening. 90 tablet 3  . Omeprazole 20 MG TBEC Take 20 mg by mouth 2 (two) times daily before a meal.     . potassium chloride SA (K-DUR) 20 MEQ  tablet Take 20 mEq by mouth 2 (two) times daily.    [name] pyridOXINE (VITAMIN B-6) 100 MG tablet Take 100 mg by mouth daily.     [name] rOPINIRole (REQUIP) 0.5 MG tablet TAKE 1 TABLET BY MOUTH AT BEDTIME 30 tablet 0  . rosuvastatin (CRESTOR) 20 MG tablet Take 1 tablet by mouth once daily 90 tablet 0  . Semaglutide, 1 MG/DOSE, (OZEMPIC, 1 MG/DOSE,) 2 MG/1.5ML SOPN Inject 1 mg into the skin once a week. 1.5 mL 0  . sertraline (ZOLOFT) 50 MG tablet Take 1 tablet by mouth twice daily 180 tablet 1  . tamsulosin (FLOMAX) 0.4 MG  CAPS capsule TAKE 1 CAPSULE BY MOUTH BEFORE BEDTIME 90 capsule 3  . torsemide (DEMADEX) 20 MG tablet Take 1 tablet (20 mg total) by mouth 2 (two) times daily. Take an extra tablet (20 mg) in the afternoon on Monday, Wednesday, Friday. (Patient taking differently: Take 40 mg by mouth 2 (two) times daily. 2 in the am and 2 at noon) 72 tablet 3  . VENTOLIN HFA 108 (90 Base) MCG/ACT inhaler Inhale 2 puffs into the lungs every 4 (four) hours as needed for wheezing or shortness of breath. 18 g 11   No facility-administered medications prior to visit.    Allergies  Allergen Reactions  . Androgel [Testosterone] Other (See Comments)    Pedal edema   . Biaxin [Clarithromycin] Other (See Comments)    Taste perception alteration  . Duloxetine Other (See Comments)    Hallucinations  . Gabapentin Other (See Comments)    hallucinations  . Ibuprofen Other (See Comments)    GI upset   . Lyrica [Pregabalin] Swelling  . Spironolactone Other (See Comments)    ROS Review of Systems  Constitutional: Negative for chills, fatigue and fever.  HENT: Negative for congestion, ear pain and sore throat.   Respiratory: Negative for cough and shortness of breath.   Cardiovascular: Negative for chest pain.  Gastrointestinal: Negative for abdominal pain, constipation, diarrhea, nausea and vomiting.  Genitourinary: Negative for dysuria, frequency and urgency.  Musculoskeletal: Positive for back pain  (Recently had back injections). Negative for arthralgias and myalgias.  Neurological: Negative for dizziness and headaches.  Psychiatric/Behavioral: Negative for agitation and sleep disturbance. The patient is not nervous/anxious.       Objective:    Physical Exam Vitals reviewed.  Constitutional:      Appearance: Normal appearance. He is well-developed. He is obese.  Neck:     Vascular: No carotid bruit.  Cardiovascular:     Rate and Rhythm: Normal rate and regular rhythm.     Pulses: Normal pulses.     Heart sounds: Normal heart sounds.  Pulmonary:     Effort: Pulmonary effort is normal.     Breath sounds: Normal breath sounds.  Abdominal:     General: Bowel sounds are normal.     Palpations: Abdomen is soft.     Tenderness: There is no abdominal tenderness.  Musculoskeletal:     Right lower leg: No edema.     Left lower leg: No edema.  Neurological:     Mental Status: He is alert and oriented to person, place, and time.  Psychiatric:        Mood and Affect: Mood normal.        Behavior: Behavior normal.     BP 128/84 (BP Location: Right Arm, Patient Position: Sitting, Cuff Size: Large)   Pulse 74   Temp (!) 97.3 F (36.3 C) (Temporal)   Ht 6' (1.829 m)   Wt (!) 328 lb (148.8 kg)   SpO2 93%   BMI 44.48 kg/m  Wt Readings from Last 3 Encounters:  01/14/21 (!) 328 lb (148.8 kg)  12/31/20 (!) 325 lb (147.4 kg)  10/17/20 (!) 341 lb (154.7 kg)     Health Maintenance Due  Topic Date Due  . Hepatitis C Screening  Never done  . OPHTHALMOLOGY EXAM  Never done  . TETANUS/TDAP  Never done    There are no preventive care reminders to display for this patient.  No results found for: TSH Lab Results  Component Value Date   WBC  12.1 (H) 12/10/2020   HGB 13.2 12/10/2020   HCT 41.4 12/10/2020   MCV 87 12/10/2020   PLT 270 12/10/2020   Lab Results  Component Value Date   NA 137 12/10/2020   K 4.7 12/10/2020   CO2 28 12/10/2020   GLUCOSE 149 (H) 12/10/2020    BUN 17 12/10/2020   CREATININE 1.32 (H) 12/10/2020   BILITOT 0.7 12/10/2020   ALKPHOS 173 (H) 12/10/2020   AST 35 12/10/2020   ALT 23 12/10/2020   PROT 6.5 12/10/2020   ALBUMIN 3.9 12/10/2020   CALCIUM 10.1 12/10/2020   Lab Results  Component Value Date   CHOL 126 12/10/2020   Lab Results  Component Value Date   HDL 35 (L) 12/10/2020   Lab Results  Component Value Date   LDLCALC 65 12/10/2020   Lab Results  Component Value Date   TRIG 148 12/10/2020   Lab Results  Component Value Date   CHOLHDL 3.6 12/10/2020   Lab Results  Component Value Date   HGBA1C 9.4 (H) 12/10/2020      Assessment & Plan:   1. Diabetic glomerulopathy ([place]) Diabetes improved. Recommend continue to work on eating healthy diet. The current medical regimen is effective;  continue present plan and medications. - POCT UA - Microalbumin 80.  2. Hypertensive heart and kidney disease with chronic diastolic congestive heart failure and stage 3a chronic kidney disease (HCC) Well controlled.  No changes to medicines.  Continue to work on eating a healthy diet.  3. Mixed hyperlipidemia The current medical regimen is effective;  continue present plan and medications.  4. Atrial fibrillation, chronic (HCC) The current medical regimen is effective;  continue present plan and medications.  5. Chronic obstructive pulmonary disease, unspecified COPD type ([place]) The current medical regimen is effective;  continue present plan and medications.  6. Chronic pain syndrome Mgmt by pain clinic.  7. Uncomplicated opioid dependence ([place]) Mgmt by pain clcinic.   8. Acquired thrombophilia On eliquis due to needing it for atrial fibrillation.   Follow-up: Return in about 3 months (around 04/13/2021) for fasting.    Rochel Brome, MD

## 2021-01-17 ENCOUNTER — Telehealth: Payer: Self-pay

## 2021-01-17 NOTE — Progress Notes (Signed)
Chronic Care Management Pharmacy Assistant   Name: Shawn Sullivan  MRN: [id number] DOB: [birth year]  Reason for Encounter: Medication Review for Dexcom system    PCP : Rochel Brome, MD  Allergies:   Allergies  Allergen Reactions  . Androgel [Testosterone] Other (See Comments)    Pedal edema   . Biaxin [Clarithromycin] Other (See Comments)    Taste perception alteration  . Duloxetine Other (See Comments)    Hallucinations  . Gabapentin Other (See Comments)    hallucinations  . Ibuprofen Other (See Comments)    GI upset   . Lyrica [Pregabalin] Swelling  . Spironolactone Other (See Comments)    Medications: Outpatient Encounter Medications as of 01/17/2021  Medication Sig  . acetaminophen (TYLENOL) 500 MG tablet Take 1,000 mg by mouth every 6 (six) hours as needed for moderate pain or headache.  [name] amiodarone (PACERONE) 200 MG tablet Take 1 tablet (200 mg total) by mouth every evening.  [name] apixaban (ELIQUIS) 5 MG TABS tablet Take 5 mg by mouth 2 (two) times daily.   [name] b complex vitamins tablet Take 1 tablet by mouth daily.  . BD PEN NEEDLE MICRO U/F 32G X 6 MM MISC SMARTSIG:1 Needle SUB-Q Daily  . Budeson-Glycopyrrol-Formoterol (BREZTRI AEROSPHERE) 160-9-4.8 MCG/ACT AERO Inhale 2 puffs into the lungs 2 (two) times daily.  . Catheters (FREEDOM CATH MALE EXT CATHETER) MISC 1 each by Does not apply route daily.  . cholecalciferol (VITAMIN D3) 25 MCG (1000 UT) tablet Take 1,000 Units by mouth daily.   [name] CINNAMON PO Take 700 mg by mouth daily.   . dapagliflozin propanediol (FARXIGA) 5 MG TABS tablet Take 1 tablet (5 mg total) by mouth daily. Patient willing to begin. Pharmacist faxing order to [facility].  . folic acid (FOLVITE) 616 MCG tablet Take 800 mcg by mouth daily.  [name] HYDROcodone-acetaminophen (NORCO) 10-325 MG tablet Take 1 tablet by mouth every 4 (four) hours as needed for moderate pain.  [name] insulin degludec (TRESIBA FLEXTOUCH) 100 UNIT/ML FlexTouch Pen Inject 50 Units  into the skin daily.  [name] ipratropium-albuterol (DUONEB) 0.5-2.5 (3) MG/3ML SOLN Take 3 mLs by nebulization every 6 (six) hours as needed (shortness of breath).   [name] levocetirizine (XYZAL) 5 MG tablet Take 5 mg by mouth daily as needed for allergies.  . magnesium chloride (SLOW-MAG) 64 MG TBEC SR tablet Take 1 tablet (64 mg total) by mouth 2 (two) times daily. (Patient taking differently: Take 1 tablet by mouth daily.)  . metolazone (ZAROXOLYN) 5 MG tablet Take 5 mg by mouth once a week. Uses prn if swollen once weekly.  . metoprolol tartrate (LOPRESSOR) 50 MG tablet Take 1 tablet (50 mg total) by mouth in the morning, at noon, and at bedtime.  . montelukast (SINGULAIR) 10 MG tablet Take 1 tablet (10 mg total) by mouth every evening.  . Omeprazole 20 MG TBEC Take 20 mg by mouth 2 (two) times daily before a meal.   . potassium chloride SA (K-DUR) 20 MEQ tablet Take 20 mEq by mouth 2 (two) times daily.  [name] pyridOXINE (VITAMIN B-6) 100 MG tablet Take 100 mg by mouth daily.   [name] rOPINIRole (REQUIP) 0.5 MG tablet TAKE 1 TABLET BY MOUTH AT BEDTIME  . rosuvastatin (CRESTOR) 20 MG tablet Take 1 tablet by mouth once daily  . Semaglutide, 1 MG/DOSE, (OZEMPIC, 1 MG/DOSE,) 2 MG/1.5ML SOPN Inject 1 mg into the skin once a week.  . sertraline (ZOLOFT) 50 MG tablet Take 1 tablet by  mouth twice daily  . tamsulosin (FLOMAX) 0.4 MG CAPS capsule TAKE 1 CAPSULE BY MOUTH BEFORE BEDTIME  . torsemide (DEMADEX) 20 MG tablet Take 1 tablet (20 mg total) by mouth 2 (two) times daily. Take an extra tablet (20 mg) in the afternoon on Monday, Wednesday, Friday. (Patient taking differently: Take 40 mg by mouth 2 (two) times daily. 2 in the am and 2 at noon)  . VENTOLIN HFA 108 (90 Base) MCG/ACT inhaler Inhale 2 puffs into the lungs every 4 (four) hours as needed for wheezing or shortness of breath.   No facility-administered encounter medications on file as of 01/17/2021.    Current Diagnosis: Patient Active Problem List    Diagnosis Date Noted  . Acute sinusitis 10/17/2020  . Orchitis 07/18/2020  . Testicular pain, right 07/15/2020  . Atrial fibrillation, chronic ([place]) 05/31/2020  . Anxiety 05/31/2020  . BMI 45.0-49.9, adult ([place]) 04/11/2020  . Balanitis 04/11/2020  . Diabetes mellitus due to underlying condition, controlled, with diabetic polyneuropathy, with long-term current use of insulin ([place]) 02/23/2020  . Gastroesophageal reflux disease without esophagitis 02/23/2020  . Chronic anticoagulation 06/14/2019  . Hypertensive heart and kidney disease with chronic diastolic congestive heart failure and stage 3 chronic kidney disease ([place]) 06/14/2019  . Persistent atrial fibrillation ([place]) 06/14/2019  . Medication management 04/19/2019  . On amiodarone therapy 03/24/2019  . Polyneuropathy associated with underlying disease ([place]) 10/11/2018  . Hypokalemia 10/04/2018  . PAF (paroxysmal atrial fibrillation) ([place]) 07/01/2018  . Anticoagulated 07/01/2018  . CKD (chronic kidney disease) stage 3, GFR 30-59 ml/min (HCC) 07/01/2018  . COPD (chronic obstructive pulmonary disease) ([place]) 07/01/2018  . Obstructive sleep apnea 07/01/2018  . Hypertensive heart disease 07/01/2018  . Edema of both lower extremities 06/20/2018  . Shortness of breath 06/20/2018  . Obesity, morbid, BMI 40.0-49.9 ([place]) 10/04/2017  . Chronic pain syndrome 08/30/2017  . Restrictive lung disease 06/22/2016  . Generalized edema 06/22/2016  . Restless legs syndrome 03/23/2016  . Presbyopia of both eyes 12/16/2015  . Hyperlipidemia 12/16/2015  . Bilateral pseudophakia 12/16/2015  . Dermatochalasis 12/16/2015  . Chronic obstructive pulmonary disease ([place]) 11/05/2015  . Type 2 diabetes mellitus without complications ([place]) 38/25/0539  . Edema 11/04/2015  . Piriformis syndrome 05/03/2014  . Disorder of bursae and tendons in shoulder region 01/05/2014  . Low back pain 08/17/2013  . Sleep apnea 05/06/2013  . Postlaminectomy syndrome, lumbar  region 05/06/2013  . Lumbosacral spondylosis 05/06/2013  . Depressive disorder 05/06/2013  . Chronic pain 05/06/2013  . Benign essential hypertension 05/06/2013    Donette Larry, CPP requested that I contact [employer] if reference to getting a price for the glucose sensors for the system.  The office representative had left a reader at the office.  I called the company and spoke with Lorre Nick, she stated there is no way to determine the cost of the sensors ahead of time, they are ran through the patients insurance, it takes 30-45 days for the insurance to pay and then the remaining is billed to the patient.  I have let Donette Larry, CPP know the outcome.   Follow-Up:  Pharmacist Review  Donette Larry, CPP notified  Clarita Leber, [place] [facility] Pharmacist Assistant [phone]

## 2021-01-21 ENCOUNTER — Other Ambulatory Visit: Payer: Self-pay | Admitting: Physician Assistant

## 2021-01-22 ENCOUNTER — Telehealth: Payer: Self-pay

## 2021-01-22 ENCOUNTER — Other Ambulatory Visit: Payer: Self-pay

## 2021-01-22 MED ORDER — FREESTYLE LIBRE 2 SENSOR MISC: 3 refills | Status: DC

## 2021-01-22 NOTE — Progress Notes (Signed)
Chronic Care Management Pharmacy Assistant   Name: Shawn Sullivan  MRN: [id number] DOB: Dec 20, 1945  Reason for Encounter: Medication Review for freestyle sensors   PCP : Rochel Brome, MD  Allergies:   Allergies  Allergen Reactions  . Androgel [Testosterone] Other (See Comments)    Pedal edema   . Biaxin [Clarithromycin] Other (See Comments)    Taste perception alteration  . Duloxetine Other (See Comments)    Hallucinations  . Gabapentin Other (See Comments)    hallucinations  . Ibuprofen Other (See Comments)    GI upset   . Lyrica [Pregabalin] Swelling  . Spironolactone Other (See Comments)    Medications: Outpatient Encounter Medications as of 01/22/2021  Medication Sig  . acetaminophen (TYLENOL) 500 MG tablet Take 1,000 mg by mouth every 6 (six) hours as needed for moderate pain or headache.  [name] amiodarone (PACERONE) 200 MG tablet Take 1 tablet (200 mg total) by mouth every evening.  [name] apixaban (ELIQUIS) 5 MG TABS tablet Take 5 mg by mouth 2 (two) times daily.   [name] b complex vitamins tablet Take 1 tablet by mouth daily.  . BD PEN NEEDLE MICRO U/F 32G X 6 MM MISC SMARTSIG:1 Needle SUB-Q Daily  . Budeson-Glycopyrrol-Formoterol (BREZTRI AEROSPHERE) 160-9-4.8 MCG/ACT AERO Inhale 2 puffs into the lungs 2 (two) times daily.  . Catheters (FREEDOM CATH MALE EXT CATHETER) MISC 1 each by Does not apply route daily.  . cholecalciferol (VITAMIN D3) 25 MCG (1000 UT) tablet Take 1,000 Units by mouth daily.   [name] CINNAMON PO Take 700 mg by mouth daily.   . dapagliflozin propanediol (FARXIGA) 5 MG TABS tablet Take 1 tablet (5 mg total) by mouth daily. Patient willing to begin. Pharmacist faxing order to [facility].  . folic acid (FOLVITE) 258 MCG tablet Take 800 mcg by mouth daily.  [name] HYDROcodone-acetaminophen (NORCO) 10-325 MG tablet Take 1 tablet by mouth every 4 (four) hours as needed for moderate pain.  [name] insulin degludec (TRESIBA FLEXTOUCH) 100 UNIT/ML FlexTouch Pen Inject 50  Units into the skin daily.  [name] ipratropium-albuterol (DUONEB) 0.5-2.5 (3) MG/3ML SOLN Take 3 mLs by nebulization every 6 (six) hours as needed (shortness of breath).   [name] levocetirizine (XYZAL) 5 MG tablet Take 5 mg by mouth daily as needed for allergies.  . magnesium chloride (SLOW-MAG) 64 MG TBEC SR tablet Take 1 tablet (64 mg total) by mouth 2 (two) times daily. (Patient taking differently: Take 1 tablet by mouth daily.)  . metolazone (ZAROXOLYN) 5 MG tablet Take 5 mg by mouth once a week. Uses prn if swollen once weekly.  . metoprolol tartrate (LOPRESSOR) 50 MG tablet Take 1 tablet (50 mg total) by mouth in the morning, at noon, and at bedtime.  . montelukast (SINGULAIR) 10 MG tablet Take 1 tablet (10 mg total) by mouth every evening.  . Omeprazole 20 MG TBEC Take 20 mg by mouth 2 (two) times daily before a meal.   . potassium chloride SA (K-DUR) 20 MEQ tablet Take 20 mEq by mouth 2 (two) times daily.  [name] pyridOXINE (VITAMIN B-6) 100 MG tablet Take 100 mg by mouth daily.   [name] rOPINIRole (REQUIP) 0.5 MG tablet TAKE 1 TABLET BY MOUTH AT BEDTIME  . rosuvastatin (CRESTOR) 20 MG tablet Take 1 tablet by mouth once daily  . Semaglutide, 1 MG/DOSE, (OZEMPIC, 1 MG/DOSE,) 2 MG/1.5ML SOPN Inject 1 mg into the skin once a week.  . sertraline (ZOLOFT) 50 MG tablet Take 1 tablet by mouth  twice daily  . tamsulosin (FLOMAX) 0.4 MG CAPS capsule TAKE 1 CAPSULE BY MOUTH BEFORE BEDTIME  . torsemide (DEMADEX) 20 MG tablet Take 1 tablet (20 mg total) by mouth 2 (two) times daily. Take an extra tablet (20 mg) in the afternoon on Monday, Wednesday, Friday. (Patient taking differently: Take 40 mg by mouth 2 (two) times daily. 2 in the am and 2 at noon)  . VENTOLIN HFA 108 (90 Base) MCG/ACT inhaler Inhale 2 puffs into the lungs every 4 (four) hours as needed for wheezing or shortness of breath.   No facility-administered encounter medications on file as of 01/22/2021.    Current Diagnosis: Patient Active Problem List    Diagnosis Date Noted  . Acute sinusitis 10/17/2020  . Orchitis 07/18/2020  . Testicular pain, right 07/15/2020  . Atrial fibrillation, chronic ([place]) 05/31/2020  . Anxiety 05/31/2020  . BMI 45.0-49.9, adult ([place]) 04/11/2020  . Balanitis 04/11/2020  . Diabetes mellitus due to underlying condition, controlled, with diabetic polyneuropathy, with long-term current use of insulin ([place]) 02/23/2020  . Gastroesophageal reflux disease without esophagitis 02/23/2020  . Chronic anticoagulation 06/14/2019  . Hypertensive heart and kidney disease with chronic diastolic congestive heart failure and stage 3 chronic kidney disease ([place]) 06/14/2019  . Persistent atrial fibrillation ([place]) 06/14/2019  . Medication management 04/19/2019  . On amiodarone therapy 03/24/2019  . Polyneuropathy associated with underlying disease ([place]) 10/11/2018  . Hypokalemia 10/04/2018  . PAF (paroxysmal atrial fibrillation) ([place]) 07/01/2018  . Anticoagulated 07/01/2018  . CKD (chronic kidney disease) stage 3, GFR 30-59 ml/min ([facility]) 07/01/2018  . COPD (chronic obstructive pulmonary disease) ([place]) 07/01/2018  . Obstructive sleep apnea 07/01/2018  . Hypertensive heart disease 07/01/2018  . Edema of both lower extremities 06/20/2018  . Shortness of breath 06/20/2018  . Obesity, morbid, BMI 40.0-49.9 ([place]) 10/04/2017  . Chronic pain syndrome 08/30/2017  . Restrictive lung disease 06/22/2016  . Generalized edema 06/22/2016  . Restless legs syndrome 03/23/2016  . Presbyopia of both eyes 12/16/2015  . Hyperlipidemia 12/16/2015  . Bilateral pseudophakia 12/16/2015  . Dermatochalasis 12/16/2015  . Chronic obstructive pulmonary disease ([place]) 11/05/2015  . Type 2 diabetes mellitus without complications ([place]) 41/96/2229  . Edema 11/04/2015  . Piriformis syndrome 05/03/2014  . Disorder of bursae and tendons in shoulder region 01/05/2014  . Low back pain 08/17/2013  . Sleep apnea 05/06/2013  . Postlaminectomy syndrome, lumbar  region 05/06/2013  . Lumbosacral spondylosis 05/06/2013  . Depressive disorder 05/06/2013  . Chronic pain 05/06/2013  . Benign essential hypertension 05/06/2013    Donette Larry, CPP , asked me to call the patient pharmacy to see if they could tell is what the cost of just his sensors would be.  I called the pharmacy, I was told there is no way to tell what the cost will be without a prescription to run through his insurance company.   I notified Donette Larry, CPP of the information I had found out.  Donette Larry, CPP has requested a script be sent over to the pharmacy.  I will follow up later today to check status.   I called the pharmacy and they stated his insurance did not cover his medication and the cash price was $72.00.  The pharmacy notified the patient.  I notified Donette Larry, CPP  Follow-Up:  Pharmacist Review  Donette Larry, CPP notified  Clarita Leber, [place] Pharmacist Assistant [phone]

## 2021-02-03 ENCOUNTER — Telehealth: Payer: Self-pay

## 2021-02-03 ENCOUNTER — Telehealth (INDEPENDENT_AMBULATORY_CARE_PROVIDER_SITE_OTHER): Payer: Medicare HMO | Admitting: Family Medicine

## 2021-02-03 VITALS — BP 149/92 | HR 71 | Wt 326.0 lb

## 2021-02-03 DIAGNOSIS — L02211 Cutaneous abscess of abdominal wall: Secondary | ICD-10-CM

## 2021-02-03 MED ORDER — MUPIROCIN 2 % EX OINT: 1.0000 "application " | TOPICAL_OINTMENT | Freq: Two times a day (BID) | CUTANEOUS | 1 refills | Status: DC

## 2021-02-03 MED ORDER — SULFAMETHOXAZOLE-TRIMETHOPRIM 800-160 MG PO TABS: 1.0000 | ORAL_TABLET | Freq: Two times a day (BID) | ORAL | 0 refills | Status: DC

## 2021-02-03 NOTE — Telephone Encounter (Signed)
The following message was sent to Dr. Tobie Poet and her nurses: Pt called this afternoon requesting for a rx to be called in for MRSA. Pt stated that he has a small place on his stomach. I tried to schedule him an in-office appointment but he had stated gas is too high. I told him that I would ask if something can be called in but more than likely he would need to come in for an appointment. can a rx be called in or does he need to come in?

## 2021-02-03 NOTE — Progress Notes (Signed)
Virtual Visit via Video Note   This visit type was conducted due to national recommendations for restrictions regarding the COVID-19 Pandemic (e.g. social distancing) in an effort to limit this patient's exposure and mitigate transmission in our community.  Due to his co-morbid illnesses, this patient is at least at moderate risk for complications without adequate follow up.  This format is felt to be most appropriate for this patient at this time.  All issues noted in this document were discussed and addressed.  A limited physical exam was performed with this format.  A verbal consent was obtained for the virtual visit.   Patient Location:home Provider Location:office Evaluation Performed:  Acute visit   Acute Office Visit  Subjective:    Patient ID: Shawn Sullivan, male    DOB: Nov 06, 1946, 75 y.o.   MRN: [id number]  Chief Complaint  Patient presents with  . Recurrent Skin Infections    HPI Patient is in today for abscess in abdomen. No fever, chills. Came up over the last week. Has recurrent issues with boils.   Past Medical History:  Diagnosis Date  . Anticoagulated 07/01/2018  . Anticoagulated 07/01/2018  . Benign essential hypertension 05/06/2013  . Chronic obstructive pulmonary disease ([facility]) 11/05/2015  . COPD (chronic obstructive pulmonary disease) ([place]) 07/01/2018  . Dermatochalasis 12/16/2015  . Disorder of bursae and tendons in shoulder region 01/05/2014  . Generalized edema 06/22/2016  . Hyperlipidemia 12/16/2015  . Hypertensive heart disease 07/01/2018  . Low back pain 08/17/2013  . Lumbosacral spondylosis 05/06/2013  . Obstructive sleep apnea 07/01/2018  . Persistent atrial fibrillation ([place]) 07/01/2018  . Piriformis syndrome 05/03/2014  . Postlaminectomy syndrome, lumbar region 05/06/2013  . Presbyopia of both eyes 12/16/2015  . Restless legs syndrome 03/23/2016  . Restrictive lung disease 06/22/2016  . Sleep apnea 05/06/2013  . Type 2 diabetes mellitus without complications ([place])  51/05/16    Past Surgical History:  Procedure Laterality Date  . APPENDECTOMY    . BACK SURGERY    . CARDIOVERSION N/A 12/01/2018   Procedure: CARDIOVERSION;  Surgeon: Sanda Klein, MD;  Location: [facility];  Service: Cardiovascular;  Laterality: N/A;  . CATARACT EXTRACTION Bilateral   . LUMBAR FUSION    . NECK SURGERY      Family History  Problem Relation Age of Onset  . Atrial fibrillation Mother   . Cancer Mother   . Heart disease Father     Social History   Socioeconomic History  . Marital status: Married    Spouse name: Not on file  . Number of children: Not on file  . Years of education: Not on file  . Highest education level: Not on file  Occupational History  . Not on file  Tobacco Use  . Smoking status: Former Smoker    Quit date: 1993    Years since quitting: 29.2  . Smokeless tobacco: Never Used  Vaping Use  . Vaping Use: Never used  Substance and Sexual Activity  . Alcohol use: Not Currently  . Drug use: Not Currently    Types: Hydrocodone  . Sexual activity: Not on file  Other Topics Concern  . Not on file  Social History Narrative  . Not on file   Social Determinants of Health   Financial Resource Strain: Not on file  Food Insecurity: No Food Insecurity  . Worried About Charity fundraiser in the Last Year: Never true  . Ran Out of Food in the Last Year: Never true  Transportation  Needs: Not on file  Physical Activity: Inactive  . Days of Exercise per Week: 0 days  . Minutes of Exercise per Session: 0 min  Stress: Not on file  Social Connections: Not on file  Intimate Partner Violence: Not on file    Outpatient Medications Prior to Visit  Medication Sig Dispense Refill  . acetaminophen (TYLENOL) 500 MG tablet Take 1,000 mg by mouth every 6 (six) hours as needed for moderate pain or headache.    [name] amiodarone (PACERONE) 200 MG tablet Take 1 tablet (200 mg total) by mouth every evening. 90 tablet 3  . apixaban (ELIQUIS) 5 MG TABS  tablet Take 5 mg by mouth 2 (two) times daily.     [name] b complex vitamins tablet Take 1 tablet by mouth daily.    . BD PEN NEEDLE MICRO U/F 32G X 6 MM MISC SMARTSIG:1 Needle SUB-Q Daily    . Budeson-Glycopyrrol-Formoterol (BREZTRI AEROSPHERE) 160-9-4.8 MCG/ACT AERO Inhale 2 puffs into the lungs 2 (two) times daily. 10.7 g 0  . Catheters (FREEDOM CATH MALE EXT CATHETER) MISC 1 each by Does not apply route daily. 30 each 3  . cholecalciferol (VITAMIN D3) 25 MCG (1000 UT) tablet Take 1,000 Units by mouth daily.     [name] CINNAMON PO Take 700 mg by mouth daily.     . folic acid (FOLVITE) 161 MCG tablet Take 800 mcg by mouth daily.    [name] HYDROcodone-acetaminophen (NORCO) 10-325 MG tablet Take 1 tablet by mouth every 4 (four) hours as needed for moderate pain.    [name] insulin degludec (TRESIBA FLEXTOUCH) 100 UNIT/ML FlexTouch Pen Inject 50 Units into the skin daily.    [name] ipratropium-albuterol (DUONEB) 0.5-2.5 (3) MG/3ML SOLN Take 3 mLs by nebulization every 6 (six) hours as needed (shortness of breath).     [name] levocetirizine (XYZAL) 5 MG tablet Take 5 mg by mouth daily as needed for allergies.    . magnesium chloride (SLOW-MAG) 64 MG TBEC SR tablet Take 1 tablet (64 mg total) by mouth 2 (two) times daily. (Patient taking differently: Take 1 tablet by mouth daily.) 60 tablet 3  . metolazone (ZAROXOLYN) 5 MG tablet Take 5 mg by mouth once a week. Uses prn if swollen once weekly.    . metoprolol tartrate (LOPRESSOR) 50 MG tablet Take 1 tablet (50 mg total) by mouth in the morning, at noon, and at bedtime. 270 tablet 1  . montelukast (SINGULAIR) 10 MG tablet Take 1 tablet (10 mg total) by mouth every evening. 90 tablet 3  . Omeprazole 20 MG TBEC Take 20 mg by mouth 2 (two) times daily before a meal.     . potassium chloride SA (K-DUR) 20 MEQ tablet Take 20 mEq by mouth 2 (two) times daily.    [name] pyridOXINE (VITAMIN B-6) 100 MG tablet Take 100 mg by mouth daily.     [name] rOPINIRole (REQUIP) 0.5 MG tablet TAKE 1 TABLET BY  MOUTH AT BEDTIME 30 tablet 0  . rosuvastatin (CRESTOR) 20 MG tablet Take 1 tablet by mouth once daily 90 tablet 0  . Semaglutide, 1 MG/DOSE, (OZEMPIC, 1 MG/DOSE,) 2 MG/1.5ML SOPN Inject 1 mg into the skin once a week. 1.5 mL 0  . sertraline (ZOLOFT) 50 MG tablet Take 1 tablet by mouth twice daily 180 tablet 1  . tamsulosin (FLOMAX) 0.4 MG CAPS capsule TAKE 1 CAPSULE BY MOUTH BEFORE BEDTIME 90 capsule 3  . torsemide (DEMADEX) 20 MG tablet Take 1 tablet (20 mg total) by mouth  2 (two) times daily. Take an extra tablet (20 mg) in the afternoon on Monday, Wednesday, Friday. (Patient taking differently: Take 40 mg by mouth 2 (two) times daily. 2 in the am and 2 at noon) 72 tablet 3  . VENTOLIN HFA 108 (90 Base) MCG/ACT inhaler Inhale 2 puffs into the lungs every 4 (four) hours as needed for wheezing or shortness of breath. 18 g 11  . Continuous Blood Gluc Sensor (FREESTYLE LIBRE 2 SENSOR) MISC E11.9 Change sensor every 14 days as directed 6 each 3  . dapagliflozin propanediol (FARXIGA) 5 MG TABS tablet Take 1 tablet (5 mg total) by mouth daily. Patient willing to begin. Pharmacist faxing order to [facility]. 90 tablet 3   No facility-administered medications prior to visit.    Allergies  Allergen Reactions  . Androgel [Testosterone] Other (See Comments)    Pedal edema   . Biaxin [Clarithromycin] Other (See Comments)    Taste perception alteration  . Duloxetine Other (See Comments)    Hallucinations  . Gabapentin Other (See Comments)    hallucinations  . Ibuprofen Other (See Comments)    GI upset   . Lyrica [Pregabalin] Swelling  . Spironolactone Other (See Comments)    Review of Systems  Constitutional: Negative for chills and fever.  HENT: Negative for congestion, rhinorrhea and sore throat.   Respiratory: Positive for cough and shortness of breath.   Cardiovascular: Negative for chest pain and palpitations.  Gastrointestinal: Negative for abdominal pain, constipation, diarrhea, nausea  and vomiting.  Genitourinary: Negative for dysuria and urgency.  Musculoskeletal: Negative for arthralgias, back pain and myalgias.  Skin: Positive for wound (boil on abdomen).  Neurological: Positive for light-headedness. Negative for dizziness and headaches.  Psychiatric/Behavioral: Negative for dysphoric mood. The patient is not nervous/anxious.        Objective:    Physical Exam Abscess of abdomen. Induration. Tender.   BP (!) 149/92   Pulse 71   Wt (!) 326 lb (147.9 kg)   BMI 44.21 kg/m  Wt Readings from Last 3 Encounters:  02/03/21 (!) 326 lb (147.9 kg)  01/14/21 (!) 328 lb (148.8 kg)  12/31/20 (!) 325 lb (147.4 kg)    Health Maintenance Due  Topic Date Due  . Hepatitis C Screening  Never done  . OPHTHALMOLOGY EXAM  Never done  . TETANUS/TDAP  Never done    There are no preventive care reminders to display for this patient.   No results found for: TSH Lab Results  Component Value Date   WBC 12.1 (H) 12/10/2020   HGB 13.2 12/10/2020   HCT 41.4 12/10/2020   MCV 87 12/10/2020   PLT 270 12/10/2020   Lab Results  Component Value Date   NA 137 12/10/2020   K 4.7 12/10/2020   CO2 28 12/10/2020   GLUCOSE 149 (H) 12/10/2020   BUN 17 12/10/2020   CREATININE 1.32 (H) 12/10/2020   BILITOT 0.7 12/10/2020   ALKPHOS 173 (H) 12/10/2020   AST 35 12/10/2020   ALT 23 12/10/2020   PROT 6.5 12/10/2020   ALBUMIN 3.9 12/10/2020   CALCIUM 10.1 12/10/2020   Lab Results  Component Value Date   CHOL 126 12/10/2020   Lab Results  Component Value Date   HDL 35 (L) 12/10/2020   Lab Results  Component Value Date   LDLCALC 65 12/10/2020   Lab Results  Component Value Date   TRIG 148 12/10/2020   Lab Results  Component Value Date   CHOLHDL 3.6 12/10/2020  Lab Results  Component Value Date   HGBA1C 9.4 (H) 12/10/2020       Assessment & Plan:  1. Abscess of skin of abdomen - sulfamethoxazole-trimethoprim (BACTRIM DS) 800-160 MG tablet; Take 1 tablet by  mouth 2 (two) times daily.  Dispense: 14 tablet; Refill: 0 - mupirocin ointment (BACTROBAN) 2 %; Apply 1 application topically 2 (two) times daily.  Dispense: 30 g; Refill: 1    Meds ordered this encounter  Medications  . sulfamethoxazole-trimethoprim (BACTRIM DS) 800-160 MG tablet    Sig: Take 1 tablet by mouth 2 (two) times daily.    Dispense:  14 tablet    Refill:  0  . mupirocin ointment (BACTROBAN) 2 %    Sig: Apply 1 application topically 2 (two) times daily.    Dispense:  30 g    Refill:  1    I spent 10 minutes dedicated to the care of this patient on the date of this encounter to include photograph review and telephone communication as video visit would not work.   Follow-up: No follow-ups on file.  An After Visit Summary was printed and given to the patient.  Rochel Brome, MD [facility] [phone]

## 2021-02-06 DIAGNOSIS — E669 Obesity, unspecified: Secondary | ICD-10-CM | POA: Diagnosis not present

## 2021-02-06 DIAGNOSIS — N1832 Chronic kidney disease, stage 3b: Secondary | ICD-10-CM | POA: Diagnosis not present

## 2021-02-06 DIAGNOSIS — R600 Localized edema: Secondary | ICD-10-CM | POA: Diagnosis not present

## 2021-02-06 DIAGNOSIS — D631 Anemia in chronic kidney disease: Secondary | ICD-10-CM | POA: Diagnosis not present

## 2021-02-06 DIAGNOSIS — E876 Hypokalemia: Secondary | ICD-10-CM | POA: Diagnosis not present

## 2021-02-06 DIAGNOSIS — N189 Chronic kidney disease, unspecified: Secondary | ICD-10-CM | POA: Diagnosis not present

## 2021-02-06 DIAGNOSIS — N2581 Secondary hyperparathyroidism of renal origin: Secondary | ICD-10-CM | POA: Diagnosis not present

## 2021-02-06 DIAGNOSIS — I129 Hypertensive chronic kidney disease with stage 1 through stage 4 chronic kidney disease, or unspecified chronic kidney disease: Secondary | ICD-10-CM | POA: Diagnosis not present

## 2021-02-07 ENCOUNTER — Ambulatory Visit (INDEPENDENT_AMBULATORY_CARE_PROVIDER_SITE_OTHER): Payer: Medicare HMO

## 2021-02-07 ENCOUNTER — Other Ambulatory Visit: Payer: Self-pay

## 2021-02-07 DIAGNOSIS — E782 Mixed hyperlipidemia: Secondary | ICD-10-CM

## 2021-02-07 DIAGNOSIS — N1831 Chronic kidney disease, stage 3a: Secondary | ICD-10-CM

## 2021-02-07 DIAGNOSIS — I5032 Chronic diastolic (congestive) heart failure: Secondary | ICD-10-CM

## 2021-02-07 DIAGNOSIS — I130 Hypertensive heart and chronic kidney disease with heart failure and stage 1 through stage 4 chronic kidney disease, or unspecified chronic kidney disease: Secondary | ICD-10-CM | POA: Diagnosis not present

## 2021-02-07 DIAGNOSIS — E1121 Type 2 diabetes mellitus with diabetic nephropathy: Secondary | ICD-10-CM

## 2021-02-07 DIAGNOSIS — I482 Chronic atrial fibrillation, unspecified: Secondary | ICD-10-CM | POA: Diagnosis not present

## 2021-02-07 MED ORDER — DEXCOM G6 TRANSMITTER MISC: 4 refills | Status: DC

## 2021-02-07 MED ORDER — DEXCOM G6 SENSOR MISC: 2 refills | Status: DC

## 2021-02-07 MED ORDER — DEXCOM G6 RECEIVER DEVI: 0 refills | Status: DC

## 2021-02-07 NOTE — Patient Instructions (Addendum)
Visit Information  Goals Addressed            This Visit's Progress   . Learn More About My Health       Timeframe:  Long-Range Goal Priority:  High Start Date:   02/07/2021                          Expected End Date:      02/07/2022                  Follow Up Date 05/09/2021    - tell my story and reason for my visit - make a list of questions - repeat what I heard to make sure I understand - bring a list of my medicines to the visit - speak up when I don't understand    Why is this important?    The best way to learn about your health and care is by talking to the doctor and nurse.   They will answer your questions and give you information in the way that you like best.    Notes:     [name] Manage My Medicine       Timeframe:  Long-Range Goal Priority:  High Start Date:         02/07/2021                    Expected End Date:        02/07/2022               Follow Up Date 05/09/2021    - call for medicine refill 2 or 3 days before it runs out - call if I am sick and can't take my medicine - keep a list of all the medicines I take; vitamins and herbals too - use a pillbox to sort medicine    Why is this important?   . These steps will help you keep on track with your medicines.   Notes:     . Track and Manage Symptoms-Heart Failure       Timeframe:  Long-Range Goal Priority:  High Start Date:          02/07/2021                   Expected End Date:   02/07/2022                  Follow Up Date 05/09/2021    - eat more whole grains, fruits and vegetables, lean meats and healthy fats - track symptoms and what helps feel better or worse    Why is this important?    You will be able to handle your symptoms better if you keep track of them.   Making some simple changes to your lifestyle will help.   Eating healthy is one thing you can do to take good care of yourself.    Notes:       Patient Care Plan: [facility]CM Pharmacy Care Plan    Problem Identified: dm,  afib, chf, hld   Priority: High  Onset Date: 02/07/2021    Long-Range Goal: Disease Management   Start Date: 02/07/2021  Expected End Date: 02/07/2022  This Visit's Progress: On track  Priority: High  Note:    Current Barriers:  . Unable to achieve control of diabetes   Pharmacist Clinical Goal(s):  [name] Over the next 90 days, patient will verbalize ability to afford treatment  regimen . achieve adherence to monitoring guidelines and medication adherence to achieve therapeutic efficacy . achieve control of diabetes as evidenced by a1c through collaboration with PharmD and provider.   Interventions: . 1:1 collaboration with Rochel Brome, MD regarding development and update of comprehensive plan of care as evidenced by provider attestation and co-signature . Inter-disciplinary care team collaboration (see longitudinal plan of care) . Comprehensive medication review performed; medication list updated in electronic medical record  Hyperlipidemia: (LDL goal < 70) -Controlled -Current treatment: . Rosuvastatin 20 mg daily  -Medications previously tried: atorvastatin  -Current dietary patterns: daughter tries to get some healthy options when she cooks for them.  -Current exercise habits: minimal due to mobility and back pain  -Educated on Cholesterol goals;  Benefits of statin for ASCVD risk reduction; Importance of limiting foods high in cholesterol; -Counseled on diet and exercise extensively Recommended to continue current medication  Diabetes (A1c goal <7%) -Uncontrolled -Current medications: . Farxiga 5 mg daily - rcommend increase to 10 mg daily  . Tresiba 50 units daily . Ozempic 1 mg weekly  -Medications previously tried:  glipizide, metformin, Soliqua -Current home glucose readings . fasting glucose: <200 mg/dL . post prandial glucose: not reproted -Denies hypoglycemic/hyperglycemic symptoms -Current meal patterns:  . Patient sneaks cookies and snacks. Daughter tries to  prepare a balanced meal when cooking for patient.  -Current exercise: minimal  -Educated on A1c and blood sugar goals; Complications of diabetes including kidney damage, retinal damage, and cardiovascular disease; Benefits of routine self-monitoring of blood sugar; -Counseled to check feet daily and get yearly eye exams -Counseled on diet and exercise extensively Recommended increase Farxiga 10 mg daily if Dr. Tobie Poet approves.  Collaborated with Dr. Alyse Low nurse to send order for Dexcom to [facility] to evaluate cost.   Atrial Fibrillation (Goal: prevent stroke and major bleeding) -Controlled -CHADSVASC: 5  -Current treatment: . Rate/Rhythm control: amiodarone 200 mg daily in the evening and metoprolol 50 mg morning, noon and bedtime  . Anticoagulation: Eliquis 5 mg bid  -Medications previously tried: none reported -Home BP and HR readings: fluctuates  -Counseled on increased risk of stroke due to Afib and benefits of anticoagulation for stroke prevention; importance of adherence to anticoagulant exactly as prescribed; Pharmacist will coordinate patient assistance application once 3% spent on medications.   -Counseled on diet and exercise extensively Recommended to continue current medication Assessed patient finances. for eligibilty of renewing Eliquis patient assistance for 2022  Heart Failure (Goal: manage symptoms and prevent exacerbations) -Controlled -Last ejection fraction: 60-65% (Date: 10/21) -HF type: Diastolic -Current treatment: . torsemide 40 mg bid  . Metoprolol tartrate 50 mg tid . Metolazone 5 mg once weekly if needed -Medications previously tried: furosemide  -Current home BP/HR readings: ~130/80 -Current dietary habits: daughter tries to provide healthy options  -Current exercise habits: limited by mobility and pain  -Educated on Benefits of medications for managing symptoms and prolonging life Importance of blood pressure control -Counseled on diet and exercise  extensively Recommended to continue current medication  COPD (Goal: control symptoms and prevent exacerbations) -Controlled -Current treatment  . Breztri 2 puffs bid  . Ipratropium 3 mls every 6 hours prn  . Montelukast 10 mg daily  . Ventolin 2 puffs every 4 hours prn wheezing or shortness of breath -Medications previously tried: trelegy  -Patient reports consistent use of maintenance inhaler -Frequency of rescue inhaler use: weekly  -Counseled on Proper inhaler technique; Benefits of consistent maintenance inhaler use -Recommended to continue current medication  Patient Goals/Self-Care Activities . Over the next 90 days, patient will:  - take medications as prescribed focus on medication adherence by using pill box check glucose twice daily, document, and provide at future appointments check blood pressure daily, document, and provide at future appointments  Follow Up Plan: Telephone follow up appointment with care management team member scheduled for: 05/09/2021      The patient verbalized understanding of instructions, educational materials, and care plan provided today and declined offer to receive copy of patient instructions, educational materials, and care plan.  Telephone follow up appointment with pharmacy team member scheduled for: 05/09/2021  Burnice Logan, [facility]  [email or web address]">  DASH Eating Plan DASH stands for Dietary Approaches to Stop Hypertension. The DASH eating plan is a healthy eating plan that has been shown to:  Reduce high blood pressure (hypertension).  Reduce your risk for type 2 diabetes, heart disease, and stroke.  Help with weight loss. What are tips for following this plan? Reading food labels  Check food labels for the amount of salt (sodium) per serving. Choose foods with less than 5 percent of the Daily Value of sodium. Generally, foods with less than 300 milligrams (mg) of sodium per  serving fit into this eating plan.  To find whole grains, look for the word "whole" as the first word in the ingredient list. Shopping  Buy products labeled as "low-sodium" or "no salt added."  Buy fresh foods. Avoid canned foods and pre-made or frozen meals. Cooking  Avoid adding salt when cooking. Use salt-free seasonings or herbs instead of table salt or sea salt. Check with your health care provider or pharmacist before using salt substitutes.  Do not fry foods. Cook foods using healthy methods such as baking, boiling, grilling, roasting, and broiling instead.  Cook with heart-healthy oils, such as olive, canola, avocado, soybean, or sunflower oil. Meal planning  Eat a balanced diet that includes: ? 4 or more servings of fruits and 4 or more servings of vegetables each day. Try to fill one-half of your plate with fruits and vegetables. ? 6-8 servings of whole grains each day. ? Less than 6 oz (170 g) of lean meat, poultry, or fish each day. A 3-oz (85-g) serving of meat is about the same size as a deck of cards. One egg equals 1 oz (28 g). ? 2-3 servings of low-fat dairy each day. One serving is 1 cup (237 mL). ? 1 serving of nuts, seeds, or beans 5 times each week. ? 2-3 servings of heart-healthy fats. Healthy fats called omega-3 fatty acids are found in foods such as walnuts, flaxseeds, fortified milks, and eggs. These fats are also found in cold-water fish, such as sardines, salmon, and mackerel.  Limit how much you eat of: ? Canned or prepackaged foods. ? Food that is high in trans fat, such as some fried foods. ? Food that is high in saturated fat, such as fatty meat. ? Desserts and other sweets, sugary drinks, and other foods with added sugar. ? Full-fat dairy products.  Do not salt foods before eating.  Do not eat more than 4 egg yolks a week.  Try to eat at least 2 vegetarian meals a week.  Eat more home-cooked food and less restaurant, buffet, and fast food.    Lifestyle  When eating at a restaurant, ask that your food be prepared with less salt or no salt, if possible.  If you drink alcohol: ? Limit how much you use to:  0-1 drink  a day for women who are not pregnant.  0-2 drinks a day for men. ? Be aware of how much alcohol is in your drink. In the [place] one drink equals one 12 oz bottle of beer (355 mL), one 5 oz glass of wine (148 mL), or one 1 oz glass of hard liquor (44 mL). General information  Avoid eating more than 2,300 mg of salt a day. If you have hypertension, you may need to reduce your sodium intake to 1,500 mg a day.  Work with your health care provider to maintain a healthy body weight or to lose weight. Ask what an ideal weight is for you.  Get at least 30 minutes of exercise that causes your heart to beat faster (aerobic exercise) most days of the week. Activities may include walking, swimming, or biking.  Work with your health care provider or dietitian to adjust your eating plan to your individual calorie needs. What foods should I eat? Fruits All fresh, dried, or frozen fruit. Canned fruit in natural juice (without added sugar). Vegetables Fresh or frozen vegetables (raw, steamed, roasted, or grilled). Low-sodium or reduced-sodium tomato and vegetable juice. Low-sodium or reduced-sodium tomato sauce and tomato paste. Low-sodium or reduced-sodium canned vegetables. Grains Whole-grain or whole-wheat bread. Whole-grain or whole-wheat pasta. Brown rice. [place]row. Bulgur. Whole-grain and low-sodium cereals. Pita bread. Low-fat, low-sodium crackers. Whole-wheat flour tortillas. Meats and other proteins Skinless chicken or [place]. Ground chicken or [place]. Pork with fat trimmed off. Fish and seafood. Egg whites. Dried beans, peas, or lentils. Unsalted nuts, nut butters, and seeds. Unsalted canned beans. Lean cuts of beef with fat trimmed off. Low-sodium, lean precooked or cured meat, such as sausages or meat  loaves. Dairy Low-fat (1%) or fat-free (skim) milk. Reduced-fat, low-fat, or fat-free cheeses. Nonfat, low-sodium ricotta or cottage cheese. Low-fat or nonfat yogurt. Low-fat, low-sodium cheese. Fats and oils Soft margarine without trans fats. Vegetable oil. Reduced-fat, low-fat, or light mayonnaise and salad dressings (reduced-sodium). Canola, safflower, olive, avocado, soybean, and sunflower oils. Avocado. Seasonings and condiments Herbs. Spices. Seasoning mixes without salt. Other foods Unsalted popcorn and pretzels. Fat-free sweets. The items listed above may not be a complete list of foods and beverages you can eat. Contact a dietitian for more information. What foods should I avoid? Fruits Canned fruit in a light or heavy syrup. Fried fruit. Fruit in cream or butter sauce. Vegetables Creamed or fried vegetables. Vegetables in a cheese sauce. Regular canned vegetables (not low-sodium or reduced-sodium). Regular canned tomato sauce and paste (not low-sodium or reduced-sodium). Regular tomato and vegetable juice (not low-sodium or reduced-sodium). Angie Fava. Olives. Grains Baked goods made with fat, such as croissants, muffins, or some breads. Dry pasta or rice meal packs. Meats and other proteins Fatty cuts of meat. Ribs. Fried meat. Berniece Salines. Bologna, salami, and other precooked or cured meats, such as sausages or meat loaves. Fat from the back of a pig (fatback). Bratwurst. Salted nuts and seeds. Canned beans with added salt. Canned or smoked fish. Whole eggs or egg yolks. Chicken or [place] with skin. Dairy Whole or 2% milk, cream, and half-and-half. Whole or full-fat cream cheese. Whole-fat or sweetened yogurt. Full-fat cheese. Nondairy creamers. Whipped toppings. Processed cheese and cheese spreads. Fats and oils Butter. Stick margarine. Lard. Shortening. Ghee. Bacon fat. Tropical oils, such as coconut, palm kernel, or palm oil. Seasonings and condiments Onion salt, garlic salt, seasoned  salt, table salt, and sea salt. Worcestershire sauce. Tartar sauce. Barbecue sauce. Teriyaki sauce. Soy sauce, including reduced-sodium.  Steak sauce. Canned and packaged gravies. Fish sauce. Oyster sauce. Cocktail sauce. Store-bought horseradish. Ketchup. Mustard. Meat flavorings and tenderizers. Bouillon cubes. Hot sauces. Pre-made or packaged marinades. Pre-made or packaged taco seasonings. Relishes. Regular salad dressings. Other foods Salted popcorn and pretzels. The items listed above may not be a complete list of foods and beverages you should avoid. Contact a dietitian for more information. Where to find more information  National Heart, Lung, and Blood Institute: https://wilson-eaton.com/  American Heart Association: www.heart.org  Academy of Nutrition and Dietetics: www.eatright.[place]: www.kidney.org Summary  The DASH eating plan is a healthy eating plan that has been shown to reduce high blood pressure (hypertension). It may also reduce your risk for type 2 diabetes, heart disease, and stroke.  When on the DASH eating plan, aim to eat more fresh fruits and vegetables, whole grains, lean proteins, low-fat dairy, and heart-healthy fats.  With the DASH eating plan, you should limit salt (sodium) intake to 2,300 mg a day. If you have hypertension, you may need to reduce your sodium intake to 1,500 mg a day.  Work with your health care provider or dietitian to adjust your eating plan to your individual calorie needs. This information is not intended to replace advice given to you by your health care provider. Make sure you discuss any questions you have with your health care provider. Document Revised: 10/20/2019 Document Reviewed: 10/20/2019 Elsevier Patient Education  2021 [employer].

## 2021-02-07 NOTE — Progress Notes (Signed)
Chronic Care Management Pharmacy Note  02/07/2021 Name:  FRANCOIS ELK MRN:  [id number] DOB:  1946-03-26   Plan Recommendations:   Daughter reports blood sugar improved with addition of Farxiga. Discussed the benefits of increasing to 10 mg daily if Dr. Tobie Poet approves. Pharmacist will contact patient to instruct him to begin taking 2 of the 5 mg Farxiga daily until increased dosage sent to [facility] for [facility] to fill.   Subjective: AZIAH BROSTROM is an 75 y.o. year old male who is a primary patient of Cox, Kirsten, MD.  The CCM team was consulted for assistance with disease management and care coordination needs.    Engaged with patient by telephone for follow up visit in response to provider referral for pharmacy case management and/or care coordination services.   Consent to Services:  The patient was given information about Chronic Care Management services, agreed to services, and gave verbal consent prior to initiation of services.  Please see initial visit note for detailed documentation.   Patient Care Team: Rochel Brome, MD as PCP - General (Family Medicine) Rosita Fire, MD as Consulting Physician (Nephrology) Burnice Logan, [facility] as Pharmacist (Pharmacist)  Recent office visits: 01/14/2021 - healthy diet encouraged. Microalbumin 80.   Recent consult visits: 01/06/2021 - Pain management - injection.  12/31/2020 - Chest x-ray due to amiodarone. No evidence of thyroid toxicity or liver. Stable EKG. Stable BP at targe heart failure compensated and stable chronic kidney disease. Mild aortic stenosis asymptomatic. Repeate echo 1-2 years.   Hospital visits: None in previous 6 months  Objective:  Lab Results  Component Value Date   CREATININE 1.32 (H) 12/10/2020   BUN 17 12/10/2020   GFRNONAA 52 (L) 12/10/2020   GFRAA 60 12/10/2020   NA 137 12/10/2020   K 4.7 12/10/2020   CALCIUM 10.1 12/10/2020   CO2 28 12/10/2020    Lab Results  Component  Value Date/Time   HGBA1C 9.4 (H) 12/10/2020 01:42 PM   HGBA1C 12.0 (H) 05/31/2020 10:48 AM   MICROALBUR 80 01/14/2021 11:53 AM   MICROALBUR 150 05/31/2020 11:09 AM    Last diabetic Eye exam: No results found for: HMDIABEYEEXA  Last diabetic Foot exam: No results found for: HMDIABFOOTEX   Lab Results  Component Value Date   CHOL 126 12/10/2020   HDL 35 (L) 12/10/2020   LDLCALC 65 12/10/2020   TRIG 148 12/10/2020   CHOLHDL 3.6 12/10/2020    Hepatic Function Latest Ref Rng & Units 12/10/2020 07/18/2020 05/31/2020  Total Protein 6.0 - 8.5 g/dL 6.5 - 6.2  Albumin 3.7 - 4.7 g/dL 3.9 3.7 3.6(L)  AST 0 - 40 IU/L 35 - 33  ALT 0 - 44 IU/L 23 - 22  Alk Phosphatase 44 - 121 IU/L 173(H) - 140(H)  Total Bilirubin 0.0 - 1.2 mg/dL 0.7 - 0.2    No results found for: TSH, FREET4  CBC Latest Ref Rng & Units 12/10/2020 07/18/2020 05/31/2020  WBC 3.4 - 10.8 x10E3/uL 12.1(H) 11.3(H) 10.9(H)  Hemoglobin 13.0 - 17.7 g/dL 13.2 14.2 15.0  Hematocrit 37.5 - 51.0 % 41.4 44.9 46.1  Platelets 150 - 450 x10E3/uL 270 308 211    Lab Results  Component Value Date/Time   VD25OH 29.5 (L) 07/18/2020 02:32 PM   VD25OH 23.5 (L) 03/19/2020 01:45 PM    Clinical ASCVD: No  The ASCVD Risk score Mikey Bussing DC Jr., et al., 2013) failed to calculate for the following reasons:   The valid total cholesterol  range is 130 to 320 mg/dL    Depression screen [facility] 2/9 01/14/2021 01/14/2018 07/15/2017  Decreased Interest 0 0 0  Down, Depressed, Hopeless 1 0 0  PHQ - 2 Score 1 0 0  Altered sleeping 3 - -  Tired, decreased energy 3 - -  Change in appetite 3 - -  Feeling bad or failure about yourself  1 - -  Trouble concentrating 1 - -  Moving slowly or fidgety/restless 2 - -  Suicidal thoughts 0 - -  PHQ-9 Score 14 - -  Difficult doing work/chores Somewhat difficult - -     Social History   Tobacco Use  Smoking Status Former Smoker  . Quit date: 90  . Years since quitting: 29.2  Smokeless Tobacco Never Used   BP  Readings from Last 3 Encounters:  02/03/21 (!) 149/92  01/14/21 128/84  12/31/20 124/60   Pulse Readings from Last 3 Encounters:  02/03/21 71  01/14/21 74  12/31/20 80   Wt Readings from Last 3 Encounters:  02/03/21 (!) 326 lb (147.9 kg)  01/14/21 (!) 328 lb (148.8 kg)  12/31/20 (!) 325 lb (147.4 kg)    Assessment/Interventions: Review of patient past medical history, allergies, medications, health status, including review of consultants reports, laboratory and other test data, was performed as part of comprehensive evaluation and provision of chronic care management services.   SDOH:  (Social Determinants of Health) assessments and interventions performed: Yes   CCM Care Plan  Allergies  Allergen Reactions  . Androgel [Testosterone] Other (See Comments)    Pedal edema   . Biaxin [Clarithromycin] Other (See Comments)    Taste perception alteration  . Duloxetine Other (See Comments)    Hallucinations  . Gabapentin Other (See Comments)    hallucinations  . Ibuprofen Other (See Comments)    GI upset   . Lyrica [Pregabalin] Swelling  . Spironolactone Other (See Comments)    Medications Reviewed Today    Reviewed by Burnice Logan, [facility] (Pharmacist) on 02/07/21 at [place] List Status: <None>  Medication Order Taking? Sig Documenting Provider Last Dose Status Informant  acetaminophen (TYLENOL) 500 MG tablet [id number] Yes Take 1,000 mg by mouth every 6 (six) hours as needed for moderate pain or headache. [provider] Taking Active Family Member  amiodarone (PACERONE) 200 MG tablet [id number] Yes Take 1 tablet (200 mg total) by mouth every evening. Richardo Priest, MD Taking Active   apixaban (ELIQUIS) 5 MG TABS tablet [id number] Yes Take 5 mg by mouth 2 (two) times daily.  [provider] Taking Active Family Member  b complex vitamins tablet [id number] Yes Take 1 tablet by mouth daily. [provider] Taking Active   BD PEN NEEDLE MICRO U/F 32G X 6  MM MISC [id number] Yes SMARTSIG:1 Needle SUB-Q Daily [provider] Taking Active   Budeson-Glycopyrrol-Formoterol (BREZTRI AEROSPHERE) 160-9-4.8 MCG/ACT AERO [id number] Yes Inhale 2 puffs into the lungs 2 (two) times daily. Rochel Brome, MD Taking Active   Catheters (FREEDOM CATH MALE EXT CATHETER) MISC [id number] Yes 1 each by Does not apply route daily. Lillard Anes, MD Taking Active   cholecalciferol (VITAMIN D3) 25 MCG (1000 UT) tablet [id number] Yes Take 1,000 Units by mouth daily.  [provider] Taking Active   CINNAMON PO [id number] Yes Take 700 mg by mouth daily.  [provider] Taking Active Family Member  Continuous Blood Gluc Receiver Benjie Karvonen G6 RECEIVER) DEVI [id number] No E11.21 Use as directed  Patient not  taking: Reported on 02/07/2021   CoxElnita Maxwell, MD Not Taking Active   Continuous Blood Gluc Sensor (DEXCOM G6 SENSOR) MISC [id number] No E11.21 Change sensor every 10 days  Patient not taking: Reported on 02/07/2021   Rochel Brome, MD Not Taking Active   Continuous Blood Gluc Transmit (DEXCOM G6 TRANSMITTER) MISC [id number] No E11.21 Change transmitter every 4 months  Patient not taking: Reported on 02/07/2021   Rochel Brome, MD Not Taking Active   dapagliflozin propanediol (FARXIGA) 5 MG TABS tablet [id number] Yes Take 1 tablet (5 mg total) by mouth daily. Patient willing to begin. Pharmacist faxing order to [facility]. Cox, Kirsten, MD Taking Active   folic acid (FOLVITE) 882 MCG tablet [id number] Yes Take 800 mcg by mouth daily. [provider] Taking Active   HYDROcodone-acetaminophen (NORCO) 10-325 MG tablet [id number] Yes Take 1 tablet by mouth every 4 (four) hours as needed for moderate pain. [provider] Taking Active   insulin degludec (TRESIBA FLEXTOUCH) 100 UNIT/ML FlexTouch Pen [id number] Yes Inject 50 Units into the skin daily. [provider] Taking Active   ipratropium-albuterol (DUONEB) 0.5-2.5 (3) MG/3ML SOLN  [id number] Yes Take 3 mLs by nebulization every 6 (six) hours as needed (shortness of breath).  [provider] Taking Active Family Member  levocetirizine (XYZAL) 5 MG tablet [id number] Yes Take 5 mg by mouth daily as needed for allergies. [provider] Taking Active Family Member  magnesium chloride (SLOW-MAG) 64 MG TBEC SR tablet [id number] Yes Take 1 tablet (64 mg total) by mouth 2 (two) times daily.  Patient taking differently: Take 1 tablet by mouth daily.   Richardo Priest, MD Taking Active   metolazone (ZAROXOLYN) 5 MG tablet [id number] Yes Take 5 mg by mouth once a week. Uses prn if swollen once weekly. [provider] Taking Active   metoprolol tartrate (LOPRESSOR) 50 MG tablet [id number] Yes Take 1 tablet (50 mg total) by mouth in the morning, at noon, and at bedtime. Richardo Priest, MD Taking Active   montelukast (SINGULAIR) 10 MG tablet [id number] Yes Take 1 tablet (10 mg total) by mouth every evening. Cox, Kirsten, MD Taking Active   mupirocin ointment (BACTROBAN) 2 % [id number] Yes Apply 1 application topically 2 (two) times daily. Cox, Kirsten, MD Taking Active   Omeprazole 20 MG TBEC [id number] Yes Take 20 mg by mouth 2 (two) times daily before a meal.  [provider] Taking Active Family Member  potassium chloride SA (K-DUR) 20 MEQ tablet [id number] Yes Take 20 mEq by mouth 2 (two) times daily. [provider] Taking Active   pyridOXINE (VITAMIN B-6) 100 MG tablet [id number] Yes Take 100 mg by mouth daily.  [provider] Taking Active Family Member  rOPINIRole (REQUIP) 0.5 MG tablet [id number] Yes TAKE 1 TABLET BY MOUTH AT BEDTIME Marge Duncans, PA-C Taking Active   rosuvastatin (CRESTOR) 20 MG tablet [id number] Yes Take 1 tablet by mouth once daily Marge Duncans, PA-C Taking Active   Semaglutide, 1 MG/DOSE, (OZEMPIC, 1 MG/DOSE,) 2 MG/1.5ML SOPN [id number] Yes Inject 1 mg into the skin once a week. Cox, Kirsten, MD Taking Active   sertraline  (ZOLOFT) 50 MG tablet [id number] Yes Take 1 tablet by mouth twice daily Cox, Kirsten, MD Taking Active   sulfamethoxazole-trimethoprim (BACTRIM DS) 800-160 MG tablet [id number] Yes Take 1 tablet by mouth 2 (two) times daily. Cox, Kirsten, MD Taking Active   tamsulosin (FLOMAX) 0.4 MG CAPS capsule [id number] Yes TAKE 1 CAPSULE BY MOUTH BEFORE BEDTIME Cox,  Kirsten, MD Taking Active   torsemide (DEMADEX) 20 MG tablet [id number] Yes Take 1 tablet (20 mg total) by mouth 2 (two) times daily. Take an extra tablet (20 mg) in the afternoon on Monday, Wednesday, Friday.  Patient taking differently: Take 40 mg by mouth 2 (two) times daily. 2 in the am and 2 at noon   Richardo Priest, MD Taking Active   VENTOLIN HFA 108 (90 Base) MCG/ACT inhaler [id number] Yes Inhale 2 puffs into the lungs every 4 (four) hours as needed for wheezing or shortness of breath. CoxElnita Maxwell, MD Taking Active           Patient Active Problem List   Diagnosis Date Noted  . Acute sinusitis 10/17/2020  . Orchitis 07/18/2020  . Testicular pain, right 07/15/2020  . Atrial fibrillation, chronic ([place]) 05/31/2020  . Anxiety 05/31/2020  . BMI 45.0-49.9, adult ([place]) 04/11/2020  . Balanitis 04/11/2020  . Diabetes mellitus due to underlying condition, controlled, with diabetic polyneuropathy, with long-term current use of insulin ([place]) 02/23/2020  . Gastroesophageal reflux disease without esophagitis 02/23/2020  . Chronic anticoagulation 06/14/2019  . 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 Persistent atrial fibrillation ([place]) 06/14/2019  . Medication management 04/19/2019  . On amiodarone therapy 03/24/2019  . Polyneuropathy associated with underlying disease ([place]) 10/11/2018  . Hypokalemia 10/04/2018  . PAF (paroxysmal atrial fibrillation) ([place]) 07/01/2018  . Anticoagulated 07/01/2018  . CKD (chronic kidney disease) stage 3, GFR 30-59  ml/min (HCC) 07/01/2018  . COPD (chronic obstructive pulmonary disease) ([place]) 07/01/2018  . Obstructive sleep apnea 07/01/2018  . Hypertensive heart disease 07/01/2018  . Edema of both lower extremities 06/20/2018  . Shortness of breath 06/20/2018  . Obesity, morbid, BMI 40.0-49.9 ([place]) 10/04/2017  . Chronic pain syndrome 08/30/2017  . Restrictive lung disease 06/22/2016  . Generalized edema 06/22/2016  . Restless legs syndrome 03/23/2016  . Presbyopia of both eyes 12/16/2015  . Hyperlipidemia 12/16/2015  . Bilateral pseudophakia 12/16/2015  . Dermatochalasis 12/16/2015  . Chronic obstructive pulmonary disease ([place]) 11/05/2015  . Type 2 diabetes mellitus without complications ([place]) 85/27/7824  . Edema 11/04/2015  . Piriformis syndrome 05/03/2014  . Disorder of bursae and tendons in shoulder region 01/05/2014  . Low back pain 08/17/2013  . Sleep apnea 05/06/2013  . Postlaminectomy syndrome, lumbar region 05/06/2013  . Lumbosacral spondylosis 05/06/2013  . Depressive disorder 05/06/2013  . Chronic pain 05/06/2013  . Benign essential hypertension 05/06/2013    Immunization History  Administered Date(s) Administered  . Fluad Quad(high Dose 65+) 10/11/2020  . Influenza-Unspecified 08/16/2019  . Moderna SARS-COV2 Booster Vaccination 12/10/2020  . Moderna Sars-Covid-2 Vaccination 12/22/2019, 01/19/2020  . Pneumococcal Conjugate-13 07/26/2014  . Pneumococcal Polysaccharide-23 08/19/2012    Conditions to be addressed/monitored:  Hypertension, Hyperlipidemia, Diabetes, Atrial Fibrillation, Heart Failure, GERD, COPD, Chronic Kidney Disease and Restless Leg Syndrome  Care Plan : [place]  Updates made by Burnice Logan, RPH since 02/07/2021 12:00 AM    Problem: dm, afib, chf, hld   Priority: High  Onset Date: 02/07/2021    Long-Range Goal: Disease Management   Start Date: 02/07/2021  Expected End Date: 02/07/2022  This Visit's Progress: On track  Priority: High   Note:    Current Barriers:  . Unable to achieve control of diabetes   Pharmacist Clinical Goal(s):  [name] Over the next 90 days, patient will verbalize ability to afford treatment regimen . achieve adherence  to monitoring guidelines and medication adherence to achieve therapeutic efficacy . achieve control of diabetes as evidenced by a1c through collaboration with PharmD and provider.   Interventions: . 1:1 collaboration with Rochel Brome, MD regarding development and update of comprehensive plan of care as evidenced by provider attestation and co-signature . Inter-disciplinary care team collaboration (see longitudinal plan of care) . Comprehensive medication review performed; medication list updated in electronic medical record  Hyperlipidemia: (LDL goal < 70) -Controlled -Current treatment: . Rosuvastatin 20 mg daily  -Medications previously tried: atorvastatin  -Current dietary patterns: daughter tries to get some healthy options when she cooks for them.  -Current exercise habits: minimal due to mobility and back pain  -Educated on Cholesterol goals;  Benefits of statin for ASCVD risk reduction; Importance of limiting foods high in cholesterol; -Counseled on diet and exercise extensively Recommended to continue current medication  Diabetes (A1c goal <7%) -Uncontrolled -Current medications: . Farxiga 5 mg daily - rcommend increase to 10 mg daily  . Tresiba 50 units daily . Ozempic 1 mg weekly  -Medications previously tried:  glipizide, metformin, Soliqua -Current home glucose readings . fasting glucose: <200 mg/dL . post prandial glucose: not reproted -Denies hypoglycemic/hyperglycemic symptoms -Current meal patterns:  . Patient sneaks cookies and snacks. Daughter tries to prepare a balanced meal when cooking for patient.  -Current exercise: minimal  -Educated on A1c and blood sugar goals; Complications of diabetes including kidney damage, retinal damage, and cardiovascular  disease; Benefits of routine self-monitoring of blood sugar; -Counseled to check feet daily and get yearly eye exams -Counseled on diet and exercise extensively Recommended increase Farxiga 10 mg daily if Dr. Tobie Poet approves.  Collaborated with Dr. Alyse Low nurse to send order for Dexcom to [facility] to evaluate cost.   Atrial Fibrillation (Goal: prevent stroke and major bleeding) -Controlled -CHADSVASC: 5  -Current treatment: . Rate/Rhythm control: amiodarone 200 mg daily in the evening and metoprolol 50 mg morning, noon and bedtime  . Anticoagulation: Eliquis 5 mg bid  -Medications previously tried: none reported -Home BP and HR readings: fluctuates  -Counseled on increased risk of stroke due to Afib and benefits of anticoagulation for stroke prevention; importance of adherence to anticoagulant exactly as prescribed; Pharmacist will coordinate patient assistance application once 3% spent on medications.   -Counseled on diet and exercise extensively Recommended to continue current medication Assessed patient finances. for eligibilty of renewing Eliquis patient assistance for 2022  Heart Failure (Goal: manage symptoms and prevent exacerbations) -Controlled -Last ejection fraction: 60-65% (Date: 10/21) -HF type: Diastolic -Current treatment: . torsemide 40 mg bid  . Metoprolol tartrate 50 mg tid . Metolazone 5 mg once weekly if needed -Medications previously tried: furosemide  -Current home BP/HR readings: ~130/80 -Current dietary habits: daughter tries to provide healthy options  -Current exercise habits: limited by mobility and pain  -Educated on Benefits of medications for managing symptoms and prolonging life Importance of blood pressure control -Counseled on diet and exercise extensively Recommended to continue current medication  COPD (Goal: control symptoms and prevent exacerbations) -Controlled -Current treatment  . Breztri 2 puffs bid  . Ipratropium 3 mls every 6 hours prn   . Montelukast 10 mg daily  . Ventolin 2 puffs every 4 hours prn wheezing or shortness of breath -Medications previously tried: trelegy  -Patient reports consistent use of maintenance inhaler -Frequency of rescue inhaler use: weekly  -Counseled on [place] inhaler technique; Benefits of consistent maintenance inhaler use -Recommended to continue current medication   Patient Goals/Self-Care Activities .  Over the next 90 days, patient will:  - take medications as prescribed focus on medication adherence by using pill box check glucose twice daily, document, and provide at future appointments check blood pressure daily, document, and provide at future appointments  Follow Up Plan: Telephone follow up appointment with care management team member scheduled for: 05/09/2021      Medication Assistance: Pilar Grammes, Ozempic, Tresibaobtained through [facility] and NovoNordisk  medication assistance program.  Enrollment ends 11/29/2021  Patient will reach out to pharmacist once 3% out of pocket is met to apply for Eliquis this year.   Patient's preferred pharmacy is:  [place], [place] - [address] [place] [address] Phone: [phone] Fax: [phone]  [place], [place] - [address] [place] [address] Phone: [phone] Fax: [phone]  [place], [place] [address] [place] [address] Phone: [phone] Fax: [phone], [place] (New Address) - [place], [place] AT Previously: [place] [place], [place] [address] [place] [address] Phone: [phone] Fax: [phone]  Uses pill box? Yes Pt endorses good compliance  We discussed: Benefits of medication synchronization, packaging and delivery as  well as enhanced pharmacist oversight with [employer]. Patient decided to: Continue current medication management strategy  Care Plan and Follow Up Patient Decision:  Patient agrees to Care Plan and Follow-up.  Plan: Telephone follow up appointment with care management team member scheduled for:  05/09/2021

## 2021-02-10 ENCOUNTER — Other Ambulatory Visit: Payer: Self-pay | Admitting: Family Medicine

## 2021-02-10 MED ORDER — DAPAGLIFLOZIN PROPANEDIOL 10 MG PO TABS: 10.0000 mg | ORAL_TABLET | Freq: Every day | ORAL | 3 refills | Status: DC

## 2021-02-17 DIAGNOSIS — M533 Sacrococcygeal disorders, not elsewhere classified: Secondary | ICD-10-CM | POA: Diagnosis not present

## 2021-02-17 DIAGNOSIS — M961 Postlaminectomy syndrome, not elsewhere classified: Secondary | ICD-10-CM | POA: Diagnosis not present

## 2021-02-17 DIAGNOSIS — Z79899 Other long term (current) drug therapy: Secondary | ICD-10-CM | POA: Diagnosis not present

## 2021-02-17 DIAGNOSIS — G894 Chronic pain syndrome: Secondary | ICD-10-CM | POA: Diagnosis not present

## 2021-02-23 ENCOUNTER — Encounter: Payer: Self-pay | Admitting: Family Medicine

## 2021-02-28 ENCOUNTER — Telehealth: Payer: Self-pay

## 2021-02-28 MED ORDER — DAPAGLIFLOZIN PROPANEDIOL 10 MG PO TABS: 10.0000 mg | ORAL_TABLET | Freq: Every day | ORAL | 3 refills | Status: DC

## 2021-03-04 ENCOUNTER — Other Ambulatory Visit: Payer: Self-pay | Admitting: Physician Assistant

## 2021-03-04 ENCOUNTER — Telehealth: Payer: Self-pay

## 2021-03-04 NOTE — Progress Notes (Signed)
Chronic Care Management Pharmacy Assistant   Name: Shawn Sullivan  MRN: [id number] DOB: 1946/03/06  Reason for Encounter: Medication Review for Eliquis PAP    Medications: Outpatient Encounter Medications as of 03/04/2021  Medication Sig  . acetaminophen (TYLENOL) 500 MG tablet Take 1,000 mg by mouth every 6 (six) hours as needed for moderate pain or headache.  [name] amiodarone (PACERONE) 200 MG tablet Take 1 tablet (200 mg total) by mouth every evening.  [name] apixaban (ELIQUIS) 5 MG TABS tablet Take 5 mg by mouth 2 (two) times daily.   [name] b complex vitamins tablet Take 1 tablet by mouth daily.  . BD PEN NEEDLE MICRO U/F 32G X 6 MM MISC SMARTSIG:1 Needle SUB-Q Daily  . Budeson-Glycopyrrol-Formoterol (BREZTRI AEROSPHERE) 160-9-4.8 MCG/ACT AERO Inhale 2 puffs into the lungs 2 (two) times daily.  . Catheters (FREEDOM CATH MALE EXT CATHETER) MISC 1 each by Does not apply route daily.  . cholecalciferol (VITAMIN D3) 25 MCG (1000 UT) tablet Take 1,000 Units by mouth daily.   [name] CINNAMON PO Take 700 mg by mouth daily.   . Continuous Blood Gluc Receiver ([place]) DEVI E11.21 Use as directed (Patient not taking: Reported on 02/07/2021)  . Continuous Blood Gluc Sensor (DEXCOM G6 SENSOR) MISC E11.21 Change sensor every 10 days (Patient not taking: Reported on 02/07/2021)  . Continuous Blood Gluc Transmit (DEXCOM G6 TRANSMITTER) MISC E11.21 Change transmitter every 4 months (Patient not taking: Reported on 02/07/2021)  . dapagliflozin propanediol (FARXIGA) 10 MG TABS tablet Take 1 tablet (10 mg total) by mouth daily before breakfast.  . folic acid (FOLVITE) 825 MCG tablet Take 800 mcg by mouth daily.  [name] HYDROcodone-acetaminophen (NORCO) 10-325 MG tablet Take 1 tablet by mouth every 4 (four) hours as needed for moderate pain.  [name] insulin degludec (TRESIBA FLEXTOUCH) 100 UNIT/ML FlexTouch Pen Inject 50 Units into the skin daily.  [name] ipratropium-albuterol (DUONEB) 0.5-2.5 (3) MG/3ML SOLN Take 3 mLs by  nebulization every 6 (six) hours as needed (shortness of breath).   [name] levocetirizine (XYZAL) 5 MG tablet Take 5 mg by mouth daily as needed for allergies.  . magnesium chloride (SLOW-MAG) 64 MG TBEC SR tablet Take 1 tablet (64 mg total) by mouth 2 (two) times daily. (Patient taking differently: Take 1 tablet by mouth daily.)  . metolazone (ZAROXOLYN) 5 MG tablet Take 5 mg by mouth once a week. Uses prn if swollen once weekly.  . metoprolol tartrate (LOPRESSOR) 50 MG tablet Take 1 tablet (50 mg total) by mouth in the morning, at noon, and at bedtime.  . montelukast (SINGULAIR) 10 MG tablet Take 1 tablet (10 mg total) by mouth every evening.  . mupirocin ointment (BACTROBAN) 2 % Apply 1 application topically 2 (two) times daily.  . Omeprazole 20 MG TBEC Take 20 mg by mouth 2 (two) times daily before a meal.   . potassium chloride SA (K-DUR) 20 MEQ tablet Take 20 mEq by mouth 2 (two) times daily.  [name] pyridOXINE (VITAMIN B-6) 100 MG tablet Take 100 mg by mouth daily.   [name] rOPINIRole (REQUIP) 0.5 MG tablet TAKE 1 TABLET BY MOUTH AT BEDTIME  . rosuvastatin (CRESTOR) 20 MG tablet Take 1 tablet by mouth once daily  . Semaglutide, 1 MG/DOSE, (OZEMPIC, 1 MG/DOSE,) 2 MG/1.5ML SOPN Inject 1 mg into the skin once a week.  . sertraline (ZOLOFT) 50 MG tablet Take 1 tablet by mouth twice daily  . sulfamethoxazole-trimethoprim (BACTRIM DS) 800-160 MG tablet Take 1 tablet by  mouth 2 (two) times daily.  . tamsulosin (FLOMAX) 0.4 MG CAPS capsule TAKE 1 CAPSULE BY MOUTH BEFORE BEDTIME  . torsemide (DEMADEX) 20 MG tablet Take 1 tablet (20 mg total) by mouth 2 (two) times daily. Take an extra tablet (20 mg) in the afternoon on Monday, Wednesday, Friday. (Patient taking differently: Take 40 mg by mouth 2 (two) times daily. 2 in the am and 2 at noon)  . VENTOLIN HFA 108 (90 Base) MCG/ACT inhaler Inhale 2 puffs into the lungs every 4 (four) hours as needed for wheezing or shortness of breath.   No facility-administered  encounter medications on file as of 03/04/2021.    Donette Larry, CPP, notified the patients daughter of what documents were needed to complete the patient assistance forms for Eliquis.  We will need proof of income and out of pocket amounts paid.  I have left a message for Jenny Reichmann to call me about his application.  I have filled out the PAP form and submitted it to Donette Larry, CPP for signatures and approval.  Clarita Leber, [place] Pharmacist Assistant [phone]

## 2021-03-13 NOTE — Telephone Encounter (Signed)
Rx sent 

## 2021-03-14 NOTE — Telephone Encounter (Cosign Needed)
  Chronic Care Management   Note  03/14/2021 Name: Shawn Sullivan MRN: [id number] DOB: 08/17/1946   Patient's daughter called stating that he is almost out of [place] and has $100 more dollars to spend on medication before eligible for Eliquis patient assistance. Pharmacist coordinated samples for patient for Eliquis and Farxiga 10 mg to pick up in office. Pharmacist called [place] and [place] to track shipment. [name] should arrive 03/18/2021.   Provided patient with [facility] shipping # [id number].   Sherre Poot, PharmD, [facility] Clinical Pharmacist [facility] [phone] (office) [phone] (mobile)

## 2021-03-17 ENCOUNTER — Telehealth: Payer: Self-pay

## 2021-03-17 NOTE — Progress Notes (Signed)
Chronic Care Management Pharmacy Assistant   Name: Shawn Sullivan  MRN: [id number] DOB: [birth year]  Reason for Encounter: Disease State for diabetes    Recent office visits:  03/04/21-Sara Owens Shark, CPP-coordinated samples for patient for Eliquis and Farxiga 10 mg to pick up in office. Pharmacist [place] and [place] to [place]. [name] should arrive 03/18/2021.   02/17/21-Jane Cecil Cobbs PA-C-pain management   02/07/21-Sara Owens Shark, [place],   Recent consult visits:  none  Hospital visits:  None in previous 6 months  Medications: Outpatient Encounter Medications as of 03/17/2021  Medication Sig  . acetaminophen (TYLENOL) 500 MG tablet Take 1,000 mg by mouth every 6 (six) hours as needed for moderate pain or headache.  [name] amiodarone (PACERONE) 200 MG tablet Take 1 tablet (200 mg total) by mouth every evening.  [name] apixaban (ELIQUIS) 5 MG TABS tablet Take 5 mg by mouth 2 (two) times daily.   [name] b complex vitamins tablet Take 1 tablet by mouth daily.  . BD PEN NEEDLE MICRO U/F 32G X 6 MM MISC SMARTSIG:1 Needle SUB-Q Daily  . Budeson-Glycopyrrol-Formoterol (BREZTRI AEROSPHERE) 160-9-4.8 MCG/ACT AERO Inhale 2 puffs into the lungs 2 (two) times daily.  . Catheters (FREEDOM CATH MALE EXT CATHETER) MISC 1 each by Does not apply route daily.  . cholecalciferol (VITAMIN D3) 25 MCG (1000 UT) tablet Take 1,000 Units by mouth daily.   [name] CINNAMON PO Take 700 mg by mouth daily.   . Continuous Blood Gluc Receiver ([place]) DEVI E11.21 Use as directed (Patient not taking: Reported on 02/07/2021)  . Continuous Blood Gluc Sensor (DEXCOM G6 SENSOR) MISC E11.21 Change sensor every 10 days (Patient not taking: Reported on 02/07/2021)  . Continuous Blood Gluc Transmit (DEXCOM G6 TRANSMITTER) MISC E11.21 Change transmitter every 4 months (Patient not taking: Reported on 02/07/2021)  . dapagliflozin propanediol (FARXIGA) 10 MG TABS tablet Take 1 tablet (10 mg total) by mouth daily before breakfast.  .  folic acid (FOLVITE) 287 MCG tablet Take 800 mcg by mouth daily.  [name] HYDROcodone-acetaminophen (NORCO) 10-325 MG tablet Take 1 tablet by mouth every 4 (four) hours as needed for moderate pain.  [name] insulin degludec (TRESIBA FLEXTOUCH) 100 UNIT/ML FlexTouch Pen Inject 50 Units into the skin daily.  [name] ipratropium-albuterol (DUONEB) 0.5-2.5 (3) MG/3ML SOLN Take 3 mLs by nebulization every 6 (six) hours as needed (shortness of breath).   [name] levocetirizine (XYZAL) 5 MG tablet Take 5 mg by mouth daily as needed for allergies.  . magnesium chloride (SLOW-MAG) 64 MG TBEC SR tablet Take 1 tablet (64 mg total) by mouth 2 (two) times daily. (Patient taking differently: Take 1 tablet by mouth daily.)  . metolazone (ZAROXOLYN) 5 MG tablet Take 5 mg by mouth once a week. Uses prn if swollen once weekly.  . metoprolol tartrate (LOPRESSOR) 50 MG tablet Take 1 tablet (50 mg total) by mouth in the morning, at noon, and at bedtime.  . montelukast (SINGULAIR) 10 MG tablet Take 1 tablet (10 mg total) by mouth every evening.  . mupirocin ointment (BACTROBAN) 2 % Apply 1 application topically 2 (two) times daily.  . Omeprazole 20 MG TBEC Take 20 mg by mouth 2 (two) times daily before a meal.   . potassium chloride SA (K-DUR) 20 MEQ tablet Take 20 mEq by mouth 2 (two) times daily.  [name] pyridOXINE (VITAMIN B-6) 100 MG tablet Take 100 mg by mouth daily.   [name] rOPINIRole (REQUIP) 0.5 MG tablet TAKE 1 TABLET BY MOUTH AT BEDTIME  .  rosuvastatin (CRESTOR) 20 MG tablet Take 1 tablet by mouth once daily  . Semaglutide, 1 MG/DOSE, (OZEMPIC, 1 MG/DOSE,) 2 MG/1.5ML SOPN Inject 1 mg into the skin once a week.  . sertraline (ZOLOFT) 50 MG tablet Take 1 tablet by mouth twice daily  . sulfamethoxazole-trimethoprim (BACTRIM DS) 800-160 MG tablet Take 1 tablet by mouth 2 (two) times daily.  . tamsulosin (FLOMAX) 0.4 MG CAPS capsule TAKE 1 CAPSULE BY MOUTH BEFORE BEDTIME  . torsemide (DEMADEX) 20 MG tablet Take 1 tablet (20 mg total) by mouth 2  (two) times daily. Take an extra tablet (20 mg) in the afternoon on Monday, Wednesday, Friday. (Patient taking differently: Take 40 mg by mouth 2 (two) times daily. 2 in the am and 2 at noon)  . VENTOLIN HFA 108 (90 Base) MCG/ACT inhaler Inhale 2 puffs into the lungs every 4 (four) hours as needed for wheezing or shortness of breath.   No facility-administered encounter medications on file as of 03/17/2021.    Recent Relevant Labs: Lab Results  Component Value Date/Time   HGBA1C 9.4 (H) 12/10/2020 01:42 PM   HGBA1C 12.0 (H) 05/31/2020 10:48 AM   MICROALBUR 80 01/14/2021 11:53 AM   MICROALBUR 150 05/31/2020 11:09 AM    Kidney Function Lab Results  Component Value Date/Time   CREATININE 1.32 (H) 12/10/2020 01:42 PM   CREATININE 1.24 07/18/2020 02:32 PM   GFRNONAA 52 (L) 12/10/2020 01:42 PM   GFRAA 60 12/10/2020 01:42 PM    . Current antihyperglycemic regimen:   Farxiga 5 mg daily - recommend increase to 10 mg daily   Tresiba 50 units daily  Ozempic 1 mg weekly   . What recent interventions/DTPs have been made to improve glycemic control:   Patient stated he had been out of his medication until he was able to get some samples while waiting on his shipment to come in.  He is now back on track with taking his medication daily.   Patient stated he is watching his diet.   . Have there been any recent hospitalizations or ED visits since last visit with CPP? No   . Patient denies hypoglycemic symptoms, including None   . Patient reports hyperglycemic symptoms, including just feels bad  . How often are you checking your blood sugar? Patient stated he checks it when he feels bad, not checking on a regular basis.   . What are your blood sugars ranging?  Patient sated they range between 160-165, yesterday morning it was 321. o Fasting: None o Before meals: None o After meals: None o Bedtime: None  . During the week, how often does your blood glucose drop below 70? Never   . Are  you checking your feet daily/regularly? Patient said he is checking his feet and they are doing fine  Adherence Review: Is the patient currently on a STATIN medication? No Is the patient currently on ACE/ARB medication? Yes Does the patient have >5 day gap between last estimated fill dates? No   Clarita Leber, [place] Pharmacist Assistant [phone]

## 2021-03-20 ENCOUNTER — Other Ambulatory Visit: Payer: Self-pay | Admitting: Family Medicine

## 2021-03-28 ENCOUNTER — Ambulatory Visit (INDEPENDENT_AMBULATORY_CARE_PROVIDER_SITE_OTHER): Payer: Medicare HMO

## 2021-03-28 DIAGNOSIS — N1831 Chronic kidney disease, stage 3a: Secondary | ICD-10-CM | POA: Diagnosis not present

## 2021-03-28 DIAGNOSIS — I130 Hypertensive heart and chronic kidney disease with heart failure and stage 1 through stage 4 chronic kidney disease, or unspecified chronic kidney disease: Secondary | ICD-10-CM

## 2021-03-28 DIAGNOSIS — I482 Chronic atrial fibrillation, unspecified: Secondary | ICD-10-CM | POA: Diagnosis not present

## 2021-03-28 DIAGNOSIS — J449 Chronic obstructive pulmonary disease, unspecified: Secondary | ICD-10-CM

## 2021-03-28 DIAGNOSIS — I5032 Chronic diastolic (congestive) heart failure: Secondary | ICD-10-CM | POA: Diagnosis not present

## 2021-03-28 DIAGNOSIS — E1121 Type 2 diabetes mellitus with diabetic nephropathy: Secondary | ICD-10-CM | POA: Diagnosis not present

## 2021-03-28 NOTE — Patient Instructions (Addendum)
Visit Information  Goals Addressed            This Visit's Progress   . Learn More About My Health   On track    Timeframe:  Long-Range Goal Priority:  High Start Date:   02/07/2021                          Expected End Date:      02/07/2022                  Follow Up Date 05/09/2021    - tell my story and reason for my visit - make a list of questions - repeat what I heard to make sure I understand - bring a list of my medicines to the visit - speak up when I don't understand    Why is this important?    The best way to learn about your health and care is by talking to the doctor and nurse.   They will answer your questions and give you information in the way that you like best.    Notes:     [name] Manage My Medicine   On track    Timeframe:  Long-Range Goal Priority:  High Start Date:         02/07/2021                    Expected End Date:        02/07/2022               Follow Up Date 05/09/2021    - call for medicine refill 2 or 3 days before it runs out - call if I am sick and can't take my medicine - keep a list of all the medicines I take; vitamins and herbals too - use a pillbox to sort medicine    Why is this important?   . These steps will help you keep on track with your medicines.   Notes:     . Track and Manage Symptoms-Heart Failure   On track    Timeframe:  Long-Range Goal Priority:  High Start Date:          02/07/2021                   Expected End Date:   02/07/2022                  Follow Up Date 05/09/2021    - eat more whole grains, fruits and vegetables, lean meats and healthy fats - track symptoms and what helps feel better or worse    Why is this important?    You will be able to handle your symptoms better if you keep track of them.   Making some simple changes to your lifestyle will help.   Eating healthy is one thing you can do to take good care of yourself.    Notes:       Patient Care Plan: CCM Pharmacy Care Plan     Problem Identified: dm, afib, chf, hld   Priority: High  Onset Date: 02/07/2021    Long-Range Goal: Disease Management   Start Date: 02/07/2021  Expected End Date: 02/07/2022  Recent Progress: On track  Priority: High  Note:    Current Barriers:  . Unable to achieve control of diabetes   Pharmacist Clinical Goal(s):  [name] Over the next 90 days, patient will verbalize ability to  afford treatment regimen . achieve adherence to monitoring guidelines and medication adherence to achieve therapeutic efficacy . achieve control of diabetes as evidenced by a1c through collaboration with PharmD and provider.   Interventions: . 1:1 collaboration with Rochel Brome, MD regarding development and update of comprehensive plan of care as evidenced by provider attestation and co-signature . Inter-disciplinary care team collaboration (see longitudinal plan of care) . Comprehensive medication review performed; medication list updated in electronic medical record  Hyperlipidemia: (LDL goal < 70) -Controlled -Current treatment: . Rosuvastatin 20 mg daily  -Medications previously tried: atorvastatin  -Current dietary patterns: daughter tries to get some healthy options when she cooks for them.  -Current exercise habits: minimal due to mobility and back pain  -Educated on Cholesterol goals;  Benefits of statin for ASCVD risk reduction; Importance of limiting foods high in cholesterol; -Counseled on diet and exercise extensively Recommended to continue current medication  Diabetes (A1c goal <7%) -Uncontrolled -Current medications: . Farxiga 10 mg daily  . Tresiba 50 units daily . Ozempic 1 mg weekly  -Medications previously tried:  glipizide, metformin, Soliqua -Current home glucose readings . fasting glucose: 160 mg/dL . post prandial glucose: not reproted -Denies hypoglycemic/hyperglycemic symptoms -Current meal patterns:  . Patient sneaks cookies and snacks. Daughter tries to prepare a  balanced meal when cooking for patient.  -Current exercise: minimal  -Educated on A1c and blood sugar goals; Complications of diabetes including kidney damage, retinal damage, and cardiovascular disease; Benefits of routine self-monitoring of blood sugar; -Counseled to check feet daily and get yearly eye exams -Counseled on diet and exercise extensively Patient tolerating Farxiga 10 mg daily and arrived through patient assistance.   Atrial Fibrillation (Goal: prevent stroke and major bleeding) -Controlled -CHADSVASC: 5  -Current treatment: . Rate/Rhythm control: amiodarone 200 mg daily in the evening and metoprolol 50 mg morning, noon and bedtime  . Anticoagulation: Eliquis 5 mg bid  -Medications previously tried: none reported -Home BP and HR readings: fluctuates  -Counseled on increased risk of stroke due to Afib and benefits of anticoagulation for stroke prevention; importance of adherence to anticoagulant exactly as prescribed; Pharmacist will coordinate patient assistance application once 3% spent on medications.   -Counseled on diet and exercise extensively Recommended to continue current medication Assessed patient finances. for eligibilty of renewing Eliquis patient assistance for 2022. Waiting for out of pocket cost report to apply.   Heart Failure (Goal: manage symptoms and prevent exacerbations) -Controlled -Last ejection fraction: 60-65% (Date: 10/21) -HF type: Diastolic -Current treatment: . torsemide 40 mg bid  . Metoprolol tartrate 50 mg tid . Metolazone 5 mg once weekly if needed -Medications previously tried: furosemide  -Current home BP/HR readings: ~130/80 -Current dietary habits: daughter tries to provide healthy options  -Current exercise habits: limited by mobility and pain  -Educated on Benefits of medications for managing symptoms and prolonging life Importance of blood pressure control -Counseled on diet and exercise extensively Recommended to continue  current medication  COPD (Goal: control symptoms and prevent exacerbations) -Controlled -Current treatment  . Breztri 2 puffs bid  . Ipratropium 3 mls every 6 hours prn  . Montelukast 10 mg daily  . Ventolin 2 puffs every 4 hours prn wheezing or shortness of breath -Medications previously tried: trelegy  -Patient reports consistent use of maintenance inhaler -Frequency of rescue inhaler use: weekly  -Counseled on Proper inhaler technique; Benefits of consistent maintenance inhaler use -Recommended to continue current medication   Patient Goals/Self-Care Activities . Over the next 90 days, patient  will:  - take medications as prescribed focus on medication adherence by using pill box check glucose twice daily, document, and provide at future appointments check blood pressure daily, document, and provide at future appointments  Follow Up Plan: Telephone follow up appointment with care management team member scheduled for: 05/09/2021      The patient verbalized understanding of instructions, educational materials, and care plan provided today and declined offer to receive copy of patient instructions, educational materials, and care plan.  Telephone follow up appointment with pharmacy team member scheduled for: 04/2021  Burnice Logan, [facility]  Chronic Kidney Disease, Adult Chronic kidney disease is when lasting damage happens to the kidneys slowly over a long time. The kidneys help to:  Make pee (urine).  Make hormones.  Keep the right amount of fluids and chemicals in the body. Most often, this disease does not go away. You must take steps to help keep the kidney damage from getting worse. If steps are not taken, the kidneys might stop working forever. What are the causes?  Diabetes.  High blood pressure.  Diseases that affect the heart and blood vessels.  Other kidney diseases.  Diseases of the body's disease-fighting system.  A problem with the flow of  pee.  Infections of the organs that make pee, store it, and take it out of the body.  Swelling or irritation of your blood vessels. What increases the risk?  Getting older.  Having someone in your family who has kidney disease or kidney failure.  Having a disease caused by genes.  Taking medicines often that harm the kidneys.  Being near or having contact with harmful substances.  Being very overweight.  Using tobacco now or in the past. What are the signs or symptoms?  Feeling very tired.  Having a swollen face, legs, ankles, or feet.  Feeling like you may vomit or vomiting.  Not feeling hungry.  Being confused or not able to focus.  Twitches and cramps in the leg muscles or other muscles.  Dry, itchy skin.  A taste of metal in your mouth.  Making less pee, or making more pee.  Shortness of breath.  Trouble sleeping. You may also become anemic or get weak bones. Anemic means there is not enough red blood cells or hemoglobin in your blood. You may get symptoms slowly. You may not notice them until the kidney damage gets very bad. How is this treated? Often, there is no cure for this disease. Treatment can help with symptoms and help keep the disease from getting worse. You may need to:  Avoid alcohol.  Avoid foods that are high in salt, potassium, phosphorous, and protein.  Take medicines for symptoms and to help control other conditions.  Have dialysis. This treatment gets harmful waste out of your body.  Treat other problems that cause your kidney disease or make it worse. Follow these instructions at home: Medicines  Take over-the-counter and prescription medicines only as told by your doctor.  Do not take any new medicines, vitamins, or supplements unless your doctor says it is okay. Lifestyle  Do not smoke or use any products that contain nicotine or tobacco. If you need help quitting, ask your doctor.  If you drink alcohol: ? Limit how much  you use to:  0-1 drink a day for women who are not pregnant.  0-2 drinks a day for men. ? Know how much alcohol is in your drink. In the [place] one drink equals one 12 oz bottle  of beer (355 mL), one 5 oz glass of wine (148 mL), or one 1 oz glass of hard liquor (44 mL).  Stay at a healthy weight. If you need help losing weight, ask your doctor.   General instructions  Follow instructions from your doctor about what you cannot eat or drink.  Track your blood pressure at home. Tell your doctor about any changes.  If you have diabetes, track your blood sugar.  Exercise at least 30 minutes a day, 5 days a week.  Keep your shots (vaccinations) up to date.  Keep all follow-up visits.   Where to find more information  American Association of Kidney Patients: BombTimer.gl  National Kidney Foundation: www.kidney.Exline: https://mathis.com/  Life Options: www.lifeoptions.org  Kidney School: www.kidneyschool.org Contact a doctor if:  Your symptoms get worse.  You get new symptoms. Get help right away if:  You get symptoms of end-stage kidney disease. These include: ? Headaches. ? Losing feeling in your hands or feet. ? Easy bruising. ? Having hiccups often. ? Chest pain. ? Shortness of breath. ? Lack of menstrual periods, in women.  You have a fever.  You make less pee than normal.  You have pain or you bleed when you pee or poop. These symptoms may be an emergency. Get help right away. Call your local emergency services (911 in the [place]  Do not wait to see if the symptoms will go away.  Do not drive yourself to the hospital. Summary  Chronic kidney disease is when lasting damage happens to the kidneys slowly over a long time.  Causes of this disease include diabetes and high blood pressure.  Often, there is no cure for this disease. Treatment can help symptoms and help keep the disease from getting worse.  Treatment may involve lifestyle  changes, medicines, and dialysis. This information is not intended to replace advice given to you by your health care provider. Make sure you discuss any questions you have with your health care provider. Document Revised: 02/21/2020 Document Reviewed: 02/21/2020 Elsevier Patient Education  [place].

## 2021-03-28 NOTE — Progress Notes (Signed)
Chronic Care Management Pharmacy Note  03/28/2021 Name:  Shawn Sullivan MRN:  [id number] DOB:  [birth year]   Plan Recommendations:   Blood sugar remains above goal. Recommend considering Ozempic 2 mg daily once available. Pharmacist can coordiante supply through [facility] patient assistance.   Patient reports breathing is well controlled currently.   Patient's daughter will provide medication cost report to apply for Eliquis.   Subjective: Shawn Sullivan is an 75 y.o. year old male who is a primary patient of Cox, Kirsten, MD.  The CCM team was consulted for assistance with disease management and care coordination needs.    Engaged with patient by telephone for follow up visit in response to provider referral for pharmacy case management and/or care coordination services.   Consent to Services:  The patient was given information about Chronic Care Management services, agreed to services, and gave verbal consent prior to initiation of services.  Please see initial visit note for detailed documentation.   Patient Care Team: Rochel Brome, MD as PCP - General (Family Medicine) Rosita Fire, MD as Consulting Physician (Nephrology) Burnice Logan, [facility] as Pharmacist (Pharmacist)  Recent office visits: 01/14/2021 - healthy diet encouraged. Microalbumin 80.   Recent consult visits: 01/06/2021 - Pain management - injection.  12/31/2020 - Chest x-ray due to amiodarone. No evidence of thyroid toxicity or liver. Stable EKG. Stable BP at targe heart failure compensated and stable chronic kidney disease. Mild aortic stenosis asymptomatic. Repeate echo 1-2 years.   Hospital visits: None in previous 6 months  Objective:  Lab Results  Component Value Date   CREATININE 1.32 (H) 12/10/2020   BUN 17 12/10/2020   GFRNONAA 52 (L) 12/10/2020   GFRAA 60 12/10/2020   NA 137 12/10/2020   K 4.7 12/10/2020   CALCIUM 10.1 12/10/2020   CO2 28 12/10/2020    Lab Results  Component  Value Date/Time   HGBA1C 9.4 (H) 12/10/2020 01:42 PM   HGBA1C 12.0 (H) 05/31/2020 10:48 AM   MICROALBUR 80 01/14/2021 11:53 AM   MICROALBUR 150 05/31/2020 11:09 AM    Last diabetic Eye exam: No results found for: HMDIABEYEEXA  Last diabetic Foot exam: No results found for: HMDIABFOOTEX   Lab Results  Component Value Date   CHOL 126 12/10/2020   HDL 35 (L) 12/10/2020   LDLCALC 65 12/10/2020   TRIG 148 12/10/2020   CHOLHDL 3.6 12/10/2020    Hepatic Function Latest Ref Rng & Units 12/10/2020 07/18/2020 05/31/2020  Total Protein 6.0 - 8.5 g/dL 6.5 - 6.2  Albumin 3.7 - 4.7 g/dL 3.9 3.7 3.6(L)  AST 0 - 40 IU/L 35 - 33  ALT 0 - 44 IU/L 23 - 22  Alk Phosphatase 44 - 121 IU/L 173(H) - 140(H)  Total Bilirubin 0.0 - 1.2 mg/dL 0.7 - 0.2    No results found for: TSH, FREET4  CBC Latest Ref Rng & Units 12/10/2020 07/18/2020 05/31/2020  WBC 3.4 - 10.8 x10E3/uL 12.1(H) 11.3(H) 10.9(H)  Hemoglobin 13.0 - 17.7 g/dL 13.2 14.2 15.0  Hematocrit 37.5 - 51.0 % 41.4 44.9 46.1  Platelets 150 - 450 x10E3/uL 270 308 211    Lab Results  Component Value Date/Time   VD25OH 29.5 (L) 07/18/2020 02:32 PM   VD25OH 23.5 (L) 03/19/2020 01:45 PM    Clinical ASCVD: No  The ASCVD Risk score Mikey Bussing DC Jr., et al., 2013) failed to calculate for the following reasons:   The valid total cholesterol range is 130 to 320 mg/dL  Depression screen [facility] 2/9 01/14/2021 01/14/2018 07/15/2017  Decreased Interest 0 0 0  Down, Depressed, Hopeless 1 0 0  PHQ - 2 Score 1 0 0  Altered sleeping 3 - -  Tired, decreased energy 3 - -  Change in appetite 3 - -  Feeling bad or failure about yourself  1 - -  Trouble concentrating 1 - -  Moving slowly or fidgety/restless 2 - -  Suicidal thoughts 0 - -  PHQ-9 Score 14 - -  Difficult doing work/chores Somewhat difficult - -     Social History   Tobacco Use  Smoking Status Former Smoker  . Quit date: 59  . Years since quitting: 29.3  Smokeless Tobacco Never Used   BP  Readings from Last 3 Encounters:  02/03/21 (!) 149/92  01/14/21 128/84  12/31/20 124/60   Pulse Readings from Last 3 Encounters:  02/03/21 71  01/14/21 74  12/31/20 80   Wt Readings from Last 3 Encounters:  02/03/21 (!) 326 lb (147.9 kg)  01/14/21 (!) 328 lb (148.8 kg)  12/31/20 (!) 325 lb (147.4 kg)    Assessment/Interventions: Review of patient past medical history, allergies, medications, health status, including review of consultants reports, laboratory and other test data, was performed as part of comprehensive evaluation and provision of chronic care management services.   SDOH:  (Social Determinants of Health) assessments and interventions performed: Yes   CCM Care Plan  Allergies  Allergen Reactions  . Androgel [Testosterone] Other (See Comments)    Pedal edema   . Biaxin [Clarithromycin] Other (See Comments)    Taste perception alteration  . Duloxetine Other (See Comments)    Hallucinations  . Gabapentin Other (See Comments)    hallucinations  . Ibuprofen Other (See Comments)    GI upset   . Lyrica [Pregabalin] Swelling  . Spironolactone Other (See Comments)    Medications Reviewed Today    Reviewed by Burnice Logan, [facility] (Pharmacist) on 03/28/21 at [place] List Status: <None>  Medication Order Taking? Sig Documenting Provider Last Dose Status Informant  acetaminophen (TYLENOL) 500 MG tablet [id number] Yes Take 1,000 mg by mouth every 6 (six) hours as needed for moderate pain or headache. [provider] Taking Active Family Member  amiodarone (PACERONE) 200 MG tablet [id number] Yes Take 1 tablet (200 mg total) by mouth every evening. Richardo Priest, MD Taking Active   apixaban (ELIQUIS) 5 MG TABS tablet [id number] Yes Take 5 mg by mouth 2 (two) times daily.  [provider] Taking Active Family Member  b complex vitamins tablet [id number] Yes Take 1 tablet by mouth daily. [provider] Taking Active   BD PEN NEEDLE MICRO U/F 32G X 6  MM MISC [id number] Yes SMARTSIG:1 Needle SUB-Q Daily [provider] Taking Active   Budeson-Glycopyrrol-Formoterol (BREZTRI AEROSPHERE) 160-9-4.8 MCG/ACT AERO [id number] Yes Inhale 2 puffs into the lungs 2 (two) times daily. Rochel Brome, MD Taking Active   Catheters (FREEDOM CATH MALE EXT CATHETER) MISC [id number] Yes 1 each by Does not apply route daily. Lillard Anes, MD Taking Active   cholecalciferol (VITAMIN D3) 25 MCG (1000 UT) tablet [id number] Yes Take 1,000 Units by mouth daily.  [provider] Taking Active   CINNAMON PO [id number] Yes Take 700 mg by mouth daily.  [provider] Taking Active Family Member  dapagliflozin propanediol (FARXIGA) 10 MG TABS tablet [id number] Yes Take 1 tablet (10 mg total) by mouth daily before breakfast. Rochel Brome, MD Taking Active  folic acid (FOLVITE) 144 MCG tablet [id number] Yes Take 800 mcg by mouth daily. [provider] Taking Active   HYDROcodone-acetaminophen (NORCO) 10-325 MG tablet [id number] Yes Take 1 tablet by mouth every 4 (four) hours as needed for moderate pain. [provider] Taking Active   insulin degludec (TRESIBA FLEXTOUCH) 100 UNIT/ML FlexTouch Pen [id number] Yes Inject 50 Units into the skin daily. [provider] Taking Active   ipratropium-albuterol (DUONEB) 0.5-2.5 (3) MG/3ML SOLN [id number] Yes Take 3 mLs by nebulization every 6 (six) hours as needed (shortness of breath).  [provider] Taking Active Family Member  levocetirizine (XYZAL) 5 MG tablet [id number] Yes Take 5 mg by mouth daily as needed for allergies. [provider] Taking Active Family Member  magnesium chloride (SLOW-MAG) 64 MG TBEC SR tablet [id number] Yes Take 1 tablet (64 mg total) by mouth 2 (two) times daily.  Patient taking differently: Take 1 tablet by mouth daily.   Richardo Priest, MD Taking Active   metolazone (ZAROXOLYN) 5 MG tablet [id number] Yes Take 5 mg by mouth once a week. Uses  prn if swollen once weekly. [provider] Taking Active   metoprolol tartrate (LOPRESSOR) 50 MG tablet [id number] Yes Take 1 tablet (50 mg total) by mouth in the morning, at noon, and at bedtime. Richardo Priest, MD Taking Active   montelukast (SINGULAIR) 10 MG tablet [id number] Yes Take 1 tablet (10 mg total) by mouth every evening. Cox, Kirsten, MD Taking Active   mupirocin ointment (BACTROBAN) 2 % [id number] Yes Apply 1 application topically 2 (two) times daily. Cox, Kirsten, MD Taking Active   Omeprazole 20 MG TBEC [id number] Yes Take 20 mg by mouth 2 (two) times daily before a meal.  [provider] Taking Active Family Member  potassium chloride SA (K-DUR) 20 MEQ tablet [id number] Yes Take 20 mEq by mouth 2 (two) times daily. [provider] Taking Active   pyridOXINE (VITAMIN B-6) 100 MG tablet [id number] Yes Take 100 mg by mouth daily.  [provider] Taking Active Family Member  rOPINIRole (REQUIP) 0.5 MG tablet [id number] Yes TAKE 1 TABLET BY MOUTH AT BEDTIME Marge Duncans, PA-C Taking Active   rosuvastatin (CRESTOR) 20 MG tablet [id number] Yes Take 1 tablet by mouth once daily Marge Duncans, PA-C Taking Active   Semaglutide, 1 MG/DOSE, (OZEMPIC, 1 MG/DOSE,) 2 MG/1.5ML SOPN [id number] Yes Inject 1 mg into the skin once a week. Cox, Kirsten, MD Taking Active   sertraline (ZOLOFT) 50 MG tablet [id number] Yes Take 1 tablet by mouth twice daily Marge Duncans, PA-C Taking Active   sulfamethoxazole-trimethoprim (BACTRIM DS) 800-160 MG tablet [id number] No Take 1 tablet by mouth 2 (two) times daily.  Patient not taking: Reported on 03/28/2021   CoxElnita Maxwell, MD Not Taking Active   tamsulosin (FLOMAX) 0.4 MG CAPS capsule [id number] Yes TAKE 1 CAPSULE BY MOUTH BEFORE BEDTIME Cox, Kirsten, MD Taking Active   torsemide (DEMADEX) 20 MG tablet [id number] Yes Take 1 tablet (20 mg total) by mouth 2 (two) times daily. Take an extra tablet (20 mg) in the afternoon on Monday, Wednesday,  Friday.  Patient taking differently: Take 40 mg by mouth 2 (two) times daily. 2 in the am and 2 at noon   Richardo Priest, MD Taking Active   VENTOLIN HFA 108 (90 Base) MCG/ACT inhaler [id number] Yes Inhale 2 puffs into the lungs every 4 (four) hours as needed for wheezing or shortness of breath. Rochel Brome, MD Taking Active  Patient Active Problem List   Diagnosis Date Noted  . Acute sinusitis 10/17/2020  . Orchitis 07/18/2020  . Testicular pain, right 07/15/2020  . Atrial fibrillation, chronic ([place]) 05/31/2020  . Anxiety 05/31/2020  . BMI 45.0-49.9, adult ([place]) 04/11/2020  . Balanitis 04/11/2020  . Diabetes mellitus due to underlying condition, controlled, with diabetic polyneuropathy, with long-term current use of insulin ([place]) 02/23/2020  . Gastroesophageal reflux disease without esophagitis 02/23/2020  . Chronic anticoagulation 06/14/2019  . 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 Persistent atrial fibrillation ([place]) 06/14/2019  . Medication management 04/19/2019  . On amiodarone therapy 03/24/2019  . Polyneuropathy associated with underlying disease ([place]) 10/11/2018  . Hypokalemia 10/04/2018  . PAF (paroxysmal atrial fibrillation) ([place]) 07/01/2018  . Anticoagulated 07/01/2018  . CKD (chronic kidney disease) stage 3, GFR 30-59 ml/min (HCC) 07/01/2018  . COPD (chronic obstructive pulmonary disease) ([place]) 07/01/2018  . Obstructive sleep apnea 07/01/2018  . Hypertensive heart disease 07/01/2018  . Edema of both lower extremities 06/20/2018  . Shortness of breath 06/20/2018  . Obesity, morbid, BMI 40.0-49.9 ([place]) 10/04/2017  . Chronic pain syndrome 08/30/2017  . Restrictive lung disease 06/22/2016  . Generalized edema 06/22/2016  . Restless legs syndrome 03/23/2016  . Presbyopia of both eyes 12/16/2015  . Hyperlipidemia 12/16/2015  . Bilateral pseudophakia  12/16/2015  . Dermatochalasis 12/16/2015  . Chronic obstructive pulmonary disease ([place]) 11/05/2015  . Type 2 diabetes mellitus without complications ([place]) 16/38/4665  . Edema 11/04/2015  . Piriformis syndrome 05/03/2014  . Disorder of bursae and tendons in shoulder region 01/05/2014  . Low back pain 08/17/2013  . Sleep apnea 05/06/2013  . Postlaminectomy syndrome, lumbar region 05/06/2013  . Lumbosacral spondylosis 05/06/2013  . Depressive disorder 05/06/2013  . Chronic pain 05/06/2013  . Benign essential hypertension 05/06/2013    Immunization History  Administered Date(s) Administered  . Fluad Quad(high Dose 65+) 10/11/2020  . Influenza-Unspecified 08/16/2019  . Moderna SARS-COV2 Booster Vaccination 12/10/2020  . Moderna Sars-Covid-2 Vaccination 12/22/2019, 01/19/2020  . Pneumococcal Conjugate-13 07/26/2014  . Pneumococcal Polysaccharide-23 08/19/2012    Conditions to be addressed/monitored:  Hypertension, Hyperlipidemia, Diabetes, Atrial Fibrillation, Heart Failure, GERD, COPD, Chronic Kidney Disease and Restless Leg Syndrome  Care Plan : [place]  Updates made by Burnice Logan, RPH since 03/28/2021 12:00 AM    Problem: dm, afib, chf, hld   Priority: High  Onset Date: 02/07/2021    Long-Range Goal: Disease Management   Start Date: 02/07/2021  Expected End Date: 02/07/2022  Recent Progress: On track  Priority: High  Note:    Current Barriers:  . Unable to achieve control of diabetes   Pharmacist Clinical Goal(s):  [name] Over the next 90 days, patient will verbalize ability to afford treatment regimen . achieve adherence to monitoring guidelines and medication adherence to achieve therapeutic efficacy . achieve control of diabetes as evidenced by a1c through collaboration with PharmD and provider.   Interventions: . 1:1 collaboration with Rochel Brome, MD regarding development and update of comprehensive plan of care as evidenced by provider attestation and  co-signature . Inter-disciplinary care team collaboration (see longitudinal plan of care) . Comprehensive medication review performed; medication list updated in electronic medical record  Hyperlipidemia: (LDL goal < 70) -Controlled -Current treatment: . Rosuvastatin 20 mg daily  -Medications previously tried: atorvastatin  -Current dietary patterns: daughter tries to get some healthy options when she cooks for them.  -Current exercise habits:  minimal due to mobility and back pain  -Educated on Cholesterol goals;  Benefits of statin for ASCVD risk reduction; Importance of limiting foods high in cholesterol; -Counseled on diet and exercise extensively Recommended to continue current medication  Diabetes (A1c goal <7%) -Uncontrolled -Current medications: . Farxiga 10 mg daily  . Tresiba 50 units daily . Ozempic 1 mg weekly  -Medications previously tried:  glipizide, metformin, Soliqua -Current home glucose readings . fasting glucose: 160 mg/dL . post prandial glucose: not reproted -Denies hypoglycemic/hyperglycemic symptoms -Current meal patterns:  . Patient sneaks cookies and snacks. Daughter tries to prepare a balanced meal when cooking for patient.  -Current exercise: minimal  -Educated on A1c and blood sugar goals; Complications of diabetes including kidney damage, retinal damage, and cardiovascular disease; Benefits of routine self-monitoring of blood sugar; -Counseled to check feet daily and get yearly eye exams -Counseled on diet and exercise extensively Patient tolerating Farxiga 10 mg daily and arrived through patient assistance.   Atrial Fibrillation (Goal: prevent stroke and major bleeding) -Controlled -CHADSVASC: 5  -Current treatment: . Rate/Rhythm control: amiodarone 200 mg daily in the evening and metoprolol 50 mg morning, noon and bedtime  . Anticoagulation: Eliquis 5 mg bid  -Medications previously tried: none reported -Home BP and HR readings: fluctuates   -Counseled on increased risk of stroke due to Afib and benefits of anticoagulation for stroke prevention; importance of adherence to anticoagulant exactly as prescribed; Pharmacist will coordinate patient assistance application once 3% spent on medications.   -Counseled on diet and exercise extensively Recommended to continue current medication Assessed patient finances. for eligibilty of renewing Eliquis patient assistance for 2022. Waiting for out of pocket cost report to apply.   Heart Failure (Goal: manage symptoms and prevent exacerbations) -Controlled -Last ejection fraction: 60-65% (Date: 10/21) -HF type: Diastolic -Current treatment: . torsemide 40 mg bid  . Metoprolol tartrate 50 mg tid . Metolazone 5 mg once weekly if needed -Medications previously tried: furosemide  -Current home BP/HR readings: ~130/80 -Current dietary habits: daughter tries to provide healthy options  -Current exercise habits: limited by mobility and pain  -Educated on Benefits of medications for managing symptoms and prolonging life Importance of blood pressure control -Counseled on diet and exercise extensively Recommended to continue current medication  COPD (Goal: control symptoms and prevent exacerbations) -Controlled -Current treatment  . Breztri 2 puffs bid  . Ipratropium 3 mls every 6 hours prn  . Montelukast 10 mg daily  . Ventolin 2 puffs every 4 hours prn wheezing or shortness of breath -Medications previously tried: trelegy  -Patient reports consistent use of maintenance inhaler -Frequency of rescue inhaler use: weekly  -Counseled on [place] inhaler technique; Benefits of consistent maintenance inhaler use -Recommended to continue current medication   Patient Goals/Self-Care Activities . Over the next 90 days, patient will:  - take medications as prescribed focus on medication adherence by using pill box check glucose twice daily, document, and provide at future appointments check  blood pressure daily, document, and provide at future appointments  Follow Up Plan: Telephone follow up appointment with care management team member scheduled for: 05/09/2021      Medication Assistance: Pilar Grammes, Ozempic, Tresibaobtained through [facility] and NovoNordisk  medication assistance program.  Enrollment ends 11/29/2021  Patient will reach out to pharmacist once 3% out of pocket is met to apply for Eliquis this year.   Patient's preferred pharmacy is:  [facility], [place] - [address]  [place] [address] Phone: [phone] Fax: [phone]  [facility] - [place], [place] - [address] [place] [address] Phone: [phone] Fax: [phone]  [place], [place] [address] [place] [address] Phone: [phone] Fax: [phone], [place] (New Address) - [place], [place] AT Previously: [place], [address] [place] [address] Phone: [phone] Fax: [place], [place] [address] [place] [address] Phone: [phone] Fax: [phone]  Uses pill box? Yes Pt endorses good compliance  We discussed: Benefits of medication synchronization, packaging and delivery as well as enhanced pharmacist oversight with [employer]. Patient decided to: Continue current medication management strategy  Care Plan and Follow Up Patient Decision:  Patient agrees to Care Plan and Follow-up.  Plan: Telephone follow up appointment with care management team member scheduled for:  05/02/2021

## 2021-04-03 ENCOUNTER — Other Ambulatory Visit: Payer: Self-pay | Admitting: Physician Assistant

## 2021-04-07 ENCOUNTER — Telehealth: Payer: Self-pay

## 2021-04-07 NOTE — Progress Notes (Signed)
Chronic Care Management Pharmacy Assistant   Name: LADDIE MATH  MRN: [id number] DOB: [birth year]  Reason for Encounter: Medication Review for increase in Ozempic   03/28/21-Sara Brown, [place], visit noted, Blood sugar remains above goal. Recommend considering Ozempic 2 mg daily once available. Pharmacist can coordiante supply through [facility] patient assistance.   Patient reports breathing is well controlled currently.   Patient's daughter will provide medication cost report to apply for Eliquis.     Medications: Outpatient Encounter Medications as of 04/07/2021  Medication Sig  . acetaminophen (TYLENOL) 500 MG tablet Take 1,000 mg by mouth every 6 (six) hours as needed for moderate pain or headache.  [name] amiodarone (PACERONE) 200 MG tablet Take 1 tablet (200 mg total) by mouth every evening.  [name] apixaban (ELIQUIS) 5 MG TABS tablet Take 5 mg by mouth 2 (two) times daily.   [name] b complex vitamins tablet Take 1 tablet by mouth daily.  . BD PEN NEEDLE MICRO U/F 32G X 6 MM MISC SMARTSIG:1 Needle SUB-Q Daily  . Budeson-Glycopyrrol-Formoterol (BREZTRI AEROSPHERE) 160-9-4.8 MCG/ACT AERO Inhale 2 puffs into the lungs 2 (two) times daily.  . Catheters (FREEDOM CATH MALE EXT CATHETER) MISC 1 each by Does not apply route daily.  . cholecalciferol (VITAMIN D3) 25 MCG (1000 UT) tablet Take 1,000 Units by mouth daily.   [name] CINNAMON PO Take 700 mg by mouth daily.   . dapagliflozin propanediol (FARXIGA) 10 MG TABS tablet Take 1 tablet (10 mg total) by mouth daily before breakfast.  . folic acid (FOLVITE) 035 MCG tablet Take 800 mcg by mouth daily.  [name] HYDROcodone-acetaminophen (NORCO) 10-325 MG tablet Take 1 tablet by mouth every 4 (four) hours as needed for moderate pain.  [name] insulin degludec (TRESIBA FLEXTOUCH) 100 UNIT/ML FlexTouch Pen Inject 50 Units into the skin daily.  [name] ipratropium-albuterol (DUONEB) 0.5-2.5 (3) MG/3ML SOLN Take 3 mLs by nebulization every 6 (six) hours as needed (shortness of  breath).   [name] levocetirizine (XYZAL) 5 MG tablet Take 5 mg by mouth daily as needed for allergies.  . magnesium chloride (SLOW-MAG) 64 MG TBEC SR tablet Take 1 tablet (64 mg total) by mouth 2 (two) times daily. (Patient taking differently: Take 1 tablet by mouth daily.)  . metolazone (ZAROXOLYN) 5 MG tablet Take 5 mg by mouth once a week. Uses prn if swollen once weekly.  . metoprolol tartrate (LOPRESSOR) 50 MG tablet Take 1 tablet (50 mg total) by mouth in the morning, at noon, and at bedtime.  . montelukast (SINGULAIR) 10 MG tablet Take 1 tablet (10 mg total) by mouth every evening.  . mupirocin ointment (BACTROBAN) 2 % Apply 1 application topically 2 (two) times daily.  . Omeprazole 20 MG TBEC Take 20 mg by mouth 2 (two) times daily before a meal.   . potassium chloride SA (K-DUR) 20 MEQ tablet Take 20 mEq by mouth 2 (two) times daily.  [name] pyridOXINE (VITAMIN B-6) 100 MG tablet Take 100 mg by mouth daily.   [name] rOPINIRole (REQUIP) 0.5 MG tablet TAKE 1 TABLET BY MOUTH AT BEDTIME  . rosuvastatin (CRESTOR) 20 MG tablet Take 1 tablet by mouth once daily  . Semaglutide, 1 MG/DOSE, (OZEMPIC, 1 MG/DOSE,) 2 MG/1.5ML SOPN Inject 1 mg into the skin once a week.  . sertraline (ZOLOFT) 50 MG tablet Take 1 tablet by mouth twice daily  . sulfamethoxazole-trimethoprim (BACTRIM DS) 800-160 MG tablet Take 1 tablet by mouth 2 (two) times daily. (Patient not taking: Reported on 03/28/2021)  .  tamsulosin (FLOMAX) 0.4 MG CAPS capsule TAKE 1 CAPSULE BY MOUTH BEFORE BEDTIME  . torsemide (DEMADEX) 20 MG tablet Take 1 tablet (20 mg total) by mouth 2 (two) times daily. Take an extra tablet (20 mg) in the afternoon on Monday, Wednesday, Friday. (Patient taking differently: Take 40 mg by mouth 2 (two) times daily. 2 in the am and 2 at noon)  . VENTOLIN HFA 108 (90 Base) MCG/ACT inhaler Inhale 2 puffs into the lungs every 4 (four) hours as needed for wheezing or shortness of breath.   No facility-administered encounter  medications on file as of 04/07/2021.    Dr. Tobie Poet has approved the patient to increase Ozempic to 2mg  per day. It is ok for the patient to take 2 shots instead of one, until the new medication comes in.  Donette Larry, CPP has sent in new script, but not sure how soon the medication will come.    I have called patient daughter to let her know of the improved increase.   Called Cindy at [phone], VM was full, I sent a detailed text message with instructions and call back information.   I have notified Donette Larry, CPP, that a text was sent.   Clarita Leber, [place] Pharmacist Assistant [phone]

## 2021-04-14 NOTE — Progress Notes (Addendum)
  Chronic Care Management   Note  04/17/2021 Name: Shawn Sullivan MRN: [id number] DOB: 29-Aug-1946   04/14/21-Spoke to wife and she was not sure if he had increased his Ozempic,  I had sent his daughter a text message with instructions, have gotten no response.  I told patient wife that he is to increase the dose, she will mention to daughter.  I tried to call Daughter today, VM was full.  Wife, asked the patient his glucose numbers are between 160-170-  He is denying any symptoms, or issues with his medications

## 2021-04-18 ENCOUNTER — Ambulatory Visit: Payer: Medicare HMO | Admitting: Family Medicine

## 2021-04-18 ENCOUNTER — Other Ambulatory Visit: Payer: Self-pay | Admitting: Family Medicine

## 2021-04-18 DIAGNOSIS — J069 Acute upper respiratory infection, unspecified: Secondary | ICD-10-CM | POA: Diagnosis not present

## 2021-04-30 NOTE — Progress Notes (Signed)
Chronic Care Management Pharmacy Note  05/02/2021 Name:  Shawn Sullivan MRN:  [id number] DOB:  04-04-1946   Plan Recommendations:   Pharmacist following up on Eliquis application and shipment.  Pharmacist reviewed blood sugar readings with daughter. Patient has not been checking regularly. Daughter reports that blood sugar management seems to be improving.  Subjective: Shawn Sullivan is an 75 y.o. year old male who is a primary patient of Cox, Kirsten, MD.  The CCM team was consulted for assistance with disease management and care coordination needs.    Engaged with patient by telephone for follow up visit in response to provider referral for pharmacy case management and/or care coordination services.   Consent to Services:  The patient was given information about Chronic Care Management services, agreed to services, and gave verbal consent prior to initiation of services.  Please see initial visit note for detailed documentation.   Patient Care Team: Rochel Brome, MD as PCP - General (Family Medicine) Rosita Fire, MD as Consulting Physician (Nephrology) Burnice Logan, [facility] as Pharmacist (Pharmacist)  Recent office visits: 01/14/2021 - healthy diet encouraged. Microalbumin 80.   Recent consult visits: 01/06/2021 - Pain management - injection.  12/31/2020 - Chest x-ray due to amiodarone. No evidence of thyroid toxicity or liver. Stable EKG. Stable BP at targe heart failure compensated and stable chronic kidney disease. Mild aortic stenosis asymptomatic. Repeate echo 1-2 years.   Hospital visits: None in previous 6 months  Objective:  Lab Results  Component Value Date   CREATININE 1.32 (H) 12/10/2020   BUN 17 12/10/2020   GFRNONAA 52 (L) 12/10/2020   GFRAA 60 12/10/2020   NA 137 12/10/2020   K 4.7 12/10/2020   CALCIUM 10.1 12/10/2020   CO2 28 12/10/2020    Lab Results  Component Value Date/Time   HGBA1C 9.4 (H) 12/10/2020 01:42 PM   HGBA1C 12.0 (H)  05/31/2020 10:48 AM   MICROALBUR 80 01/14/2021 11:53 AM   MICROALBUR 150 05/31/2020 11:09 AM    Last diabetic Eye exam: No results found for: HMDIABEYEEXA  Last diabetic Foot exam: No results found for: HMDIABFOOTEX   Lab Results  Component Value Date   CHOL 126 12/10/2020   HDL 35 (L) 12/10/2020   LDLCALC 65 12/10/2020   TRIG 148 12/10/2020   CHOLHDL 3.6 12/10/2020    Hepatic Function Latest Ref Rng & Units 12/10/2020 07/18/2020 05/31/2020  Total Protein 6.0 - 8.5 g/dL 6.5 - 6.2  Albumin 3.7 - 4.7 g/dL 3.9 3.7 3.6(L)  AST 0 - 40 IU/L 35 - 33  ALT 0 - 44 IU/L 23 - 22  Alk Phosphatase 44 - 121 IU/L 173(H) - 140(H)  Total Bilirubin 0.0 - 1.2 mg/dL 0.7 - 0.2    No results found for: TSH, FREET4  CBC Latest Ref Rng & Units 12/10/2020 07/18/2020 05/31/2020  WBC 3.4 - 10.8 x10E3/uL 12.1(H) 11.3(H) 10.9(H)  Hemoglobin 13.0 - 17.7 g/dL 13.2 14.2 15.0  Hematocrit 37.5 - 51.0 % 41.4 44.9 46.1  Platelets 150 - 450 x10E3/uL 270 308 211    Lab Results  Component Value Date/Time   VD25OH 29.5 (L) 07/18/2020 02:32 PM   VD25OH 23.5 (L) 03/19/2020 01:45 PM    Clinical ASCVD: No  The ASCVD Risk score Mikey Bussing DC Jr., et al., 2013) failed to calculate for the following reasons:   The valid total cholesterol range is 130 to 320 mg/dL    Depression screen [facility] 2/9 01/14/2021 01/14/2018 07/15/2017  Decreased Interest 0 0  0  Down, Depressed, Hopeless 1 0 0  PHQ - 2 Score 1 0 0  Altered sleeping 3 - -  Tired, decreased energy 3 - -  Change in appetite 3 - -  Feeling bad or failure about yourself  1 - -  Trouble concentrating 1 - -  Moving slowly or fidgety/restless 2 - -  Suicidal thoughts 0 - -  PHQ-9 Score 14 - -  Difficult doing work/chores Somewhat difficult - -     Social History   Tobacco Use  Smoking Status Former Smoker  . Quit date: 11  . Years since quitting: 29.4  Smokeless Tobacco Never Used   BP Readings from Last 3 Encounters:  02/03/21 (!) 149/92  01/14/21 128/84   12/31/20 124/60   Pulse Readings from Last 3 Encounters:  02/03/21 71  01/14/21 74  12/31/20 80   Wt Readings from Last 3 Encounters:  02/03/21 (!) 326 lb (147.9 kg)  01/14/21 (!) 328 lb (148.8 kg)  12/31/20 (!) 325 lb (147.4 kg)    Assessment/Interventions: Review of patient past medical history, allergies, medications, health status, including review of consultants reports, laboratory and other test data, was performed as part of comprehensive evaluation and provision of chronic care management services.   SDOH:  (Social Determinants of Health) assessments and interventions performed: Yes   CCM Care Plan  Allergies  Allergen Reactions  . Androgel [Testosterone] Other (See Comments)    Pedal edema   . Biaxin [Clarithromycin] Other (See Comments)    Taste perception alteration  . Duloxetine Other (See Comments)    Hallucinations  . Gabapentin Other (See Comments)    hallucinations  . Ibuprofen Other (See Comments)    GI upset   . Lyrica [Pregabalin] Swelling  . Spironolactone Other (See Comments)    Medications Reviewed Today    Reviewed by Burnice Logan, [facility] (Pharmacist) on 05/02/21 at 1130  Med List Status: <None>  Medication Order Taking? Sig Documenting Provider Last Dose Status Informant  acetaminophen (TYLENOL) 500 MG tablet [id number] Yes Take 1,000 mg by mouth every 6 (six) hours as needed for moderate pain or headache. [provider] Taking Active Family Member  amiodarone (PACERONE) 200 MG tablet [id number] Yes Take 1 tablet (200 mg total) by mouth every evening. Richardo Priest, MD Taking Active   apixaban (ELIQUIS) 5 MG TABS tablet [id number] Yes Take 5 mg by mouth 2 (two) times daily.  [provider] Taking Active Family Member  b complex vitamins tablet [id number] Yes Take 1 tablet by mouth daily. [provider] Taking Active   BD PEN NEEDLE MICRO U/F 32G X 6 MM MISC [id number] Yes SMARTSIG:1 Needle SUB-Q Daily [provider] Taking Active   Budeson-Glycopyrrol-Formoterol (BREZTRI AEROSPHERE) 160-9-4.8 MCG/ACT AERO [id number] Yes Inhale 2 puffs into the lungs 2 (two) times daily. Rochel Brome, MD Taking Active   Catheters (FREEDOM CATH MALE EXT CATHETER) MISC [id number] Yes 1 each by Does not apply route daily. Lillard Anes, MD Taking Active   cholecalciferol (VITAMIN D3) 25 MCG (1000 UT) tablet [id number] Yes Take 1,000 Units by mouth daily.  [provider] Taking Active   CINNAMON PO [id number] Yes Take 700 mg by mouth daily.  [provider] Taking Active Family Member  dapagliflozin propanediol (FARXIGA) 10 MG TABS tablet [id number] Yes Take 1 tablet (10 mg total) by mouth daily before breakfast. Cox, Kirsten, MD Taking Active   folic acid (FOLVITE) 438 MCG tablet [id number] Yes Take 800 mcg  by mouth daily. [provider] Taking Active   HYDROcodone-acetaminophen (NORCO) 10-325 MG tablet [id number] Yes Take 1 tablet by mouth every 4 (four) hours as needed for moderate pain. [provider] Taking Active   insulin degludec (TRESIBA FLEXTOUCH) 100 UNIT/ML FlexTouch Pen [id number] Yes Inject 50 Units into the skin daily. [provider] Taking Active   ipratropium-albuterol (DUONEB) 0.5-2.5 (3) MG/3ML SOLN [id number] Yes Take 3 mLs by nebulization every 6 (six) hours as needed (shortness of breath).  [provider] Taking Active Family Member  levocetirizine (XYZAL) 5 MG tablet [id number] Yes Take 5 mg by mouth daily as needed for allergies. [provider] Taking Active Family Member  magnesium chloride (SLOW-MAG) 64 MG TBEC SR tablet [id number] Yes Take 1 tablet (64 mg total) by mouth 2 (two) times daily.  Patient taking differently: Take 1 tablet by mouth daily.   Richardo Priest, MD Taking Active   metolazone (ZAROXOLYN) 5 MG tablet [id number] Yes Take 5 mg by mouth once a week. Uses prn if swollen once weekly. [provider] Taking  Active   metoprolol tartrate (LOPRESSOR) 50 MG tablet [id number] Yes Take 1 tablet (50 mg total) by mouth in the morning, at noon, and at bedtime. Richardo Priest, MD Taking Active   montelukast (SINGULAIR) 10 MG tablet [id number] Yes TAKE 1 TABLET BY MOUTH ONCE DAILY IN THE Julio Alm, Kirsten, MD Taking Active   mupirocin ointment (BACTROBAN) 2 % [id number] No Apply 1 application topically 2 (two) times daily.  Patient not taking: Reported on 05/02/2021   CoxElnita Maxwell, MD Not Taking Active   Omeprazole 20 MG TBEC [id number] Yes Take 20 mg by mouth 2 (two) times daily before a meal.  [provider] Taking Active Family Member  potassium chloride SA (K-DUR) 20 MEQ tablet [id number] Yes Take 20 mEq by mouth 2 (two) times daily. [provider] Taking Active   pyridOXINE (VITAMIN B-6) 100 MG tablet [id number] Yes Take 100 mg by mouth daily.  [provider] Taking Active Family Member  rOPINIRole (REQUIP) 0.5 MG tablet [id number] Yes TAKE 1 TABLET BY MOUTH AT BEDTIME Cox, Kirsten, MD Taking Active   rosuvastatin (CRESTOR) 20 MG tablet [id number] Yes Take 1 tablet by mouth once daily Marge Duncans, PA-C Taking Active   Semaglutide, 1 MG/DOSE, (OZEMPIC, 1 MG/DOSE,) 2 MG/1.5ML SOPN [id number] Yes Inject 1 mg into the skin once a week. Cox, Kirsten, MD Taking Active            Med Note Simms, [place]   Fri May 02, 2021 11:29 AM) Patient taking 1 mg twice weekly right now until 2 mg dose available. Daughter divided doses due to burning per patient request.   sertraline (ZOLOFT) 50 MG tablet [id number] Yes Take 1 tablet by mouth twice daily Marge Duncans, PA-C Taking Active   sulfamethoxazole-trimethoprim (BACTRIM DS) 800-160 MG tablet [id number] No Take 1 tablet by mouth 2 (two) times daily.  Patient not taking: No sig reported   Cox, Elnita Maxwell, MD Not Taking Active   tamsulosin (FLOMAX) 0.4 MG CAPS capsule [id number] Yes TAKE 1 CAPSULE BY MOUTH BEFORE BEDTIME Cox, Kirsten, MD Taking Active    torsemide (DEMADEX) 20 MG tablet [id number] Yes Take 1 tablet (20 mg total) by mouth 2 (two) times daily. Take an extra tablet (20 mg) in the afternoon on Monday, Wednesday, Friday.  Patient taking differently: Take 40 mg by mouth 2 (two) times daily. 2 in the am and 2 at noon  Richardo Priest, MD Taking Active   VENTOLIN HFA 108 [phone] Base) MCG/ACT inhaler [id number] Yes Inhale 2 puffs into the lungs every 4 (four) hours as needed for wheezing or shortness of breath. CoxElnita Maxwell, MD Taking Active           Patient Active Problem List   Diagnosis Date Noted  . Acute sinusitis 10/17/2020  . Orchitis 07/18/2020  . Testicular pain, right 07/15/2020  . Atrial fibrillation, chronic ([place]) 05/31/2020  . Anxiety 05/31/2020  . BMI 45.0-49.9, adult ([place]) 04/11/2020  . Balanitis 04/11/2020  . Diabetes mellitus due to underlying condition, controlled, with diabetic polyneuropathy, with long-term current use of insulin ([place]) 02/23/2020  . Gastroesophageal reflux disease without esophagitis 02/23/2020  . Chronic anticoagulation 06/14/2019  . Hypertensive heart and kidney disease with chronic diastolic congestive heart failure and stage 3 chronic kidney disease ([place]) 06/14/2019  . Persistent atrial fibrillation ([place]) 06/14/2019  . Medication management 04/19/2019  . On amiodarone therapy 03/24/2019  . Polyneuropathy associated with underlying disease ([place]) 10/11/2018  . Hypokalemia 10/04/2018  . PAF (paroxysmal atrial fibrillation) ([place]) 07/01/2018  . Anticoagulated 07/01/2018  . CKD (chronic kidney disease) stage 3, GFR 30-59 ml/min (HCC) 07/01/2018  . COPD (chronic obstructive pulmonary disease) ([place]) 07/01/2018  . Obstructive sleep apnea 07/01/2018  . Hypertensive heart disease 07/01/2018  . Edema of both lower extremities 06/20/2018  . Shortness of breath 06/20/2018  . Obesity, morbid, BMI 40.0-49.9 ([place]) 10/04/2017  . Chronic pain syndrome 08/30/2017  . Restrictive lung disease 06/22/2016   . Generalized edema 06/22/2016  . Restless legs syndrome 03/23/2016  . Presbyopia of both eyes 12/16/2015  . Hyperlipidemia 12/16/2015  . Bilateral pseudophakia 12/16/2015  . Dermatochalasis 12/16/2015  . Chronic obstructive pulmonary disease ([place]) 11/05/2015  . Type 2 diabetes mellitus without complications ([place]) 53/29/9242  . Edema 11/04/2015  . Piriformis syndrome 05/03/2014  . Disorder of bursae and tendons in shoulder region 01/05/2014  . Low back pain 08/17/2013  . Sleep apnea 05/06/2013  . Postlaminectomy syndrome, lumbar region 05/06/2013  . Lumbosacral spondylosis 05/06/2013  . Depressive disorder 05/06/2013  . Chronic pain 05/06/2013  . Benign essential hypertension 05/06/2013    Immunization History  Administered Date(s) Administered  . Fluad Quad(high Dose 65+) 10/11/2020  . Influenza-Unspecified 08/16/2019  . Moderna SARS-COV2 Booster Vaccination 12/10/2020  . Moderna Sars-Covid-2 Vaccination 12/22/2019, 01/19/2020  . Pneumococcal Conjugate-13 07/26/2014  . Pneumococcal Polysaccharide-23 08/19/2012    Conditions to be addressed/monitored:  Hypertension, Hyperlipidemia, Diabetes, Atrial Fibrillation, Heart Failure, GERD, COPD, Chronic Kidney Disease and Restless Leg Syndrome  Care Plan : [place]  Updates made by Burnice Logan, RPH since 05/02/2021 12:00 AM    Problem: dm, afib, chf, hld   Priority: High  Onset Date: 02/07/2021    Long-Range Goal: Disease Management   Start Date: 02/07/2021  Expected End Date: 02/07/2022  This Visit's Progress: On track  Recent Progress: On track  Priority: High  Note:    Current Barriers:  . Unable to achieve control of diabetes   Pharmacist Clinical Goal(s):  [name] Over the next 90 days, patient will verbalize ability to afford treatment regimen . achieve adherence to monitoring guidelines and medication adherence to achieve therapeutic efficacy . achieve control of diabetes as evidenced by a1c through  collaboration with PharmD and provider.   Interventions: . 1:1 collaboration with Rochel Brome, MD regarding development and update of comprehensive plan of care as evidenced by provider attestation and co-signature . Inter-disciplinary care team collaboration (  see longitudinal plan of care) . Comprehensive medication review performed; medication list updated in electronic medical record  Hyperlipidemia: (LDL goal < 70) -Controlled -Current treatment: . Rosuvastatin 20 mg daily  -Medications previously tried: atorvastatin  -Current dietary patterns: daughter tries to get some healthy options when she cooks for them.  -Current exercise habits: minimal due to mobility and back pain  -Educated on Cholesterol goals;  Benefits of statin for ASCVD risk reduction; Importance of limiting foods high in cholesterol; -Counseled on diet and exercise extensively Recommended to continue current medication  Diabetes (A1c goal <7%) -Uncontrolled -Current medications: . Farxiga 10 mg daily  . Tresiba 50 units daily . Ozempic 2 mg weekly - finishing up 1 mg dose and using twice weekly  -Medications previously tried:  glipizide, metformin, Soliqua -Current home glucose readings . fasting glucose: 160 mg/dL- not checking regularly . post prandial glucose: not reproted -Denies hypoglycemic/hyperglycemic symptoms -Current meal patterns:  . Patient sneaks cookies and snacks. Daughter tries to prepare a balanced meal when cooking for patient. Patient's appetite fluctuates per daughter.  -Current exercise: minimal  -Educated on A1c and blood sugar goals; Complications of diabetes including kidney damage, retinal damage, and cardiovascular disease; Benefits of routine self-monitoring of blood sugar; -Counseled to check feet daily and get yearly eye exams -Counseled on diet and exercise extensively Pharmacist followed up on Ozempic 2 mg weekly dose arriving.   Atrial Fibrillation (Goal: prevent stroke  and major bleeding) -Controlled -CHADSVASC: 5  -Current treatment: . Rate/Rhythm control: amiodarone 200 mg daily in the evening and metoprolol 50 mg morning, noon and bedtime  . Anticoagulation: Eliquis 5 mg bid  -Medications previously tried: none reported -Home BP and HR readings: fluctuates  -Counseled on increased risk of stroke due to Afib and benefits of anticoagulation for stroke prevention; importance of adherence to anticoagulant exactly as prescribed; Pharmacist will coordinate patient assistance application once 3% spent on medications.   -Counseled on diet and exercise extensively Recommended to continue current medication Assessed patient finances. for eligibilty of renewing Eliquis patient assistance for 2022. Pharmacist faxed completed application and proofs of income/expense.   Heart Failure (Goal: manage symptoms and prevent exacerbations) -Controlled -Last ejection fraction: 60-65% (Date: 10/21) -HF type: Diastolic -Current treatment: . torsemide 40 mg bid  . Metoprolol tartrate 50 mg tid . Metolazone 5 mg once weekly if needed -Medications previously tried: furosemide  -Current home BP/HR readings: ~130/80 -Current dietary habits: daughter tries to provide healthy options  -Current exercise habits: limited by mobility and pain  -Educated on Benefits of medications for managing symptoms and prolonging life Importance of blood pressure control -Counseled on diet and exercise extensively Recommended to continue current medication  COPD (Goal: control symptoms and prevent exacerbations) -Controlled -Current treatment  . Breztri 2 puffs bid  . Ipratropium 3 mls every 6 hours prn  . Montelukast 10 mg daily  . Ventolin 2 puffs every 4 hours prn wheezing or shortness of breath -Medications previously tried: trelegy  -Patient reports consistent use of maintenance inhaler -Frequency of rescue inhaler use: weekly  -Counseled on Proper inhaler technique; Benefits  of consistent maintenance inhaler use -Recommended to continue current medication   Patient Goals/Self-Care Activities . Over the next 90 days, patient will:  - take medications as prescribed focus on medication adherence by using pill box check glucose twice daily, document, and provide at future appointments check blood pressure daily, document, and provide at future appointments  Follow Up Plan: Telephone follow up appointment with care management  team member scheduled for: 05/09/2021      Medication Assistance: Koleen Distance, Tresibaobtained through [facility] and Me and NovoNordisk  medication assistance program.  Enrollment ends 11/29/2021   Patient's preferred pharmacy is:  [facility], [place] - [address] [place] [address] Phone: [phone] Fax: [phone]  [facility] - [place], [place] - [address] [place]  [address] Phone: [phone] Fax: [phone]  [place], [place] [address] [place] [address] Phone: [phone] Fax: [phone], [facility] (New Address) - [place], [place] AT Previously: [place], [place] [address] [place] [address] Phone: [phone] Fax: [place], [address] [place] [address] Phone: [phone] Fax: [phone]  Uses pill box? Yes Pt endorses good compliance  We discussed: Benefits of medication synchronization, packaging and delivery as well as enhanced pharmacist oversight with [employer]. Patient decided to: Continue current medication management strategy  Care Plan and Follow Up Patient Decision:  Patient agrees to Care Plan and Follow-up.  Plan: Telephone follow up appointment with  care management team member scheduled for:  07/2021

## 2021-05-01 ENCOUNTER — Telehealth: Payer: Self-pay

## 2021-05-01 NOTE — Progress Notes (Signed)
Chronic Care Management Pharmacy Assistant   Name: Shawn Sullivan  MRN: [id number] DOB: [birth year]  Reason for Encounter: Medication Review for Eliquis PAP status    Medications: Outpatient Encounter Medications as of 05/01/2021  Medication Sig  . acetaminophen (TYLENOL) 500 MG tablet Take 1,000 mg by mouth every 6 (six) hours as needed for moderate pain or headache.  [name] amiodarone (PACERONE) 200 MG tablet Take 1 tablet (200 mg total) by mouth every evening.  [name] apixaban (ELIQUIS) 5 MG TABS tablet Take 5 mg by mouth 2 (two) times daily.   [name] b complex vitamins tablet Take 1 tablet by mouth daily.  . BD PEN NEEDLE MICRO U/F 32G X 6 MM MISC SMARTSIG:1 Needle SUB-Q Daily  . Budeson-Glycopyrrol-Formoterol (BREZTRI AEROSPHERE) 160-9-4.8 MCG/ACT AERO Inhale 2 puffs into the lungs 2 (two) times daily.  . Catheters (FREEDOM CATH MALE EXT CATHETER) MISC 1 each by Does not apply route daily.  . cholecalciferol (VITAMIN D3) 25 MCG (1000 UT) tablet Take 1,000 Units by mouth daily.   [name] CINNAMON PO Take 700 mg by mouth daily.   . dapagliflozin propanediol (FARXIGA) 10 MG TABS tablet Take 1 tablet (10 mg total) by mouth daily before breakfast.  . folic acid (FOLVITE) 322 MCG tablet Take 800 mcg by mouth daily.  [name] HYDROcodone-acetaminophen (NORCO) 10-325 MG tablet Take 1 tablet by mouth every 4 (four) hours as needed for moderate pain.  [name] insulin degludec (TRESIBA FLEXTOUCH) 100 UNIT/ML FlexTouch Pen Inject 50 Units into the skin daily.  [name] ipratropium-albuterol (DUONEB) 0.5-2.5 (3) MG/3ML SOLN Take 3 mLs by nebulization every 6 (six) hours as needed (shortness of breath).   [name] levocetirizine (XYZAL) 5 MG tablet Take 5 mg by mouth daily as needed for allergies.  . magnesium chloride (SLOW-MAG) 64 MG TBEC SR tablet Take 1 tablet (64 mg total) by mouth 2 (two) times daily. (Patient taking differently: Take 1 tablet by mouth daily.)  . metolazone (ZAROXOLYN) 5 MG tablet Take 5 mg by mouth once a week. Uses  prn if swollen once weekly.  . metoprolol tartrate (LOPRESSOR) 50 MG tablet Take 1 tablet (50 mg total) by mouth in the morning, at noon, and at bedtime.  . montelukast (SINGULAIR) 10 MG tablet TAKE 1 TABLET BY MOUTH ONCE DAILY IN THE EVENING  . mupirocin ointment (BACTROBAN) 2 % Apply 1 application topically 2 (two) times daily.  . Omeprazole 20 MG TBEC Take 20 mg by mouth 2 (two) times daily before a meal.   . potassium chloride SA (K-DUR) 20 MEQ tablet Take 20 mEq by mouth 2 (two) times daily.  [name] pyridOXINE (VITAMIN B-6) 100 MG tablet Take 100 mg by mouth daily.   [name] rOPINIRole (REQUIP) 0.5 MG tablet TAKE 1 TABLET BY MOUTH AT BEDTIME  . rosuvastatin (CRESTOR) 20 MG tablet Take 1 tablet by mouth once daily  . Semaglutide, 1 MG/DOSE, (OZEMPIC, 1 MG/DOSE,) 2 MG/1.5ML SOPN Inject 1 mg into the skin once a week.  . sertraline (ZOLOFT) 50 MG tablet Take 1 tablet by mouth twice daily  . sulfamethoxazole-trimethoprim (BACTRIM DS) 800-160 MG tablet Take 1 tablet by mouth 2 (two) times daily. (Patient not taking: Reported on 03/28/2021)  . tamsulosin (FLOMAX) 0.4 MG CAPS capsule TAKE 1 CAPSULE BY MOUTH BEFORE BEDTIME  . torsemide (DEMADEX) 20 MG tablet Take 1 tablet (20 mg total) by mouth 2 (two) times daily. Take an extra tablet (20 mg) in the afternoon on Monday, Wednesday, Friday. (Patient taking differently: Take 40  mg by mouth 2 (two) times daily. 2 in the am and 2 at noon)  . VENTOLIN HFA 108 (90 Base) MCG/ACT inhaler Inhale 2 puffs into the lungs every 4 (four) hours as needed for wheezing or shortness of breath.   No facility-administered encounter medications on file as of 05/01/2021.    Donette Larry, CPP, requested I call Eliquis and check on his application and shipment status, and notify his daughter of the outcome.  I called the representative stated they have not received any new information for the patient for this year.  I notified Donette Larry CPP, she is going to refax and check for samples  for the patient.   05/02/21-representative stated they are not showing the fax, it takes about 3 hours to show in the system.  She gave me an alternate  Number [phone]. Called back, representative stated they had received the application, it could take 3-5 business days to process, I asked to have a rush put on it if possible.   I have notified Donette Larry, CPP  05/07/21-Per representative Almyra Free, patient has been approved, she noted the expedited request, she checked with pharmacy to see, Almyra Free stated medication should arrive at patient home tomorrow. Donette Larry. CPP was notified and will let patients daughter know.  Clarita Leber, [place] Pharmacist Assistant [phone]

## 2021-05-02 ENCOUNTER — Other Ambulatory Visit: Payer: Self-pay

## 2021-05-02 ENCOUNTER — Ambulatory Visit (INDEPENDENT_AMBULATORY_CARE_PROVIDER_SITE_OTHER): Payer: Medicare HMO

## 2021-05-02 DIAGNOSIS — I130 Hypertensive heart and chronic kidney disease with heart failure and stage 1 through stage 4 chronic kidney disease, or unspecified chronic kidney disease: Secondary | ICD-10-CM | POA: Diagnosis not present

## 2021-05-02 DIAGNOSIS — N1831 Chronic kidney disease, stage 3a: Secondary | ICD-10-CM

## 2021-05-02 DIAGNOSIS — E119 Type 2 diabetes mellitus without complications: Secondary | ICD-10-CM

## 2021-05-02 DIAGNOSIS — E1121 Type 2 diabetes mellitus with diabetic nephropathy: Secondary | ICD-10-CM | POA: Diagnosis not present

## 2021-05-02 DIAGNOSIS — I5032 Chronic diastolic (congestive) heart failure: Secondary | ICD-10-CM | POA: Diagnosis not present

## 2021-05-02 DIAGNOSIS — Z794 Long term (current) use of insulin: Secondary | ICD-10-CM | POA: Diagnosis not present

## 2021-05-02 DIAGNOSIS — E0842 Diabetes mellitus due to underlying condition with diabetic polyneuropathy: Secondary | ICD-10-CM

## 2021-05-02 DIAGNOSIS — J449 Chronic obstructive pulmonary disease, unspecified: Secondary | ICD-10-CM | POA: Diagnosis not present

## 2021-05-02 DIAGNOSIS — E1165 Type 2 diabetes mellitus with hyperglycemia: Secondary | ICD-10-CM | POA: Diagnosis not present

## 2021-05-02 DIAGNOSIS — I482 Chronic atrial fibrillation, unspecified: Secondary | ICD-10-CM

## 2021-05-02 NOTE — Patient Instructions (Signed)
Blood Glucose Monitoring, Adult Monitoring your blood sugar (glucose) is an important part of managing your diabetes. Blood glucose monitoring involves checking your blood glucose as often as directed and keeping a log or record of your results over time. Checking your blood glucose regularly and keeping a blood glucose log can:  Help you and your health care provider adjust your diabetes management plan as needed, including your medicines or insulin.  Help you understand how food, exercise, illnesses, and medicines affect your blood glucose.  Let you know what your blood glucose is at any time. You can quickly find out if you have low blood glucose (hypoglycemia) or high blood glucose (hyperglycemia). Your health care provider will set individualized treatment goals for you. Your goals will be based on your age, other medical conditions you have, and how you respond to diabetes treatment. Generally, the goal of treatment is to maintain the following blood glucose levels:  Before meals (preprandial): 80-130 mg/dL (4.4-7.2 mmol/L).  After meals (postprandial): below 180 mg/dL (10 mmol/L).  A1C level: less than 7%. Supplies needed:  Blood glucose meter.  Test strips for your meter. Each meter has its own strips. You must use the strips that came with your meter.  A needle to prick your finger (lancet). Do not use a lancet more than one time.  A device that holds the lancet (lancing device).  A journal or log book to write down your results. How to check your blood glucose Checking your blood glucose 1. Wash your hands for at least 20 seconds with soap and water. 2. Prick the side of your finger (not the tip) with the lancet. Do not use the same finger consecutively. 3. Gently rub the finger until a small drop of blood appears. 4. Follow instructions that come with your meter for inserting the test strip, applying blood to the strip, and using your blood glucose meter. 5. Write down  your result and any notes in your log.   Using alternative sites Some meters allow you to use areas of your body other than your finger (alternative sites) to test your blood. The most common alternative sites are the forearm, the thigh, and the palm of your hand. Alternative sites may not be as accurate as the fingers because blood flow is slower in those areas. This means that the result you get may be delayed, and it may be different from the result that you would get from your finger. Use the finger only, and do not use alternative sites, if:  You think you have hypoglycemia.  You sometimes do not know that your blood glucose is getting low (hypoglycemia unawareness). General tips and recommendations Blood glucose log  Every time you check your blood glucose, write down your result. Also write down any notes about things that may be affecting your blood glucose, such as your diet and exercise for the day. This information can help you and your health care provider: ? Look for patterns in your blood glucose over time. ? Adjust your diabetes management plan as needed.  Check if your meter allows you to download your records to a computer or if there is an app for the meter. Most glucose meters store a record of glucose readings in the meter.   If you have type 1 diabetes:  Check your blood glucose 4 or more times a day if you are on intensive insulin therapy with multiple daily injections (MDI) or if you are using an insulin pump. Check your  blood glucose: ? Before every meal and snack. ? Before bedtime.  Also check your blood glucose: ? If you have symptoms of hypoglycemia. ? After treating low blood glucose. ? Before doing activities that create a risk for injury, like driving or using machinery. ? Before and after exercise. ? Two hours after a meal. ? Occasionally between 2:00 a.m. and 3:00 a.m., as directed.  You may need to check your blood glucose more often, 6-10 times per  day, if: ? You have diabetes that is not well controlled. ? You are ill. ? You have a history of severe hypoglycemia. ? You have hypoglycemia unawareness. If you have type 2 diabetes:  Check your blood glucose 2 or more times a day if you take insulin or other diabetes medicines.  Check your blood glucose 4 or more times a day if you are on intensive insulin therapy. Occasionally, you may also need to check your glucose between 2:00 a.m. and 3:00 a.m., as directed.  Also check your blood glucose: ? Before and after exercise. ? Before doing activities that create a risk for injury, like driving or using machinery.  You may need to check your blood glucose more often if: ? Your medicine is being adjusted. ? Your diabetes is not well controlled. ? You are ill. General tips  Make sure you always have your supplies with you.  After you use a few boxes of test strips, adjust (calibrate) your blood glucose meter by following instructions that came with your meter.  If you have questions or need help, all blood glucose meters have a 24-hour hotline phone number available that you can call. Also contact your health care provider with questions or concerns you may have. Where to find more information  The American Diabetes Association: www.diabetes.org  The Association of Diabetes Care & Education Specialists: www.diabeteseducator.org Contact a health care provider if:  Your blood glucose is at or above 240 mg/dL (13.3 mmol/L) for 2 days in a row.  You have been sick or have had a fever for 2 days or longer, and you are not getting better.  You have any of the following problems for more than 6 hours: ? You cannot eat or drink. ? You have nausea or vomiting. ? You have diarrhea. Get help right away if:  Your blood glucose is lower than 54 mg/dL (3 mmol/L).  You become confused, or you have trouble thinking clearly.  You have difficulty breathing.  You have moderate or large  ketone levels in your urine. These symptoms may represent a serious problem that is an emergency. Do not wait to see if the symptoms will go away. Get medical help right away. Call your local emergency services (911 in the [place] Do not drive yourself to the hospital. Summary  Monitoring your blood glucose is an important part of managing your diabetes.  Blood glucose monitoring involves checking your blood glucose as often as directed and keeping a log or record of your results over time.  Your health care provider will set individualized treatment goals for you. Your goals will be based on your age, other medical conditions you have, and how you respond to diabetes treatment.  Every time you check your blood glucose, write down your result. Also, write down any notes about things that may be affecting your blood glucose, such as your diet and exercise for the day. This information is not intended to replace advice given to you by your health care provider. Make sure  you discuss any questions you have with your health care provider. Document Revised: 08/14/2020 Document Reviewed: 08/14/2020 Elsevier Patient Education  2021 [employer].

## 2021-05-09 ENCOUNTER — Telehealth: Payer: Medicare HMO

## 2021-05-19 DIAGNOSIS — M961 Postlaminectomy syndrome, not elsewhere classified: Secondary | ICD-10-CM | POA: Diagnosis not present

## 2021-05-19 DIAGNOSIS — G894 Chronic pain syndrome: Secondary | ICD-10-CM | POA: Diagnosis not present

## 2021-05-19 DIAGNOSIS — M533 Sacrococcygeal disorders, not elsewhere classified: Secondary | ICD-10-CM | POA: Diagnosis not present

## 2021-05-25 ENCOUNTER — Other Ambulatory Visit: Payer: Self-pay | Admitting: Family Medicine

## 2021-05-27 ENCOUNTER — Other Ambulatory Visit: Payer: Self-pay

## 2021-05-27 MED ORDER — VENTOLIN HFA 108 (90 BASE) MCG/ACT IN AERS: INHALATION_SPRAY | RESPIRATORY_TRACT | 0 refills | Status: DC

## 2021-05-28 ENCOUNTER — Other Ambulatory Visit: Payer: Self-pay | Admitting: Physician Assistant

## 2021-06-05 ENCOUNTER — Telehealth: Payer: Self-pay

## 2021-06-05 NOTE — Progress Notes (Signed)
Chronic Care Management Pharmacy Assistant   Name: LONIE NEWSHAM  MRN: [id number] DOB: [birth year]   Reason for Encounter: Medication Review for [place] delivery   Recent office visits:  05/02/21-[facility],    Recent consult visits:  05/19/21-[facility], follow up [facility]mos  Hospital visits:  None in previous 6 months  Medications: Outpatient Encounter Medications as of 06/05/2021  Medication Sig Note   acetaminophen (TYLENOL) 500 MG tablet Take 1,000 mg by mouth every 6 (six) hours as needed for moderate pain or headache.    amiodarone (PACERONE) 200 MG tablet Take 1 tablet (200 mg total) by mouth every evening.    apixaban (ELIQUIS) 5 MG TABS tablet Take 5 mg by mouth 2 (two) times daily.     b complex vitamins tablet Take 1 tablet by mouth daily.    BD PEN NEEDLE MICRO U/F 32G X 6 MM MISC SMARTSIG:1 Needle SUB-Q Daily    Budeson-Glycopyrrol-Formoterol (BREZTRI AEROSPHERE) 160-9-4.8 MCG/ACT AERO Inhale 2 puffs into the lungs 2 (two) times daily.    Catheters (FREEDOM CATH MALE EXT CATHETER) MISC 1 each by Does not apply route daily.    cholecalciferol (VITAMIN D3) 25 MCG (1000 UT) tablet Take 1,000 Units by mouth daily.     CINNAMON PO Take 700 mg by mouth daily.     dapagliflozin propanediol (FARXIGA) 10 MG TABS tablet Take 1 tablet (10 mg total) by mouth daily before breakfast.    folic acid (FOLVITE) 916 MCG tablet Take 800 mcg by mouth daily.    HYDROcodone-acetaminophen (NORCO) 10-325 MG tablet Take 1 tablet by mouth every 4 (four) hours as needed for moderate pain.    insulin degludec (TRESIBA FLEXTOUCH) 100 UNIT/ML FlexTouch Pen Inject 50 Units into the skin daily.    ipratropium-albuterol (DUONEB) 0.5-2.5 (3) MG/3ML SOLN Take 3 mLs by nebulization every 6 (six) hours as needed (shortness of breath).     levocetirizine (XYZAL) 5 MG tablet Take 5 mg by mouth daily as needed for allergies.    magnesium chloride (SLOW-MAG) 64 MG TBEC SR tablet Take 1 tablet  (64 mg total) by mouth 2 (two) times daily. (Patient taking differently: Take 1 tablet by mouth daily.)    metolazone (ZAROXOLYN) 5 MG tablet Take 5 mg by mouth once a week. Uses prn if swollen once weekly.    metoprolol tartrate (LOPRESSOR) 50 MG tablet Take 1 tablet (50 mg total) by mouth in the morning, at noon, and at bedtime.    montelukast (SINGULAIR) 10 MG tablet TAKE 1 TABLET BY MOUTH ONCE DAILY IN THE EVENING    mupirocin ointment (BACTROBAN) 2 % Apply 1 application topically 2 (two) times daily. (Patient not taking: Reported on 05/02/2021)    Omeprazole 20 MG TBEC Take 20 mg by mouth 2 (two) times daily before a meal.     potassium chloride SA (K-DUR) 20 MEQ tablet Take 20 mEq by mouth 2 (two) times daily.    pyridOXINE (VITAMIN B-6) 100 MG tablet Take 100 mg by mouth daily.     rOPINIRole (REQUIP) 0.5 MG tablet TAKE 1 TABLET BY MOUTH AT BEDTIME    rosuvastatin (CRESTOR) 20 MG tablet Take 1 tablet by mouth once daily    Semaglutide, 1 MG/DOSE, (OZEMPIC, 1 MG/DOSE,) 2 MG/1.5ML SOPN Inject 1 mg into the skin once a week. 05/02/2021: Patient taking 1 mg twice weekly right now until 2 mg dose available. Daughter divided doses due to burning per patient request.    sertraline (ZOLOFT)  50 MG tablet Take 1 tablet by mouth twice daily    sulfamethoxazole-trimethoprim (BACTRIM DS) 800-160 MG tablet Take 1 tablet by mouth 2 (two) times daily. (Patient not taking: No sig reported)    tamsulosin (FLOMAX) 0.4 MG CAPS capsule TAKE 1 CAPSULE BY MOUTH BEFORE BEDTIME    torsemide (DEMADEX) 20 MG tablet Take 1 tablet (20 mg total) by mouth 2 (two) times daily. Take an extra tablet (20 mg) in the afternoon on Monday, Wednesday, Friday. (Patient taking differently: Take 40 mg by mouth 2 (two) times daily. 2 in the am and 2 at noon)    VENTOLIN HFA 108 (90 Base) MCG/ACT inhaler INHALE 2 PUFFS BY MOUTH EVERY 4 HOURS AS NEEDED FOR WHEEZING OR SHORTNESS OF BREATH    No facility-administered encounter medications on  file as of 06/05/2021.   Hypertension/Heart Failure Medications Rate/Rhythm control: amiodarone 200 mg daily in the evening and metoprolol 50 mg morning, noon and bedtime Anticoagulation: Eliquis 5 mg bid torsemide 40 mg bid Metoprolol tartrate 50 mg tid Metolazone 5 mg once weekly if needed   Called about [place] shipment, representative stated the order was placed on 05/30/21 and it takes 10-15 days to receive medication.    Called the PCP office to see it they had samples to give, Suanne Marker is going to leave some in supply closet for the patient.  I have sent a text to his daughter Jenny Reichmann notifying her that the samples are at the office.  Spoke to patient and let him know that samples are at the office for his [name], he was glad due to him being out currently, let him know his shipment should arrive in 10/15 days.   He stated he only takes his blood pressure once in a while, he said that before his medication they normally run around 130/70.  He stated his blood sugars fasting are 145, and after meals they are around 180.  Patient denies any issues/side effects with his medications.  Gaps Foot Exam: 01/14/21 Wellness Exam: scheduled 10/28/21  [facility] Medications Metoprolol 03/20/21 90 Rosuvastatin 05/28/21 [place], [place] Pharmacist Assistant [phone]

## 2021-06-10 ENCOUNTER — Other Ambulatory Visit: Payer: Self-pay | Admitting: Cardiology

## 2021-06-10 ENCOUNTER — Other Ambulatory Visit: Payer: Self-pay | Admitting: Physician Assistant

## 2021-06-26 ENCOUNTER — Telehealth: Payer: Self-pay

## 2021-06-26 NOTE — Telephone Encounter (Signed)
Pharmacist informed patient's daughter.

## 2021-06-26 NOTE — Telephone Encounter (Signed)
Patient assistance for [place] received today. 1 of 2 Samples placed in fridge others placed in sample closet. Patient is not aware.

## 2021-06-28 NOTE — Telephone Encounter (Signed)
  Chronic Care Management   Note  06/28/2021 Name: Shawn Sullivan MRN: [id number] DOB: 05-24-1946  Patient's insulin arrived in office. Pharmacist informed daughter. Daughter will be by to pick up 06/27/2021 before lunch.   Sherre Poot, PharmD, [facility] Clinical Pharmacist [facility] [phone] (office) [phone] (mobile)

## 2021-07-02 ENCOUNTER — Encounter: Payer: Self-pay | Admitting: Cardiology

## 2021-07-02 ENCOUNTER — Ambulatory Visit: Payer: Medicare HMO | Admitting: Cardiology

## 2021-07-02 ENCOUNTER — Other Ambulatory Visit: Payer: Self-pay

## 2021-07-02 VITALS — BP 130/72 | HR 74 | Ht 72.0 in | Wt 322.0 lb

## 2021-07-02 DIAGNOSIS — Z7901 Long term (current) use of anticoagulants: Secondary | ICD-10-CM | POA: Diagnosis not present

## 2021-07-02 DIAGNOSIS — I480 Paroxysmal atrial fibrillation: Secondary | ICD-10-CM

## 2021-07-02 DIAGNOSIS — I130 Hypertensive heart and chronic kidney disease with heart failure and stage 1 through stage 4 chronic kidney disease, or unspecified chronic kidney disease: Secondary | ICD-10-CM | POA: Diagnosis not present

## 2021-07-02 DIAGNOSIS — I4510 Unspecified right bundle-branch block: Secondary | ICD-10-CM

## 2021-07-02 DIAGNOSIS — I5032 Chronic diastolic (congestive) heart failure: Secondary | ICD-10-CM

## 2021-07-02 DIAGNOSIS — I350 Nonrheumatic aortic (valve) stenosis: Secondary | ICD-10-CM | POA: Diagnosis not present

## 2021-07-02 DIAGNOSIS — Z79899 Other long term (current) drug therapy: Secondary | ICD-10-CM

## 2021-07-02 DIAGNOSIS — N183 Chronic kidney disease, stage 3 unspecified: Secondary | ICD-10-CM | POA: Diagnosis not present

## 2021-07-02 NOTE — Progress Notes (Signed)
Cardiology Office Note:    Date:  07/02/2021   ID:  Shawn Sullivan, DOB 10/25/1946, MRN [id number]  PCP:  Rochel Brome, MD  Cardiologist:  Shirlee More, MD    Referring MD: Rochel Brome, MD    ASSESSMENT:    1. PAF (paroxysmal atrial fibrillation) ([place])   2. Chronic anticoagulation   3. On amiodarone therapy   4. Right bundle branch block   5. Hypertensive heart and kidney disease with chronic diastolic congestive heart failure and stage 3 chronic kidney disease, unspecified whether stage 3a or 3b CKD ([place])   6. Nonrheumatic aortic valve stenosis    PLAN:    In order of problems listed above:  Fortunately he continues to maintain sinus rhythm on low-dose amiodarone beta-blocker and current anticoagulant continue the same check thyroid studies and CMP and thyroid test regarding toxicity  stable EKG pattern he does not have findings of progressive conduction system disease BP at target heart failure compensated continue his current diuretics loop proximal as well as T2 inhibitor and beta-blocker Stable likely needs a repeat echocardiogram in about 1 year  Next appointment: 6 months   Medication Adjustments/Labs and Tests Ordered: Current medicines are reviewed at length with the patient today.  Concerns regarding medicines are outlined above.  No orders of the defined types were placed in this encounter.  No orders of the defined types were placed in this encounter.   Chief Complaint  Patient presents with   Follow-up  Atrial fibrillation on amiodarone  History of Present Illness:    Shawn Sullivan is a 75 y.o. male with a hx of paroxysmal atrial fibrillation maintaining sinus rhythm on low-dose amiodarone anticoagulated, hypertensive heart and chronic kidney disease with diastolic heart failure and stage III CKD mild aortic stenosis and right bundle branch block.  He was last seen 12/31/2020.  Compliance with diet, lifestyle and medications: Yes  Echocardiogram  performed 09/11/2020 shows normal ejection fraction 60 to 65% moderate concentric LVH elevated left ventricular filling pressures and mild aortic stenosis with aortic valve calcification peak and mean gradients of 30 and 15 mmHg  Clinically continues to do well no indication of recurrent atrial fibrillation is no longer being screened by his daughter along with the cardio mobile device No edema shortness of breath chest pain palpitation or syncope Tolerates amiodarone without toxicity Remains anticoagulated without bleeding complication Past Medical History:  Diagnosis Date   Anticoagulated 07/01/2018   Anticoagulated 07/01/2018   Benign essential hypertension 05/06/2013   Chronic obstructive pulmonary disease ([place]) 11/05/2015   COPD (chronic obstructive pulmonary disease) ([place]) 07/01/2018   Dermatochalasis 12/16/2015   Disorder of bursae and tendons in shoulder region 01/05/2014   Generalized edema 06/22/2016   Hyperlipidemia 12/16/2015   Hypertensive heart disease 07/01/2018   Low back pain 08/17/2013   Lumbosacral spondylosis 05/06/2013   Obstructive sleep apnea 07/01/2018   Persistent atrial fibrillation ([place]) 07/01/2018   Piriformis syndrome 05/03/2014   Postlaminectomy syndrome, lumbar region 05/06/2013   Presbyopia of both eyes 12/16/2015   Restless legs syndrome 03/23/2016   Restrictive lung disease 06/22/2016   Sleep apnea 05/06/2013   Type 2 diabetes mellitus without complications ([place]) 99991111    Past Surgical History:  Procedure Laterality Date   APPENDECTOMY     BACK SURGERY     CARDIOVERSION N/A 12/01/2018   Procedure: CARDIOVERSION;  Surgeon: Sanda Klein, MD;  Location: [facility];  Service: Cardiovascular;  Laterality: N/A;   CATARACT EXTRACTION Bilateral    LUMBAR FUSION  NECK SURGERY      Current Medications: Current Meds  Medication Sig   acetaminophen (TYLENOL) 500 MG tablet Take 1,000 mg by mouth every 6 (six) hours as needed for moderate pain or headache.   amiodarone  (PACERONE) 200 MG tablet Take 1 tablet (200 mg total) by mouth every evening.   apixaban (ELIQUIS) 5 MG TABS tablet Take 5 mg by mouth 2 (two) times daily.    b complex vitamins tablet Take 1 tablet by mouth daily.   BD PEN NEEDLE MICRO U/F 32G X 6 MM MISC SMARTSIG:1 Needle SUB-Q Daily   Budeson-Glycopyrrol-Formoterol (BREZTRI AEROSPHERE) 160-9-4.8 MCG/ACT AERO Inhale 2 puffs into the lungs 2 (two) times daily.   cholecalciferol (VITAMIN D3) 25 MCG (1000 UT) tablet Take 1,000 Units by mouth daily.    CINNAMON PO Take 700 mg by mouth daily.    dapagliflozin propanediol (FARXIGA) 10 MG TABS tablet Take 1 tablet (10 mg total) by mouth daily before breakfast.   folic acid (FOLVITE) Q000111Q MCG tablet Take 800 mcg by mouth daily.   HYDROcodone-acetaminophen (NORCO) 10-325 MG tablet Take 1 tablet by mouth every 4 (four) hours as needed for moderate pain.   insulin degludec (TRESIBA FLEXTOUCH) 100 UNIT/ML FlexTouch Pen Inject 50 Units into the skin daily.   ipratropium-albuterol (DUONEB) 0.5-2.5 (3) MG/3ML SOLN Take 3 mLs by nebulization every 6 (six) hours as needed (shortness of breath).    levocetirizine (XYZAL) 5 MG tablet Take 5 mg by mouth daily as needed for allergies.   magnesium chloride (SLOW-MAG) 64 MG TBEC SR tablet Take 1 tablet by mouth daily.   metolazone (ZAROXOLYN) 5 MG tablet Take 5 mg by mouth once a week. Uses prn if swollen once weekly.   metoprolol tartrate (LOPRESSOR) 50 MG tablet TAKE 1 TABLET BY MOUTH IN THE MORNING, AT NOON AND AT BEDTIME   montelukast (SINGULAIR) 10 MG tablet TAKE 1 TABLET BY MOUTH ONCE DAILY IN THE EVENING   Omeprazole 20 MG TBEC Take 20 mg by mouth 2 (two) times daily before a meal.    Potassium Chloride ER 20 MEQ TBCR Take 1 tablet by mouth 2 (two) times daily.   rOPINIRole (REQUIP) 0.5 MG tablet TAKE 1 TABLET BY MOUTH AT BEDTIME   rosuvastatin (CRESTOR) 20 MG tablet Take 1 tablet by mouth once daily   Semaglutide (OZEMPIC, 1 MG/DOSE, [place]) Inject 1 mg into the  skin 2 (two) times a week.   sertraline (ZOLOFT) 50 MG tablet Take 1 tablet by mouth twice daily   sulfamethoxazole-trimethoprim (BACTRIM DS) 800-160 MG tablet Take 1 tablet by mouth 2 (two) times daily.   tamsulosin (FLOMAX) 0.4 MG CAPS capsule TAKE 1 CAPSULE BY MOUTH BEFORE BEDTIME   torsemide (DEMADEX) 20 MG tablet Take 40 mg by mouth 2 (two) times daily.   VENTOLIN HFA 108 (90 Base) MCG/ACT inhaler INHALE 2 PUFFS BY MOUTH EVERY 4 HOURS AS NEEDED FOR WHEEZING OR SHORTNESS OF BREATH     Allergies:   Androgel [testosterone], Biaxin [clarithromycin], Duloxetine, Gabapentin, Ibuprofen, Lyrica [pregabalin], and Spironolactone   Social History   Socioeconomic History   Marital status: Married    Spouse name: Not on file   Number of children: Not on file   Years of education: Not on file   Highest education level: Not on file  Occupational History   Not on file  Tobacco Use   Smoking status: Former    Types: Cigarettes    Quit date: 1993    Years since quitting:  29.6   Smokeless tobacco: Never  Vaping Use   Vaping Use: Never used  Substance and Sexual Activity   Alcohol use: Not Currently   Drug use: Not Currently    Types: Hydrocodone   Sexual activity: Not on file  Other Topics Concern   Not on file  Social History Narrative   Not on file   Social Determinants of Health   Financial Resource Strain: Not on file  Food Insecurity: No Food Insecurity   Worried About Running Out of Food in the Last Year: Never true   Ran Out of Food in the Last Year: Never true  Transportation Needs: Not on file  Physical Activity: Inactive   Days of Exercise per Week: 0 days   Minutes of Exercise per Session: 0 min  Stress: Not on file  Social Connections: Not on file     Family History: The patient's family history includes Atrial fibrillation in his mother; Cancer in his mother; Heart disease in his father. ROS:   Please see the history of present illness.    All other systems  reviewed and are negative.  EKGs/Labs/Other Studies Reviewed:    The following studies were reviewed today:  EKG:  EKG ordered today and personally reviewed.  The ekg ordered today demonstrates sinus rhythm right bundle branch block 1 PVC  Recent Labs: 12/10/2020: ALT 23; BUN 17; Creatinine, Ser 1.32; Hemoglobin 13.2; Platelets 270; Potassium 4.7; Sodium 137  Recent Lipid Panel    Component Value Date/Time   CHOL 126 12/10/2020 1342   TRIG 148 12/10/2020 1342   HDL 35 (L) 12/10/2020 1342   CHOLHDL 3.6 12/10/2020 1342   LDLCALC 65 12/10/2020 1342    Physical Exam:    VS:  BP 130/72 (BP Location: Right Arm, Patient Position: Sitting, Cuff Size: Normal)   Pulse 74   Ht 6' (1.829 m)   Wt (!) 322 lb (146.1 kg)   SpO2 93%   BMI 43.67 kg/m     Wt Readings from Last 3 Encounters:  07/02/21 (!) 322 lb (146.1 kg)  02/03/21 (!) 326 lb (147.9 kg)  01/14/21 (!) 328 lb (148.8 kg)     GEN: Marked obesity BMI approaches 44 well nourished, well developed in no acute distress HEENT: Normal NECK: No JVD; No carotid bruits LYMPHATICS: No lymphadenopathy CARDIAC: RRR, no murmurs, rubs, gallops RESPIRATORY:  Clear to auscultation without rales, wheezing or rhonchi  ABDOMEN: Soft, non-tender, non-distended MUSCULOSKELETAL:  No edema; No deformity  SKIN: Warm and dry NEUROLOGIC:  Alert and oriented x 3 PSYCHIATRIC:  Normal affect    Signed, Shirlee More, MD  07/02/2021 11:35 AM    [place]

## 2021-07-02 NOTE — Patient Instructions (Signed)
Medication Instructions:  Your physician recommends that you continue on your current medications as directed. Please refer to the Current Medication list given to you today.  *If you need a refill on your cardiac medications before your next appointment, please call your pharmacy*   Lab Work: Your physician recommends that you return for lab work in: [place], TSH, T3, T4 If you have labs (blood work) drawn today and your tests are completely normal, you will receive your results only by: [place] (if you have [place]) OR A paper copy in the mail If you have any lab test that is abnormal or we need to change your treatment, we will call you to review the results.   Testing/Procedures: None   Follow-Up: At [facility], you and your health needs are our priority.  As part of our continuing mission to provide you with exceptional heart care, we have created designated Provider Care Teams.  These Care Teams include your primary Cardiologist (physician) and Advanced Practice Providers (APPs -  Physician Assistants and Nurse Practitioners) who all work together to provide you with the care you need, when you need it.  We recommend signing up for the patient portal called "MyChart".  Sign up information is provided on this After Visit Summary.  MyChart is used to connect with patients for Virtual Visits (Telemedicine).  Patients are able to view lab/test results, encounter notes, upcoming appointments, etc.  Non-urgent messages can be sent to your provider as well.   To learn more about what you can do with MyChart, go to [email or web address].    Your next appointment:   6 month(s)  The format for your next appointment:   In Person  Provider:   Shirlee More, MD   Other Instructions

## 2021-07-03 LAB — TSH+T4F+T3FREE
Free T4: 1.44 ng/dL (ref 0.82–1.77)
T3, Free: 2.3 pg/mL (ref 2.0–4.4)
TSH: 2.67 u[IU]/mL (ref 0.450–4.500)

## 2021-07-03 LAB — COMPREHENSIVE METABOLIC PANEL
ALT: 15 IU/L (ref 0–44)
AST: 23 IU/L (ref 0–40)
Albumin/Globulin Ratio: 1.8 (ref 1.2–2.2)
Albumin: 4 g/dL (ref 3.7–4.7)
Alkaline Phosphatase: 125 IU/L — ABNORMAL HIGH (ref 44–121)
BUN/Creatinine Ratio: 11 (ref 10–24)
BUN: 15 mg/dL (ref 8–27)
Bilirubin Total: 0.5 mg/dL (ref 0.0–1.2)
CO2: 23 mmol/L (ref 20–29)
Calcium: 9.8 mg/dL (ref 8.6–10.2)
Chloride: 101 mmol/L (ref 96–106)
Creatinine, Ser: 1.38 mg/dL — ABNORMAL HIGH (ref 0.76–1.27)
Globulin, Total: 2.2 g/dL (ref 1.5–4.5)
Glucose: 104 mg/dL — ABNORMAL HIGH (ref 65–99)
Potassium: 4.9 mmol/L (ref 3.5–5.2)
Sodium: 139 mmol/L (ref 134–144)
Total Protein: 6.2 g/dL (ref 6.0–8.5)
eGFR: 53 mL/min/{1.73_m2} — ABNORMAL LOW (ref >59)

## 2021-07-04 ENCOUNTER — Telehealth: Payer: Self-pay

## 2021-07-04 DIAGNOSIS — R030 Elevated blood-pressure reading, without diagnosis of hypertension: Secondary | ICD-10-CM | POA: Diagnosis not present

## 2021-07-04 DIAGNOSIS — L0390 Cellulitis, unspecified: Secondary | ICD-10-CM | POA: Diagnosis not present

## 2021-07-04 NOTE — Telephone Encounter (Signed)
Spoke with patient regarding results and recommendation.  Patient verbalizes understanding and is agreeable to plan of care. Advised patient to call back with any issues or concerns.  

## 2021-07-04 NOTE — Telephone Encounter (Signed)
-----   Message from Richardo Priest, MD sent at 07/03/2021  8:08 AM EDT ----- Normal or stable result  Good result continue amiodarone no changes

## 2021-07-08 ENCOUNTER — Telehealth: Payer: Self-pay

## 2021-07-08 DIAGNOSIS — L0390 Cellulitis, unspecified: Secondary | ICD-10-CM | POA: Diagnosis not present

## 2021-07-08 DIAGNOSIS — R030 Elevated blood-pressure reading, without diagnosis of hypertension: Secondary | ICD-10-CM | POA: Diagnosis not present

## 2021-07-08 NOTE — Chronic Care Management (AMB) (Signed)
Chronic Care Management Pharmacy Assistant   Name: Shawn Sullivan  MRN: [id number] DOB: Nov 05, 1946  Reason for Encounter: Diabetes Mellitus Disease State Call   Recent office visits:  No visits noted  Recent consult visits:  07/02/21- Shirlee More, MD (Cardiology)- seen for follow-up of atrial fibrillation on amiodarone, visit noted patient taking the following medications differently than prescribed: magnesium 64 mg daily, semaglutide 1 mg twice weekly, torsemide 40 mg twice daily, labs ordered,follow up 6 months  Hospital visits:  None in previous 6 months  Medications: Outpatient Encounter Medications as of 07/08/2021  Medication Sig   acetaminophen (TYLENOL) 500 MG tablet Take 1,000 mg by mouth every 6 (six) hours as needed for moderate pain or headache.   amiodarone (PACERONE) 200 MG tablet Take 1 tablet (200 mg total) by mouth every evening.   apixaban (ELIQUIS) 5 MG TABS tablet Take 5 mg by mouth 2 (two) times daily.    b complex vitamins tablet Take 1 tablet by mouth daily.   BD PEN NEEDLE MICRO U/F 32G X 6 MM MISC SMARTSIG:1 Needle SUB-Q Daily   Budeson-Glycopyrrol-Formoterol (BREZTRI AEROSPHERE) 160-9-4.8 MCG/ACT AERO Inhale 2 puffs into the lungs 2 (two) times daily.   cholecalciferol (VITAMIN D3) 25 MCG (1000 UT) tablet Take 1,000 Units by mouth daily.    CINNAMON PO Take 700 mg by mouth daily.    dapagliflozin propanediol (FARXIGA) 10 MG TABS tablet Take 1 tablet (10 mg total) by mouth daily before breakfast.   folic acid (FOLVITE) [id number] MCG tablet Take 800 mcg by mouth daily.   HYDROcodone-acetaminophen (NORCO) 10-325 MG tablet Take 1 tablet by mouth every 4 (four) hours as needed for moderate pain.   insulin degludec (TRESIBA FLEXTOUCH) 100 UNIT/ML FlexTouch Pen Inject 50 Units into the skin daily.   ipratropium-albuterol (DUONEB) 0.5-2.5 (3) MG/3ML SOLN Take 3 mLs by nebulization every 6 (six) hours as needed (shortness of breath).    levocetirizine (XYZAL) 5 MG  tablet Take 5 mg by mouth daily as needed for allergies.   magnesium chloride (SLOW-MAG) 64 MG TBEC SR tablet Take 1 tablet by mouth daily.   metolazone (ZAROXOLYN) 5 MG tablet Take 5 mg by mouth once a week. Uses prn if swollen once weekly.   metoprolol tartrate (LOPRESSOR) 50 MG tablet TAKE 1 TABLET BY MOUTH IN THE MORNING, AT NOON AND AT BEDTIME   montelukast (SINGULAIR) 10 MG tablet TAKE 1 TABLET BY MOUTH ONCE DAILY IN THE EVENING   Omeprazole 20 MG TBEC Take 20 mg by mouth 2 (two) times daily before a meal.    Potassium Chloride ER 20 MEQ TBCR Take 1 tablet by mouth 2 (two) times daily.   rOPINIRole (REQUIP) 0.5 MG tablet TAKE 1 TABLET BY MOUTH AT BEDTIME   rosuvastatin (CRESTOR) 20 MG tablet Take 1 tablet by mouth once daily   Semaglutide (OZEMPIC, 1 MG/DOSE, [place]) Inject 1 mg into the skin 2 (two) times a week.   sertraline (ZOLOFT) 50 MG tablet Take 1 tablet by mouth twice daily   sulfamethoxazole-trimethoprim (BACTRIM DS) 800-160 MG tablet Take 1 tablet by mouth 2 (two) times daily.   tamsulosin (FLOMAX) 0.4 MG CAPS capsule TAKE 1 CAPSULE BY MOUTH BEFORE BEDTIME   torsemide (DEMADEX) 20 MG tablet Take 40 mg by mouth 2 (two) times daily.   VENTOLIN HFA 108 (90 Base) MCG/ACT inhaler INHALE 2 PUFFS BY MOUTH EVERY 4 HOURS AS NEEDED FOR WHEEZING OR SHORTNESS OF BREATH   No facility-administered encounter medications on file  as of 07/08/2021.   Recent Relevant Labs: Lab Results  Component Value Date/Time   HGBA1C 9.4 (H) 12/10/2020 01:42 PM   HGBA1C 12.0 (H) 05/31/2020 10:48 AM   MICROALBUR 80 01/14/2021 11:53 AM   MICROALBUR 150 05/31/2020 11:09 AM    Kidney Function Lab Results  Component Value Date/Time   CREATININE 1.38 (H) 07/02/2021 11:42 AM   CREATININE 1.32 (H) 12/10/2020 01:42 PM   GFRNONAA 52 (L) 12/10/2020 01:42 PM   GFRAA 60 12/10/2020 01:42 PM    Current antihyperglycemic regimen:  Farxiga 10 mg daily Tresiba 50 units daily Ozempic 2 mg weekly - patient taking 1 mg  Wed. And Sat.   Patient verbally confirms he is taking the above medications as directed. Yes  What recent interventions/DTPs have been made to improve glycemic control:  No recent interventions  Have there been any recent hospitalizations or ED visits since last visit with CPP? No  Patient denies hypoglycemic symptoms  Patient denies hyperglycemic symptoms  How often are you checking your blood sugar?  Patient checks his BS daily  What are your blood sugars ranging?  Fasting: 117,120 Before meals:  After meals: 175 Bedtime:   On insulin? Yes How many units: 50 U  During the week, how often does your blood glucose drop below 70? Never  Are you checking your feet daily/regularly?  Patient stated he checks his feet regularly and also has it checked by his PCP  Adherence Review: Is the patient currently on a STATIN medication? Yes Is the patient currently on ACE/ARB medication? No Does the patient have >5 day gap between last estimated fill dates?   Care Gaps: Last eye exam / Retinopathy Screening? None Noted Last Annual Wellness Visit? Upcoming 10/28/21 Last Diabetic Foot Exam? None noted   Star Rating Drugs:  Medication:  Last Fill: Day Supply Farxiga 10 mg  Patient reported he recently had a supply delivered in the last two weeks Ozempic 1 mg  Rosuvastatin 20 mg 05/28/21 90 DS  [facility] CPA, CMA

## 2021-07-21 ENCOUNTER — Other Ambulatory Visit: Payer: Self-pay | Admitting: Family Medicine

## 2021-08-15 ENCOUNTER — Telehealth: Payer: Medicare HMO

## 2021-08-18 DIAGNOSIS — M533 Sacrococcygeal disorders, not elsewhere classified: Secondary | ICD-10-CM | POA: Diagnosis not present

## 2021-08-18 DIAGNOSIS — M961 Postlaminectomy syndrome, not elsewhere classified: Secondary | ICD-10-CM | POA: Diagnosis not present

## 2021-08-18 DIAGNOSIS — G894 Chronic pain syndrome: Secondary | ICD-10-CM | POA: Diagnosis not present

## 2021-08-27 ENCOUNTER — Other Ambulatory Visit: Payer: Self-pay | Admitting: Physician Assistant

## 2021-08-27 DIAGNOSIS — E669 Obesity, unspecified: Secondary | ICD-10-CM | POA: Diagnosis not present

## 2021-08-27 DIAGNOSIS — E876 Hypokalemia: Secondary | ICD-10-CM | POA: Diagnosis not present

## 2021-08-27 DIAGNOSIS — N189 Chronic kidney disease, unspecified: Secondary | ICD-10-CM | POA: Diagnosis not present

## 2021-08-27 DIAGNOSIS — D631 Anemia in chronic kidney disease: Secondary | ICD-10-CM | POA: Diagnosis not present

## 2021-08-27 DIAGNOSIS — I129 Hypertensive chronic kidney disease with stage 1 through stage 4 chronic kidney disease, or unspecified chronic kidney disease: Secondary | ICD-10-CM | POA: Diagnosis not present

## 2021-08-27 DIAGNOSIS — N2581 Secondary hyperparathyroidism of renal origin: Secondary | ICD-10-CM | POA: Diagnosis not present

## 2021-08-27 DIAGNOSIS — N1832 Chronic kidney disease, stage 3b: Secondary | ICD-10-CM | POA: Diagnosis not present

## 2021-08-27 DIAGNOSIS — R600 Localized edema: Secondary | ICD-10-CM | POA: Diagnosis not present

## 2021-08-27 DIAGNOSIS — E1122 Type 2 diabetes mellitus with diabetic chronic kidney disease: Secondary | ICD-10-CM | POA: Diagnosis not present

## 2021-08-29 ENCOUNTER — Ambulatory Visit (INDEPENDENT_AMBULATORY_CARE_PROVIDER_SITE_OTHER): Payer: Medicare HMO

## 2021-08-29 DIAGNOSIS — E0842 Diabetes mellitus due to underlying condition with diabetic polyneuropathy: Secondary | ICD-10-CM

## 2021-08-29 DIAGNOSIS — J449 Chronic obstructive pulmonary disease, unspecified: Secondary | ICD-10-CM

## 2021-08-29 DIAGNOSIS — E1121 Type 2 diabetes mellitus with diabetic nephropathy: Secondary | ICD-10-CM | POA: Diagnosis not present

## 2021-08-29 DIAGNOSIS — E119 Type 2 diabetes mellitus without complications: Secondary | ICD-10-CM | POA: Diagnosis not present

## 2021-08-29 DIAGNOSIS — I482 Chronic atrial fibrillation, unspecified: Secondary | ICD-10-CM | POA: Diagnosis not present

## 2021-08-29 DIAGNOSIS — Z794 Long term (current) use of insulin: Secondary | ICD-10-CM | POA: Diagnosis not present

## 2021-08-29 NOTE — Progress Notes (Signed)
Chronic Care Management Pharmacy Note  08/29/2021 Name:  Shawn Sullivan MRN:  [id number] DOB:  Sep 07, 1946   Plan Recommendations:  Patient hasn't been seen, asked front desk to make appointment with PCP for labs and visit Sugars are very non-controlled, will call monthly to get readings to get A1C< 8, daughter didn't have sugar logs today Will renew PAP in December Unable to conduct PHQ9 due to talking with patient's daughter, she also didn't have BP nor Sugar readings Microalbumin>30 (It was 80), candidate for ACE/ARB  Subjective: Shawn Sullivan is an 75 y.o. year old male who is a primary patient of Cox, Kirsten, MD.  The CCM team was consulted for assistance with disease management and care coordination needs.    Engaged with patient by telephone for follow up visit in response to provider referral for pharmacy case management and/or care coordination services.   Consent to Services:  The patient was given information about Chronic Care Management services, agreed to services, and gave verbal consent prior to initiation of services.  Please see initial visit note for detailed documentation.   Patient Care Team: Rochel Brome, MD as PCP - General (Family Medicine) Rosita Fire, MD as Consulting Physician (Nephrology) Burnice Logan, [facility] (Inactive) as Pharmacist (Pharmacist)  Recent office visits: 01/14/2021 - healthy diet encouraged. Microalbumin 80.   Recent consult visits: 01/06/2021 - Pain management - injection.  12/31/2020 - Chest x-ray due to amiodarone. No evidence of thyroid toxicity or liver. Stable EKG. Stable BP at targe heart failure compensated and stable chronic kidney disease. Mild aortic stenosis asymptomatic. Repeate echo 1-2 years.   Hospital visits: None in previous 6 months  Objective:  Lab Results  Component Value Date   CREATININE 1.38 (H) 07/02/2021   BUN 15 07/02/2021   GFRNONAA 52 (L) 12/10/2020   GFRAA 60 12/10/2020   NA 139  07/02/2021   K 4.9 07/02/2021   CALCIUM 9.8 07/02/2021   CO2 23 07/02/2021    Lab Results  Component Value Date/Time   HGBA1C 9.4 (H) 12/10/2020 01:42 PM   HGBA1C 12.0 (H) 05/31/2020 10:48 AM   MICROALBUR 80 01/14/2021 11:53 AM   MICROALBUR 150 05/31/2020 11:09 AM    Last diabetic Eye exam: No results found for: HMDIABEYEEXA  Last diabetic Foot exam: No results found for: HMDIABFOOTEX   Lab Results  Component Value Date   CHOL 126 12/10/2020   HDL 35 (L) 12/10/2020   LDLCALC 65 12/10/2020   TRIG 148 12/10/2020   CHOLHDL 3.6 12/10/2020    Hepatic Function Latest Ref Rng & Units 07/02/2021 12/10/2020 07/18/2020  Total Protein 6.0 - 8.5 g/dL 6.2 6.5 -  Albumin 3.7 - 4.7 g/dL 4.0 3.9 3.7  AST 0 - 40 IU/L 23 35 -  ALT 0 - 44 IU/L 15 23 -  Alk Phosphatase 44 - 121 IU/L 125(H) 173(H) -  Total Bilirubin 0.0 - 1.2 mg/dL 0.5 0.7 -    Lab Results  Component Value Date/Time   TSH 2.670 07/02/2021 11:42 AM   FREET4 1.44 07/02/2021 11:42 AM    CBC Latest Ref Rng & Units 12/10/2020 07/18/2020 05/31/2020  WBC 3.4 - 10.8 x10E3/uL 12.1(H) 11.3(H) 10.9(H)  Hemoglobin 13.0 - 17.7 g/dL 13.2 14.2 15.0  Hematocrit 37.5 - 51.0 % 41.4 44.9 46.1  Platelets 150 - 450 x10E3/uL 270 308 211    Lab Results  Component Value Date/Time   VD25OH 29.5 (L) 07/18/2020 02:32 PM   VD25OH 23.5 (L) 03/19/2020 01:45 PM  Clinical ASCVD: No  The ASCVD Risk score (Arnett DK, et al., 2019) failed to calculate for the following reasons:   The valid total cholesterol range is 130 to 320 mg/dL    Depression screen [facility] 2/9 01/14/2021 01/14/2018 07/15/2017  Decreased Interest 0 0 0  Down, Depressed, Hopeless 1 0 0  PHQ - 2 Score 1 0 0  Altered sleeping 3 - -  Tired, decreased energy 3 - -  Change in appetite 3 - -  Feeling bad or failure about yourself  1 - -  Trouble concentrating 1 - -  Moving slowly or fidgety/restless 2 - -  Suicidal thoughts 0 - -  PHQ-9 Score 14 - -  Difficult doing work/chores  Somewhat difficult - -     Social History   Tobacco Use  Smoking Status Former   Types: Cigarettes   Quit date: 1993   Years since quitting: 29.7  Smokeless Tobacco Never   BP Readings from Last 3 Encounters:  07/02/21 130/72  02/03/21 (!) 149/92  01/14/21 128/84   Pulse Readings from Last 3 Encounters:  07/02/21 74  02/03/21 71  01/14/21 74   Wt Readings from Last 3 Encounters:  07/02/21 (!) 322 lb (146.1 kg)  02/03/21 (!) 326 lb (147.9 kg)  01/14/21 (!) 328 lb (148.8 kg)    Assessment/Interventions: Review of patient past medical history, allergies, medications, health status, including review of consultants reports, laboratory and other test data, was performed as part of comprehensive evaluation and provision of chronic care management services.   SDOH:  (Social Determinants of Health) assessments and interventions performed: Yes   CCM Care Plan  Allergies  Allergen Reactions   Androgel [Testosterone] Other (See Comments)    Pedal edema    Biaxin [Clarithromycin] Other (See Comments)    Taste perception alteration   Duloxetine Other (See Comments)    Hallucinations   Gabapentin Other (See Comments)    hallucinations   Ibuprofen Other (See Comments)    GI upset    Lyrica [Pregabalin] Swelling   Spironolactone Other (See Comments)    Medications Reviewed Today     Reviewed by Anselm Pancoast, CMA (Certified Medical Assistant) on 07/02/21 at 6  Med List Status: <None>   Medication Order Taking? Sig Documenting Provider Last Dose Status Informant  acetaminophen (TYLENOL) 500 MG tablet [id number] Yes Take 1,000 mg by mouth every 6 (six) hours as needed for moderate pain or headache. [provider] Taking Active Family Member  amiodarone (PACERONE) 200 MG tablet [id number] Yes Take 1 tablet (200 mg total) by mouth every evening. Richardo Priest, MD Taking Active   apixaban (ELIQUIS) 5 MG TABS tablet [id number] Yes Take 5 mg by mouth 2 (two) times  daily.  [provider] Taking Active Family Member  b complex vitamins tablet [id number] Yes Take 1 tablet by mouth daily. [provider] Taking Active   BD PEN NEEDLE MICRO U/F 32G X 6 MM MISC [id number] Yes SMARTSIG:1 Needle SUB-Q Daily [provider] Taking Active   Budeson-Glycopyrrol-Formoterol (BREZTRI AEROSPHERE) 160-9-4.8 MCG/ACT AERO [id number] Yes Inhale 2 puffs into the lungs 2 (two) times daily. Cox, Kirsten, MD Taking Active   cholecalciferol (VITAMIN D3) 25 MCG (1000 UT) tablet [id number] Yes Take 1,000 Units by mouth daily.  [provider] Taking Active   CINNAMON PO [id number] Yes Take 700 mg by mouth daily.  [provider] Taking Active Family Member  dapagliflozin propanediol (FARXIGA) 10 MG TABS tablet [id number] Yes Take  1 tablet (10 mg total) by mouth daily before breakfast. Cox, Kirsten, MD Taking Active   folic acid (FOLVITE) 671 MCG tablet [id number] Yes Take 800 mcg by mouth daily. [provider] Taking Active   HYDROcodone-acetaminophen (NORCO) 10-325 MG tablet [id number] Yes Take 1 tablet by mouth every 4 (four) hours as needed for moderate pain. [provider] Taking Active   insulin degludec (TRESIBA FLEXTOUCH) 100 UNIT/ML FlexTouch Pen [id number] Yes Inject 50 Units into the skin daily. [provider] Taking Active   ipratropium-albuterol (DUONEB) 0.5-2.5 (3) MG/3ML SOLN [id number] Yes Take 3 mLs by nebulization every 6 (six) hours as needed (shortness of breath).  [provider] Taking Active Family Member  levocetirizine (XYZAL) 5 MG tablet [id number] Yes Take 5 mg by mouth daily as needed for allergies. [provider] Taking Active Family Member  magnesium chloride (SLOW-MAG) 64 MG TBEC SR tablet [id number] Yes Take 1 tablet by mouth daily. [provider] Taking Active   metolazone (ZAROXOLYN) 5 MG tablet [id number] Yes Take 5 mg by mouth once a week. Uses prn if swollen once  weekly. [provider] Taking Active   metoprolol tartrate (LOPRESSOR) 50 MG tablet [id number] Yes TAKE 1 TABLET BY MOUTH IN THE MORNING, AT NOON AND AT BEDTIME Richardo Priest, MD Taking Active   montelukast (SINGULAIR) 10 MG tablet [id number] Yes TAKE 1 TABLET BY MOUTH ONCE DAILY IN THE Julio Alm, Kirsten, MD Taking Active   Omeprazole 20 MG TBEC [id number] Yes Take 20 mg by mouth 2 (two) times daily before a meal.  [provider] Taking Active Family Member  Potassium Chloride ER 20 MEQ TBCR [id number] Yes Take 1 tablet by mouth 2 (two) times daily. [provider] Taking Active   rOPINIRole (REQUIP) 0.5 MG tablet [id number] Yes TAKE 1 TABLET BY MOUTH AT BEDTIME Cox, Kirsten, MD Taking Active   rosuvastatin (CRESTOR) 20 MG tablet [id number] Yes Take 1 tablet by mouth once daily Marge Duncans, PA-C Taking Active   Semaglutide [facility], 1 MG/DOSE, [place]) [id number] Yes Inject 1 mg into the skin 2 (two) times a week. [provider] Taking Active   sertraline (ZOLOFT) 50 MG tablet [id number] Yes Take 1 tablet by mouth twice daily Marge Duncans, PA-C Taking Active   sulfamethoxazole-trimethoprim (BACTRIM DS) 800-160 MG tablet [id number] Yes Take 1 tablet by mouth 2 (two) times daily. Cox, Kirsten, MD Taking Active   tamsulosin (FLOMAX) 0.4 MG CAPS capsule [id number] Yes TAKE 1 CAPSULE BY MOUTH BEFORE BEDTIME Cox, Kirsten, MD Taking Active   torsemide (DEMADEX) 20 MG tablet [id number] Yes Take 40 mg by mouth 2 (two) times daily. [provider] Taking Active   VENTOLIN HFA 108 (90 Base) MCG/ACT inhaler [id number] Yes INHALE 2 PUFFS BY MOUTH EVERY 4 HOURS AS NEEDED FOR WHEEZING OR SHORTNESS OF Jacques Earthly, MD Taking Active             Patient Active Problem List   Diagnosis Date Noted   Acute sinusitis 10/17/2020   Orchitis 07/18/2020   Testicular pain, right 07/15/2020   Atrial fibrillation, chronic ([place]) 05/31/2020   Anxiety 05/31/2020   BMI 45.0-49.9,  adult ([place]) 04/11/2020   Balanitis 04/11/2020   Diabetes mellitus due to underlying condition, controlled, with diabetic polyneuropathy, with long-term current use of insulin ([place]) 02/23/2020   Gastroesophageal reflux disease without esophagitis 02/23/2020   Chronic anticoagulation 06/14/2019   Hypertensive heart and kidney disease with chronic diastolic congestive heart failure and stage 3 chronic kidney disease (  [place]) 06/14/2019   Persistent atrial fibrillation ([place]) 06/14/2019   Medication management 04/19/2019   On amiodarone therapy 03/24/2019   Polyneuropathy associated with underlying disease ([place]) 10/11/2018   Hypokalemia 10/04/2018   PAF (paroxysmal atrial fibrillation) ([place]) 07/01/2018   Anticoagulated 07/01/2018   CKD (chronic kidney disease) stage 3, GFR 30-59 ml/min (HCC) 07/01/2018   COPD (chronic obstructive pulmonary disease) ([place]) 07/01/2018   Obstructive sleep apnea 07/01/2018   Hypertensive heart disease 07/01/2018   Edema of both lower extremities 06/20/2018   Shortness of breath 06/20/2018   Obesity, morbid, BMI 40.0-49.9 ([place]) 10/04/2017   Chronic pain syndrome 08/30/2017   Restrictive lung disease 06/22/2016   Generalized edema 06/22/2016   Restless legs syndrome 03/23/2016   Presbyopia of both eyes 12/16/2015   Hyperlipidemia 12/16/2015   Bilateral pseudophakia 12/16/2015   Dermatochalasis 12/16/2015   Chronic obstructive pulmonary disease ([place]) 11/05/2015   Type 2 diabetes mellitus without complications ([place]) 93/73/4287   Edema 11/04/2015   Piriformis syndrome 05/03/2014   Disorder of bursae and tendons in shoulder region 01/05/2014   Low back pain 08/17/2013   Sleep apnea 05/06/2013   Postlaminectomy syndrome, lumbar region 05/06/2013   Lumbosacral spondylosis 05/06/2013   Depressive disorder 05/06/2013   Chronic pain 05/06/2013   Benign essential hypertension 05/06/2013    Immunization History  Administered Date(s) Administered   Fluad Quad(high  Dose 65+) 10/11/2020   Influenza-Unspecified 08/16/2019   Moderna SARS-COV2 Booster Vaccination 12/10/2020   Moderna Sars-Covid-2 Vaccination 12/22/2019, 01/19/2020   Pneumococcal Conjugate-13 07/26/2014   Pneumococcal Polysaccharide-23 08/19/2012    Conditions to be addressed/monitored:  Hypertension, Hyperlipidemia, Diabetes, Atrial Fibrillation, Heart Failure, GERD, COPD, Chronic Kidney Disease and Restless Leg Syndrome  Care Plan : [place]  Updates made by Lane Hacker, [place] since 08/29/2021 12:00 AM     Problem: dm, afib, chf, hld   Priority: High  Onset Date: 02/07/2021     Long-Range Goal: Disease Management   Start Date: 02/07/2021  Expected End Date: 02/07/2022  Recent Progress: On track  Priority: High  Note:    Current Barriers:  Unable to achieve control of diabetes   Pharmacist Clinical Goal(s):  Over the next 90 days, patient will verbalize ability to afford treatment regimen achieve adherence to monitoring guidelines and medication adherence to achieve therapeutic efficacy achieve control of diabetes as evidenced by a1c through collaboration with PharmD and provider.   Interventions: 1:1 collaboration with Rochel Brome, MD regarding development and update of comprehensive plan of care as evidenced by provider attestation and co-signature Inter-disciplinary care team collaboration (see longitudinal plan of care) Comprehensive medication review performed; medication list updated in electronic medical record  Hyperlipidemia: (LDL goal < 70 Lab Results  Component Value Date   CHOL 126 12/10/2020   CHOL 153 05/31/2020   CHOL 139 02/23/2020   Lab Results  Component Value Date   HDL 35 (L) 12/10/2020   HDL 36 (L) 05/31/2020   HDL 36 (L) 02/23/2020   Lab Results  Component Value Date   LDLCALC 65 12/10/2020   LDLCALC 87 05/31/2020   LDLCALC 76 02/23/2020   Lab Results  Component Value Date   TRIG 148 12/10/2020   TRIG 173 (H)  05/31/2020   TRIG 158 (H) 02/23/2020   Lab Results  Component Value Date   CHOLHDL 3.6 12/10/2020   CHOLHDL 4.3 05/31/2020   CHOLHDL 3.9 02/23/2020  No results found for: LDLDIRECT -Controlled -Current treatment: Rosuvastatin 20 mg daily  -Medications previously tried: atorvastatin  -  Current dietary patterns: daughter tries to get some healthy options when she cooks for them.  -Current exercise habits: minimal due to mobility and back pain  -Educated on Cholesterol goals;  Benefits of statin for ASCVD risk reduction; Importance of limiting foods high in cholesterol; -Counseled on diet and exercise extensively Recommended to continue current medication  Diabetes (A1c goal <7%) Lab Results  Component Value Date   HGBA1C 9.4 (H) 12/10/2020   HGBA1C 12.0 (H) 05/31/2020   HGBA1C 9.0 (H) 02/23/2020   Lab Results  Component Value Date   MICROALBUR 80 01/14/2021   LDLCALC 65 12/10/2020   CREATININE 1.38 (H) 07/02/2021   Lab Results  Component Value Date   NA 139 07/02/2021   K 4.9 07/02/2021   CREATININE 1.38 (H) 07/02/2021   EGFR 53 (L) 07/02/2021   GFRNONAA 52 (L) 12/10/2020   GLUCOSE 104 (H) 07/02/2021   Lab Results  Component Value Date   WBC 12.1 (H) 12/10/2020   HGB 13.2 12/10/2020   HCT 41.4 12/10/2020   MCV 87 12/10/2020   PLT 270 12/10/2020  -Uncontrolled -Current medications: Farxiga 10 mg daily (PAP) Tresiba 50 units daily (PAP) Ozempic 2 mg weekly (PAP) -Medications previously tried:  glipizide, metformin, Soliqua -Current home glucose readings fasting glucose:  September 2022: Daughter doesn't have logs with her post prandial glucose: not reproted -Denies hypoglycemic/hyperglycemic symptoms -Current meal patterns:  Patient sneaks cookies and snacks. Daughter tries to prepare a balanced meal when cooking for patient. Patient's appetite fluctuates per daughter.  -Current exercise: minimal  -Educated on A1c and blood sugar goals; Complications of  diabetes including kidney damage, retinal damage, and cardiovascular disease; Benefits of routine self-monitoring of blood sugar; -Counseled to check feet daily and get yearly eye exams -Counseled on diet and exercise extensively September 2022: Patient needs A1c. Daughter will call and schedule ASAP, I will also verify with front desk. Will have concierge check in monthly (Daughter affirmed plan) to get these sugars under control. Patient is a candidate for Metformin if further medication needed  Atrial Fibrillation (Goal: prevent stroke and major bleeding) -Controlled -CHADSVASC: 5 -Current treatment: Rate/Rhythm control: amiodarone 200 mg daily in the evening and metoprolol 50 mg morning, noon and bedtime  Anticoagulation: Eliquis 5 mg bid  (PAP) -Medications previously tried: none reported -Home BP and HR readings: fluctuates  -Counseled on increased risk of stroke due to Afib and benefits of anticoagulation for stroke prevention; importance of adherence to anticoagulant exactly as prescribed; Pharmacist will coordinate patient assistance application once 3% spent on medications.   -Counseled on diet and exercise extensively Recommended to continue current medication September 2022: Will renew PAP in December 2022  Heart Failure (Goal: manage symptoms and prevent exacerbations) -Controlled -Last ejection fraction: 60-65% (Date: 10/21) -HF type: Diastolic -Current treatment: torsemide 40 mg bid  Metoprolol tartrate 50 mg tid Metolazone 5 mg once weekly if needed -Medications previously tried: furosemide  -Current home BP/HR readings: ~130/80 -Current dietary habits: daughter tries to provide healthy options  -Current exercise habits: limited by mobility and pain  -Educated on Benefits of medications for managing symptoms and prolonging life Importance of blood pressure control -Counseled on diet and exercise extensively Recommended to continue current medication  COPD (Goal:  control symptoms and prevent exacerbations) -Controlled -Current treatment  Breztri 2 puffs bid (PAP) Ipratropium 3 mls every 6 hours prn  Montelukast 10 mg daily  Ventolin 2 puffs every 4 hours prn wheezing or shortness of breath -Medications previously tried: trelegy  -Patient  reports consistent use of maintenance inhaler -Frequency of rescue inhaler use: weekly  -Counseled on Proper inhaler technique; Benefits of consistent maintenance inhaler use September 2022: Will renew PAP in December 2022  Meds: Daughter didn't have meds with her today so unable to verify as reviewed on EPIC   Patient Goals/Self-Care Activities Over the next 90 days, patient will:  - take medications as prescribed focus on medication adherence by using pill box check glucose twice daily, document, and provide at future appointments check blood pressure daily, document, and provide at future appointments  Follow Up Plan: Telephone follow up appointment with care management team member scheduled for: March 2023 and monthly from Concierge      Medication Assistance:  Pilar Grammes, [place], Tresibaobtained through [facility] and Me and NovoNordisk  medication assistance program.  Enrollment ends 11/29/2021  , will renew for 2023  Patient's preferred pharmacy is:  [place], [place] - [address] [place] [address] Phone: [phone] Fax: [phone]  [facility] - [place], [place] - [address] [place] [address] Phone: [phone] Fax: [phone]  [place], [place] [address] [place] [address] Phone: [phone] Fax: [phone], [place] (New Address) - [place], [place] AT Previously: [address] [place] [place]  [address] Phone: [phone] Fax: [address] [place] [address] Phone: [phone] Fax: [phone]  Uses pill box? Yes Pt endorses good compliance  We discussed: Benefits of medication synchronization, packaging and delivery as well as enhanced pharmacist oversight with [employer]. Patient decided to: Continue current medication management strategy  Care Plan and Follow Up Patient Decision:  Patient agrees to Care Plan and Follow-up.  Plan: Telephone follow up appointment with care management team member scheduled for:  March 2023

## 2021-08-29 NOTE — Patient Instructions (Signed)
Visit Information   Goals Addressed   None    Patient Care Plan: CCM Pharmacy Care Plan     Problem Identified: dm, afib, chf, hld   Priority: High  Onset Date: 02/07/2021     Long-Range Goal: Disease Management   Start Date: 02/07/2021  Expected End Date: 02/07/2022  Recent Progress: On track  Priority: High  Note:    Current Barriers:  Unable to achieve control of diabetes   Pharmacist Clinical Goal(s):  Over the next 90 days, patient will verbalize ability to afford treatment regimen achieve adherence to monitoring guidelines and medication adherence to achieve therapeutic efficacy achieve control of diabetes as evidenced by a1c through collaboration with PharmD and provider.   Interventions: 1:1 collaboration with Rochel Brome, MD regarding development and update of comprehensive plan of care as evidenced by provider attestation and co-signature Inter-disciplinary care team collaboration (see longitudinal plan of care) Comprehensive medication review performed; medication list updated in electronic medical record  Hyperlipidemia: (LDL goal < 70 Lab Results  Component Value Date   CHOL 126 12/10/2020   CHOL 153 05/31/2020   CHOL 139 02/23/2020   Lab Results  Component Value Date   HDL 35 (L) 12/10/2020   HDL 36 (L) 05/31/2020   HDL 36 (L) 02/23/2020   Lab Results  Component Value Date   LDLCALC 65 12/10/2020   LDLCALC 87 05/31/2020   LDLCALC 76 02/23/2020   Lab Results  Component Value Date   TRIG 148 12/10/2020   TRIG 173 (H) 05/31/2020   TRIG 158 (H) 02/23/2020   Lab Results  Component Value Date   CHOLHDL 3.6 12/10/2020   CHOLHDL 4.3 05/31/2020   CHOLHDL 3.9 02/23/2020  No results found for: LDLDIRECT -Controlled -Current treatment: Rosuvastatin 20 mg daily  -Medications previously tried: atorvastatin  -Current dietary patterns: daughter tries to get some healthy options when she cooks for them.  -Current exercise habits: minimal due to  mobility and back pain  -Educated on Cholesterol goals;  Benefits of statin for ASCVD risk reduction; Importance of limiting foods high in cholesterol; -Counseled on diet and exercise extensively Recommended to continue current medication  Diabetes (A1c goal <7%) Lab Results  Component Value Date   HGBA1C 9.4 (H) 12/10/2020   HGBA1C 12.0 (H) 05/31/2020   HGBA1C 9.0 (H) 02/23/2020   Lab Results  Component Value Date   MICROALBUR 80 01/14/2021   LDLCALC 65 12/10/2020   CREATININE 1.38 (H) 07/02/2021   Lab Results  Component Value Date   NA 139 07/02/2021   K 4.9 07/02/2021   CREATININE 1.38 (H) 07/02/2021   EGFR 53 (L) 07/02/2021   GFRNONAA 52 (L) 12/10/2020   GLUCOSE 104 (H) 07/02/2021   Lab Results  Component Value Date   WBC 12.1 (H) 12/10/2020   HGB 13.2 12/10/2020   HCT 41.4 12/10/2020   MCV 87 12/10/2020   PLT 270 12/10/2020  -Uncontrolled -Current medications: Farxiga 10 mg daily (PAP) Tresiba 50 units daily (PAP) Ozempic 2 mg weekly (PAP) -Medications previously tried:  glipizide, metformin, Soliqua -Current home glucose readings fasting glucose:  September 2022: Daughter doesn't have logs with her post prandial glucose: not reproted -Denies hypoglycemic/hyperglycemic symptoms -Current meal patterns:  Patient sneaks cookies and snacks. Daughter tries to prepare a balanced meal when cooking for patient. Patient's appetite fluctuates per daughter.  -Current exercise: minimal  -Educated on A1c and blood sugar goals; Complications of diabetes including kidney damage, retinal damage, and cardiovascular disease; Benefits of routine self-monitoring of blood  sugar; -Counseled to check feet daily and get yearly eye exams -Counseled on diet and exercise extensively September 2022: Patient needs A1c. Daughter will call and schedule ASAP, I will also verify with front desk. Will have concierge check in monthly (Daughter affirmed plan) to get these sugars under  control. Patient is a candidate for Metformin if further medication needed -Candidate for ACE/ARB due to Microalbumin: 80  Atrial Fibrillation (Goal: prevent stroke and major bleeding) -Controlled -CHADSVASC: 5 -Current treatment: Rate/Rhythm control: amiodarone 200 mg daily in the evening and metoprolol 50 mg morning, noon and bedtime  Anticoagulation: Eliquis 5 mg bid  (PAP) -Medications previously tried: none reported -Home BP and HR readings: fluctuates  -Counseled on increased risk of stroke due to Afib and benefits of anticoagulation for stroke prevention; importance of adherence to anticoagulant exactly as prescribed; Pharmacist will coordinate patient assistance application once 3% spent on medications.   -Counseled on diet and exercise extensively Recommended to continue current medication September 2022: Will renew PAP in December 2022  Heart Failure (Goal: manage symptoms and prevent exacerbations) -Controlled -Last ejection fraction: 60-65% (Date: 10/21) -HF type: Diastolic -Current treatment: torsemide 40 mg bid  Metoprolol tartrate 50 mg tid Metolazone 5 mg once weekly if needed -Medications previously tried: furosemide  -Current home BP/HR readings: ~130/80 -Current dietary habits: daughter tries to provide healthy options  -Current exercise habits: limited by mobility and pain  -Educated on Benefits of medications for managing symptoms and prolonging life Importance of blood pressure control -Counseled on diet and exercise extensively Recommended to continue current medication  COPD (Goal: control symptoms and prevent exacerbations) -Controlled -Current treatment  Breztri 2 puffs bid (PAP) Ipratropium 3 mls every 6 hours prn  Montelukast 10 mg daily  Ventolin 2 puffs every 4 hours prn wheezing or shortness of breath -Medications previously tried: trelegy  -Patient reports consistent use of maintenance inhaler -Frequency of rescue inhaler use: weekly   -Counseled on Proper inhaler technique; Benefits of consistent maintenance inhaler use September 2022: Will renew PAP in December 2022  Meds: Daughter didn't have meds with her today so unable to verify as reviewed on EPIC   Patient Goals/Self-Care Activities Over the next 90 days, patient will:  - take medications as prescribed focus on medication adherence by using pill box check glucose twice daily, document, and provide at future appointments check blood pressure daily, document, and provide at future appointments  Follow Up Plan: Telephone follow up appointment with care management team member scheduled for: March 2023 and monthly from [place] patient verbalized understanding of instructions, educational materials, and care plan provided today and declined offer to receive copy of patient instructions, educational materials, and care plan.  The pharmacy team will reach out to the patient again over the next 30 days.  PharmD F/U March 2023  Lane Hacker, [name]

## 2021-09-02 ENCOUNTER — Telehealth: Payer: Self-pay

## 2021-09-02 NOTE — Telephone Encounter (Signed)
Cindy Day called stating pt's patient assistance for [name] was sent back. Pt is needing samples until receives new shipment. New shipment is currently being processed. States she was advised by pharmacist to receive samples until shipment.   Pt currently taking farxiga 10 mg once daily. Samples were found. Cindy aware. Samples signed out in book and placed in front office for pt to pick up.   Royce Macadamia, [place] 09/02/21 9:42 AM

## 2021-09-07 ENCOUNTER — Other Ambulatory Visit: Payer: Self-pay | Admitting: Physician Assistant

## 2021-09-08 ENCOUNTER — Other Ambulatory Visit: Payer: Self-pay

## 2021-09-08 MED ORDER — VENTOLIN HFA 108 (90 BASE) MCG/ACT IN AERS: INHALATION_SPRAY | RESPIRATORY_TRACT | 0 refills | Status: DC

## 2021-09-09 ENCOUNTER — Encounter: Payer: Self-pay | Admitting: Nurse Practitioner

## 2021-09-09 ENCOUNTER — Ambulatory Visit (INDEPENDENT_AMBULATORY_CARE_PROVIDER_SITE_OTHER): Payer: Medicare HMO | Admitting: Nurse Practitioner

## 2021-09-09 ENCOUNTER — Other Ambulatory Visit: Payer: Self-pay | Admitting: Nurse Practitioner

## 2021-09-09 ENCOUNTER — Ambulatory Visit (INDEPENDENT_AMBULATORY_CARE_PROVIDER_SITE_OTHER): Payer: Medicare HMO

## 2021-09-09 VITALS — BP 132/76 | HR 75 | Temp 97.5°F | Ht 71.0 in | Wt 319.0 lb

## 2021-09-09 DIAGNOSIS — N1831 Chronic kidney disease, stage 3a: Secondary | ICD-10-CM

## 2021-09-09 DIAGNOSIS — Z794 Long term (current) use of insulin: Secondary | ICD-10-CM

## 2021-09-09 DIAGNOSIS — I5032 Chronic diastolic (congestive) heart failure: Secondary | ICD-10-CM

## 2021-09-09 DIAGNOSIS — E782 Mixed hyperlipidemia: Secondary | ICD-10-CM | POA: Diagnosis not present

## 2021-09-09 DIAGNOSIS — Z23 Encounter for immunization: Secondary | ICD-10-CM

## 2021-09-09 DIAGNOSIS — E119 Type 2 diabetes mellitus without complications: Secondary | ICD-10-CM | POA: Diagnosis not present

## 2021-09-09 DIAGNOSIS — E1165 Type 2 diabetes mellitus with hyperglycemia: Secondary | ICD-10-CM

## 2021-09-09 DIAGNOSIS — I10 Essential (primary) hypertension: Secondary | ICD-10-CM

## 2021-09-09 DIAGNOSIS — I130 Hypertensive heart and chronic kidney disease with heart failure and stage 1 through stage 4 chronic kidney disease, or unspecified chronic kidney disease: Secondary | ICD-10-CM | POA: Diagnosis not present

## 2021-09-09 DIAGNOSIS — E559 Vitamin D deficiency, unspecified: Secondary | ICD-10-CM

## 2021-09-09 DIAGNOSIS — L989 Disorder of the skin and subcutaneous tissue, unspecified: Secondary | ICD-10-CM

## 2021-09-09 NOTE — Progress Notes (Signed)
.  Subjective:  Patient ID: Shawn Sullivan, male    DOB: [birth year]  Age: 75 y.o. MRN: [id number]  Chief Complaint  Patient presents with   Gastroesophageal Reflux   Diabetes   Hyperlipidemia   Hypertension    HPI   Shawn Sullivan is a 75 year old Caucasian male that presents for follow-up of Type 2 diabetes mellitus, hypertension, hyperlipidemia, and GERD. He is accompanied by his spouse and adult daughter who helps care for his complex medical conditions. He has OSA, non-adherent with CPAP. Has CKD stage 3a, and anemia of chronic disease, followed by [facility]. Shawn Sullivan has chronic a-fib with heart failure, currently treated with Amiodarone 200 mg, Eliquis 5 BID, and Torsemide BID. He is followed by Dr Bettina Gavia, cardiologist. Shawn Sullivan denies falls, excessive bruising, or bleeding. He has a chronic non-healing skin lesion to left cheek that has been present for several years. He has requested a dermatology referral for further evaluation.   Diabetes Mellitus Type II, Follow-up  Lab Results  Component Value Date   HGBA1C 9.4 (H) 12/10/2020   HGBA1C 12.0 (H) 05/31/2020   HGBA1C 9.0 (H) 02/23/2020   Wt Readings from Last 3 Encounters:  09/09/21 (!) 319 lb (144.7 kg)  07/02/21 (!) 322 lb (146.1 kg)  02/03/21 (!) 326 lb (147.9 kg)   Last seen for diabetes 3 months ago.  Management since then includes  tresiba 50 units QD, farxiga 20 mg QD, Ozempic 1 mg weekly. He reports excellent compliance with treatment. He is not having side effects.  Symptoms: No fatigue No foot ulcerations  No appetite changes No nausea  Yes paresthesia of the feet  Yes polydipsia  Yes polyuria No visual disturbances   No vomiting     Home blood sugar records:  130s-140s     Episodes of hypoglycemia? No  Current insulin regiment: Tresiba 50 units daily Most Recent Eye Exam: unknown, overdue Current exercise: none Diet: Diabetic, low sodium, 1.5 L fluid restriction   Pertinent Labs: Lab  Results  Component Value Date   CHOL 126 12/10/2020   HDL 35 (L) 12/10/2020   LDLCALC 65 12/10/2020   TRIG 148 12/10/2020   CHOLHDL 3.6 12/10/2020   Lab Results  Component Value Date   NA 139 07/02/2021   K 4.9 07/02/2021   CREATININE 1.38 (H) 07/02/2021   EGFR 53 (L) 07/02/2021   GFRNONAA 52 (L) 12/10/2020   GLUCOSE 104 (H) 07/02/2021       Hypertension,follow-up: He was last seen for hypertension 8 months ago.  BP at that visit was 128/84. Management since that visit includes Metoprolol 50 mg TID He reports excellent compliance with treatment. He is not having side effects.  He is following a Diabetic diet. He is exercising, does some yard work, works in his shop He does not smoke.     Use of agents associated with hypertension: none.   Outside blood pressures are 130s/70s. Symptoms: No chest pain No chest pressure  No palpitations No syncope  No dyspnea No orthopnea  No paroxysmal nocturnal dyspnea No lower extremity edema    Lipid/Cholesterol, Follow-up  Last lipid panel Other pertinent labs  Lab Results  Component Value Date   CHOL 126 12/10/2020   HDL 35 (L) 12/10/2020   LDLCALC 65 12/10/2020   TRIG 148 12/10/2020   CHOLHDL 3.6 12/10/2020   Lab Results  Component Value Date   ALT 15 07/02/2021   AST 23 07/02/2021   PLT 270 12/10/2020   TSH 2.670 07/02/2021  He was last seen for this 8 months ago.  Management since that visit includes Crestor 20 mg. He reports excellent compliance with treatment. He is not having side effects.   Symptoms: No chest pain No chest pressure/discomfort  Yes dyspnea with exertion No lower extremity edema  No numbness or tingling of extremity No orthopnea  No palpitations No paroxysmal nocturnal dyspnea  No speech difficulty No syncope   Current diet: in general, a diabetic diet   Current exercise: yard work The ASCVD Risk score (Arnett DK, et al., 2019) failed to calculate for the following reasons:   The valid  total cholesterol range is 130 to 320 mg/dL   Pertinent labs: Lab Results  Component Value Date   CHOL 126 12/10/2020   HDL 35 (L) 12/10/2020   LDLCALC 65 12/10/2020   TRIG 148 12/10/2020   CHOLHDL 3.6 12/10/2020   Lab Results  Component Value Date   NA 139 07/02/2021   K 4.9 07/02/2021   CREATININE 1.38 (H) 07/02/2021   EGFR 53 (L) 07/02/2021   GFRNONAA 52 (L) 12/10/2020   GLUCOSE 104 (H) 07/02/2021     The ASCVD Risk score (Arnett DK, et al., 2019) failed to calculate for the following reasons:   The valid total cholesterol range is 130 to 320 mg/dL    GERD, Follow up:  The patient was last seen for GERD 3 months ago. Current treatment includes Omeprazole 20 mg BID He reports excellent compliance with treatment. He is not having side effects. [name]     He IS experiencing  no symptoms . He is NOT experiencing bilious reflux    Current Outpatient Medications on File Prior to Visit  Medication Sig Dispense Refill   acetaminophen (TYLENOL) 500 MG tablet Take 1,000 mg by mouth every 6 (six) hours as needed for moderate pain or headache.     amiodarone (PACERONE) 200 MG tablet Take 1 tablet (200 mg total) by mouth every evening. 90 tablet 3   apixaban (ELIQUIS) 5 MG TABS tablet Take 5 mg by mouth 2 (two) times daily.      b complex vitamins tablet Take 1 tablet by mouth daily.     BD PEN NEEDLE MICRO U/F 32G X 6 MM MISC SMARTSIG:1 Needle SUB-Q Daily     Budeson-Glycopyrrol-Formoterol (BREZTRI AEROSPHERE) 160-9-4.8 MCG/ACT AERO Inhale 2 puffs into the lungs 2 (two) times daily. 10.7 g 0   cholecalciferol (VITAMIN D3) 25 MCG (1000 UT) tablet Take 1,000 Units by mouth daily.      CINNAMON PO Take 700 mg by mouth daily.      dapagliflozin propanediol (FARXIGA) 10 MG TABS tablet Take 1 tablet (10 mg total) by mouth daily before breakfast. 90 tablet 3   folic acid (FOLVITE) 161 MCG tablet Take 800 mcg by mouth daily.     HYDROcodone-acetaminophen (NORCO) 10-325 MG tablet Take 1 tablet  by mouth every 4 (four) hours as needed for moderate pain.     insulin degludec (TRESIBA FLEXTOUCH) 100 UNIT/ML FlexTouch Pen Inject 50 Units into the skin daily.     ipratropium-albuterol (DUONEB) 0.5-2.5 (3) MG/3ML SOLN Take 3 mLs by nebulization every 6 (six) hours as needed (shortness of breath).      levocetirizine (XYZAL) 5 MG tablet Take 5 mg by mouth daily as needed for allergies.     magnesium chloride (SLOW-MAG) 64 MG TBEC SR tablet Take 1 tablet by mouth daily.     metolazone (ZAROXOLYN) 5 MG tablet Take 5 mg by mouth  once a week. Uses prn if swollen once weekly.     metoprolol tartrate (LOPRESSOR) 50 MG tablet TAKE 1 TABLET BY MOUTH IN THE MORNING, AT NOON AND AT BEDTIME 270 tablet 2   montelukast (SINGULAIR) 10 MG tablet TAKE 1 TABLET BY MOUTH ONCE DAILY IN THE EVENING 90 tablet 0   Omeprazole 20 MG TBEC Take 20 mg by mouth 2 (two) times daily before a meal.      Potassium Chloride ER 20 MEQ TBCR Take 1 tablet by mouth 2 (two) times daily.     rOPINIRole (REQUIP) 0.5 MG tablet TAKE 1 TABLET BY MOUTH AT BEDTIME 30 tablet 2   rosuvastatin (CRESTOR) 20 MG tablet Take 1 tablet by mouth once daily 90 tablet 0   Semaglutide (OZEMPIC, 1 MG/DOSE, [place]) Inject 1 mg into the skin 2 (two) times a week.     sertraline (ZOLOFT) 50 MG tablet Take 1 tablet by mouth twice daily 180 tablet 0   sulfamethoxazole-trimethoprim (BACTRIM DS) 800-160 MG tablet Take 1 tablet by mouth 2 (two) times daily. 14 tablet 0   tamsulosin (FLOMAX) 0.4 MG CAPS capsule TAKE 1 CAPSULE BY MOUTH BEFORE BEDTIME 90 capsule 3   torsemide (DEMADEX) 20 MG tablet Take 40 mg by mouth 2 (two) times daily.     VENTOLIN HFA 108 (90 Base) MCG/ACT inhaler INHALE 2 PUFFS BY MOUTH EVERY 4 HOURS AS NEEDED FOR WHEEZING OR SHORTNESS OF BREATH 18 g 0   No current facility-administered medications on file prior to visit.   Past Medical History:  Diagnosis Date   Anticoagulated 07/01/2018   Anticoagulated 07/01/2018   Benign essential  hypertension 05/06/2013   Chronic obstructive pulmonary disease ([place]) 11/05/2015   COPD (chronic obstructive pulmonary disease) ([place]) 07/01/2018   Dermatochalasis 12/16/2015   Disorder of bursae and tendons in shoulder region 01/05/2014   Generalized edema 06/22/2016   Hyperlipidemia 12/16/2015   Hypertensive heart disease 07/01/2018   Low back pain 08/17/2013   Lumbosacral spondylosis 05/06/2013   Obstructive sleep apnea 07/01/2018   Persistent atrial fibrillation ([place]) 07/01/2018   Piriformis syndrome 05/03/2014   Postlaminectomy syndrome, lumbar region 05/06/2013   Presbyopia of both eyes 12/16/2015   Restless legs syndrome 03/23/2016   Restrictive lung disease 06/22/2016   Sleep apnea 05/06/2013   Type 2 diabetes mellitus without complications ([place]) 46/07/6294   Past Surgical History:  Procedure Laterality Date   APPENDECTOMY     BACK SURGERY     CARDIOVERSION N/A 12/01/2018   Procedure: CARDIOVERSION;  Surgeon: Sanda Klein, MD;  Location: [facility];  Service: Cardiovascular;  Laterality: N/A;   CATARACT EXTRACTION Bilateral    LUMBAR FUSION     NECK SURGERY      Family History  Problem Relation Age of Onset   Atrial fibrillation Mother    Cancer Mother    Heart disease Father    Social History   Socioeconomic History   Marital status: Married    Spouse name: Not on file   Number of children: Not on file   Years of education: Not on file   Highest education level: Not on file  Occupational History   Not on file  Tobacco Use   Smoking status: Former    Types: Cigarettes    Quit date: 1993    Years since quitting: 29.7   Smokeless tobacco: Never  Vaping Use   Vaping Use: Never used  Substance and Sexual Activity   Alcohol use: Not Currently   Drug use: Not  Currently    Types: Hydrocodone   Sexual activity: Not on file  Other Topics Concern   Not on file  Social History Narrative   Not on file   Social Determinants of Health   Financial Resource Strain: Not on file  Food  Insecurity: Not on file  Transportation Needs: Not on file  Physical Activity: Inactive   Days of Exercise per Week: 0 days   Minutes of Exercise per Session: 0 min  Stress: Not on file  Social Connections: Not on file    Review of Systems  Constitutional:  Positive for fatigue. Negative for chills, diaphoresis and fever.  HENT:  Positive for postnasal drip and rhinorrhea. Negative for congestion, ear pain and sore throat.   Respiratory:  Negative for cough and shortness of breath.   Cardiovascular:  Negative for chest pain and leg swelling.  Gastrointestinal:  Negative for abdominal pain, constipation, diarrhea, nausea and vomiting.  Endocrine: Positive for polydipsia, polyphagia and polyuria.  Genitourinary:  Negative for dysuria and urgency.  Musculoskeletal:  Positive for arthralgias, back pain and myalgias.  Skin:        Chronic skin lesion left cheek  Allergic/Immunologic: Positive for environmental allergies.  Neurological:  Positive for weakness. Negative for dizziness and headaches.  Hematological:  Bruises/bleeds easily.  Psychiatric/Behavioral:  Negative for dysphoric mood.     Objective:  Pulse 75   Temp (!) 97.5 F (36.4 C)   Ht 5' 11"  (1.803 m)   Wt (!) 319 lb (144.7 kg)   SpO2 99%   BMI 44.49 kg/m  BP 132/76   Pulse 75   Temp (!) 97.5 F (36.4 C)   Ht 5' 11"  (1.803 m)   Wt (!) 319 lb (144.7 kg)   SpO2 99%   BMI 44.49 kg/m    BP/Weight 09/09/2021 01/30/3556 01/30/2024  Systolic BP - 427 062  Diastolic BP - 72 92  Wt. (Lbs) 319 322 326  BMI 44.49 43.67 44.21    Physical Exam Vitals reviewed.  Constitutional:      Appearance: He is obese.  HENT:     Head: Normocephalic.     Right Ear: Tympanic membrane normal.     Left Ear: Tympanic membrane normal.     Nose: Congestion and rhinorrhea present.     Mouth/Throat:     Mouth: Mucous membranes are moist.     Pharynx: Posterior oropharyngeal erythema present.  Eyes:     Pupils: Pupils are equal,  round, and reactive to light.  Cardiovascular:     Rate and Rhythm: Normal rate. Rhythm irregular.     Pulses: Normal pulses.     Heart sounds: Normal heart sounds.  Pulmonary:     Effort: Pulmonary effort is normal.     Breath sounds: Normal breath sounds.  Abdominal:     General: Bowel sounds are normal.     Palpations: Abdomen is soft.  Musculoskeletal:        General: Normal range of motion.     Cervical back: Neck supple.  Skin:    General: Skin is warm and dry.     Capillary Refill: Capillary refill takes less than 2 seconds.     Findings: Lesion present.          Comments: Non-healing skin lesion to left cheek  Neurological:     General: No focal deficit present.     Mental Status: He is alert and oriented to person, place, and time.  Psychiatric:  Mood and Affect: Mood normal.        Behavior: Behavior normal.   Lab Results  Component Value Date   WBC 12.1 (H) 12/10/2020   HGB 13.2 12/10/2020   HCT 41.4 12/10/2020   PLT 270 12/10/2020   GLUCOSE 104 (H) 07/02/2021   CHOL 126 12/10/2020   TRIG 148 12/10/2020   HDL 35 (L) 12/10/2020   LDLCALC 65 12/10/2020   ALT 15 07/02/2021   AST 23 07/02/2021   NA 139 07/02/2021   K 4.9 07/02/2021   CL 101 07/02/2021   CREATININE 1.38 (H) 07/02/2021   BUN 15 07/02/2021   CO2 23 07/02/2021   TSH 2.670 07/02/2021   INR 1.1 11/25/2018   HGBA1C 9.4 (H) 12/10/2020   MICROALBUR 80 01/14/2021      Assessment & Plan:   .1. Type 2 diabetes mellitus with hyperglycemia, with long-term current use of insulin (HCC)-not at goal - CBC with Differential/Platelet - Hemoglobin A1c - Lipid panel -continue Ozempic 1 mg weekly injection, Farxiga 5 mg, and Tresiba 50 Units daily  2. Mixed hyperlipidemia-at goal - Lipid panel -Crestor 20 mg daily  3. Hypertensive heart and kidney disease with chronic diastolic congestive heart failure and stage 3a chronic kidney disease ([place]) - Comprehensive metabolic panel  4. Essential  hypertension-well controlled -continue Metoprolol 50 mg TID  5. Vitamin D deficiency-not at goal -continue Vit D supplement  6. Non-healing skin lesion - Ambulatory referral to Dermatology  7. Need for immunization against influenza - Flu Vaccine QUAD High Dose(Fluad)    Continue medications Heart healthy diet Increase physical activity Follow-up in 3-month, fasting with Dr CTobie Poet     Follow-up: 355-monthfasting with Dr CoTobie PoetAn After Visit Summary was printed and given to the patient.  I, ShRip HarbourNP, have reviewed all documentation for this visit. The documentation on 09/09/21 for the exam, diagnosis, procedures, and orders are all accurate and complete.   Signed, ShRip HarbourNP [phone]

## 2021-09-09 NOTE — Patient Instructions (Signed)
Continue medications Heart healthy diet Increase physical activity Follow-up in 67-months, fasting with Dr Tobie Poet   Diabetes Mellitus and [place] care is an important part of your health, especially when you have diabetes. Diabetes may cause you to have problems because of poor blood flow (circulation) to your feet and legs, which can cause your skin to: Become thinner and drier. Break more easily. Heal more slowly. Peel and crack. You may also have nerve damage (neuropathy) in your legs and feet, causing decreased feeling in them. This means that you may not notice minor injuries to your feet that could lead to more serious problems. Noticing and addressing any potential problems early is the best way to prevent future foot problems. How to care for your feet Foot hygiene  Wash your feet daily with warm water and mild soap. Do not use hot water. Then, pat your feet and the areas between your toes until they are completely dry. Do not soak your feet as this can dry your skin. Trim your toenails straight across. Do not dig under them or around the cuticle. File the edges of your nails with an emery board or nail file. Apply a moisturizing lotion or petroleum jelly to the skin on your feet and to dry, brittle toenails. Use lotion that does not contain alcohol and is unscented. Do not apply lotion between your toes. Shoes and socks Wear clean socks or stockings every day. Make sure they are not too tight. Do not wear knee-high stockings since they may decrease blood flow to your legs. Wear shoes that fit properly and have enough cushioning. Always look in your shoes before you put them on to be sure there are no objects inside. To break in new shoes, wear them for just a few hours a day. This prevents injuries on your feet. Wounds, scrapes, corns, and calluses  Check your feet daily for blisters, cuts, bruises, sores, and redness. If you cannot see the bottom of your feet, use a mirror or  ask someone for help. Do not cut corns or calluses or try to remove them with medicine. If you find a minor scrape, cut, or break in the skin on your feet, keep it and the skin around it clean and dry. You may clean these areas with mild soap and water. Do not clean the area with peroxide, alcohol, or iodine. If you have a wound, scrape, corn, or callus on your foot, look at it several times a day to make sure it is healing and not infected. Check for: Redness, swelling, or pain. Fluid or blood. Warmth. Pus or a bad smell. General tips Do not cross your legs. This may decrease blood flow to your feet. Do not use heating pads or hot water bottles on your feet. They may burn your skin. If you have lost feeling in your feet or legs, you may not know this is happening until it is too late. Protect your feet from hot and cold by wearing shoes, such as at the beach or on hot pavement. Schedule a complete foot exam at least once a year (annually) or more often if you have foot problems. Report any cuts, sores, or bruises to your health care provider immediately. Where to find more information American Diabetes Association: www.diabetes.org Association of Diabetes Care & Education Specialists: www.diabeteseducator.org Contact a health care provider if: You have a medical condition that increases your risk of infection and you have any cuts, sores, or bruises on your feet.  You have an injury that is not healing. You have redness on your legs or feet. You feel burning or tingling in your legs or feet. You have pain or cramps in your legs and feet. Your legs or feet are numb. Your feet always feel cold. You have pain around any toenails. Get help right away if: You have a wound, scrape, corn, or callus on your foot and: You have pain, swelling, or redness that gets worse. You have fluid or blood coming from the wound, scrape, corn, or callus. Your wound, scrape, corn, or callus feels warm to the  touch. You have pus or a bad smell coming from the wound, scrape, corn, or callus. You have a fever. You have a red line going up your leg. Summary Check your feet every day for blisters, cuts, bruises, sores, and redness. Apply a moisturizing lotion or petroleum jelly to the skin on your feet and to dry, brittle toenails. Wear shoes that fit properly and have enough cushioning. If you have foot problems, report any cuts, sores, or bruises to your health care provider immediately. Schedule a complete foot exam at least once a year (annually) or more often if you have foot problems. This information is not intended to replace advice given to you by your health care provider. Make sure you discuss any questions you have with your health care provider. Document Revised: 06/06/2020 Document Reviewed: 06/06/2020 Elsevier Patient Education  [place].

## 2021-09-09 NOTE — Progress Notes (Signed)
   Covid-19 Vaccination Clinic  Name:  MARSELINO SLAYTON    MRN: [id number] DOB: 03-19-1946  09/09/2021  Mr. Mortellaro was observed post Covid-19 immunization for 15 minutes without incident. He was provided with Vaccine Information Sheet and instruction to access the V-Safe system.   Mr. Kendra was instructed to call 911 with any severe reactions post vaccine: Difficulty breathing  Swelling of face and throat  A fast heartbeat  A bad rash all over body  Dizziness and weakness

## 2021-09-10 LAB — COMPREHENSIVE METABOLIC PANEL
ALT: 17 IU/L (ref 0–44)
AST: 22 IU/L (ref 0–40)
Albumin/Globulin Ratio: 2.1 (ref 1.2–2.2)
Albumin: 4.2 g/dL (ref 3.7–4.7)
Alkaline Phosphatase: 130 IU/L — ABNORMAL HIGH (ref 44–121)
BUN/Creatinine Ratio: 9 — ABNORMAL LOW (ref 10–24)
BUN: 14 mg/dL (ref 8–27)
Bilirubin Total: 0.8 mg/dL (ref 0.0–1.2)
CO2: 26 mmol/L (ref 20–29)
Calcium: 10.1 mg/dL (ref 8.6–10.2)
Chloride: 104 mmol/L (ref 96–106)
Creatinine, Ser: 1.57 mg/dL — ABNORMAL HIGH (ref 0.76–1.27)
Globulin, Total: 2 g/dL (ref 1.5–4.5)
Glucose: 102 mg/dL — ABNORMAL HIGH (ref 70–99)
Potassium: 5.2 mmol/L (ref 3.5–5.2)
Sodium: 142 mmol/L (ref 134–144)
Total Protein: 6.2 g/dL (ref 6.0–8.5)
eGFR: 45 mL/min/{1.73_m2} — ABNORMAL LOW (ref >59)

## 2021-09-10 LAB — CBC WITH DIFFERENTIAL/PLATELET
Basophils Absolute: 0.1 10*3/uL (ref 0.0–0.2)
Basos: 1 %
EOS (ABSOLUTE): 0.2 10*3/uL (ref 0.0–0.4)
Eos: 2 %
Hematocrit: 41.7 % (ref 37.5–51.0)
Hemoglobin: 12.8 g/dL — ABNORMAL LOW (ref 13.0–17.7)
Immature Grans (Abs): 0 10*3/uL (ref 0.0–0.1)
Immature Granulocytes: 0 %
Lymphocytes Absolute: 1.9 10*3/uL (ref 0.7–3.1)
Lymphs: 17 %
MCH: 25.8 pg — ABNORMAL LOW (ref 26.6–33.0)
MCHC: 30.7 g/dL — ABNORMAL LOW (ref 31.5–35.7)
MCV: 84 fL (ref 79–97)
Monocytes Absolute: 0.8 10*3/uL (ref 0.1–0.9)
Monocytes: 8 %
Neutrophils Absolute: 8.1 10*3/uL — ABNORMAL HIGH (ref 1.4–7.0)
Neutrophils: 72 %
Platelets: 191 10*3/uL (ref 150–450)
RBC: 4.96 x10E6/uL (ref 4.14–5.80)
RDW: 16.1 % — ABNORMAL HIGH (ref 11.6–15.4)
WBC: 11.1 10*3/uL — ABNORMAL HIGH (ref 3.4–10.8)

## 2021-09-10 LAB — LIPID PANEL
Chol/HDL Ratio: 4.3 ratio (ref 0.0–5.0)
Cholesterol, Total: 149 mg/dL (ref 100–199)
HDL: 35 mg/dL — ABNORMAL LOW (ref >39)
LDL Chol Calc (NIH): 80 mg/dL (ref 0–99)
Triglycerides: 203 mg/dL — ABNORMAL HIGH (ref 0–149)
VLDL Cholesterol Cal: 34 mg/dL (ref 5–40)

## 2021-09-10 LAB — HEMOGLOBIN A1C
Est. average glucose Bld gHb Est-mCnc: 137 mg/dL
Hgb A1c MFr Bld: 6.4 % — ABNORMAL HIGH (ref 4.8–5.6)

## 2021-09-10 LAB — CARDIOVASCULAR RISK ASSESSMENT

## 2021-09-11 DIAGNOSIS — M533 Sacrococcygeal disorders, not elsewhere classified: Secondary | ICD-10-CM | POA: Diagnosis not present

## 2021-09-24 DIAGNOSIS — C44319 Basal cell carcinoma of skin of other parts of face: Secondary | ICD-10-CM | POA: Diagnosis not present

## 2021-09-24 DIAGNOSIS — L578 Other skin changes due to chronic exposure to nonionizing radiation: Secondary | ICD-10-CM | POA: Diagnosis not present

## 2021-09-24 DIAGNOSIS — L814 Other melanin hyperpigmentation: Secondary | ICD-10-CM | POA: Diagnosis not present

## 2021-09-28 ENCOUNTER — Other Ambulatory Visit: Payer: Self-pay | Admitting: Family Medicine

## 2021-10-09 ENCOUNTER — Telehealth: Payer: Self-pay

## 2021-10-09 NOTE — Chronic Care Management (AMB) (Signed)
Chronic Care Management Pharmacy Assistant   Name: ISIAIH HOLLENBACH  MRN: [id number] DOB: 04-10-1946   Reason for Encounter: Disease State call for DM   Recent office visits:  09/09/21 Jerrell Belfast NP. Seen for Gerd, HTN, HLD. Referred to Dermatology. No med changes.   Recent consult visits:  None since 08/29/21  Hospital visits:  None since 08/29/21  Medications: Outpatient Encounter Medications as of 10/09/2021  Medication Sig   acetaminophen (TYLENOL) 500 MG tablet Take 1,000 mg by mouth every 6 (six) hours as needed for moderate pain or headache.   amiodarone (PACERONE) 200 MG tablet Take 1 tablet (200 mg total) by mouth every evening.   apixaban (ELIQUIS) 5 MG TABS tablet Take 5 mg by mouth 2 (two) times daily.    b complex vitamins tablet Take 1 tablet by mouth daily.   BD PEN NEEDLE MICRO U/F 32G X 6 MM MISC SMARTSIG:1 Needle SUB-Q Daily   Budeson-Glycopyrrol-Formoterol (BREZTRI AEROSPHERE) 160-9-4.8 MCG/ACT AERO Inhale 2 puffs into the lungs 2 (two) times daily.   cholecalciferol (VITAMIN D3) 25 MCG (1000 UT) tablet Take 1,000 Units by mouth daily.    CINNAMON PO Take 700 mg by mouth daily.    dapagliflozin propanediol (FARXIGA) 10 MG TABS tablet Take 1 tablet (10 mg total) by mouth daily before breakfast.   folic acid (FOLVITE) 509 MCG tablet Take 800 mcg by mouth daily.   HYDROcodone-acetaminophen (NORCO) 10-325 MG tablet Take 1 tablet by mouth every 4 (four) hours as needed for moderate pain.   insulin degludec (TRESIBA FLEXTOUCH) 100 UNIT/ML FlexTouch Pen Inject 50 Units into the skin daily.   ipratropium-albuterol (DUONEB) 0.5-2.5 (3) MG/3ML SOLN Take 3 mLs by nebulization every 6 (six) hours as needed (shortness of breath).    levocetirizine (XYZAL) 5 MG tablet Take 5 mg by mouth daily as needed for allergies.   magnesium chloride (SLOW-MAG) 64 MG TBEC SR tablet Take 1 tablet by mouth daily.   metolazone (ZAROXOLYN) 5 MG tablet Take 5 mg by mouth once a week.  Uses prn if swollen once weekly.   metoprolol tartrate (LOPRESSOR) 50 MG tablet TAKE 1 TABLET BY MOUTH IN THE MORNING, AT NOON AND AT BEDTIME   montelukast (SINGULAIR) 10 MG tablet TAKE 1 TABLET BY MOUTH ONCE DAILY IN THE EVENING   Omeprazole 20 MG TBEC Take 20 mg by mouth 2 (two) times daily before a meal.    Potassium Chloride ER 20 MEQ TBCR Take 1 tablet by mouth 2 (two) times daily.   rOPINIRole (REQUIP) 0.5 MG tablet TAKE 1 TABLET BY MOUTH AT BEDTIME   rosuvastatin (CRESTOR) 20 MG tablet Take 1 tablet by mouth once daily   Semaglutide (OZEMPIC, 1 MG/DOSE, [place]) Inject 1 mg into the skin 2 (two) times a week.   sertraline (ZOLOFT) 50 MG tablet Take 1 tablet by mouth twice daily   tamsulosin (FLOMAX) 0.4 MG CAPS capsule Take 1 capsule by mouth at bedtime   torsemide (DEMADEX) 20 MG tablet Take 40 mg by mouth 2 (two) times daily.   VENTOLIN HFA 108 (90 Base) MCG/ACT inhaler INHALE 2 PUFFS BY MOUTH EVERY 4 HOURS AS NEEDED FOR WHEEZING OR SHORTNESS OF BREATH   No facility-administered encounter medications on file as of 10/09/2021.   Recent Relevant Labs: Lab Results  Component Value Date/Time   HGBA1C 6.4 (H) 09/09/2021 11:10 AM   HGBA1C 9.4 (H) 12/10/2020 01:42 PM   MICROALBUR 80 01/14/2021 11:53 AM   MICROALBUR 150 05/31/2020 11:09 AM  Kidney Function Lab Results  Component Value Date/Time   CREATININE 1.57 (H) 09/09/2021 11:10 AM   CREATININE 1.38 (H) 07/02/2021 11:42 AM   GFRNONAA 52 (L) 12/10/2020 01:42 PM   GFRAA 60 12/10/2020 01:42 PM     Current antihyperglycemic regimen:  Farxiga 10 mg daily (PAP) Tresiba 50 units daily (PAP) Ozempic 2 mg weekly (PAP)  Patient verbally confirms he is taking the above medications as directed. Yes  What recent interventions/DTPs have been made to improve glycemic control:  Pt stated nothing has changed   Have there been any recent hospitalizations or ED visits since last visit with CPP? No recent visits   Patient denies  hypoglycemic symptoms, including None  Patient denies hyperglycemic symptoms, including none  How often are you checking your blood sugar? 1-3 times weekly   What are your blood sugars ranging?  115-120  On insulin? Yes How many units:50 units   During the week, how often does your blood glucose drop below 70? Never  Are you checking your feet daily/regularly? Yes  Adherence Review: Is the patient currently on a STATIN medication? Yes Is the patient currently on ACE/ARB medication? Yes Does the patient have >5 day gap between last estimated fill dates? NO   Care Gaps: Last eye exam / Retinopathy Screening? Leodis Binet done  Last [employer] Visit? Scheduled 10/28/21 Last Diabetic Foot Exam? 09/09/21   Star Rating Drugs:  Medication:  Last Fill: Day Supply Farxiga 10 mg daily (PAP) Ozempic 2 mg weekly (PAP)    [name], [place] Pharmacist Assistant  [phone]

## 2021-10-20 DIAGNOSIS — Z6841 Body Mass Index (BMI) 40.0 and over, adult: Secondary | ICD-10-CM | POA: Diagnosis not present

## 2021-10-20 DIAGNOSIS — I509 Heart failure, unspecified: Secondary | ICD-10-CM | POA: Diagnosis not present

## 2021-10-20 DIAGNOSIS — I110 Hypertensive heart disease with heart failure: Secondary | ICD-10-CM | POA: Diagnosis not present

## 2021-10-20 DIAGNOSIS — J449 Chronic obstructive pulmonary disease, unspecified: Secondary | ICD-10-CM | POA: Diagnosis not present

## 2021-10-20 DIAGNOSIS — D6869 Other thrombophilia: Secondary | ICD-10-CM | POA: Diagnosis not present

## 2021-10-20 DIAGNOSIS — F324 Major depressive disorder, single episode, in partial remission: Secondary | ICD-10-CM | POA: Diagnosis not present

## 2021-10-20 DIAGNOSIS — E785 Hyperlipidemia, unspecified: Secondary | ICD-10-CM | POA: Diagnosis not present

## 2021-10-20 DIAGNOSIS — I4891 Unspecified atrial fibrillation: Secondary | ICD-10-CM | POA: Diagnosis not present

## 2021-10-20 DIAGNOSIS — Z794 Long term (current) use of insulin: Secondary | ICD-10-CM | POA: Diagnosis not present

## 2021-10-20 DIAGNOSIS — E261 Secondary hyperaldosteronism: Secondary | ICD-10-CM | POA: Diagnosis not present

## 2021-10-20 DIAGNOSIS — E1142 Type 2 diabetes mellitus with diabetic polyneuropathy: Secondary | ICD-10-CM | POA: Diagnosis not present

## 2021-10-28 ENCOUNTER — Other Ambulatory Visit: Payer: Self-pay

## 2021-10-28 ENCOUNTER — Telehealth: Payer: Self-pay

## 2021-10-28 ENCOUNTER — Ambulatory Visit: Payer: Medicare HMO

## 2021-10-28 NOTE — Chronic Care Management (AMB) (Signed)
    Chronic Care Management Pharmacy Assistant   Name: Shawn Sullivan  MRN: [id number] DOB: [birth year]  Reason for Encounter: Patient Assistance Coordination  10/28/2021- 2023 Patient assistance applications filled out for [place] with [employer] Patient assistance program and for Eliquis with [place] Patient assistance program and sent to patient's daughter Shawn Sullivan via email per office.Calling patient to verify email address, daughter number not available in patient's chart, no answer form patient, left message to return call. Tried spouse, number incorrect.   Medications: Outpatient Encounter Medications as of 10/28/2021  Medication Sig   acetaminophen (TYLENOL) 500 MG tablet Take 1,000 mg by mouth every 6 (six) hours as needed for moderate pain or headache.   amiodarone (PACERONE) 200 MG tablet Take 1 tablet (200 mg total) by mouth every evening.   apixaban (ELIQUIS) 5 MG TABS tablet Take 5 mg by mouth 2 (two) times daily.    b complex vitamins tablet Take 1 tablet by mouth daily.   BD PEN NEEDLE MICRO U/F 32G X 6 MM MISC SMARTSIG:1 Needle SUB-Q Daily   Budeson-Glycopyrrol-Formoterol (BREZTRI AEROSPHERE) 160-9-4.8 MCG/ACT AERO Inhale 2 puffs into the lungs 2 (two) times daily.   cholecalciferol (VITAMIN D3) 25 MCG (1000 UT) tablet Take 1,000 Units by mouth daily.    CINNAMON PO Take 700 mg by mouth daily.    dapagliflozin propanediol (FARXIGA) 10 MG TABS tablet Take 1 tablet (10 mg total) by mouth daily before breakfast.   folic acid (FOLVITE) 097 MCG tablet Take 800 mcg by mouth daily.   HYDROcodone-acetaminophen (NORCO) 10-325 MG tablet Take 1 tablet by mouth every 4 (four) hours as needed for moderate pain.   insulin degludec (TRESIBA FLEXTOUCH) 100 UNIT/ML FlexTouch Pen Inject 50 Units into the skin daily.   ipratropium-albuterol (DUONEB) 0.5-2.5 (3) MG/3ML SOLN Take 3 mLs by nebulization every 6 (six) hours as needed (shortness of breath).    levocetirizine  (XYZAL) 5 MG tablet Take 5 mg by mouth daily as needed for allergies.   magnesium chloride (SLOW-MAG) 64 MG TBEC SR tablet Take 1 tablet by mouth daily.   metolazone (ZAROXOLYN) 5 MG tablet Take 5 mg by mouth once a week. Uses prn if swollen once weekly.   metoprolol tartrate (LOPRESSOR) 50 MG tablet TAKE 1 TABLET BY MOUTH IN THE MORNING, AT NOON AND AT BEDTIME   montelukast (SINGULAIR) 10 MG tablet TAKE 1 TABLET BY MOUTH ONCE DAILY IN THE EVENING   Omeprazole 20 MG TBEC Take 20 mg by mouth 2 (two) times daily before a meal.    Potassium Chloride ER 20 MEQ TBCR Take 1 tablet by mouth 2 (two) times daily.   rOPINIRole (REQUIP) 0.5 MG tablet TAKE 1 TABLET BY MOUTH AT BEDTIME   rosuvastatin (CRESTOR) 20 MG tablet Take 1 tablet by mouth once daily   Semaglutide (OZEMPIC, 1 MG/DOSE, [place]) Inject 1 mg into the skin 2 (two) times a week.   sertraline (ZOLOFT) 50 MG tablet Take 1 tablet by mouth twice daily   tamsulosin (FLOMAX) 0.4 MG CAPS capsule Take 1 capsule by mouth at bedtime   torsemide (DEMADEX) 20 MG tablet Take 40 mg by mouth 2 (two) times daily.   VENTOLIN HFA 108 (90 Base) MCG/ACT inhaler INHALE 2 PUFFS BY MOUTH EVERY 4 HOURS AS NEEDED FOR WHEEZING OR SHORTNESS OF BREATH   No facility-administered encounter medications on file as of 10/28/2021.   Pattricia Boss, [place] Pharmacist Assistant [phone]

## 2021-11-11 ENCOUNTER — Other Ambulatory Visit: Payer: Self-pay | Admitting: Family Medicine

## 2021-11-12 DIAGNOSIS — C44319 Basal cell carcinoma of skin of other parts of face: Secondary | ICD-10-CM | POA: Diagnosis not present

## 2021-11-21 ENCOUNTER — Other Ambulatory Visit: Payer: Self-pay | Admitting: Physician Assistant

## 2021-11-24 ENCOUNTER — Other Ambulatory Visit: Payer: Self-pay | Admitting: Physician Assistant

## 2021-12-05 ENCOUNTER — Other Ambulatory Visit: Payer: Self-pay | Admitting: Physician Assistant

## 2021-12-05 ENCOUNTER — Other Ambulatory Visit: Payer: Self-pay | Admitting: Cardiology

## 2021-12-08 ENCOUNTER — Telehealth: Payer: Self-pay

## 2021-12-08 NOTE — Chronic Care Management (AMB) (Signed)
Chronic Care Management Pharmacy Assistant   Name: LILBURN STRAW  MRN: [id number] DOB: Dec 17, 1945   Reason for Encounter: Disease State call for DM    Recent office visits:  None   Recent consult visits:  None   Hospital visits:  None   Medications: Outpatient Encounter Medications as of 12/08/2021  Medication Sig   acetaminophen (TYLENOL) 500 MG tablet Take 1,000 mg by mouth every 6 (six) hours as needed for moderate pain or headache.   amiodarone (PACERONE) 200 MG tablet Take 1 tablet by mouth in the evening   apixaban (ELIQUIS) 5 MG TABS tablet Take 5 mg by mouth 2 (two) times daily.    b complex vitamins tablet Take 1 tablet by mouth daily.   BD PEN NEEDLE MICRO U/F 32G X 6 MM MISC SMARTSIG:1 Needle SUB-Q Daily   Budeson-Glycopyrrol-Formoterol (BREZTRI AEROSPHERE) 160-9-4.8 MCG/ACT AERO Inhale 2 puffs into the lungs 2 (two) times daily.   cholecalciferol (VITAMIN D3) 25 MCG (1000 UT) tablet Take 1,000 Units by mouth daily.    CINNAMON PO Take 700 mg by mouth daily.    dapagliflozin propanediol (FARXIGA) 10 MG TABS tablet Take 1 tablet (10 mg total) by mouth daily before breakfast.   folic acid (FOLVITE) 676 MCG tablet Take 800 mcg by mouth daily.   HYDROcodone-acetaminophen (NORCO) 10-325 MG tablet Take 1 tablet by mouth every 4 (four) hours as needed for moderate pain.   insulin degludec (TRESIBA FLEXTOUCH) 100 UNIT/ML FlexTouch Pen Inject 50 Units into the skin daily.   ipratropium-albuterol (DUONEB) 0.5-2.5 (3) MG/3ML SOLN Take 3 mLs by nebulization every 6 (six) hours as needed (shortness of breath).    levocetirizine (XYZAL) 5 MG tablet Take 5 mg by mouth daily as needed for allergies.   magnesium chloride (SLOW-MAG) 64 MG TBEC SR tablet Take 1 tablet by mouth daily.   metolazone (ZAROXOLYN) 5 MG tablet Take 5 mg by mouth once a week. Uses prn if swollen once weekly.   metoprolol tartrate (LOPRESSOR) 50 MG tablet TAKE 1 TABLET BY MOUTH IN THE MORNING, AT NOON AND  AT BEDTIME   montelukast (SINGULAIR) 10 MG tablet TAKE 1 TABLET BY MOUTH ONCE DAILY IN THE EVENING   Omeprazole 20 MG TBEC Take 20 mg by mouth 2 (two) times daily before a meal.    Potassium Chloride ER 20 MEQ TBCR Take 1 tablet by mouth 2 (two) times daily.   rOPINIRole (REQUIP) 0.5 MG tablet TAKE 1 TABLET BY MOUTH AT BEDTIME   rosuvastatin (CRESTOR) 20 MG tablet Take 1 tablet by mouth once daily   Semaglutide (OZEMPIC, 1 MG/DOSE, [place]) Inject 1 mg into the skin 2 (two) times a week.   sertraline (ZOLOFT) 50 MG tablet Take 1 tablet by mouth twice daily   tamsulosin (FLOMAX) 0.4 MG CAPS capsule Take 1 capsule by mouth at bedtime   torsemide (DEMADEX) 20 MG tablet Take 40 mg by mouth 2 (two) times daily.   VENTOLIN HFA 108 (90 Base) MCG/ACT inhaler INHALE 2 PUFFS BY MOUTH EVERY 4 HOURS AS NEEDED FOR WHEEZING FOR SHORTNESS OF BREATH   No facility-administered encounter medications on file as of 12/08/2021.   Recent Relevant Labs: Lab Results  Component Value Date/Time   HGBA1C 6.4 (H) 09/09/2021 11:10 AM   HGBA1C 9.4 (H) 12/10/2020 01:42 PM   MICROALBUR 80 01/14/2021 11:53 AM   MICROALBUR 150 05/31/2020 11:09 AM    Kidney Function Lab Results  Component Value Date/Time   CREATININE 1.57 (H) 09/09/2021  11:10 AM   CREATININE 1.38 (H) 07/02/2021 11:42 AM   GFRNONAA 52 (L) 12/10/2020 01:42 PM   GFRAA 60 12/10/2020 01:42 PM     Current antihyperglycemic regimen:  Farxiga 10 mg daily (PAP) Tresiba 50 units daily (PAP) Ozempic 2 mg weekly (PAP)  Patient verbally confirms he is taking the above medications as directed. Yes  What recent interventions/DTPs have been made to improve glycemic control:  Pt stated no changes   Have there been any recent hospitalizations or ED visits since last visit with CPP? No  Patient denies hypoglycemic symptoms,  Patient denies hyperglycemic symptoms  How often are you checking your blood sugar? once daily  What are your blood sugars ranging?  127  - 145  On insulin? Yes How many units: 50 units   During the week, how often does your blood glucose drop below 70? Never  Are you checking your feet daily/regularly? Yes  Adherence Review: Is the patient currently on a STATIN medication? Yes Is the patient currently on ACE/ARB medication? Yes Does the patient have >5 day gap between last estimated fill dates? CPP to review  Care Gaps: Last eye exam / Retinopathy Screening? Leodis Binet done  Last [employer] Visit? None noted  Last Diabetic Foot Exam? 09/09/21  Star Rating Drugs:  Medication:  Last Fill: Day Supply Farxiga 10 mg daily (PAP) Ozempic 2 mg weekly (PAP)  [name], [place] Pharmacist Assistant  [phone]

## 2021-12-11 ENCOUNTER — Other Ambulatory Visit: Payer: Self-pay | Admitting: Family Medicine

## 2021-12-12 ENCOUNTER — Ambulatory Visit: Payer: Medicare HMO | Admitting: Family Medicine

## 2021-12-18 ENCOUNTER — Other Ambulatory Visit: Payer: Self-pay | Admitting: Physician Assistant

## 2021-12-24 ENCOUNTER — Ambulatory Visit (INDEPENDENT_AMBULATORY_CARE_PROVIDER_SITE_OTHER): Payer: Medicare HMO | Admitting: Family Medicine

## 2021-12-24 ENCOUNTER — Encounter: Payer: Self-pay | Admitting: Family Medicine

## 2021-12-24 ENCOUNTER — Other Ambulatory Visit: Payer: Self-pay

## 2021-12-24 VITALS — BP 138/70 | HR 72 | Temp 97.1°F | Resp 20 | Ht 71.0 in | Wt 321.0 lb

## 2021-12-24 DIAGNOSIS — I4819 Other persistent atrial fibrillation: Secondary | ICD-10-CM | POA: Diagnosis not present

## 2021-12-24 DIAGNOSIS — G4733 Obstructive sleep apnea (adult) (pediatric): Secondary | ICD-10-CM

## 2021-12-24 DIAGNOSIS — E0842 Diabetes mellitus due to underlying condition with diabetic polyneuropathy: Secondary | ICD-10-CM

## 2021-12-24 DIAGNOSIS — F1120 Opioid dependence, uncomplicated: Secondary | ICD-10-CM

## 2021-12-24 DIAGNOSIS — Z794 Long term (current) use of insulin: Secondary | ICD-10-CM | POA: Diagnosis not present

## 2021-12-24 DIAGNOSIS — Z6841 Body Mass Index (BMI) 40.0 and over, adult: Secondary | ICD-10-CM

## 2021-12-24 DIAGNOSIS — K219 Gastro-esophageal reflux disease without esophagitis: Secondary | ICD-10-CM

## 2021-12-24 DIAGNOSIS — I5041 Acute combined systolic (congestive) and diastolic (congestive) heart failure: Secondary | ICD-10-CM

## 2021-12-24 DIAGNOSIS — J410 Simple chronic bronchitis: Secondary | ICD-10-CM | POA: Diagnosis not present

## 2021-12-24 DIAGNOSIS — I482 Chronic atrial fibrillation, unspecified: Secondary | ICD-10-CM

## 2021-12-24 DIAGNOSIS — E785 Hyperlipidemia, unspecified: Secondary | ICD-10-CM | POA: Diagnosis not present

## 2021-12-24 DIAGNOSIS — J019 Acute sinusitis, unspecified: Secondary | ICD-10-CM | POA: Diagnosis not present

## 2021-12-24 DIAGNOSIS — G894 Chronic pain syndrome: Secondary | ICD-10-CM

## 2021-12-24 DIAGNOSIS — D6869 Other thrombophilia: Secondary | ICD-10-CM | POA: Diagnosis not present

## 2021-12-24 DIAGNOSIS — I130 Hypertensive heart and chronic kidney disease with heart failure and stage 1 through stage 4 chronic kidney disease, or unspecified chronic kidney disease: Secondary | ICD-10-CM

## 2021-12-24 DIAGNOSIS — N1832 Chronic kidney disease, stage 3b: Secondary | ICD-10-CM

## 2021-12-24 HISTORY — DX: Other thrombophilia: D68.69

## 2021-12-24 HISTORY — DX: Opioid dependence, uncomplicated: F11.20

## 2021-12-24 MED ORDER — IPRATROPIUM-ALBUTEROL 0.5-2.5 (3) MG/3ML IN SOLN: 3.0000 mL | Freq: Four times a day (QID) | RESPIRATORY_TRACT | 0 refills | Status: DC | PRN

## 2021-12-24 MED ORDER — AMOXICILLIN-POT CLAVULANATE 875-125 MG PO TABS: 1.0000 | ORAL_TABLET | Freq: Two times a day (BID) | ORAL | 0 refills | Status: DC

## 2021-12-24 NOTE — Assessment & Plan Note (Signed)
Management per specialist. 

## 2021-12-24 NOTE — Assessment & Plan Note (Signed)
The current medical regimen is effective;  continue present plan and medications.  

## 2021-12-24 NOTE — Assessment & Plan Note (Signed)
Augmentin rx

## 2021-12-24 NOTE — Assessment & Plan Note (Signed)
Breztri 2 puffs twice a day sample given.  Awaiting patient assistance.  Start on augmentin. No prednisone at this time.  Continue rescue nebs/HFA four times a day as needed

## 2021-12-24 NOTE — Progress Notes (Signed)
Subjective:  Patient ID: Shawn Sullivan, male    DOB: August 11, 1946  Age: 76 y.o. MRN: [id number]  Chief Complaint  Patient presents with   Diabetes   Hypertension    HPI Diabetes:  Complications: neuropathy.  Glucose checking: eery 2-3 days Glucose logs: 130-170s Hypoglycemia: no Most recent A1C: 6.4. Current medications: ozempic 1 mg one shot twice a week, farxiga 10 mg daily, tresiba 50 U daily.  Last Eye Exam: 09/2021 Foot checks:check daily.   Hyperlipidemia: Current medications: crestor 20 mg qd  Hypertension: Complications: Current medications: on demedex 20 mg 2 po twice daily, metolazone 5 mg once a wk as needed swelling. and potassium 20 meq twice daily.    Atrial fibrillation: on amiodarone 200 mg daily and eliquis 5 mg twice daily  RLS: on requip.  Chronic pain syndrome: Sees pain clinic. ON hydrocodone/APAP  10/325 mg four times a day as needed  Mild recurrent depression: on zoloft 50 mg twice daily  GERD: on omeprazole 20 mg 2 oral twice daily  COPD: Patient has been out of breztri for at least 2 weeks. Patient has been using his albuterol and duoneb treatments few times a day. Patient has not felt well since December. Cough and congestion. DOE. Patient is also on allergy medicines including singulair, xyzal,  Diet: not healthy Exercise: none  Current Outpatient Medications on File Prior to Visit  Medication Sig Dispense Refill   acetaminophen (TYLENOL) 500 MG tablet Take 1,000 mg by mouth every 6 (six) hours as needed for moderate pain or headache.     amiodarone (PACERONE) 200 MG tablet Take 1 tablet by mouth in the evening 90 tablet 0   apixaban (ELIQUIS) 5 MG TABS tablet Take 5 mg by mouth 2 (two) times daily.      b complex vitamins tablet Take 1 tablet by mouth daily.     BD PEN NEEDLE MICRO U/F 32G X 6 MM MISC SMARTSIG:1 Needle SUB-Q Daily     Budeson-Glycopyrrol-Formoterol (BREZTRI AEROSPHERE) 160-9-4.8 MCG/ACT AERO Inhale 2 puffs into the lungs 2  (two) times daily. 10.7 g 0   cholecalciferol (VITAMIN D3) 25 MCG (1000 UT) tablet Take 1,000 Units by mouth daily.      CINNAMON PO Take 700 mg by mouth daily.      dapagliflozin propanediol (FARXIGA) 10 MG TABS tablet Take 1 tablet (10 mg total) by mouth daily before breakfast. 90 tablet 3   folic acid (FOLVITE) 341 MCG tablet Take 800 mcg by mouth daily.     HYDROcodone-acetaminophen (NORCO) 10-325 MG tablet Take 1 tablet by mouth every 4 (four) hours as needed for moderate pain.     insulin degludec (TRESIBA FLEXTOUCH) 100 UNIT/ML FlexTouch Pen Inject 50 Units into the skin daily.     levocetirizine (XYZAL) 5 MG tablet Take 5 mg by mouth daily as needed for allergies.     magnesium chloride (SLOW-MAG) 64 MG TBEC SR tablet Take 1 tablet by mouth daily.     metolazone (ZAROXOLYN) 5 MG tablet Take 5 mg by mouth once a week. Uses prn if swollen once weekly.     metoprolol tartrate (LOPRESSOR) 50 MG tablet TAKE 1 TABLET BY MOUTH IN THE MORNING, AT NOON AND AT BEDTIME 270 tablet 2   montelukast (SINGULAIR) 10 MG tablet TAKE 1 TABLET BY MOUTH ONCE DAILY IN THE EVENING 90 tablet 0   Omeprazole 20 MG TBEC Take 20 mg by mouth 2 (two) times daily before a meal.  Potassium Chloride ER 20 MEQ TBCR Take 1 tablet by mouth 2 (two) times daily.     rOPINIRole (REQUIP) 0.5 MG tablet TAKE 1 TABLET BY MOUTH AT BEDTIME 30 tablet 2   rosuvastatin (CRESTOR) 20 MG tablet Take 1 tablet by mouth once daily 90 tablet 0   Semaglutide (OZEMPIC, 1 MG/DOSE, [place]) Inject 1 mg into the skin 2 (two) times a week.     sertraline (ZOLOFT) 50 MG tablet Take 1 tablet by mouth twice daily 180 tablet 0   tamsulosin (FLOMAX) 0.4 MG CAPS capsule Take 1 capsule by mouth at bedtime 90 capsule 0   torsemide (DEMADEX) 20 MG tablet Take 40 mg by mouth 2 (two) times daily.     VENTOLIN HFA 108 (90 Base) MCG/ACT inhaler INHALE 2 PUFFS BY MOUTH EVERY 4 HOURS AS NEEDED FOR WHEEZING FOR SHORTNESS OF BREATH 18 g 0   No current  facility-administered medications on file prior to visit.   Past Medical History:  Diagnosis Date   Anticoagulated 07/01/2018   Anticoagulated 07/01/2018   Benign essential hypertension 05/06/2013   Chronic obstructive pulmonary disease ([place]) 11/05/2015   COPD (chronic obstructive pulmonary disease) ([place]) 07/01/2018   Dermatochalasis 12/16/2015   Disorder of bursae and tendons in shoulder region 01/05/2014   Generalized edema 06/22/2016   Hyperlipidemia 12/16/2015   Hypertensive heart disease 07/01/2018   Low back pain 08/17/2013   Lumbosacral spondylosis 05/06/2013   Obstructive sleep apnea 07/01/2018   Persistent atrial fibrillation ([place]) 07/01/2018   Piriformis syndrome 05/03/2014   Postlaminectomy syndrome, lumbar region 05/06/2013   Presbyopia of both eyes 12/16/2015   Restless legs syndrome 03/23/2016   Restrictive lung disease 06/22/2016   Sleep apnea 05/06/2013   Type 2 diabetes mellitus without complications ([place]) 58/0/9983   Past Surgical History:  Procedure Laterality Date   APPENDECTOMY     BACK SURGERY     CARDIOVERSION N/A 12/01/2018   Procedure: CARDIOVERSION;  Surgeon: Sanda Klein, MD;  Location: [facility];  Service: Cardiovascular;  Laterality: N/A;   CATARACT EXTRACTION Bilateral    LUMBAR FUSION     NECK SURGERY      Family History  Problem Relation Age of Onset   Atrial fibrillation Mother    Cancer Mother    Heart disease Father    Social History   Socioeconomic History   Marital status: Married    Spouse name: Not on file   Number of children: Not on file   Years of education: Not on file   Highest education level: Not on file  Occupational History   Not on file  Tobacco Use   Smoking status: Former    Types: Cigarettes    Quit date: 1993    Years since quitting: 30.0   Smokeless tobacco: Never  Vaping Use   Vaping Use: Never used  Substance and Sexual Activity   Alcohol use: Not Currently   Drug use: Not Currently    Types: Hydrocodone   Sexual activity:  Not on file  Other Topics Concern   Not on file  Social History Narrative   Not on file   Social Determinants of Health   Financial Resource Strain: Not on file  Food Insecurity: Not on file  Transportation Needs: Not on file  Physical Activity: Not on file  Stress: Not on file  Social Connections: Not on file    Review of Systems  Constitutional:  Positive for fatigue. Negative for chills and fever.  HENT:  Positive for congestion,  rhinorrhea and sinus pressure. Negative for ear pain and sore throat.   Respiratory:  Positive for cough and shortness of breath (out of breztri.).   Cardiovascular:  Negative for chest pain and palpitations.  Gastrointestinal:  Negative for abdominal pain, constipation, diarrhea, nausea and vomiting.  Genitourinary:  Negative for dysuria and urgency.  Musculoskeletal:  Positive for arthralgias (bilaterl knee pain), back pain and myalgias.  Neurological:  Negative for dizziness and headaches.  Psychiatric/Behavioral:  Negative for dysphoric mood. The patient is not nervous/anxious.     Objective:  BP 138/70    Pulse 72    Temp (!) 97.1 F (36.2 C)    Resp 20    Ht 5\' 11"  (1.803 m)    Wt (!) 321 lb (145.6 kg)    BMI 44.77 kg/m   BP/Weight 12/24/2021 42/35/3614 03/03/1539  Systolic BP 086 761 950  Diastolic BP 70 76 72  Wt. (Lbs) 321 319 322  BMI 44.77 44.49 43.67    Physical Exam Vitals reviewed.  Constitutional:      Appearance: Normal appearance.  HENT:     Nose:     Comments: Sinus tenderness Neck:     Vascular: No carotid bruit.  Cardiovascular:     Rate and Rhythm: Normal rate and regular rhythm.     Pulses: Normal pulses.     Heart sounds: Normal heart sounds.  Pulmonary:     Effort: Pulmonary effort is normal.     Breath sounds: Normal breath sounds. No wheezing, rhonchi or rales.  Abdominal:     General: Bowel sounds are normal.     Palpations: Abdomen is soft.     Tenderness: There is no abdominal tenderness.  Neurological:      Mental Status: He is alert and oriented to person, place, and time.  Psychiatric:        Mood and Affect: Mood normal.        Behavior: Behavior normal.    Diabetic Foot Exam - Simple   Simple Foot Form Diabetic Foot exam was performed with the following findings: Yes 12/24/2021 10:03 PM  Visual Inspection See comments: Yes Sensation Testing Intact to touch and monofilament testing bilaterally: Yes Pulse Check Posterior Tibialis and Dorsalis pulse intact bilaterally: Yes Comments Calluses BL feet      Lab Results  Component Value Date   WBC 11.1 (H) 09/09/2021   HGB 12.8 (L) 09/09/2021   HCT 41.7 09/09/2021   PLT 191 09/09/2021   GLUCOSE 102 (H) 09/09/2021   CHOL 149 09/09/2021   TRIG 203 (H) 09/09/2021   HDL 35 (L) 09/09/2021   LDLCALC 80 09/09/2021   ALT 17 09/09/2021   AST 22 09/09/2021   NA 142 09/09/2021   K 5.2 09/09/2021   CL 104 09/09/2021   CREATININE 1.57 (H) 09/09/2021   BUN 14 09/09/2021   CO2 26 09/09/2021   TSH 2.670 07/02/2021   INR 1.1 11/25/2018   HGBA1C 6.4 (H) 09/09/2021   MICROALBUR 80 01/14/2021      Assessment & Plan:   Problem List Items Addressed This Visit       Cardiovascular and Mediastinum   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  No changes to medicines.  Continue to work on eating a healthy diet and exercise.  Labs drawn today.  Management per cardiology.       RESOLVED: Persistent atrial fibrillation (  [place])   Atrial fibrillation, chronic ([place])    The current medical regimen is effective;  continue present plan and medications. Cardiology management         Respiratory   Obstructive sleep apnea    Continue cpap      Chronic obstructive pulmonary disease (HCC)    Breztri 2 puffs twice a day sample given.  Awaiting patient assistance.  Start on augmentin. No prednisone at this time.   Continue rescue nebs/HFA four times a day as needed       Relevant Medications   ipratropium-albuterol (DUONEB) 0.5-2.5 (3) MG/3ML SOLN   Acute sinusitis    Augmentin rx      Relevant Medications   amoxicillin-clavulanate (AUGMENTIN) 875-125 MG tablet     Digestive   Gastroesophageal reflux disease without esophagitis    The current medical regimen is effective;  continue present plan and medications.          Endocrine   Diabetes mellitus due to underlying condition, controlled, with diabetic polyneuropathy, with long-term current use of insulin ([place]) - Primary    Control: good Recommend check sugars fasting daily. Recommend check feet daily. Recommend annual eye exams. Medicines: no changes.  Continue to work on eating a healthy diet Labs drawn today.          Relevant Orders   CBC with Differential/Platelet   Comprehensive metabolic panel   Hemoglobin A1c   Lipid panel   Microalbumin / creatinine urine ratio     Hematopoietic and Hemostatic   Acquired thrombophilia ([place])    Secondary to eliquis needed for atrial fibrillation         Other   Hyperlipidemia    Well controlled.  No changes to medicines.  Continue to work on eating a healthy diet and exercise.  Labs drawn today.        Chronic pain syndrome    Management per specialist.        Class 3 severe obesity due to excess calories with serious comorbidity and body mass index (BMI) of 40.0 to 44.9 in adult [facility])    Recommend patient eat healthier.       Uncomplicated opioid dependence ([place])    Management per specialist.       .  Meds ordered this encounter  Medications   amoxicillin-clavulanate (AUGMENTIN) 875-125 MG tablet    Sig: Take 1 tablet by mouth 2 (two) times daily.    Dispense:  20 tablet    Refill:  0   ipratropium-albuterol (DUONEB) 0.5-2.5 (3) MG/3ML SOLN    Sig: Take 3 mLs by nebulization every 6 (six) hours as needed (shortness of breath).    Dispense:  360 mL     Refill:  0    Orders Placed This Encounter  Procedures   CBC with Differential/Platelet   Comprehensive metabolic panel   Hemoglobin A1c   Lipid panel   Microalbumin / creatinine urine ratio    Total time spent on today's visit was greater than 40 minutes, including both face-to-face time and nonface-to-face time personally spent on review of chart (labs and imaging), discussing goals, reviewing outside records of pertinent, answering patient's questions, and coordinating care.  Follow-up: Return in about 3 months (around 03/24/2022) for chronic fasting.  An After Visit Summary was printed and given to the patient.  Rochel Brome, MD [facility] [phone]

## 2021-12-24 NOTE — Assessment & Plan Note (Signed)
Continue cpap.  

## 2021-12-24 NOTE — Assessment & Plan Note (Signed)
Well controlled.  No changes to medicines.  Continue to work on eating a healthy diet and exercise.  Labs drawn today.  Management per cardiology.

## 2021-12-24 NOTE — Assessment & Plan Note (Signed)
Well controlled.  ?No changes to medicines.  ?Continue to work on eating a healthy diet and exercise.  ?Labs drawn today.  ?

## 2021-12-24 NOTE — Assessment & Plan Note (Signed)
The current medical regimen is effective;  continue present plan and medications. Cardiology management

## 2021-12-24 NOTE — Assessment & Plan Note (Signed)
Secondary to eliquis needed for atrial fibrillation  

## 2021-12-24 NOTE — Assessment & Plan Note (Signed)
Recommend patient eat healthier.

## 2021-12-24 NOTE — Assessment & Plan Note (Signed)
Control: good Recommend check sugars fasting daily. Recommend check feet daily. Recommend annual eye exams. Medicines: no changes.  Continue to work on eating a healthy diet Labs drawn today.

## 2021-12-25 LAB — LIPID PANEL
Chol/HDL Ratio: 4.3 ratio (ref 0.0–5.0)
Cholesterol, Total: 154 mg/dL (ref 100–199)
HDL: 36 mg/dL — ABNORMAL LOW (ref >39)
LDL Chol Calc (NIH): 87 mg/dL (ref 0–99)
Triglycerides: 177 mg/dL — ABNORMAL HIGH (ref 0–149)
VLDL Cholesterol Cal: 31 mg/dL (ref 5–40)

## 2021-12-25 LAB — COMPREHENSIVE METABOLIC PANEL
ALT: 26 IU/L (ref 0–44)
AST: 35 IU/L (ref 0–40)
Albumin/Globulin Ratio: 1.9 (ref 1.2–2.2)
Albumin: 4.1 g/dL (ref 3.7–4.7)
Alkaline Phosphatase: 116 IU/L (ref 44–121)
BUN/Creatinine Ratio: 10 (ref 10–24)
BUN: 16 mg/dL (ref 8–27)
Bilirubin Total: 0.7 mg/dL (ref 0.0–1.2)
CO2: 28 mmol/L (ref 20–29)
Calcium: 9.7 mg/dL (ref 8.6–10.2)
Chloride: 104 mmol/L (ref 96–106)
Creatinine, Ser: 1.56 mg/dL — ABNORMAL HIGH (ref 0.76–1.27)
Globulin, Total: 2.2 g/dL (ref 1.5–4.5)
Glucose: 104 mg/dL — ABNORMAL HIGH (ref 70–99)
Potassium: 4.5 mmol/L (ref 3.5–5.2)
Sodium: 143 mmol/L (ref 134–144)
Total Protein: 6.3 g/dL (ref 6.0–8.5)
eGFR: 45 mL/min/{1.73_m2} — ABNORMAL LOW (ref >59)

## 2021-12-25 LAB — CBC WITH DIFFERENTIAL/PLATELET
Basophils Absolute: 0.1 10*3/uL (ref 0.0–0.2)
Basos: 1 %
EOS (ABSOLUTE): 0.3 10*3/uL (ref 0.0–0.4)
Eos: 3 %
Hematocrit: 40.7 % (ref 37.5–51.0)
Hemoglobin: 13.1 g/dL (ref 13.0–17.7)
Immature Grans (Abs): 0 10*3/uL (ref 0.0–0.1)
Immature Granulocytes: 0 %
Lymphocytes Absolute: 2.4 10*3/uL (ref 0.7–3.1)
Lymphs: 23 %
MCH: 27.8 pg (ref 26.6–33.0)
MCHC: 32.2 g/dL (ref 31.5–35.7)
MCV: 86 fL (ref 79–97)
Monocytes Absolute: 0.8 10*3/uL (ref 0.1–0.9)
Monocytes: 8 %
Neutrophils Absolute: 6.7 10*3/uL (ref 1.4–7.0)
Neutrophils: 65 %
Platelets: 159 10*3/uL (ref 150–450)
RBC: 4.71 x10E6/uL (ref 4.14–5.80)
RDW: 14.3 % (ref 11.6–15.4)
WBC: 10.4 10*3/uL (ref 3.4–10.8)

## 2021-12-25 LAB — MICROALBUMIN / CREATININE URINE RATIO
Creatinine, Urine: 135.8 mg/dL
Microalb/Creat Ratio: 26 mg/g creat (ref 0–29)
Microalbumin, Urine: 35.2 ug/mL

## 2021-12-25 LAB — CARDIOVASCULAR RISK ASSESSMENT

## 2021-12-25 LAB — HEMOGLOBIN A1C
Est. average glucose Bld gHb Est-mCnc: 169 mg/dL
Hgb A1c MFr Bld: 7.5 % — ABNORMAL HIGH (ref 4.8–5.6)

## 2021-12-28 NOTE — Progress Notes (Signed)
Blood count normal.  Liver function normal.  Kidney function abnormal, but stable. Not spilling protein. Cholesterol: trigs improved, but not quite at goal. Hdl low, but stable. LDL good, but not quite at goal of less than 70. Recommend increase crestor to 40 mg daily.   HBA1C: increased to 7.5. Recommend tresiba 55 U daily.

## 2021-12-31 ENCOUNTER — Other Ambulatory Visit: Payer: Self-pay

## 2021-12-31 MED ORDER — ROSUVASTATIN CALCIUM 40 MG PO TABS: 40.0000 mg | ORAL_TABLET | Freq: Every day | ORAL | 1 refills | Status: DC

## 2022-01-05 DIAGNOSIS — R0981 Nasal congestion: Secondary | ICD-10-CM | POA: Diagnosis not present

## 2022-01-05 DIAGNOSIS — Z20822 Contact with and (suspected) exposure to covid-19: Secondary | ICD-10-CM | POA: Diagnosis not present

## 2022-01-06 ENCOUNTER — Other Ambulatory Visit: Payer: Self-pay

## 2022-01-06 ENCOUNTER — Telehealth: Payer: Self-pay

## 2022-01-06 ENCOUNTER — Ambulatory Visit: Payer: Medicare HMO | Admitting: Cardiology

## 2022-01-06 MED ORDER — BREZTRI AEROSPHERE 160-9-4.8 MCG/ACT IN AERO: 2.0000 | INHALATION_SPRAY | Freq: Two times a day (BID) | RESPIRATORY_TRACT | 0 refills | Status: DC

## 2022-01-06 NOTE — Chronic Care Management (AMB) (Signed)
Chronic Care Management Pharmacy Assistant   Name: Shawn Sullivan  MRN: [id number] DOB: 09/18/1946   Reason for Encounter: Patient Assistance Documentation   I called and spoke with Daughter, Shawn Sullivan that had questions about her father PAP companies. She stated she talked to [facility] about father's Breztri and stated they were sending it and still never got their medication. I gave her the numbers to [place] [place] to follow up on his medications and call if they have any questions. Pt daughter understood and appreciated it.   Medications: Outpatient Encounter Medications as of 01/06/2022  Medication Sig   acetaminophen (TYLENOL) 500 MG tablet Take 1,000 mg by mouth every 6 (six) hours as needed for moderate pain or headache.   amiodarone (PACERONE) 200 MG tablet Take 1 tablet by mouth in the evening   amoxicillin-clavulanate (AUGMENTIN) 875-125 MG tablet Take 1 tablet by mouth 2 (two) times daily.   apixaban (ELIQUIS) 5 MG TABS tablet Take 5 mg by mouth 2 (two) times daily.    b complex vitamins tablet Take 1 tablet by mouth daily.   BD PEN NEEDLE MICRO U/F 32G X 6 MM MISC SMARTSIG:1 Needle SUB-Q Daily   Budeson-Glycopyrrol-Formoterol (BREZTRI AEROSPHERE) 160-9-4.8 MCG/ACT AERO Inhale 2 puffs into the lungs 2 (two) times daily.   cholecalciferol (VITAMIN D3) 25 MCG (1000 UT) tablet Take 1,000 Units by mouth daily.    CINNAMON PO Take 700 mg by mouth daily.    dapagliflozin propanediol (FARXIGA) 10 MG TABS tablet Take 1 tablet (10 mg total) by mouth daily before breakfast.   folic acid (FOLVITE) 426 MCG tablet Take 800 mcg by mouth daily.   HYDROcodone-acetaminophen (NORCO) 10-325 MG tablet Take 1 tablet by mouth every 4 (four) hours as needed for moderate pain.   insulin degludec (TRESIBA FLEXTOUCH) 100 UNIT/ML FlexTouch Pen Inject 50 Units into the skin daily.   ipratropium-albuterol (DUONEB) 0.5-2.5 (3) MG/3ML SOLN Take 3 mLs by nebulization every 6 (six) hours as needed  (shortness of breath).   levocetirizine (XYZAL) 5 MG tablet Take 5 mg by mouth daily as needed for allergies.   magnesium chloride (SLOW-MAG) 64 MG TBEC SR tablet Take 1 tablet by mouth daily.   metolazone (ZAROXOLYN) 5 MG tablet Take 5 mg by mouth once a week. Uses prn if swollen once weekly.   metoprolol tartrate (LOPRESSOR) 50 MG tablet TAKE 1 TABLET BY MOUTH IN THE MORNING, AT NOON AND AT BEDTIME   montelukast (SINGULAIR) 10 MG tablet TAKE 1 TABLET BY MOUTH ONCE DAILY IN THE EVENING   Omeprazole 20 MG TBEC Take 20 mg by mouth 2 (two) times daily before a meal.    Potassium Chloride ER 20 MEQ TBCR Take 1 tablet by mouth 2 (two) times daily.   rOPINIRole (REQUIP) 0.5 MG tablet TAKE 1 TABLET BY MOUTH AT BEDTIME   rosuvastatin (CRESTOR) 40 MG tablet Take 1 tablet (40 mg total) by mouth daily.   Semaglutide (OZEMPIC, 1 MG/DOSE, [place]) Inject 1 mg into the skin 2 (two) times a week.   sertraline (ZOLOFT) 50 MG tablet Take 1 tablet by mouth twice daily   tamsulosin (FLOMAX) 0.4 MG CAPS capsule Take 1 capsule by mouth at bedtime   torsemide (DEMADEX) 20 MG tablet Take 40 mg by mouth 2 (two) times daily.   VENTOLIN HFA 108 (90 Base) MCG/ACT inhaler INHALE 2 PUFFS BY MOUTH EVERY 4 HOURS AS NEEDED FOR WHEEZING FOR SHORTNESS OF BREATH   No facility-administered encounter medications on  file as of 01/06/2022.    [name], [place] Pharmacist Assistant  [phone]

## 2022-01-06 NOTE — Progress Notes (Deleted)
Cardiology Office Note:    Date:  01/06/2022   ID:  Shawn Sullivan, DOB [birth year], MRN [id number]  PCP:  Rochel Brome, MD  Cardiologist:  Shirlee More, MD    Referring MD: Rochel Brome, MD    ASSESSMENT:    No diagnosis found. PLAN:    In order of problems listed above:  ***   Next appointment: ***   Medication Adjustments/Labs and Tests Ordered: Current medicines are reviewed at length with the patient today.  Concerns regarding medicines are outlined above.  No orders of the defined types were placed in this encounter.  No orders of the defined types were placed in this encounter.   No chief complaint on file.   History of Present Illness:    Shawn Sullivan is a 76 y.o. male with a hx of paroxysmal atrial fibrillation maintaining sinus rhythm on low-dose amiodarone anticoagulated, hypertensive heart and chronic kidney disease with diastolic heart failure and stage III CKD mild aortic stenosis and right bundle branch block  last seen 07/02/2021.Echocardiogram performed 09/11/2020 shows normal ejection fraction 60 to 65% moderate concentric LVH elevated left ventricular filling pressures and mild aortic stenosis with aortic valve calcification peak and mean gradients of 30 and 15 mmHg Compliance with diet, lifestyle and medications: *** Past Medical History:  Diagnosis Date   Anticoagulated 07/01/2018   Anticoagulated 07/01/2018   Benign essential hypertension 05/06/2013   Chronic obstructive pulmonary disease ([place]) 11/05/2015   COPD (chronic obstructive pulmonary disease) ([place]) 07/01/2018   Dermatochalasis 12/16/2015   Disorder of bursae and tendons in shoulder region 01/05/2014   Generalized edema 06/22/2016   Hyperlipidemia 12/16/2015   Hypertensive heart disease 07/01/2018   Low back pain 08/17/2013   Lumbosacral spondylosis 05/06/2013   Obstructive sleep apnea 07/01/2018   Persistent atrial fibrillation ([place]) 07/01/2018   Piriformis syndrome 05/03/2014   Postlaminectomy  syndrome, lumbar region 05/06/2013   Presbyopia of both eyes 12/16/2015   Restless legs syndrome 03/23/2016   Restrictive lung disease 06/22/2016   Sleep apnea 05/06/2013   Type 2 diabetes mellitus without complications ([place]) 74/11/6382    Past Surgical History:  Procedure Laterality Date   APPENDECTOMY     BACK SURGERY     CARDIOVERSION N/A 12/01/2018   Procedure: CARDIOVERSION;  Surgeon: Sanda Klein, MD;  Location: [facility];  Service: Cardiovascular;  Laterality: N/A;   CATARACT EXTRACTION Bilateral    LUMBAR FUSION     NECK SURGERY      Current Medications: No outpatient medications have been marked as taking for the 01/06/22 encounter (Appointment) with Richardo Priest, MD.     Allergies:   Androgel [testosterone], Biaxin [clarithromycin], Duloxetine, Gabapentin, Ibuprofen, Lyrica [pregabalin], and Spironolactone   Social History   Socioeconomic History   Marital status: Married    Spouse name: Not on file   Number of children: Not on file   Years of education: Not on file   Highest education level: Not on file  Occupational History   Not on file  Tobacco Use   Smoking status: Former    Types: Cigarettes    Quit date: 15    Years since quitting: 30.1   Smokeless tobacco: Never  Vaping Use   Vaping Use: Never used  Substance and Sexual Activity   Alcohol use: Not Currently   Drug use: Not Currently    Types: Hydrocodone   Sexual activity: Not on file  Other Topics Concern   Not on file  Social History Narrative   Not  on file   Social Determinants of Health   Financial Resource Strain: Not on file  Food Insecurity: Not on file  Transportation Needs: Not on file  Physical Activity: Not on file  Stress: Not on file  Social Connections: Not on file     Family History: The patient's ***family history includes Atrial fibrillation in his mother; Cancer in his mother; Heart disease in his father. ROS:   Please see the history of present illness.    All  other systems reviewed and are negative.  EKGs/Labs/Other Studies Reviewed:    The following studies were reviewed today:  EKG:  EKG ordered today and personally reviewed.  The ekg ordered today demonstrates ***  Recent Labs: 07/02/2021: TSH 2.670 12/24/2021: ALT 26; BUN 16; Creatinine, Ser 1.56; Hemoglobin 13.1; Platelets 159; Potassium 4.5; Sodium 143  Recent Lipid Panel    Component Value Date/Time   CHOL 154 12/24/2021 1315   TRIG 177 (H) 12/24/2021 1315   HDL 36 (L) 12/24/2021 1315   CHOLHDL 4.3 12/24/2021 1315   LDLCALC 87 12/24/2021 1315    Physical Exam:    VS:  There were no vitals taken for this visit.    Wt Readings from Last 3 Encounters:  12/24/21 (!) 321 lb (145.6 kg)  09/09/21 (!) 319 lb (144.7 kg)  07/02/21 (!) 322 lb (146.1 kg)     GEN: *** Well nourished, well developed in no acute distress HEENT: Normal NECK: No JVD; No carotid bruits LYMPHATICS: No lymphadenopathy CARDIAC: ***RRR, no murmurs, rubs, gallops RESPIRATORY:  Clear to auscultation without rales, wheezing or rhonchi  ABDOMEN: Soft, non-tender, non-distended MUSCULOSKELETAL:  No edema; No deformity  SKIN: Warm and dry NEUROLOGIC:  Alert and oriented x 3 PSYCHIATRIC:  Normal affect    Signed, Shirlee More, MD  01/06/2022 7:47 AM    [place]

## 2022-01-14 ENCOUNTER — Telehealth: Payer: Self-pay

## 2022-01-14 NOTE — Chronic Care Management (AMB) (Signed)
Chronic Care Management Pharmacy Assistant   Name: Shawn Sullivan  MRN: [id number] DOB: [birth year]   Reason for Encounter: Disease State call for DM    Recent office visits:  12/31/21 Orders Only. Increased Rosuvastatin from 20mg  to 40mg  daily.   12/24/21 Rochel Brome MD. Seen for DM, HTN. Started on Augmentin 875-125mg .   Recent consult visits:  12/19/21 Orders Only. (Pain Management) Ordered Norco 10-325mg .  Hospital visits:  None  Medications: Outpatient Encounter Medications as of 01/14/2022  Medication Sig   acetaminophen (TYLENOL) 500 MG tablet Take 1,000 mg by mouth every 6 (six) hours as needed for moderate pain or headache.   amiodarone (PACERONE) 200 MG tablet Take 1 tablet by mouth in the evening   amoxicillin-clavulanate (AUGMENTIN) 875-125 MG tablet Take 1 tablet by mouth 2 (two) times daily.   apixaban (ELIQUIS) 5 MG TABS tablet Take 5 mg by mouth 2 (two) times daily.    b complex vitamins tablet Take 1 tablet by mouth daily.   BD PEN NEEDLE MICRO U/F 32G X 6 MM MISC SMARTSIG:1 Needle SUB-Q Daily   Budeson-Glycopyrrol-Formoterol (BREZTRI AEROSPHERE) 160-9-4.8 MCG/ACT AERO Inhale 2 puffs into the lungs 2 (two) times daily.   cholecalciferol (VITAMIN D3) 25 MCG (1000 UT) tablet Take 1,000 Units by mouth daily.    CINNAMON PO Take 700 mg by mouth daily.    dapagliflozin propanediol (FARXIGA) 10 MG TABS tablet Take 1 tablet (10 mg total) by mouth daily before breakfast.   folic acid (FOLVITE) 856 MCG tablet Take 800 mcg by mouth daily.   HYDROcodone-acetaminophen (NORCO) 10-325 MG tablet Take 1 tablet by mouth every 4 (four) hours as needed for moderate pain.   insulin degludec (TRESIBA FLEXTOUCH) 100 UNIT/ML FlexTouch Pen Inject 50 Units into the skin daily.   ipratropium-albuterol (DUONEB) 0.5-2.5 (3) MG/3ML SOLN Take 3 mLs by nebulization every 6 (six) hours as needed (shortness of breath).   levocetirizine (XYZAL) 5 MG tablet Take 5 mg by mouth daily as needed for  allergies.   magnesium chloride (SLOW-MAG) 64 MG TBEC SR tablet Take 1 tablet by mouth daily.   metolazone (ZAROXOLYN) 5 MG tablet Take 5 mg by mouth once a week. Uses prn if swollen once weekly.   metoprolol tartrate (LOPRESSOR) 50 MG tablet TAKE 1 TABLET BY MOUTH IN THE MORNING, AT NOON AND AT BEDTIME   montelukast (SINGULAIR) 10 MG tablet TAKE 1 TABLET BY MOUTH ONCE DAILY IN THE EVENING   Omeprazole 20 MG TBEC Take 20 mg by mouth 2 (two) times daily before a meal.    Potassium Chloride ER 20 MEQ TBCR Take 1 tablet by mouth 2 (two) times daily.   rOPINIRole (REQUIP) 0.5 MG tablet TAKE 1 TABLET BY MOUTH AT BEDTIME   rosuvastatin (CRESTOR) 40 MG tablet Take 1 tablet (40 mg total) by mouth daily.   Semaglutide (OZEMPIC, 1 MG/DOSE, [place]) Inject 1 mg into the skin 2 (two) times a week.   sertraline (ZOLOFT) 50 MG tablet Take 1 tablet by mouth twice daily   tamsulosin (FLOMAX) 0.4 MG CAPS capsule Take 1 capsule by mouth at bedtime   torsemide (DEMADEX) 20 MG tablet Take 40 mg by mouth 2 (two) times daily.   VENTOLIN HFA 108 (90 Base) MCG/ACT inhaler INHALE 2 PUFFS BY MOUTH EVERY 4 HOURS AS NEEDED FOR WHEEZING FOR SHORTNESS OF BREATH   No facility-administered encounter medications on file as of 01/14/2022.    Recent Relevant Labs: Lab Results  Component Value Date/Time  HGBA1C 7.5 (H) 12/24/2021 01:15 PM   HGBA1C 6.4 (H) 09/09/2021 11:10 AM   MICROALBUR 80 01/14/2021 11:53 AM   MICROALBUR 150 05/31/2020 11:09 AM    Kidney Function Lab Results  Component Value Date/Time   CREATININE 1.56 (H) 12/24/2021 01:15 PM   CREATININE 1.57 (H) 09/09/2021 11:10 AM   GFRNONAA 52 (L) 12/10/2020 01:42 PM   GFRAA 60 12/10/2020 01:42 PM     Current antihyperglycemic regimen:  Farxiga 10 mg daily (PAP) Tresiba 50 units daily (PAP) Ozempic 2 mg weekly (PAP) Patient verbally confirms he is taking the above medications as directed. Yes  What recent interventions/DTPs have been made to improve glycemic  control:  Pt denies any changes   Have there been any recent hospitalizations or ED visits since last visit with CPP? No  Patient denies hypoglycemic symptoms  Patient denies hyperglycemic symptoms  How often are you checking your blood sugar? once daily  What are your blood sugars ranging?  130,145  On insulin? Yes How many units:50 units   During the week, how often does your blood glucose drop below 70? Never  Are you checking your feet daily/regularly? Yes  Adherence Review: Is the patient currently on a STATIN medication? Yes Is the patient currently on ACE/ARB medication? Yes Does the patient have >5 day gap between last estimated fill dates? CPP to review  Care Gaps: Last eye exam / Retinopathy Screening? Leodis Binet done  Last [employer] Visit? None noted  Last Diabetic Foot Exam? 09/09/21  Star Rating Drugs:  Medication:  Last Fill: Day Supply Farxiga 10 mg daily (PAP) Ozempic 2 mg weekly (PAP)  [name], [place] Pharmacist Assistant  [phone]

## 2022-01-20 ENCOUNTER — Other Ambulatory Visit: Payer: Self-pay | Admitting: Family Medicine

## 2022-01-20 ENCOUNTER — Telehealth: Payer: Self-pay

## 2022-01-20 DIAGNOSIS — J0100 Acute maxillary sinusitis, unspecified: Secondary | ICD-10-CM | POA: Diagnosis not present

## 2022-01-20 DIAGNOSIS — H6502 Acute serous otitis media, left ear: Secondary | ICD-10-CM | POA: Diagnosis not present

## 2022-01-20 MED ORDER — DAPAGLIFLOZIN PROPANEDIOL 10 MG PO TABS: 10.0000 mg | ORAL_TABLET | Freq: Every day | ORAL | 3 refills | Status: DC

## 2022-01-20 NOTE — Chronic Care Management (AMB) (Signed)
Chronic Care Management Pharmacy Assistant   Name: Shawn Sullivan  MRN: [id number] DOB: [birth year]  Reason for Encounter: Patient Assistance Coordination  01/20/2022- Daughter [name] came into the office inquiring about patient's Ozempic, Tresiba, Farxiga and [place]. [name], CMA retrieved patient assistance medication Ozempic, [name] had not arrived yet. Daughter stated she called [facility] last week and they were needing a new prescription for patient's [name] and Farxiga. Breztri prescription sent to [facility] on 01/06/2022. Requested Farxiga 10 mg prescription to be sent to [facility] today. Spoke with Dr Tobie Poet regarding samples availability to give patient until patient assistance medication arrives and new prescription needed for [place]. Dr Tobie Poet sent prescription of [name] and I was given permission by Dr Tobie Poet to give [name] and [place] samples. Daughter aware to follow up with [facility] and to give about 1 week for [place] to come in from [facility] [employer].  Medications: Outpatient Encounter Medications as of 01/20/2022  Medication Sig   acetaminophen (TYLENOL) 500 MG tablet Take 1,000 mg by mouth every 6 (six) hours as needed for moderate pain or headache.   amiodarone (PACERONE) 200 MG tablet Take 1 tablet by mouth in the evening   amoxicillin-clavulanate (AUGMENTIN) 875-125 MG tablet Take 1 tablet by mouth 2 (two) times daily.   apixaban (ELIQUIS) 5 MG TABS tablet Take 5 mg by mouth 2 (two) times daily.    b complex vitamins tablet Take 1 tablet by mouth daily.   BD PEN NEEDLE MICRO U/F 32G X 6 MM MISC SMARTSIG:1 Needle SUB-Q Daily   Budeson-Glycopyrrol-Formoterol (BREZTRI AEROSPHERE) 160-9-4.8 MCG/ACT AERO Inhale 2 puffs into the lungs 2 (two) times daily.   cholecalciferol (VITAMIN D3) 25 MCG (1000 UT) tablet Take 1,000 Units by mouth daily.    CINNAMON PO Take 700 mg by mouth daily.    dapagliflozin propanediol (FARXIGA) 10 MG TABS tablet Take 1 tablet  (10 mg total) by mouth daily before breakfast.   folic acid (FOLVITE) 299 MCG tablet Take 800 mcg by mouth daily.   HYDROcodone-acetaminophen (NORCO) 10-325 MG tablet Take 1 tablet by mouth every 4 (four) hours as needed for moderate pain.   insulin degludec (TRESIBA FLEXTOUCH) 100 UNIT/ML FlexTouch Pen Inject 50 Units into the skin daily.   ipratropium-albuterol (DUONEB) 0.5-2.5 (3) MG/3ML SOLN Take 3 mLs by nebulization every 6 (six) hours as needed (shortness of breath).   levocetirizine (XYZAL) 5 MG tablet Take 5 mg by mouth daily as needed for allergies.   magnesium chloride (SLOW-MAG) 64 MG TBEC SR tablet Take 1 tablet by mouth daily.   metolazone (ZAROXOLYN) 5 MG tablet Take 5 mg by mouth once a week. Uses prn if swollen once weekly.   metoprolol tartrate (LOPRESSOR) 50 MG tablet TAKE 1 TABLET BY MOUTH IN THE MORNING, AT NOON AND AT BEDTIME   montelukast (SINGULAIR) 10 MG tablet TAKE 1 TABLET BY MOUTH ONCE DAILY IN THE EVENING   Omeprazole 20 MG TBEC Take 20 mg by mouth 2 (two) times daily before a meal.    Potassium Chloride ER 20 MEQ TBCR Take 1 tablet by mouth 2 (two) times daily.   rOPINIRole (REQUIP) 0.5 MG tablet TAKE 1 TABLET BY MOUTH AT BEDTIME   rosuvastatin (CRESTOR) 40 MG tablet Take 1 tablet (40 mg total) by mouth daily.   Semaglutide (OZEMPIC, 1 MG/DOSE, [place]) Inject 1 mg into the skin 2 (two) times a week.   sertraline (ZOLOFT) 50 MG tablet Take 1 tablet by mouth twice daily  tamsulosin (FLOMAX) 0.4 MG CAPS capsule Take 1 capsule by mouth at bedtime   torsemide (DEMADEX) 20 MG tablet Take 40 mg by mouth 2 (two) times daily.   VENTOLIN HFA 108 (90 Base) MCG/ACT inhaler INHALE 2 PUFFS BY MOUTH EVERY 4 HOURS AS NEEDED FOR WHEEZING FOR SHORTNESS OF BREATH   No facility-administered encounter medications on file as of 01/20/2022.   Pattricia Boss, [place] Pharmacist Assistant [phone]

## 2022-01-23 ENCOUNTER — Other Ambulatory Visit: Payer: Medicare HMO

## 2022-01-23 ENCOUNTER — Other Ambulatory Visit: Payer: Self-pay

## 2022-01-23 DIAGNOSIS — N1832 Chronic kidney disease, stage 3b: Secondary | ICD-10-CM

## 2022-01-23 DIAGNOSIS — D631 Anemia in chronic kidney disease: Secondary | ICD-10-CM | POA: Diagnosis not present

## 2022-01-23 DIAGNOSIS — I129 Hypertensive chronic kidney disease with stage 1 through stage 4 chronic kidney disease, or unspecified chronic kidney disease: Secondary | ICD-10-CM

## 2022-01-23 DIAGNOSIS — N189 Chronic kidney disease, unspecified: Secondary | ICD-10-CM | POA: Diagnosis not present

## 2022-01-24 LAB — RENAL FUNCTION PANEL
Albumin: 4.1 g/dL (ref 3.7–4.7)
BUN/Creatinine Ratio: 13 (ref 10–24)
BUN: 23 mg/dL (ref 8–27)
CO2: 30 mmol/L — ABNORMAL HIGH (ref 20–29)
Calcium: 10 mg/dL (ref 8.6–10.2)
Chloride: 99 mmol/L (ref 96–106)
Creatinine, Ser: 1.75 mg/dL — ABNORMAL HIGH (ref 0.76–1.27)
Glucose: 150 mg/dL — ABNORMAL HIGH (ref 70–99)
Phosphorus: 3.1 mg/dL (ref 2.8–4.1)
Potassium: 3.5 mmol/L (ref 3.5–5.2)
Sodium: 145 mmol/L — ABNORMAL HIGH (ref 134–144)
eGFR: 40 mL/min/{1.73_m2} — ABNORMAL LOW (ref >59)

## 2022-01-24 LAB — CBC WITH DIFF/PLATELET
Basophils Absolute: 0.1 10*3/uL (ref 0.0–0.2)
Basos: 1 %
EOS (ABSOLUTE): 0.3 10*3/uL (ref 0.0–0.4)
Eos: 3 %
Hematocrit: 41.7 % (ref 37.5–51.0)
Hemoglobin: 13.1 g/dL (ref 13.0–17.7)
Immature Grans (Abs): 0 10*3/uL (ref 0.0–0.1)
Immature Granulocytes: 0 %
Lymphocytes Absolute: 2.3 10*3/uL (ref 0.7–3.1)
Lymphs: 21 %
MCH: 26.9 pg (ref 26.6–33.0)
MCHC: 31.4 g/dL — ABNORMAL LOW (ref 31.5–35.7)
MCV: 86 fL (ref 79–97)
Monocytes Absolute: 0.8 10*3/uL (ref 0.1–0.9)
Monocytes: 7 %
Neutrophils Absolute: 7.2 10*3/uL — ABNORMAL HIGH (ref 1.4–7.0)
Neutrophils: 68 %
Platelets: 208 10*3/uL (ref 150–450)
RBC: 4.87 x10E6/uL (ref 4.14–5.80)
RDW: 14.5 % (ref 11.6–15.4)
WBC: 10.6 10*3/uL (ref 3.4–10.8)

## 2022-01-24 LAB — IRON,TIBC AND FERRITIN PANEL
Ferritin: 16 ng/mL — ABNORMAL LOW (ref 30–400)
Iron Saturation: 10 % — ABNORMAL LOW (ref 15–55)
Iron: 37 ug/dL — ABNORMAL LOW (ref 38–169)
Total Iron Binding Capacity: 353 ug/dL (ref 250–450)
UIBC: 316 ug/dL (ref 111–343)

## 2022-01-24 LAB — PARATHYROID HORMONE, INTACT (NO CA): PTH: 86 pg/mL — ABNORMAL HIGH (ref 15–65)

## 2022-01-24 LAB — VITAMIN D 25 HYDROXY (VIT D DEFICIENCY, FRACTURES): Vit D, 25-Hydroxy: 29.4 ng/mL — ABNORMAL LOW (ref 30.0–100.0)

## 2022-02-03 ENCOUNTER — Other Ambulatory Visit: Payer: Self-pay

## 2022-02-03 ENCOUNTER — Ambulatory Visit (INDEPENDENT_AMBULATORY_CARE_PROVIDER_SITE_OTHER): Payer: Medicare HMO

## 2022-02-03 DIAGNOSIS — I482 Chronic atrial fibrillation, unspecified: Secondary | ICD-10-CM

## 2022-02-03 DIAGNOSIS — E785 Hyperlipidemia, unspecified: Secondary | ICD-10-CM

## 2022-02-03 DIAGNOSIS — E0842 Diabetes mellitus due to underlying condition with diabetic polyneuropathy: Secondary | ICD-10-CM

## 2022-02-03 DIAGNOSIS — Z794 Long term (current) use of insulin: Secondary | ICD-10-CM

## 2022-02-03 DIAGNOSIS — J449 Chronic obstructive pulmonary disease, unspecified: Secondary | ICD-10-CM

## 2022-02-03 NOTE — Progress Notes (Cosign Needed)
Chronic Care Management Pharmacy Note  02/03/2022 Name:  Shawn Sullivan MRN:  [id number] DOB:  July 23, 1946   Plan Recommendations:  Sugars are very non-controlled, will test daily fasting for 1 week and CCM will reach out in 7 days Recommend ACE/ARB due to kidneys. Has Hx of hypotension so recommend low dose Will renew PAP in December Unable to conduct PHQ9 due to patient's daughter and wife being present and on speakerphone, recommend doing privately   Subjective: Shawn Sullivan is an 76 y.o. year old male who is a primary patient of Cox, Kirsten, MD.  The CCM team was consulted for assistance with disease management and care coordination needs.    Engaged with patient by telephone for follow up visit in response to provider referral for pharmacy case management and/or care coordination services.   Consent to Services:  The patient was given information about Chronic Care Management services, agreed to services, and gave verbal consent prior to initiation of services.  Please see initial visit note for detailed documentation.   Patient Care Team: Rochel Brome, MD as PCP - General (Family Medicine) Rosita Fire, MD as Consulting Physician (Nephrology) Lane Hacker, [facility] (Pharmacist)  Recent office visits:  12/31/21 Orders Only. Increased Rosuvastatin from 68m to 444mdaily.    12/24/21 CoRochel BromeD. Seen for DM, HTN. Started on Augmentin 875-12560m   Recent consult visits:  12/19/21 Orders Only. (Pain Management) Ordered Norco 10-325m38m Hospital visits:  None    Objective:  Lab Results  Component Value Date   CREATININE 1.75 (H) 01/23/2022   BUN 23 01/23/2022   GFRNONAA 52 (L) 12/10/2020   GFRAA 60 12/10/2020   NA 145 (H) 01/23/2022   K 3.5 01/23/2022   CALCIUM 10.0 01/23/2022   CO2 30 (H) 01/23/2022    Lab Results  Component Value Date/Time   HGBA1C 7.5 (H) 12/24/2021 01:15 PM   HGBA1C 6.4 (H) 09/09/2021 11:10 AM   MICROALBUR 80  01/14/2021 11:53 AM   MICROALBUR 150 05/31/2020 11:09 AM    Last diabetic Eye exam: No results found for: HMDIABEYEEXA  Last diabetic Foot exam: No results found for: HMDIABFOOTEX   Lab Results  Component Value Date   CHOL 154 12/24/2021   HDL 36 (L) 12/24/2021   LDLCALC 87 12/24/2021   TRIG 177 (H) 12/24/2021   CHOLHDL 4.3 12/24/2021    Hepatic Function Latest Ref Rng & Units 01/23/2022 12/24/2021 09/09/2021  Total Protein 6.0 - 8.5 g/dL - 6.3 6.2  Albumin 3.7 - 4.7 g/dL 4.1 4.1 4.2  AST 0 - 40 IU/L - 35 22  ALT 0 - 44 IU/L - 26 17  Alk Phosphatase 44 - 121 IU/L - 116 130(H)  Total Bilirubin 0.0 - 1.2 mg/dL - 0.7 0.8    Lab Results  Component Value Date/Time   TSH 2.670 07/02/2021 11:42 AM   FREET4 1.44 07/02/2021 11:42 AM    CBC Latest Ref Rng & Units 01/23/2022 12/24/2021 09/09/2021  WBC 3.4 - 10.8 x10E3/uL 10.6 10.4 11.1(H)  Hemoglobin 13.0 - 17.7 g/dL 13.1 13.1 12.8(L)  Hematocrit 37.5 - 51.0 % 41.7 40.7 41.7  Platelets 150 - 450 x10E3/uL 208 159 191    Lab Results  Component Value Date/Time   VD25OH 29.4 (L) 01/23/2022 11:52 AM   VD25OH 29.5 (L) 07/18/2020 02:32 PM    Clinical ASCVD: No  The 10-year ASCVD risk score (Arnett DK, et al., 2019) is: 58%   Values used to calculate the  score:     Age: 76 years     Sex: Male     Is Non-Hispanic African American: No     Diabetic: Yes     Tobacco smoker: No     Systolic Blood Pressure: 161 mmHg     Is BP treated: Yes     HDL Cholesterol: 36 mg/dL     Total Cholesterol: 154 mg/dL    Depression screen [facility] 2/9 12/24/2021 09/09/2021 01/14/2021  Decreased Interest 0 0 0  Down, Depressed, Hopeless 0 0 1  PHQ - 2 Score 0 0 1  Altered sleeping - 3 3  Tired, decreased energy - 3 3  Change in appetite - 3 3  Feeling bad or failure about yourself  - 0 1  Trouble concentrating - 0 1  Moving slowly or fidgety/restless - 0 2  Suicidal thoughts - 0 0  PHQ-9 Score - 9 14  Difficult doing work/chores - Not difficult at all  Somewhat difficult     Social History   Tobacco Use  Smoking Status Former   Types: Cigarettes   Quit date: 1993   Years since quitting: 30.1  Smokeless Tobacco Never   BP Readings from Last 3 Encounters:  12/24/21 138/70  09/09/21 132/76  07/02/21 130/72   Pulse Readings from Last 3 Encounters:  12/24/21 72  09/09/21 75  07/02/21 74   Wt Readings from Last 3 Encounters:  12/24/21 (!) 321 lb (145.6 kg)  09/09/21 (!) 319 lb (144.7 kg)  07/02/21 (!) 322 lb (146.1 kg)    Assessment/Interventions: Review of patient past medical history, allergies, medications, health status, including review of consultants reports, laboratory and other test data, was performed as part of comprehensive evaluation and provision of chronic care management services.   SDOH:  (Social Determinants of Health) assessments and interventions performed: Yes SDOH Interventions    Flowsheet Row Most Recent Value  SDOH Interventions   Financial Strain Interventions Other (Comment)  [PAP (See CP)]  Transportation Interventions Intervention Not Indicated       CCM Care Plan  Allergies  Allergen Reactions   Androgel [Testosterone] Other (See Comments)    Pedal edema    Biaxin [Clarithromycin] Other (See Comments)    Taste perception alteration   Duloxetine Other (See Comments)    Hallucinations   Gabapentin Other (See Comments)    hallucinations   Ibuprofen Other (See Comments)    GI upset    Lyrica [Pregabalin] Swelling   Spironolactone Other (See Comments)    Medications Reviewed Today     Reviewed by Lane Hacker, [facility] (Pharmacist) on 02/03/22 at 1500  Med List Status: <None>   Medication Order Taking? Sig Documenting Provider Last Dose Status Informant  acetaminophen (TYLENOL) 500 MG tablet [id number]  Take 1,000 mg by mouth every 6 (six) hours as needed for moderate pain or headache. [provider]  Active Family Member  amiodarone (PACERONE) 200 MG tablet [id number]   Take 1 tablet by mouth in the evening Richardo Priest, MD  Active   amoxicillin-clavulanate (AUGMENTIN) 875-125 MG tablet [id number]  Take 1 tablet by mouth 2 (two) times daily. Cox, Kirsten, MD  Active   apixaban (ELIQUIS) 5 MG TABS tablet [id number]  Take 5 mg by mouth 2 (two) times daily.  [provider]  Active Family Member  b complex vitamins tablet [id number]  Take 1 tablet by mouth daily. [provider]  Active   BD PEN NEEDLE MICRO U/F 32G X 6 MM  [place] [id number]  SMARTSIG:1 Needle SUB-Q Daily [provider]  Active   Budeson-Glycopyrrol-Formoterol (BREZTRI AEROSPHERE) 160-9-4.8 MCG/ACT AERO [id number]  Inhale 2 puffs into the lungs 2 (two) times daily. Cox, Kirsten, MD  Active   cholecalciferol (VITAMIN D3) 25 MCG (1000 UT) tablet [id number]  Take 1,000 Units by mouth daily.  [provider]  Active   CINNAMON PO [id number]  Take 700 mg by mouth daily.  [provider]  Active Family Member  dapagliflozin propanediol (FARXIGA) 10 MG TABS tablet [id number]  Take 1 tablet (10 mg total) by mouth daily before breakfast. Cox, Kirsten, MD  Active   folic acid (FOLVITE) 026 MCG tablet [id number]  Take 800 mcg by mouth daily. [provider]  Active   HYDROcodone-acetaminophen (NORCO) 10-325 MG tablet [id number]  Take 1 tablet by mouth every 4 (four) hours as needed for moderate pain. [provider]  Active   insulin degludec (TRESIBA FLEXTOUCH) 100 UNIT/ML FlexTouch Pen [id number] Yes Inject 55 Units into the skin daily. [provider] Taking Active   ipratropium-albuterol (DUONEB) 0.5-2.5 (3) MG/3ML SOLN [id number]  Take 3 mLs by nebulization every 6 (six) hours as needed (shortness of breath). Cox, Kirsten, MD  Active   levocetirizine (XYZAL) 5 MG tablet [id number]  Take 5 mg by mouth daily as needed for allergies. [provider]  Active Family Member  magnesium chloride (SLOW-MAG) 64 MG TBEC SR tablet [id number]  Take 1 tablet  by mouth daily. [provider]  Active   metolazone (ZAROXOLYN) 5 MG tablet [id number]  Take 5 mg by mouth once a week. Uses prn if swollen once weekly. [provider]  Active   metoprolol tartrate (LOPRESSOR) 50 MG tablet [id number] Yes TAKE 1 TABLET BY MOUTH IN THE MORNING, AT NOON AND AT BEDTIME Richardo Priest, MD Taking Active   montelukast (SINGULAIR) 10 MG tablet [id number]  TAKE 1 TABLET BY MOUTH ONCE DAILY IN THE Julio Alm, Kirsten, MD  Active   Omeprazole 20 MG TBEC [id number]  Take 20 mg by mouth 2 (two) times daily before a meal.  [provider]  Active Family Member  Potassium Chloride ER 20 MEQ TBCR [id number]  Take 1 tablet by mouth 2 (two) times daily. [provider]  Active   rOPINIRole (REQUIP) 0.5 MG tablet [id number]  TAKE 1 TABLET BY MOUTH AT BEDTIME Cox, Kirsten, MD  Active   rosuvastatin (CRESTOR) 40 MG tablet [id number] Yes Take 1 tablet (40 mg total) by mouth daily. Cox, Kirsten, MD Taking Active   Semaglutide [facility], 1 MG/DOSE, [place]) [id number] Yes Inject 1 mg into the skin 2 (two) times a week. [provider] Taking Active   sertraline (ZOLOFT) 50 MG tablet [id number]  Take 1 tablet by mouth twice daily Marge Duncans, PA-C  Active   tamsulosin [facility]) 0.4 MG CAPS capsule [id number]  Take 1 capsule by mouth at bedtime Marge Duncans, PA-C  Active   torsemide (DEMADEX) 20 MG tablet [id number]  Take 40 mg by mouth 2 (two) times daily. [provider]  Active   VENTOLIN HFA 108 (90 Base) MCG/ACT inhaler [id number]  INHALE 2 PUFFS BY MOUTH EVERY 4 HOURS AS NEEDED FOR WHEEZING FOR SHORTNESS OF Jacques Earthly, MD  Active             Patient Active Problem List   Diagnosis Date Noted   Acquired thrombophilia ([place]) 25/00/3704   Uncomplicated opioid dependence ([place]) 12/24/2021   Acute sinusitis 10/17/2020   Atrial fibrillation,  chronic ([place]) 05/31/2020   BMI 45.0-49.9, adult ([place]) 04/11/2020   Balanitis 04/11/2020    Diabetes mellitus due to underlying condition, controlled, with diabetic polyneuropathy, with long-term current use of insulin ([place]) 02/23/2020   Gastroesophageal reflux disease without esophagitis 02/23/2020   Chronic anticoagulation 06/14/2019   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 stage 3b chronic kidney disease ([place]) 06/14/2019   Medication management 04/19/2019   On amiodarone therapy 03/24/2019   Polyneuropathy associated with underlying disease ([place]) 10/11/2018   Hypokalemia 10/04/2018   PAF (paroxysmal atrial fibrillation) ([place]) 07/01/2018   CKD (chronic kidney disease) stage 3, GFR 30-59 ml/min (HCC) 07/01/2018   COPD (chronic obstructive pulmonary disease) ([place]) 07/01/2018   Obstructive sleep apnea 07/01/2018   Edema of both lower extremities 06/20/2018   Class 3 severe obesity due to excess calories with serious comorbidity and body mass index (BMI) of 40.0 to 44.9 in adult ([place]) 10/04/2017   Chronic pain syndrome 08/30/2017   Restrictive lung disease 06/22/2016   Generalized edema 06/22/2016   Restless legs syndrome 03/23/2016   Presbyopia of both eyes 12/16/2015   Hyperlipidemia 12/16/2015   Bilateral pseudophakia 12/16/2015   Dermatochalasis 12/16/2015   Chronic obstructive pulmonary disease ([place]) 11/05/2015   Edema 11/04/2015   Piriformis syndrome 05/03/2014   Disorder of bursae and tendons in shoulder region 01/05/2014   Low back pain 08/17/2013   Sleep apnea 05/06/2013   Postlaminectomy syndrome, lumbar region 05/06/2013   Lumbosacral spondylosis 05/06/2013   Depressive disorder 05/06/2013   Chronic pain 05/06/2013    Immunization History  Administered Date(s) Administered   Fluad Quad(high Dose 65+) 10/11/2020, 09/09/2021   Influenza-Unspecified 08/16/2019   Moderna Covid-19 Vaccine Bivalent Booster 18yr & up 09/09/2021   Moderna SARS-COV2 Booster Vaccination 12/10/2020   Moderna  Sars-Covid-2 Vaccination 12/22/2019, 01/19/2020   Pneumococcal Conjugate-13 07/26/2014   Pneumococcal Polysaccharide-23 08/19/2012    Conditions to be addressed/monitored:  Hypertension, Hyperlipidemia, Diabetes, Atrial Fibrillation, Heart Failure, GERD, COPD, Chronic Kidney Disease and Restless Leg Syndrome  Care Plan : CCM Pharmacy Care Plan  Updates made by KLane Hacker RPH since 02/03/2022 12:00 AM     Problem: dm, afib, chf, hld   Priority: High  Onset Date: 02/07/2021     Long-Range Goal: Disease Management   Start Date: 02/07/2021  Expected End Date: 02/07/2022  Recent Progress: On track  Priority: High  Note:    Current Barriers:  Unable to achieve control of diabetes   Pharmacist Clinical Goal(s):  Over the next 90 days, patient will verbalize ability to afford treatment regimen achieve adherence to monitoring guidelines and medication adherence to achieve therapeutic efficacy achieve control of diabetes as evidenced by a1c through collaboration with PharmD and provider.   Interventions: 1:1 collaboration with CRochel Brome MD regarding development and update of comprehensive plan of care as evidenced by provider attestation and co-signature Inter-disciplinary care team collaboration (see longitudinal plan of care) Comprehensive medication review performed; medication list updated in electronic medical record  Hyperlipidemia: (LDL goal < 70 Lab Results  Component Value Date   CHOL 154 12/24/2021   CHOL 149 09/09/2021   CHOL 126 12/10/2020   Lab Results  Component Value Date   HDL 36 (L) 12/24/2021   HDL 35 (L) 09/09/2021   HDL 35 (L) 12/10/2020   Lab Results  Component Value Date   LDLCALC 87 12/24/2021   LPanola80 09/09/2021   LDulce65 12/10/2020   Lab Results  Component  Value Date   TRIG 177 (H) 12/24/2021   TRIG 203 (H) 09/09/2021   TRIG 148 12/10/2020   Lab Results  Component Value Date   CHOLHDL 4.3 12/24/2021   CHOLHDL 4.3 09/09/2021    CHOLHDL 3.6 12/10/2020  No results found for: LDLDIRECT -Controlled -Current treatment: Rosuvastatin 40 mg daily Appropriate, Effective, Safe, Accessible -Medications previously tried: atorvastatin  -Current dietary patterns: daughter tries to get some healthy options when she cooks for them.  -Current exercise habits: minimal due to mobility and back pain  -Educated on Cholesterol goals;  Benefits of statin for ASCVD risk reduction; Importance of limiting foods high in cholesterol; -Counseled on diet and exercise extensively Recommended to continue current medication  Diabetes (A1c goal <8%) Lab Results  Component Value Date   HGBA1C 7.5 (H) 12/24/2021   HGBA1C 6.4 (H) 09/09/2021   HGBA1C 9.4 (H) 12/10/2020   Lab Results  Component Value Date   MICROALBUR 80 01/14/2021   LDLCALC 87 12/24/2021   CREATININE 1.75 (H) 01/23/2022   Lab Results  Component Value Date   NA 145 (H) 01/23/2022   K 3.5 01/23/2022   CREATININE 1.75 (H) 01/23/2022   EGFR 40 (L) 01/23/2022   GFRNONAA 52 (L) 12/10/2020   GLUCOSE 150 (H) 01/23/2022   Lab Results  Component Value Date   WBC 10.6 01/23/2022   HGB 13.1 01/23/2022   HCT 41.7 01/23/2022   MCV 86 01/23/2022   PLT 208 01/23/2022   Wt Readings from Last 3 Encounters:  12/24/21 (!) 321 lb (145.6 kg)  09/09/21 (!) 319 lb (144.7 kg)  07/02/21 (!) 322 lb (146.1 kg)  -Controlled -Current medications: Farxiga 10 mg daily (PAP) Appropriate, Effective, Safe, Accessible Tresiba 55 units daily (PAP) Appropriate, Effective, Safe, Accessible Ozempic 1g twice/week (2 mg weekly) (PAP) Appropriate, Effective, Safe, Accessible -Medications previously tried:  glipizide, metformin, Soliqua -Current home glucose readings fasting glucose:  September 2022: Daughter doesn't have logs with her March 2023: Hasn't tested this month, was 158 today fasting on the phone. Was (226, 178, 194, 167, 01/11/22: 334) last month post prandial glucose: not  reproted -Denies hypoglycemic/hyperglycemic symptoms -Current meal patterns:  Patient sneaks cookies and snacks. Daughter tries to prepare a balanced meal when cooking for patient. Patient's appetite fluctuates per daughter.  -Current exercise: minimal  -Educated on A1c and blood sugar goals; Complications of diabetes including kidney damage, retinal damage, and cardiovascular disease; Benefits of routine self-monitoring of blood sugar; -Counseled to check feet daily and get yearly eye exams -Counseled on diet and exercise extensively September 2022: Patient needs A1c. Daughter will call and schedule ASAP, I will also verify with front desk. Will have concierge check in monthly (Daughter affirmed plan) to get these sugars under control. Patient is a candidate for Metformin if further medication needed -Candidate for ACE/ARB due to Microalbumin: 80 March 2023: Candidate for ACE/ARB. Will have patient check sugars daily and concierge will reach out in 1 week  Atrial Fibrillation (Goal: prevent stroke and major bleeding) -Managed by Dr. Bettina Gavia BP Readings from Last 3 Encounters:  12/24/21 138/70  09/09/21 132/76  07/02/21 130/72  -Controlled -CHADSVASC: 5 -Current treatment: Amiodarone 200 mg daily in the evening Appropriate, Effective, Safe, Accessible Metoprolol 50 mg morning, noon and bedtime Appropriate, Effective, Safe, Accessible Anticoagulation:  Eliquis 5 mg bid  (PAP denied 2022) Appropriate, Effective, Safe, Query accessible -Medications previously tried: none reported -Home BP and HR readings:   March 2023: Doesn't test -Home Weight readings:   March 2023:  Doesn't test -Counseled on increased risk of stroke due to Afib and benefits of anticoagulation for stroke prevention; importance of adherence to anticoagulant exactly as prescribed; Pharmacist will coordinate patient assistance application once 3% spent on medications.   -Counseled on diet and exercise  extensively Recommended to continue current medication September 2022: Will renew PAP in December 2022  Heart Failure (Goal: manage symptoms and prevent exacerbations) Lab Results  Component Value Date   K 3.5 01/23/2022  -Controlled -Last ejection fraction: 60-65% (Date: 10/21) -HF type: Diastolic -Current treatment: Torsemide 40 mg bid Appropriate, Effective, Safe, Accessible Kcl 29mq BID Appropriate, Effective, Safe, Accessible Metoprolol tartrate 50 mg tid Appropriate, Effective, Safe, Accessible Metolazone 5 mg once weekly if needed Appropriate, Effective, Safe, Accessible -Medications previously tried: furosemide  -Current home BP/HR readings:  March 2023: Doesn't test -Current dietary habits: daughter tries to provide healthy options  -Current exercise habits: limited by mobility and pain  -Educated on Benefits of medications for managing symptoms and prolonging life Importance of blood pressure control -Counseled on diet and exercise extensively Recommended to continue current medication Due for Echo August 2023 per Cardio  COPD (Goal: control symptoms and prevent exacerbations) CAT Score 02/03/2022  Total CAT Score 38  -Not ideally Controlled Pulmonary Functions Testing Results:  No results found for: FEV1, FVC, FEV1FVC, TLC, DLCO -No exacerbations in past year -GOLD: Class B -GOLD: Grade unknown -Current treatment  Breztri 2 puffs bid (PAP) Appropriate, Effective, Safe, Query accessible Ipratropium 3 mls every 6 hours prn Appropriate, Effective, Safe, Accessible Montelukast 10 mg daily Appropriate, Effective, Safe, Accessible Ventolin 2 puffs every 4 hours prn wheezing or shortness of breath Appropriate, Effective, Safe, Accessible Levocetirizine 546mAppropriate, Effective, Safe, Accessible -Medications previously tried: trelegy  -Patient reports consistent use of maintenance inhaler -Frequency of rescue inhaler use: weekly  -Counseled on Proper inhaler  technique; Benefits of consistent maintenance inhaler use September 2022: Will renew PAP in December 2022 March 2023: Hasn't been able to get BrJudithann Saugerhis year, daughter stated should get from Pap within 5-7 days. No exacerbations in past year (No Steroids ordered) and no hospitalizations, maintain therapy as is  Patient Goals/Self-Care Activities Over the next 90 days, patient will:  - take medications as prescribed  Follow Up Plan: Telephone follow up appointment with care management team member scheduled for: Sept 2023 and monthly from [place]. - [phone]       Medication Assistance:  BrKoleen DistanceTresibaobtained through [facility] and [employer]  medication assistance program.  Enrollment ends 11/29/2021  , will renew for 2023  [facility]  [facility] [id number]: [phone]: [phone] [place] [address] [place] [id number]: [phone]: [phone] [id number]: [phone]: [phone] Address) - LiStokesdaleNJFort SupplyT Previously: PaLemar LoftyFl[place] [address] 4tGal[place]oni[place]hone: [phone]: 86[place]. [place]hone: [phone]: [phone] Uses pill box? Yes Pt endorses good compliance  We discussed: Benefits of medication synchronization, packaging and delivery as well as enhanced pharmacist oversight with [employer]. Patient decided to: Continue current medication management strategy  Care  Plan and Follow Up Patient Decision:  Patient agrees to Care Plan and Follow-up.  Plan: Telephone follow up appointment with care management team member scheduled for:  September 2023

## 2022-02-03 NOTE — Patient Instructions (Signed)
Visit Information   Goals Addressed   None    Patient Care Plan: CCM Pharmacy Care Plan     Problem Identified: dm, afib, chf, hld   Priority: High  Onset Date: 02/07/2021     Long-Range Goal: Disease Management   Start Date: 02/07/2021  Expected End Date: 02/07/2022  Recent Progress: On track  Priority: High  Note:    Current Barriers:  Unable to achieve control of diabetes   Pharmacist Clinical Goal(s):  Over the next 90 days, patient will verbalize ability to afford treatment regimen achieve adherence to monitoring guidelines and medication adherence to achieve therapeutic efficacy achieve control of diabetes as evidenced by a1c through collaboration with PharmD and provider.   Interventions: 1:1 collaboration with Rochel Brome, MD regarding development and update of comprehensive plan of care as evidenced by provider attestation and co-signature Inter-disciplinary care team collaboration (see longitudinal plan of care) Comprehensive medication review performed; medication list updated in electronic medical record  Hyperlipidemia: (LDL goal < 70 Lab Results  Component Value Date   CHOL 154 12/24/2021   CHOL 149 09/09/2021   CHOL 126 12/10/2020   Lab Results  Component Value Date   HDL 36 (L) 12/24/2021   HDL 35 (L) 09/09/2021   HDL 35 (L) 12/10/2020   Lab Results  Component Value Date   LDLCALC 87 12/24/2021   Craig 80 09/09/2021   LDLCALC 65 12/10/2020   Lab Results  Component Value Date   TRIG 177 (H) 12/24/2021   TRIG 203 (H) 09/09/2021   TRIG 148 12/10/2020   Lab Results  Component Value Date   CHOLHDL 4.3 12/24/2021   CHOLHDL 4.3 09/09/2021   CHOLHDL 3.6 12/10/2020  No results found for: LDLDIRECT -Controlled -Current treatment: Rosuvastatin 40 mg daily Appropriate, Effective, Safe, Accessible -Medications previously tried: atorvastatin  -Current dietary patterns: daughter tries to get some healthy options when she cooks for them.  -Current  exercise habits: minimal due to mobility and back pain  -Educated on Cholesterol goals;  Benefits of statin for ASCVD risk reduction; Importance of limiting foods high in cholesterol; -Counseled on diet and exercise extensively Recommended to continue current medication  Diabetes (A1c goal <8%) Lab Results  Component Value Date   HGBA1C 7.5 (H) 12/24/2021   HGBA1C 6.4 (H) 09/09/2021   HGBA1C 9.4 (H) 12/10/2020   Lab Results  Component Value Date   MICROALBUR 80 01/14/2021   LDLCALC 87 12/24/2021   CREATININE 1.75 (H) 01/23/2022   Lab Results  Component Value Date   NA 145 (H) 01/23/2022   K 3.5 01/23/2022   CREATININE 1.75 (H) 01/23/2022   EGFR 40 (L) 01/23/2022   GFRNONAA 52 (L) 12/10/2020   GLUCOSE 150 (H) 01/23/2022   Lab Results  Component Value Date   WBC 10.6 01/23/2022   HGB 13.1 01/23/2022   HCT 41.7 01/23/2022   MCV 86 01/23/2022   PLT 208 01/23/2022   Wt Readings from Last 3 Encounters:  12/24/21 (!) 321 lb (145.6 kg)  09/09/21 (!) 319 lb (144.7 kg)  07/02/21 (!) 322 lb (146.1 kg)  -Controlled -Current medications: Farxiga 10 mg daily (PAP) Appropriate, Effective, Safe, Accessible Tresiba 55 units daily (PAP) Appropriate, Effective, Safe, Accessible Ozempic 1g twice/week (2 mg weekly) (PAP) Appropriate, Effective, Safe, Accessible -Medications previously tried:  glipizide, metformin, Soliqua -Current home glucose readings fasting glucose:  September 2022: Daughter doesn't have logs with her March 2023: Hasn't tested this month, was 158 today fasting on the phone. Was (226, 178,  194, 167, 01/11/22: 334) last month post prandial glucose: not reproted -Denies hypoglycemic/hyperglycemic symptoms -Current meal patterns:  Patient sneaks cookies and snacks. Daughter tries to prepare a balanced meal when cooking for patient. Patient's appetite fluctuates per daughter.  -Current exercise: minimal  -Educated on A1c and blood sugar goals; Complications of  diabetes including kidney damage, retinal damage, and cardiovascular disease; Benefits of routine self-monitoring of blood sugar; -Counseled to check feet daily and get yearly eye exams -Counseled on diet and exercise extensively September 2022: Patient needs A1c. Daughter will call and schedule ASAP, I will also verify with front desk. Will have concierge check in monthly (Daughter affirmed plan) to get these sugars under control. Patient is a candidate for Metformin if further medication needed -Candidate for ACE/ARB due to Microalbumin: 80 March 2023: Candidate for ACE/ARB. Will have patient check sugars daily and concierge will reach out in 1 week  Atrial Fibrillation (Goal: prevent stroke and major bleeding) -Managed by Dr. Bettina Gavia BP Readings from Last 3 Encounters:  12/24/21 138/70  09/09/21 132/76  07/02/21 130/72  -Controlled -CHADSVASC: 5 -Current treatment: Amiodarone 200 mg daily in the evening Appropriate, Effective, Safe, Accessible Metoprolol 50 mg morning, noon and bedtime Appropriate, Effective, Safe, Accessible Anticoagulation:  Eliquis 5 mg bid  (PAP denied 2022) Appropriate, Effective, Safe, Query accessible -Medications previously tried: none reported -Home BP and HR readings:   March 2023: Doesn't test -Home Weight readings:   March 2023: Doesn't test -Counseled on increased risk of stroke due to Afib and benefits of anticoagulation for stroke prevention; importance of adherence to anticoagulant exactly as prescribed; Pharmacist will coordinate patient assistance application once 3% spent on medications.   -Counseled on diet and exercise extensively Recommended to continue current medication September 2022: Will renew PAP in December 2022  Heart Failure (Goal: manage symptoms and prevent exacerbations) Lab Results  Component Value Date   K 3.5 01/23/2022  -Controlled -Last ejection fraction: 60-65% (Date: 10/21) -HF type: Diastolic -Current  treatment: Torsemide 40 mg bid Appropriate, Effective, Safe, Accessible Kcl 2mq BID Appropriate, Effective, Safe, Accessible Metoprolol tartrate 50 mg tid Appropriate, Effective, Safe, Accessible Metolazone 5 mg once weekly if needed Appropriate, Effective, Safe, Accessible -Medications previously tried: furosemide  -Current home BP/HR readings:  March 2023: Doesn't test -Current dietary habits: daughter tries to provide healthy options  -Current exercise habits: limited by mobility and pain  -Educated on Benefits of medications for managing symptoms and prolonging life Importance of blood pressure control -Counseled on diet and exercise extensively Recommended to continue current medication Due for Echo August 2023 per Cardio  COPD (Goal: control symptoms and prevent exacerbations) CAT Score 02/03/2022  Total CAT Score 38  -Not ideally Controlled Pulmonary Functions Testing Results:  No results found for: FEV1, FVC, FEV1FVC, TLC, DLCO -No exacerbations in past year -GOLD: Class B -GOLD: Grade unknown -Current treatment  Breztri 2 puffs bid (PAP) Appropriate, Effective, Safe, Query accessible Ipratropium 3 mls every 6 hours prn Appropriate, Effective, Safe, Accessible Montelukast 10 mg daily Appropriate, Effective, Safe, Accessible Ventolin 2 puffs every 4 hours prn wheezing or shortness of breath Appropriate, Effective, Safe, Accessible Levocetirizine 575mAppropriate, Effective, Safe, Accessible -Medications previously tried: trelegy  -Patient reports consistent use of maintenance inhaler -Frequency of rescue inhaler use: weekly  -Counseled on Proper inhaler technique; Benefits of consistent maintenance inhaler use September 2022: Will renew PAP in December 2022 March 2023: Hasn't been able to get BrJudithann Saugerhis year, daughter stated should get from Pap within 5-7 days. No  exacerbations in past year (No Steroids ordered) and no hospitalizations, maintain therapy as is  Patient  Goals/Self-Care Activities Over the next 90 days, patient will:  - take medications as prescribed  Follow Up Plan: Telephone follow up appointment with care management team member scheduled for: Sept 2023 and monthly from [place], [place].D. - [phone]      Shawn Sullivan was given information about Chronic Care Management services today including:  CCM service includes personalized support from designated clinical staff supervised by his physician, including individualized plan of care and coordination with other care providers 24/7 contact phone numbers for assistance for urgent and routine care needs. Standard insurance, coinsurance, copays and deductibles apply for chronic care management only during months in which we provide at least 20 minutes of these services. Most insurances cover these services at 100%, however patients may be responsible for any copay, coinsurance and/or deductible if applicable. This service may help you avoid the need for more expensive face-to-face services. Only one practitioner may furnish and bill the service in a calendar month. The patient may stop CCM services at any time (effective at the end of the month) by phone call to the office staff.  Patient agreed to services and verbal consent obtained.   The patient verbalized understanding of instructions, educational materials, and care plan provided today and declined offer to receive copy of patient instructions, educational materials, and care plan.  The pharmacy team will reach out to the patient again over the next 60 days.   Lane Hacker, [place]

## 2022-02-04 DIAGNOSIS — G894 Chronic pain syndrome: Secondary | ICD-10-CM | POA: Diagnosis not present

## 2022-02-04 DIAGNOSIS — M961 Postlaminectomy syndrome, not elsewhere classified: Secondary | ICD-10-CM | POA: Diagnosis not present

## 2022-02-04 DIAGNOSIS — M533 Sacrococcygeal disorders, not elsewhere classified: Secondary | ICD-10-CM | POA: Diagnosis not present

## 2022-02-04 DIAGNOSIS — Z79899 Other long term (current) drug therapy: Secondary | ICD-10-CM | POA: Diagnosis not present

## 2022-02-05 DIAGNOSIS — E559 Vitamin D deficiency, unspecified: Secondary | ICD-10-CM | POA: Diagnosis not present

## 2022-02-05 DIAGNOSIS — I129 Hypertensive chronic kidney disease with stage 1 through stage 4 chronic kidney disease, or unspecified chronic kidney disease: Secondary | ICD-10-CM | POA: Diagnosis not present

## 2022-02-05 DIAGNOSIS — D631 Anemia in chronic kidney disease: Secondary | ICD-10-CM | POA: Diagnosis not present

## 2022-02-05 DIAGNOSIS — E669 Obesity, unspecified: Secondary | ICD-10-CM | POA: Diagnosis not present

## 2022-02-05 DIAGNOSIS — N1832 Chronic kidney disease, stage 3b: Secondary | ICD-10-CM | POA: Diagnosis not present

## 2022-02-05 DIAGNOSIS — N2581 Secondary hyperparathyroidism of renal origin: Secondary | ICD-10-CM | POA: Diagnosis not present

## 2022-02-05 DIAGNOSIS — E1122 Type 2 diabetes mellitus with diabetic chronic kidney disease: Secondary | ICD-10-CM | POA: Diagnosis not present

## 2022-02-05 DIAGNOSIS — R600 Localized edema: Secondary | ICD-10-CM | POA: Diagnosis not present

## 2022-02-05 DIAGNOSIS — E876 Hypokalemia: Secondary | ICD-10-CM | POA: Diagnosis not present

## 2022-02-10 ENCOUNTER — Telehealth: Payer: Self-pay

## 2022-02-10 NOTE — Chronic Care Management (AMB) (Signed)
? ? ?Chronic Care Management ?Pharmacy Assistant  ? ?Name: Shawn Sullivan  MRN: [id number] DOB: 10-05-1946 ? ? ?Reason for Encounter: Disease State call for DM  ?   ?Recent office visits:  ?None ? ?Recent consult visits:  ?02/04/22 (Pain Management) Shawn Sullivan. Seen for Sacroiliac Pain. Ordered Hydrocodone-APAP 10-'325mg'$ . ? ?Hospital visits:  ?None ? ?Medications: ?Outpatient Encounter Medications as of 02/10/2022  ?Medication Sig  ? acetaminophen (TYLENOL) 500 MG tablet Take 1,000 mg by mouth every 6 (six) hours as needed for moderate pain or headache.  ? amiodarone (PACERONE) 200 MG tablet Take 1 tablet by mouth in the evening  ? amoxicillin-clavulanate (AUGMENTIN) 875-125 MG tablet Take 1 tablet by mouth 2 (two) times daily. (Patient not taking: Reported on 02/03/2022)  ? apixaban (ELIQUIS) 5 MG TABS tablet Take 5 mg by mouth 2 (two) times daily.   ? b complex vitamins tablet Take 1 tablet by mouth daily.  ? BD PEN NEEDLE MICRO U/F 32G X 6 MM MISC SMARTSIG:1 Needle SUB-Q Daily  ? Budeson-Glycopyrrol-Formoterol (BREZTRI AEROSPHERE) 160-9-4.8 MCG/ACT AERO Inhale 2 puffs into the lungs 2 (two) times daily.  ? cholecalciferol (VITAMIN D3) 25 MCG (1000 UT) tablet Take 1,000 Units by mouth daily.   ? CINNAMON PO Take 700 mg by mouth daily.   ? dapagliflozin propanediol (FARXIGA) 10 MG TABS tablet Take 1 tablet (10 mg total) by mouth daily before breakfast.  ? folic acid (FOLVITE) 915 MCG tablet Take 800 mcg by mouth daily.  ? HYDROcodone-acetaminophen (NORCO) 10-325 MG tablet Take 1 tablet by mouth every 4 (four) hours as needed for moderate pain.  ? insulin degludec (TRESIBA FLEXTOUCH) 100 UNIT/ML FlexTouch Pen Inject 55 Units into the skin daily.  ? ipratropium-albuterol (DUONEB) 0.5-2.5 (3) MG/3ML SOLN Take 3 mLs by nebulization every 6 (six) hours as needed (shortness of breath).  ? levocetirizine (XYZAL) 5 MG tablet Take 5 mg by mouth daily as needed for allergies.  ? magnesium chloride (SLOW-MAG) 64 MG  TBEC SR tablet Take 1 tablet by mouth daily.  ? metolazone (ZAROXOLYN) 5 MG tablet Take 5 mg by mouth once a week. Uses prn if swollen once weekly.  ? metoprolol tartrate (LOPRESSOR) 50 MG tablet TAKE 1 TABLET BY MOUTH IN THE MORNING, AT NOON AND AT BEDTIME  ? montelukast (SINGULAIR) 10 MG tablet TAKE 1 TABLET BY MOUTH ONCE DAILY IN THE EVENING  ? Omeprazole 20 MG TBEC Take 20 mg by mouth 2 (two) times daily before a meal.   ? Potassium Chloride ER 20 MEQ TBCR Take 1 tablet by mouth 2 (two) times daily.  ? rOPINIRole (REQUIP) 0.5 MG tablet TAKE 1 TABLET BY MOUTH AT BEDTIME  ? rosuvastatin (CRESTOR) 40 MG tablet Take 1 tablet (40 mg total) by mouth daily.  ? Semaglutide (OZEMPIC, 1 MG/DOSE, [place]) Inject 1 mg into the skin 2 (two) times a week.  ? sertraline (ZOLOFT) 50 MG tablet Take 1 tablet by mouth twice daily  ? tamsulosin (FLOMAX) 0.4 MG CAPS capsule Take 1 capsule by mouth at bedtime  ? torsemide (DEMADEX) 20 MG tablet Take 40 mg by mouth 2 (two) times daily.  ? VENTOLIN HFA 108 (90 Base) MCG/ACT inhaler INHALE 2 PUFFS BY MOUTH EVERY 4 HOURS AS NEEDED FOR WHEEZING FOR SHORTNESS OF BREATH  ? ?No facility-administered encounter medications on file as of 02/10/2022.  ? ? ?Recent Relevant Labs: ?Lab Results  ?Component Value Date/Time  ? HGBA1C 7.5 (H) 12/24/2021 01:15 PM  ? HGBA1C 6.4 (  H) 09/09/2021 11:10 AM  ? MICROALBUR 80 01/14/2021 11:53 AM  ? MICROALBUR 150 05/31/2020 11:09 AM  ?  ?Kidney Function ?Lab Results  ?Component Value Date/Time  ? CREATININE 1.75 (H) 01/23/2022 11:52 AM  ? CREATININE 1.56 (H) 12/24/2021 01:15 PM  ? GFRNONAA 52 (L) 12/10/2020 01:42 PM  ? GFRAA 60 12/10/2020 01:42 PM  ? ? ? ?Current antihyperglycemic regimen:  ?Farxiga 10 mg daily (PAP) ?Tresiba 55 units daily (PAP) ?Ozempic 1g twice/week (2 mg weekly) PAP) ? ?Patient verbally confirms he is taking the above medications as directed. Yes ? ?What recent interventions/DTPs have been made to improve glycemic control:  ?Pt denies any changes   ? ?Have there been any recent hospitalizations or ED visits since last visit with CPP? No ? ?Patient denies hypoglycemic symptoms, including None ? ?Patient denies hyperglycemic symptoms, including none ? ?How often are you checking your blood sugar? in the morning before eating or drinking ? ?What are your blood sugars ranging?  ?Fasting: 02/02/22 120, 02/04/22 124, 02/05/22 125, 02/06/22 159, 02/07/22 147 ? ?On insulin? Yes ?How many units:55 units daily  ? ?During the week, how often does your blood glucose drop below 70? Never ? ?Are you checking your feet daily/regularly? Yes ? ?Adherence Review: ?Is the patient currently on a STATIN medication? Yes ?Is the patient currently on ACE/ARB medication? No ?Does the patient have >5 day gap between last estimated fill dates? CPP to review ? ?Care Gaps: ?Last eye exam / Retinopathy Screening? Never done  ?Last Annual Wellness Visit?  ?Last Diabetic Foot Exam? 12/24/21 ? ? ?Star Rating Drugs:  ?Medication:  Last Fill: Day Supply ?Farxiga (PAP) ?Ozempic (PAP) ? ? ?[name], CMA ?Clinical Pharmacist Assistant  ?[phone]  ?

## 2022-02-11 ENCOUNTER — Other Ambulatory Visit: Payer: Self-pay | Admitting: Family Medicine

## 2022-02-17 ENCOUNTER — Other Ambulatory Visit: Payer: Self-pay

## 2022-02-17 MED ORDER — APIXABAN 5 MG PO TABS: 5.0000 mg | ORAL_TABLET | Freq: Two times a day (BID) | ORAL | 1 refills | Status: DC

## 2022-02-27 DIAGNOSIS — I4891 Unspecified atrial fibrillation: Secondary | ICD-10-CM

## 2022-02-27 DIAGNOSIS — E785 Hyperlipidemia, unspecified: Secondary | ICD-10-CM | POA: Diagnosis not present

## 2022-02-27 DIAGNOSIS — E1159 Type 2 diabetes mellitus with other circulatory complications: Secondary | ICD-10-CM | POA: Diagnosis not present

## 2022-02-27 DIAGNOSIS — Z794 Long term (current) use of insulin: Secondary | ICD-10-CM | POA: Diagnosis not present

## 2022-02-27 DIAGNOSIS — J449 Chronic obstructive pulmonary disease, unspecified: Secondary | ICD-10-CM | POA: Diagnosis not present

## 2022-02-27 DIAGNOSIS — I5030 Unspecified diastolic (congestive) heart failure: Secondary | ICD-10-CM | POA: Diagnosis not present

## 2022-03-10 ENCOUNTER — Other Ambulatory Visit: Payer: Self-pay | Admitting: Physician Assistant

## 2022-03-10 ENCOUNTER — Other Ambulatory Visit: Payer: Self-pay | Admitting: Cardiology

## 2022-03-10 DIAGNOSIS — M533 Sacrococcygeal disorders, not elsewhere classified: Secondary | ICD-10-CM | POA: Diagnosis not present

## 2022-03-13 ENCOUNTER — Ambulatory Visit: Payer: Medicare HMO | Admitting: Cardiology

## 2022-03-13 ENCOUNTER — Encounter: Payer: Self-pay | Admitting: Cardiology

## 2022-03-13 VITALS — BP 126/78 | HR 72 | Ht 71.0 in | Wt 323.6 lb

## 2022-03-13 DIAGNOSIS — N183 Chronic kidney disease, stage 3 unspecified: Secondary | ICD-10-CM

## 2022-03-13 DIAGNOSIS — I350 Nonrheumatic aortic (valve) stenosis: Secondary | ICD-10-CM

## 2022-03-13 DIAGNOSIS — E782 Mixed hyperlipidemia: Secondary | ICD-10-CM

## 2022-03-13 DIAGNOSIS — I4510 Unspecified right bundle-branch block: Secondary | ICD-10-CM | POA: Diagnosis not present

## 2022-03-13 DIAGNOSIS — Z79899 Other long term (current) drug therapy: Secondary | ICD-10-CM

## 2022-03-13 DIAGNOSIS — I130 Hypertensive heart and chronic kidney disease with heart failure and stage 1 through stage 4 chronic kidney disease, or unspecified chronic kidney disease: Secondary | ICD-10-CM

## 2022-03-13 DIAGNOSIS — I5032 Chronic diastolic (congestive) heart failure: Secondary | ICD-10-CM | POA: Diagnosis not present

## 2022-03-13 DIAGNOSIS — I480 Paroxysmal atrial fibrillation: Secondary | ICD-10-CM | POA: Diagnosis not present

## 2022-03-13 DIAGNOSIS — Z7901 Long term (current) use of anticoagulants: Secondary | ICD-10-CM

## 2022-03-13 NOTE — Patient Instructions (Signed)
Medication Instructions:  ?Your physician recommends that you continue on your current medications as directed. Please refer to the Current Medication list given to you today. ? ?*If you need a refill on your cardiac medications before your next appointment, please call your pharmacy* ? ? ?Lab Work: ?NONE ?If you have labs (blood work) drawn today and your tests are completely normal, you will receive your results only by: ?MyChart Message (if you have MyChart) OR ?A paper copy in the mail ?If you have any lab test that is abnormal or we need to change your treatment, we will call you to review the results. ? ? ?Testing/Procedures: ?Your physician has requested that you have an echocardiogram. Echocardiography is a painless test that uses sound waves to create images of your heart. It provides your doctor with information about the size and shape of your heart and how well your heart?s chambers and valves are working. This procedure takes approximately one hour. There are no restrictions for this procedure.  ? ? ?Follow-Up: ?At [facility], you and your health needs are our priority.  As part of our continuing mission to provide you with exceptional heart care, we have created designated Provider Care Teams.  These Care Teams include your primary Cardiologist (physician) and Advanced Practice Providers (APPs -  Physician Assistants and Nurse Practitioners) who all work together to provide you with the care you need, when you need it. ? ?We recommend signing up for the patient portal called "MyChart".  Sign up information is provided on this After Visit Summary.  MyChart is used to connect with patients for Virtual Visits (Telemedicine).  Patients are able to view lab/test results, encounter notes, upcoming appointments, etc.  Non-urgent messages can be sent to your provider as well.   ?To learn more about what you can do with MyChart, go to [email or web address].   ? ?Your next appointment:   ?6 month(s) ? ?The  format for your next appointment:   ?In Person ? ?Provider:   ?Shirlee More, MD  ? ? ?Other Instructions ? ? ?Important Information About Sugar ? ? ? ? ?  ?

## 2022-03-13 NOTE — Progress Notes (Signed)
?Cardiology Office Note:   ? ?Date:  03/13/2022  ? ?ID:  Shawn Sullivan, DOB 25-Feb-1946, MRN [id number] ? ?PCP:  Rochel Brome, MD  ?Cardiologist:  Shirlee More, MD   ? ?Referring MD: Rochel Brome, MD please check thyroid test and CMP every 6 months with your labs in the office with amiodarone treatment ? ? ?ASSESSMENT:   ? ?1. PAF (paroxysmal atrial fibrillation) ([place])   ?2. On amiodarone therapy   ?3. Chronic anticoagulation   ?4. Right bundle branch block   ?5. Hypertensive heart and kidney disease with chronic diastolic congestive heart failure and stage 3 chronic kidney disease, unspecified whether stage 3a or 3b CKD ([place])   ?6. Nonrheumatic aortic valve stenosis   ?7. Mixed hyperlipidemia   ? ?PLAN:   ? ?In order of problems listed above: ? ?He continues to maintain sinus rhythm on low-dose amiodarone continue the same along with his anticoagulant ?Stable EKG pattern ?He remains fluid overloaded and I told him he may need a need to increase his Zaroxolyn to every 5 days we will continue to self monitor continue his high dose of torsemide with.  Blood pressure is at target on multidrug regimen including his loop diuretic beta-blocker ?Stable CKD ?He has mild aortic stenosis we will recheck an echocardiogram in 2-year normal ?Continue his statin ? ? ?Next appointment: 1 year ? ? ?Medication Adjustments/Labs and Tests Ordered: ?Current medicines are reviewed at length with the patient today.  Concerns regarding medicines are outlined above.  ?No orders of the defined types were placed in this encounter. ? ?No orders of the defined types were placed in this encounter. ? ? ?Chief Complaint  ?Patient presents with  ? Follow-up  ? Atrial Fibrillation  ?  He is on amiodarone ?  ? Congestive Heart Failure  ? ? ?History of Present Illness:   ? ?Shawn Sullivan is a 76 y.o. male with a hx of paroxysmal atrial fibrillation maintaining sinus rhythm on low-dose amiodarone anticoagulated, hypertensive heart and chronic  kidney disease with diastolic heart failure and stage III CKD mild aortic stenosis and right bundle branch block.  He was last seen 07/02/2021.  He also has mild aortic stenosis on his last echocardiogram2021. ?Compliance with diet, lifestyle and medications: Yes ? ?He continues to have edema and takes Zaroxolyn just about every week ?Stable shortness of breath more than usual activities outside the home ?No orthopnea PND chest pain palpitation or syncope ?No findings of amiodarone toxicity ?He tolerates his anticoagulant without bleeding ?He tolerates his statin without muscle pain or weakness ?Past Medical History:  ?Diagnosis Date  ? Anticoagulated 07/01/2018  ? Anticoagulated 07/01/2018  ? Benign essential hypertension 05/06/2013  ? Chronic obstructive pulmonary disease ([place]) 11/05/2015  ? COPD (chronic obstructive pulmonary disease) ([place]) 07/01/2018  ? Dermatochalasis 12/16/2015  ? Disorder of bursae and tendons in shoulder region 01/05/2014  ? Generalized edema 06/22/2016  ? Hyperlipidemia 12/16/2015  ? Hypertensive heart disease 07/01/2018  ? Low back pain 08/17/2013  ? Lumbosacral spondylosis 05/06/2013  ? Obstructive sleep apnea 07/01/2018  ? Persistent atrial fibrillation ([place]) 07/01/2018  ? Piriformis syndrome 05/03/2014  ? Postlaminectomy syndrome, lumbar region 05/06/2013  ? Presbyopia of both eyes 12/16/2015  ? Restless legs syndrome 03/23/2016  ? Restrictive lung disease 06/22/2016  ? Sleep apnea 05/06/2013  ? Type 2 diabetes mellitus without complications ([place]) 00/05/6225  ? ? ?Past Surgical History:  ?Procedure Laterality Date  ? APPENDECTOMY    ? BACK SURGERY    ?  CARDIOVERSION N/A 12/01/2018  ? Procedure: CARDIOVERSION;  Surgeon: Sanda Klein, MD;  Location: [facility];  Service: Cardiovascular;  Laterality: N/A;  ? CATARACT EXTRACTION Bilateral   ? LUMBAR FUSION    ? NECK SURGERY    ? ? ?Current Medications: ?Current Meds  ?Medication Sig  ? acetaminophen (TYLENOL) 500 MG tablet Take 1,000 mg by mouth every 6 (six) hours as  needed for moderate pain or headache.  ? amiodarone (PACERONE) 200 MG tablet Take 1 tablet by mouth in the evening  ? apixaban (ELIQUIS) 5 MG TABS tablet Take 1 tablet (5 mg total) by mouth 2 (two) times daily.  ? BD PEN NEEDLE MICRO U/F 32G X 6 MM MISC SMARTSIG:1 Needle SUB-Q Daily  ? Budeson-Glycopyrrol-Formoterol (BREZTRI AEROSPHERE) 160-9-4.8 MCG/ACT AERO Inhale 2 puffs into the lungs 2 (two) times daily.  ? cholecalciferol (VITAMIN D3) 25 MCG (1000 UT) tablet Take 1,000 Units by mouth daily.   ? dapagliflozin propanediol (FARXIGA) 10 MG TABS tablet Take 1 tablet (10 mg total) by mouth daily before breakfast.  ? folic acid (FOLVITE) 601 MCG tablet Take 800 mcg by mouth daily.  ? HYDROcodone-acetaminophen (NORCO) 10-325 MG tablet Take 1 tablet by mouth every 4 (four) hours as needed for moderate pain.  ? insulin degludec (TRESIBA FLEXTOUCH) 100 UNIT/ML FlexTouch Pen Inject 55 Units into the skin daily.  ? ipratropium-albuterol (DUONEB) 0.5-2.5 (3) MG/3ML SOLN Take 3 mLs by nebulization every 6 (six) hours as needed (shortness of breath).  ? levocetirizine (XYZAL) 5 MG tablet Take 5 mg by mouth daily as needed for allergies.  ? metolazone (ZAROXOLYN) 5 MG tablet Take 5 mg by mouth once a week. Uses prn if swollen once weekly.  ? metoprolol tartrate (LOPRESSOR) 50 MG tablet TAKE 1 TABLET BY MOUTH IN THE MORNING AND 1 AT NOON AND 1 AT BEDTIME  ? montelukast (SINGULAIR) 10 MG tablet TAKE 1 TABLET BY MOUTH ONCE DAILY IN THE EVENING  ? Multiple Vitamin (MULTIVITAMIN) tablet Take 1 tablet by mouth daily.  ? Omeprazole 20 MG TBEC Take 20 mg by mouth 2 (two) times daily before a meal.   ? Potassium Chloride ER 20 MEQ TBCR Take 1 tablet by mouth 2 (two) times daily.  ? rOPINIRole (REQUIP) 0.5 MG tablet TAKE 1 TABLET BY MOUTH AT BEDTIME  ? rosuvastatin (CRESTOR) 40 MG tablet Take 1 tablet (40 mg total) by mouth daily.  ? Semaglutide (OZEMPIC, 1 MG/DOSE, [place]) Inject 1 mg into the skin 2 (two) times a week.  ? sertraline  (ZOLOFT) 50 MG tablet Take 1 tablet by mouth twice daily  ? tamsulosin (FLOMAX) 0.4 MG CAPS capsule Take 1 capsule by mouth at bedtime  ? torsemide (DEMADEX) 20 MG tablet Take 40 mg by mouth 2 (two) times daily.  ? VENTOLIN HFA 108 (90 Base) MCG/ACT inhaler INHALE 2 PUFFS BY MOUTH EVERY 4 HOURS AS NEEDED FOR WHEEZING FOR SHORTNESS OF BREATH  ?  ? ?Allergies:   Androgel [testosterone], Biaxin [clarithromycin], Duloxetine, Gabapentin, Ibuprofen, Lyrica [pregabalin], and Spironolactone  ? ?Social History  ? ?Socioeconomic History  ? Marital status: Married  ?  Spouse name: Not on file  ? Number of children: Not on file  ? Years of education: Not on file  ? Highest education level: Not on file  ?Occupational History  ? Not on file  ?Tobacco Use  ? Smoking status: Former  ?  Types: Cigarettes  ?  Quit date: 23  ?  Years since quitting: 30.3  ? Smokeless  tobacco: Never  ?Vaping Use  ? Vaping Use: Never used  ?Substance and Sexual Activity  ? Alcohol use: Not Currently  ? Drug use: Not Currently  ?  Types: Hydrocodone  ? Sexual activity: Not on file  ?Other Topics Concern  ? Not on file  ?Social History Narrative  ? Not on file  ? ?Social Determinants of Health  ? ?Financial Resource Strain: High Risk  ? Difficulty of Paying Living Expenses: Very hard  ?Food Insecurity: Not on file  ?Transportation Needs: No Transportation Needs  ? Lack of Transportation (Medical): No  ? Lack of Transportation (Non-Medical): No  ?Physical Activity: Not on file  ?Stress: Not on file  ?Social Connections: Not on file  ?  ? ?Family History: ?The patient's family history includes Atrial fibrillation in his mother; Cancer in his mother; Heart disease in his father. ?ROS:   ?Please see the history of present illness.    ?All other systems reviewed and are negative. ? ?EKGs/Labs/Other Studies Reviewed:   ? ?The following studies were reviewed today: ? ?Echocardiogram performed 09/11/2020 shows normal ejection fraction 60 to 65% moderate  concentric LVH elevated left ventricular filling pressures and mild aortic stenosis with aortic valve calcification peak and mean gradients of 30 and 15 mmHg  ? ?EKG:  EKG ordered today and personally reviewed.  Terie Purser

## 2022-03-25 ENCOUNTER — Other Ambulatory Visit: Payer: Self-pay | Admitting: Physician Assistant

## 2022-04-02 ENCOUNTER — Other Ambulatory Visit: Payer: Self-pay

## 2022-04-02 ENCOUNTER — Ambulatory Visit (INDEPENDENT_AMBULATORY_CARE_PROVIDER_SITE_OTHER): Payer: Medicare HMO | Admitting: Family Medicine

## 2022-04-02 ENCOUNTER — Telehealth: Payer: Self-pay

## 2022-04-02 VITALS — BP 120/64 | HR 78 | Temp 97.6°F | Resp 18 | Ht 71.0 in | Wt 317.0 lb

## 2022-04-02 DIAGNOSIS — Z6841 Body Mass Index (BMI) 40.0 and over, adult: Secondary | ICD-10-CM

## 2022-04-02 DIAGNOSIS — Q846 Other congenital malformations of nails: Secondary | ICD-10-CM

## 2022-04-02 DIAGNOSIS — Z794 Long term (current) use of insulin: Secondary | ICD-10-CM | POA: Diagnosis not present

## 2022-04-02 DIAGNOSIS — I482 Chronic atrial fibrillation, unspecified: Secondary | ICD-10-CM | POA: Diagnosis not present

## 2022-04-02 DIAGNOSIS — K219 Gastro-esophageal reflux disease without esophagitis: Secondary | ICD-10-CM

## 2022-04-02 DIAGNOSIS — E785 Hyperlipidemia, unspecified: Secondary | ICD-10-CM | POA: Diagnosis not present

## 2022-04-02 DIAGNOSIS — D6869 Other thrombophilia: Secondary | ICD-10-CM

## 2022-04-02 DIAGNOSIS — G894 Chronic pain syndrome: Secondary | ICD-10-CM

## 2022-04-02 DIAGNOSIS — I5041 Acute combined systolic (congestive) and diastolic (congestive) heart failure: Secondary | ICD-10-CM | POA: Diagnosis not present

## 2022-04-02 DIAGNOSIS — F1120 Opioid dependence, uncomplicated: Secondary | ICD-10-CM

## 2022-04-02 DIAGNOSIS — I130 Hypertensive heart and chronic kidney disease with heart failure and stage 1 through stage 4 chronic kidney disease, or unspecified chronic kidney disease: Secondary | ICD-10-CM | POA: Diagnosis not present

## 2022-04-02 DIAGNOSIS — J410 Simple chronic bronchitis: Secondary | ICD-10-CM

## 2022-04-02 DIAGNOSIS — N1832 Chronic kidney disease, stage 3b: Secondary | ICD-10-CM

## 2022-04-02 DIAGNOSIS — E66813 Obesity, class 3: Secondary | ICD-10-CM

## 2022-04-02 DIAGNOSIS — E0842 Diabetes mellitus due to underlying condition with diabetic polyneuropathy: Secondary | ICD-10-CM | POA: Diagnosis not present

## 2022-04-02 DIAGNOSIS — I4819 Other persistent atrial fibrillation: Secondary | ICD-10-CM

## 2022-04-02 DIAGNOSIS — G4733 Obstructive sleep apnea (adult) (pediatric): Secondary | ICD-10-CM

## 2022-04-02 MED ORDER — BREZTRI AEROSPHERE 160-9-4.8 MCG/ACT IN AERO: 2.0000 | INHALATION_SPRAY | Freq: Two times a day (BID) | RESPIRATORY_TRACT | 3 refills | Status: DC

## 2022-04-02 NOTE — Progress Notes (Addendum)
? ?Subjective:  ?Patient ID: Shawn Sullivan, male    DOB: 11/26/1946  Age: 76 y.o. MRN: [id number] ? ?Chief Complaint  ?Patient presents with  ? Diabetes  ? Hyperlipidemia  ? Hypertension  ? ?HPI: ?Diabetes:  ?Complications: neuropathy.  ?Glucose checking: every 2-3 days ?Glucose logs: 85-140.  ?Hypoglycemia: no ?Most recent A1C: 7.5 ?Current medications: farxiga 10 mg daily, tresiba 55 U daily. Patient has not been taking ozempic 2 mg weekly due to not receiving yet due to clerical error. Getting redone.  ?Last Eye Exam: 09/2021 ?Foot checks:continues to check daily. Has a toenail that is black on left foot.  ? ?Hyperlipidemia: ?Current medications: crestor 40 mg qd ? ?Hypertension: ?Complications: CKD. Kidney function stable.  ?Current medications: on metoprolol tartrate 50 mg three times a day, demedex 20 mg 2 po twice daily, metolazone 5 mg once a wk as needed swelling. and potassium 20 meq twice daily.  Medications: checked and unchanged. Swelling has improved.  ? ?Atrial fibrillation: on amiodarone 200 mg daily, metoprolol 50 mg three times a day.  and eliquis 5 mg twice daily. Dr. Bettina Gavia gave good report.  ?RLS: on requip 0.5 mg once daily as needed.  ?Chronic pain syndrome: Sees pain clinic. ON hydrocodone/APAP  10/325 mg four times a day as needed  ?Mild recurrent depression: on zoloft 50 mg twice daily  ?GERD: on omeprazole 20 mg 2 oral twice daily  ?COPD: Patient has been out of breztri for a long time. Using duoneb treatments/albuterol hfa 4-5 times per day. If on breztri he does not need rescue inhalers. Also on singulair and xyzal.  ?Diet: not healthy. Exercise: walking some.  ? ?  ?Current Outpatient Medications on File Prior to Visit  ?Medication Sig Dispense Refill  ? acetaminophen (TYLENOL) 500 MG tablet Take 1,000 mg by mouth every 6 (six) hours as needed for moderate pain or headache.    ? amiodarone (PACERONE) 200 MG tablet Take 1 tablet by mouth in the evening 90 tablet 0  ? apixaban  (ELIQUIS) 5 MG TABS tablet Take 1 tablet (5 mg total) by mouth 2 (two) times daily. 180 tablet 1  ? BD PEN NEEDLE MICRO U/F 32G X 6 MM MISC SMARTSIG:1 Needle SUB-Q Daily    ? cholecalciferol (VITAMIN D3) 25 MCG (1000 UT) tablet Take 1,000 Units by mouth daily.     ? dapagliflozin propanediol (FARXIGA) 10 MG TABS tablet Take 1 tablet (10 mg total) by mouth daily before breakfast. 90 tablet 3  ? folic acid (FOLVITE) 637 MCG tablet Take 800 mcg by mouth daily.    ? HYDROcodone-acetaminophen (NORCO) 10-325 MG tablet Take 1 tablet by mouth every 4 (four) hours as needed for moderate pain.    ? insulin degludec (TRESIBA FLEXTOUCH) 100 UNIT/ML FlexTouch Pen Inject 55 Units into the skin daily.    ? ipratropium-albuterol (DUONEB) 0.5-2.5 (3) MG/3ML SOLN Take 3 mLs by nebulization every 6 (six) hours as needed (shortness of breath). 360 mL 0  ? levocetirizine (XYZAL) 5 MG tablet Take 5 mg by mouth daily as needed for allergies.    ? metolazone (ZAROXOLYN) 5 MG tablet Take 5 mg by mouth once a week. Uses prn if swollen once weekly.    ? metoprolol tartrate (LOPRESSOR) 50 MG tablet TAKE 1 TABLET BY MOUTH IN THE MORNING AND 1 AT NOON AND 1 AT BEDTIME 270 tablet 0  ? montelukast (SINGULAIR) 10 MG tablet TAKE 1 TABLET BY MOUTH ONCE DAILY IN THE EVENING 90 tablet 0  ?  Multiple Vitamin (MULTIVITAMIN) tablet Take 1 tablet by mouth daily.    ? Omeprazole 20 MG TBEC Take 20 mg by mouth 2 (two) times daily before a meal.     ? Potassium Chloride ER 20 MEQ TBCR Take 1 tablet by mouth 2 (two) times daily.    ? rOPINIRole (REQUIP) 0.5 MG tablet TAKE 1 TABLET BY MOUTH AT BEDTIME (Patient taking differently: at bedtime as needed.) 30 tablet 2  ? rosuvastatin (CRESTOR) 40 MG tablet Take 1 tablet (40 mg total) by mouth daily. 90 tablet 1  ? sertraline (ZOLOFT) 50 MG tablet Take 1 tablet by mouth twice daily 180 tablet 0  ? tamsulosin (FLOMAX) 0.4 MG CAPS capsule Take 1 capsule by mouth at bedtime 90 capsule 0  ? torsemide (DEMADEX) 20 MG  tablet Take 40 mg by mouth 2 (two) times daily.    ? VENTOLIN HFA 108 (90 Base) MCG/ACT inhaler INHALE 2 PUFFS BY MOUTH EVERY 4 HOURS AS NEEDED FOR WHEEZING FOR SHORTNESS OF BREATH 18 g 3  ? Semaglutide (OZEMPIC, 1 MG/DOSE, [place]) Inject 1 mg into the skin 2 (two) times a week. (Patient not taking: Reported on 04/02/2022)    ? ?No current facility-administered medications on file prior to visit.  ? ?Past Medical History:  ?Diagnosis Date  ? Anticoagulated 07/01/2018  ? Anticoagulated 07/01/2018  ? Benign essential hypertension 05/06/2013  ? Chronic obstructive pulmonary disease ([place]) 11/05/2015  ? COPD (chronic obstructive pulmonary disease) ([place]) 07/01/2018  ? Dermatochalasis 12/16/2015  ? Disorder of bursae and tendons in shoulder region 01/05/2014  ? Generalized edema 06/22/2016  ? Hyperlipidemia 12/16/2015  ? Hypertensive heart disease 07/01/2018  ? Low back pain 08/17/2013  ? Lumbosacral spondylosis 05/06/2013  ? Obstructive sleep apnea 07/01/2018  ? Persistent atrial fibrillation ([place]) 07/01/2018  ? Piriformis syndrome 05/03/2014  ? Postlaminectomy syndrome, lumbar region 05/06/2013  ? Presbyopia of both eyes 12/16/2015  ? Restless legs syndrome 03/23/2016  ? Restrictive lung disease 06/22/2016  ? Sleep apnea 05/06/2013  ? Type 2 diabetes mellitus without complications ([place]) 51/0/2585  ? ?Past Surgical History:  ?Procedure Laterality Date  ? APPENDECTOMY    ? BACK SURGERY    ? CARDIOVERSION N/A 12/01/2018  ? Procedure: CARDIOVERSION;  Surgeon: Sanda Klein, MD;  Location: [facility];  Service: Cardiovascular;  Laterality: N/A;  ? CATARACT EXTRACTION Bilateral   ? LUMBAR FUSION    ? NECK SURGERY    ?  ?Family History  ?Problem Relation Age of Onset  ? Atrial fibrillation Mother   ? Cancer Mother   ? Heart disease Father   ? ?Social History  ? ?Socioeconomic History  ? Marital status: Married  ?  Spouse name: Not on file  ? Number of children: Not on file  ? Years of education: Not on file  ? Highest education level: Not on file  ?Occupational  History  ? Not on file  ?Tobacco Use  ? Smoking status: Former  ?  Types: Cigarettes  ?  Quit date: 2  ?  Years since quitting: 30.3  ? Smokeless tobacco: Never  ?Vaping Use  ? Vaping Use: Never used  ?Substance and Sexual Activity  ? Alcohol use: Not Currently  ? Drug use: Not Currently  ?  Types: Hydrocodone  ? Sexual activity: Not on file  ?Other Topics Concern  ? Not on file  ?Social History Narrative  ? Not on file  ? ?Social Determinants of Health  ? ?Financial Resource Strain: High Risk  ? Difficulty  of Paying Living Expenses: Very hard  ?Food Insecurity: Not on file  ?Transportation Needs: No Transportation Needs  ? Lack of Transportation (Medical): No  ? Lack of Transportation (Non-Medical): No  ?Physical Activity: Not on file  ?Stress: Not on file  ?Social Connections: Not on file  ? ? ?Review of Systems  ?Constitutional:  Positive for fatigue. Negative for appetite change and fever.  ?HENT:  Negative for congestion, ear pain, sinus pressure and sore throat.   ?Respiratory:  Positive for shortness of breath. Negative for cough, chest tightness and wheezing.   ?Cardiovascular:  Negative for chest pain and palpitations.  ?Gastrointestinal:  Negative for abdominal pain, constipation, diarrhea, nausea and vomiting.  ?     Abdominal tenderness   ?Genitourinary:  Negative for dysuria and hematuria.  ?Musculoskeletal:  Negative for arthralgias, back pain, joint swelling and myalgias.  ?Skin:  Negative for rash.  ?Neurological:  Positive for weakness. Negative for dizziness and headaches.  ?Psychiatric/Behavioral:  Negative for dysphoric mood. The patient is not nervous/anxious.   ? ? ?Objective:  ?BP 120/64   Pulse 78   Temp 97.6 ?F (36.4 ?C)   Resp 18   Ht '5\' 11"'$  (1.803 m)   Wt (!) 317 lb (143.8 kg)   BMI 44.21 kg/m?  ? ? ?  04/02/2022  ? 11:08 AM 03/13/2022  ?  3:10 PM 12/24/2021  ? 11:36 AM  ?BP/Weight  ?Systolic BP 347 425 956  ?Diastolic BP 64 78 70  ?Wt. (Lbs) 317 323.6 321  ?BMI 44.21 kg/m2 45.13  kg/m2 44.77 kg/m2  ? ? ?Physical Exam ?Vitals reviewed.  ?Constitutional:   ?   Appearance: Normal appearance. He is normal weight.  ?Neck:  ?   Vascular: No carotid bruit.  ?Cardiovascular:  ?   Rate and Rhythm: Normal ra

## 2022-04-02 NOTE — Chronic Care Management (AMB) (Addendum)
? ? ?Chronic Care Management ?Pharmacy Assistant  ? ?Name: Shawn Sullivan  MRN: [id number] DOB: May 07, 1946 ? ?Reason for Encounter: Patient Assistance Coordination ? ?Receved fax from [employer] patient assistance program, patients Shawn Sullivan and Shawn Sullivan needles will be filled 04/14/2022 and arrive to the office in 10-14 business days.  ? ?Spoke with Shawn Hammock, LPN, patient called regarding Ozempic patient assistance medication and Shawn Sullivan, he is out. Checked patient assistance forms, we only did application with [employer] for [place], sending reorder form for [employer]. Sending request to clinical team for a refill prescription to be sent to [facility] for Shawn Sullivan patient assistance program Breztri refill. ? ?Medications: ?Outpatient Encounter Medications as of 04/02/2022  ?Medication Sig  ? acetaminophen (TYLENOL) 500 MG tablet Take 1,000 mg by mouth every 6 (six) hours as needed for moderate pain or headache.  ? amiodarone (PACERONE) 200 MG tablet Take 1 tablet by mouth in the evening  ? apixaban (ELIQUIS) 5 MG TABS tablet Take 1 tablet (5 mg total) by mouth 2 (two) times daily.  ? BD PEN NEEDLE MICRO U/F 32G X 6 MM MISC SMARTSIG:1 Needle SUB-Q Daily  ? Budeson-Glycopyrrol-Formoterol (BREZTRI AEROSPHERE) 160-9-4.8 MCG/ACT AERO Inhale 2 puffs into the lungs 2 (two) times daily.  ? cholecalciferol (VITAMIN D3) 25 MCG (1000 UT) tablet Take 1,000 Units by mouth daily.   ? dapagliflozin propanediol (FARXIGA) 10 MG TABS tablet Take 1 tablet (10 mg total) by mouth daily before breakfast.  ? folic acid (FOLVITE) 542 MCG tablet Take 800 mcg by mouth daily.  ? HYDROcodone-acetaminophen (NORCO) 10-325 MG tablet Take 1 tablet by mouth every 4 (four) hours as needed for moderate pain.  ? insulin degludec (TRESIBA FLEXTOUCH) 100 UNIT/ML FlexTouch Pen Inject 55 Units into the skin daily.  ? ipratropium-albuterol (DUONEB) 0.5-2.5 (3) MG/3ML SOLN Take 3 mLs by nebulization every 6 (six) hours as needed (shortness of  breath).  ? levocetirizine (XYZAL) 5 MG tablet Take 5 mg by mouth daily as needed for allergies.  ? metolazone (ZAROXOLYN) 5 MG tablet Take 5 mg by mouth once a week. Uses prn if swollen once weekly.  ? metoprolol tartrate (LOPRESSOR) 50 MG tablet TAKE 1 TABLET BY MOUTH IN THE MORNING AND 1 AT NOON AND 1 AT BEDTIME  ? montelukast (SINGULAIR) 10 MG tablet TAKE 1 TABLET BY MOUTH ONCE DAILY IN THE EVENING  ? Multiple Vitamin (MULTIVITAMIN) tablet Take 1 tablet by mouth daily.  ? Omeprazole 20 MG TBEC Take 20 mg by mouth 2 (two) times daily before a meal.   ? Potassium Chloride ER 20 MEQ TBCR Take 1 tablet by mouth 2 (two) times daily.  ? rOPINIRole (REQUIP) 0.5 MG tablet TAKE 1 TABLET BY MOUTH AT BEDTIME (Patient taking differently: at bedtime as needed.)  ? rosuvastatin (CRESTOR) 40 MG tablet Take 1 tablet (40 mg total) by mouth daily.  ? Semaglutide (OZEMPIC, 1 MG/DOSE, [place]) Inject 1 mg into the skin 2 (two) times a week. (Patient not taking: Reported on 04/02/2022)  ? sertraline (ZOLOFT) 50 MG tablet Take 1 tablet by mouth twice daily  ? tamsulosin (FLOMAX) 0.4 MG CAPS capsule Take 1 capsule by mouth at bedtime  ? torsemide (DEMADEX) 20 MG tablet Take 40 mg by mouth 2 (two) times daily.  ? VENTOLIN HFA 108 (90 Base) MCG/ACT inhaler INHALE 2 PUFFS BY MOUTH EVERY 4 HOURS AS NEEDED FOR WHEEZING FOR SHORTNESS OF BREATH  ? ?No facility-administered encounter medications on file as of 04/02/2022.  ? ? ?  Shawn Sullivan, CMA ?Clinical Pharmacist Assistant ?[phone] ? ?

## 2022-04-03 LAB — TSH: TSH: 2.38 u[IU]/mL (ref 0.450–4.500)

## 2022-04-03 LAB — COMPREHENSIVE METABOLIC PANEL
ALT: 21 IU/L (ref 0–44)
AST: 25 IU/L (ref 0–40)
Albumin/Globulin Ratio: 1.7 (ref 1.2–2.2)
Albumin: 3.8 g/dL (ref 3.7–4.7)
Alkaline Phosphatase: 102 IU/L (ref 44–121)
BUN/Creatinine Ratio: 16 (ref 10–24)
BUN: 24 mg/dL (ref 8–27)
Bilirubin Total: 0.8 mg/dL (ref 0.0–1.2)
CO2: 28 mmol/L (ref 20–29)
Calcium: 9.8 mg/dL (ref 8.6–10.2)
Chloride: 99 mmol/L (ref 96–106)
Creatinine, Ser: 1.54 mg/dL — ABNORMAL HIGH (ref 0.76–1.27)
Globulin, Total: 2.3 g/dL (ref 1.5–4.5)
Glucose: 116 mg/dL — ABNORMAL HIGH (ref 70–99)
Potassium: 3.9 mmol/L (ref 3.5–5.2)
Sodium: 140 mmol/L (ref 134–144)
Total Protein: 6.1 g/dL (ref 6.0–8.5)
eGFR: 46 mL/min/{1.73_m2} — ABNORMAL LOW (ref >59)

## 2022-04-03 LAB — LIPID PANEL
Chol/HDL Ratio: 3.5 ratio (ref 0.0–5.0)
Cholesterol, Total: 134 mg/dL (ref 100–199)
HDL: 38 mg/dL — ABNORMAL LOW (ref >39)
LDL Chol Calc (NIH): 74 mg/dL (ref 0–99)
Triglycerides: 120 mg/dL (ref 0–149)
VLDL Cholesterol Cal: 22 mg/dL (ref 5–40)

## 2022-04-03 LAB — CBC WITH DIFF/PLATELET
Basophils Absolute: 0.1 10*3/uL (ref 0.0–0.2)
Basos: 1 %
EOS (ABSOLUTE): 0.3 10*3/uL (ref 0.0–0.4)
Eos: 3 %
Hematocrit: 39.5 % (ref 37.5–51.0)
Hemoglobin: 12.4 g/dL — ABNORMAL LOW (ref 13.0–17.7)
Immature Grans (Abs): 0 10*3/uL (ref 0.0–0.1)
Immature Granulocytes: 0 %
Lymphocytes Absolute: 2.6 10*3/uL (ref 0.7–3.1)
Lymphs: 27 %
MCH: 26.2 pg — ABNORMAL LOW (ref 26.6–33.0)
MCHC: 31.4 g/dL — ABNORMAL LOW (ref 31.5–35.7)
MCV: 83 fL (ref 79–97)
Monocytes Absolute: 0.7 10*3/uL (ref 0.1–0.9)
Monocytes: 7 %
Neutrophils Absolute: 6.1 10*3/uL (ref 1.4–7.0)
Neutrophils: 62 %
Platelets: 164 10*3/uL (ref 150–450)
RBC: 4.74 x10E6/uL (ref 4.14–5.80)
RDW: 14.9 % (ref 11.6–15.4)
WBC: 9.7 10*3/uL (ref 3.4–10.8)

## 2022-04-03 LAB — HEMOGLOBIN A1C
Est. average glucose Bld gHb Est-mCnc: 174 mg/dL
Hgb A1c MFr Bld: 7.7 % — ABNORMAL HIGH (ref 4.8–5.6)

## 2022-04-08 ENCOUNTER — Telehealth: Payer: Self-pay

## 2022-04-08 NOTE — Progress Notes (Signed)
? ? ?Chronic Care Management ?Pharmacy Assistant  ? ?Name: Shawn Sullivan  MRN: [id number] DOB: 1946/05/01 ? ? ?Reason for Encounter: Disease State call for DM  ?  ?Recent office visits:  ?04/02/22 Rochel Brome MD. Seen for routine visit. No med changes. ? ?Recent consult visits:  ?03/13/22 (Cardiology) Shirlee More MD. Seen for Paroxysmal Atrial Fibrillation. No med changes. ? ?Hospital visits:  ?None ? ?Medications: ?Outpatient Encounter Medications as of 04/08/2022  ?Medication Sig  ? acetaminophen (TYLENOL) 500 MG tablet Take 1,000 mg by mouth every 6 (six) hours as needed for moderate pain or headache.  ? amiodarone (PACERONE) 200 MG tablet Take 1 tablet by mouth in the evening  ? apixaban (ELIQUIS) 5 MG TABS tablet Take 1 tablet (5 mg total) by mouth 2 (two) times daily.  ? BD PEN NEEDLE MICRO U/F 32G X 6 MM MISC SMARTSIG:1 Needle SUB-Q Daily  ? Budeson-Glycopyrrol-Formoterol (BREZTRI AEROSPHERE) 160-9-4.8 MCG/ACT AERO Inhale 2 puffs into the lungs 2 (two) times daily.  ? cholecalciferol (VITAMIN D3) 25 MCG (1000 UT) tablet Take 1,000 Units by mouth daily.   ? dapagliflozin propanediol (FARXIGA) 10 MG TABS tablet Take 1 tablet (10 mg total) by mouth daily before breakfast.  ? folic acid (FOLVITE) 676 MCG tablet Take 800 mcg by mouth daily.  ? HYDROcodone-acetaminophen (NORCO) 10-325 MG tablet Take 1 tablet by mouth every 4 (four) hours as needed for moderate pain.  ? insulin degludec (TRESIBA FLEXTOUCH) 100 UNIT/ML FlexTouch Pen Inject 55 Units into the skin daily.  ? ipratropium-albuterol (DUONEB) 0.5-2.5 (3) MG/3ML SOLN Take 3 mLs by nebulization every 6 (six) hours as needed (shortness of breath).  ? levocetirizine (XYZAL) 5 MG tablet Take 5 mg by mouth daily as needed for allergies.  ? metolazone (ZAROXOLYN) 5 MG tablet Take 5 mg by mouth once a week. Uses prn if swollen once weekly.  ? metoprolol tartrate (LOPRESSOR) 50 MG tablet TAKE 1 TABLET BY MOUTH IN THE MORNING AND 1 AT NOON AND 1 AT BEDTIME  ?  montelukast (SINGULAIR) 10 MG tablet TAKE 1 TABLET BY MOUTH ONCE DAILY IN THE EVENING  ? Multiple Vitamin (MULTIVITAMIN) tablet Take 1 tablet by mouth daily.  ? Omeprazole 20 MG TBEC Take 20 mg by mouth 2 (two) times daily before a meal.   ? Potassium Chloride ER 20 MEQ TBCR Take 1 tablet by mouth 2 (two) times daily.  ? rOPINIRole (REQUIP) 0.5 MG tablet TAKE 1 TABLET BY MOUTH AT BEDTIME (Patient taking differently: at bedtime as needed.)  ? rosuvastatin (CRESTOR) 40 MG tablet Take 1 tablet (40 mg total) by mouth daily.  ? Semaglutide (OZEMPIC, 1 MG/DOSE, [place]) Inject 1 mg into the skin 2 (two) times a week. (Patient not taking: Reported on 04/02/2022)  ? sertraline (ZOLOFT) 50 MG tablet Take 1 tablet by mouth twice daily  ? tamsulosin (FLOMAX) 0.4 MG CAPS capsule Take 1 capsule by mouth at bedtime  ? torsemide (DEMADEX) 20 MG tablet Take 40 mg by mouth 2 (two) times daily.  ? VENTOLIN HFA 108 (90 Base) MCG/ACT inhaler INHALE 2 PUFFS BY MOUTH EVERY 4 HOURS AS NEEDED FOR WHEEZING FOR SHORTNESS OF BREATH  ? ?No facility-administered encounter medications on file as of 04/08/2022.  ? ? ?Recent Relevant Labs: ?Lab Results  ?Component Value Date/Time  ? HGBA1C 7.7 (H) 04/02/2022 02:32 PM  ? HGBA1C 7.5 (H) 12/24/2021 01:15 PM  ? MICROALBUR 80 01/14/2021 11:53 AM  ? MICROALBUR 150 05/31/2020 11:09 AM  ?  ?Kidney Function ?  Lab Results  ?Component Value Date/Time  ? CREATININE 1.54 (H) 04/02/2022 02:32 PM  ? CREATININE 1.75 (H) 01/23/2022 11:52 AM  ? GFRNONAA 52 (L) 12/10/2020 01:42 PM  ? GFRAA 60 12/10/2020 01:42 PM  ? ? ? ?Current antihyperglycemic regimen:  ?Farxiga 10 mg daily (PAP) ?Tresiba 55 units daily (PAP) ?Ozempic 1g twice/week (2 mg weekly) PAP) ? ?Patient verbally confirms he is taking the above medications as directed. Yes ? ?What recent interventions/DTPs have been made to improve glycemic control:  ?Pt denies any changes ? ?Have there been any recent hospitalizations or ED visits since last visit with CPP?  No ? ?Patient denies hypoglycemic symptoms, including None ? ?Patient denies hyperglycemic symptoms, including none ? ?How often are you checking your blood sugar? once daily- ?l ?What are your blood sugars ranging? 84, 124,125 ? ?On insulin? Yes, 55units daily  ? ?During the week, how often does your blood glucose drop below 70? Never ? ?Are you checking your feet daily/regularly? Yes ? ?Adherence Review: ?Is the patient currently on a STATIN medication? Yes ?Is the patient currently on ACE/ARB medication? Yes ?Does the patient have >5 day gap between last estimated fill dates? CPP to review ? ?Care Gaps: ?Last eye exam / Retinopathy Screening? Never done  ?Last Annual Wellness Visit? Scheduled 05/06/22 ?Last Diabetic Foot Exam? 12/24/21 ?  ? ?Star Rating Drugs:  ?Medication:  Last Fill: Day Supply ?Farxiga (PAP) ?Ozempic (PAP) ?  ?[name], CMA ?Clinical Pharmacist Assistant  ?[phone]  ?

## 2022-04-09 NOTE — Assessment & Plan Note (Signed)
The current medical regimen is effective;  continue present plan and medications omeprazole 20 mg 2 oral twice daily. ?

## 2022-04-09 NOTE — Assessment & Plan Note (Signed)
Continue with CPAP

## 2022-04-09 NOTE — Assessment & Plan Note (Signed)
Well controlled.  ?No changes to medicines.  ?Continue to work on eating a healthy diet and exercise.  ?Labs drawn today.  ?Management per specialist. ?

## 2022-04-09 NOTE — Assessment & Plan Note (Signed)
Recommend continue to work on eating healthy diet and exercise.  

## 2022-04-09 NOTE — Assessment & Plan Note (Addendum)
He ran out of breztri for a long time. Using duoneb treatments/albuterol hfa 4-5 times per day. ?Samples of Breztri given.  ?

## 2022-04-09 NOTE — Assessment & Plan Note (Signed)
Management per specialist. 

## 2022-04-09 NOTE — Assessment & Plan Note (Signed)
Eliquis for atrial fibrillation. ?

## 2022-04-09 NOTE — Assessment & Plan Note (Addendum)
Control: not at goal.  ?Recommend check sugars fasting daily. ?Recommend check feet daily. ?Recommend annual eye exams. ?Medicines: farxiga 10 mg daily, tresiba 55 U daily. Patient has not been taking ozempic 2 mg weekly due to not receiving yet due to clerical error. ?Continue to work on eating a healthy diet and exercise.  ?Labs drawn today.   ?

## 2022-04-09 NOTE — Assessment & Plan Note (Signed)
The current medical regimen is effective;  continue present plan and medications. Management per specialist.   

## 2022-04-12 ENCOUNTER — Encounter: Payer: Self-pay | Admitting: Family Medicine

## 2022-04-12 NOTE — Assessment & Plan Note (Signed)
Well controlled.  No changes to medicines. Continue crestor 40 mg before bed. Continue to work on eating a healthy diet and exercise.  Labs drawn today.   

## 2022-04-12 NOTE — Assessment & Plan Note (Signed)
Goes to pain clinic.  ? ?

## 2022-04-13 NOTE — Addendum Note (Signed)
Addended byRochel Brome on: 04/13/2022 12:21 AM ? ? Modules accepted: Orders ? ?

## 2022-04-16 ENCOUNTER — Telehealth: Payer: Self-pay

## 2022-04-16 NOTE — Progress Notes (Signed)
Called pt wife and informed them that an order for Ozempic was sent in for patient on 5/4 might take another week or 2 before it arrives to the office.   Wife informed me that their daughter stated it was in the office yesterday and has picked this up from the office.  [name], [place] Pharmacist Assistant  [phone]

## 2022-04-30 ENCOUNTER — Telehealth: Payer: Self-pay

## 2022-04-30 NOTE — Chronic Care Management (AMB) (Signed)
[employer] patient assistance program notification:  120- day supply of Tresiba 100 u/ml and Novofine tip needles should arrive to the office in 10-14 business days. Patient has 1  refill remaining and enrollment will expire on 10/29/2022.  The next refill for patient will be fulfilled on 07/03/2022  Pattricia Boss, [place] Pharmacist Assistant [phone]

## 2022-05-06 ENCOUNTER — Ambulatory Visit: Payer: Medicare HMO

## 2022-05-06 DIAGNOSIS — M961 Postlaminectomy syndrome, not elsewhere classified: Secondary | ICD-10-CM | POA: Diagnosis not present

## 2022-05-06 DIAGNOSIS — M533 Sacrococcygeal disorders, not elsewhere classified: Secondary | ICD-10-CM | POA: Diagnosis not present

## 2022-05-06 DIAGNOSIS — G894 Chronic pain syndrome: Secondary | ICD-10-CM | POA: Diagnosis not present

## 2022-05-20 DIAGNOSIS — L601 Onycholysis: Secondary | ICD-10-CM | POA: Diagnosis not present

## 2022-05-20 DIAGNOSIS — L0390 Cellulitis, unspecified: Secondary | ICD-10-CM | POA: Diagnosis not present

## 2022-05-27 ENCOUNTER — Ambulatory Visit: Payer: Medicare HMO

## 2022-06-01 ENCOUNTER — Telehealth: Payer: Self-pay

## 2022-06-01 NOTE — Progress Notes (Signed)
Chronic Care Management Pharmacy Assistant   Name: JAYMIEN LANDIN  MRN: [id number] DOB: 07/28/1946   Reason for Encounter: Disease State call for DM    Recent office visits:  None  Recent consult visits:  05/06/22 (Pain Management) Cathlean Cower, PA-C. Seen for Lumbar Postlaminectomy. Ordered Norco 10-'325mg'$ .   Hospital visits:  None  Medications: Outpatient Encounter Medications as of 06/01/2022  Medication Sig   acetaminophen (TYLENOL) 500 MG tablet Take 1,000 mg by mouth every 6 (six) hours as needed for moderate pain or headache.   amiodarone (PACERONE) 200 MG tablet Take 1 tablet by mouth in the evening   apixaban (ELIQUIS) 5 MG TABS tablet Take 1 tablet (5 mg total) by mouth 2 (two) times daily.   BD PEN NEEDLE MICRO U/F 32G X 6 MM MISC SMARTSIG:1 Needle SUB-Q Daily   Budeson-Glycopyrrol-Formoterol (BREZTRI AEROSPHERE) 160-9-4.8 MCG/ACT AERO Inhale 2 puffs into the lungs 2 (two) times daily.   cholecalciferol (VITAMIN D3) 25 MCG (1000 UT) tablet Take 1,000 Units by mouth daily.    dapagliflozin propanediol (FARXIGA) 10 MG TABS tablet Take 1 tablet (10 mg total) by mouth daily before breakfast.   folic acid (FOLVITE) 035 MCG tablet Take 800 mcg by mouth daily.   HYDROcodone-acetaminophen (NORCO) 10-325 MG tablet Take 1 tablet by mouth every 4 (four) hours as needed for moderate pain.   insulin degludec (TRESIBA FLEXTOUCH) 100 UNIT/ML FlexTouch Pen Inject 55 Units into the skin daily.   ipratropium-albuterol (DUONEB) 0.5-2.5 (3) MG/3ML SOLN Take 3 mLs by nebulization every 6 (six) hours as needed (shortness of breath).   levocetirizine (XYZAL) 5 MG tablet Take 5 mg by mouth daily as needed for allergies.   metolazone (ZAROXOLYN) 5 MG tablet Take 5 mg by mouth once a week. Uses prn if swollen once weekly.   metoprolol tartrate (LOPRESSOR) 50 MG tablet TAKE 1 TABLET BY MOUTH IN THE MORNING AND 1 AT NOON AND 1 AT BEDTIME   montelukast (SINGULAIR) 10 MG tablet TAKE 1 TABLET  BY MOUTH ONCE DAILY IN THE EVENING   Multiple Vitamin (MULTIVITAMIN) tablet Take 1 tablet by mouth daily.   Omeprazole 20 MG TBEC Take 20 mg by mouth 2 (two) times daily before a meal.    Potassium Chloride ER 20 MEQ TBCR Take 1 tablet by mouth 2 (two) times daily.   rOPINIRole (REQUIP) 0.5 MG tablet TAKE 1 TABLET BY MOUTH AT BEDTIME (Patient taking differently: at bedtime as needed.)   rosuvastatin (CRESTOR) 40 MG tablet Take 1 tablet (40 mg total) by mouth daily.   Semaglutide (OZEMPIC, 1 MG/DOSE, [place]) Inject 1 mg into the skin 2 (two) times a week. (Patient not taking: Reported on 04/02/2022)   sertraline (ZOLOFT) 50 MG tablet Take 1 tablet by mouth twice daily   tamsulosin (FLOMAX) 0.4 MG CAPS capsule Take 1 capsule by mouth at bedtime   torsemide (DEMADEX) 20 MG tablet Take 40 mg by mouth 2 (two) times daily.   VENTOLIN HFA 108 (90 Base) MCG/ACT inhaler INHALE 2 PUFFS BY MOUTH EVERY 4 HOURS AS NEEDED FOR WHEEZING FOR SHORTNESS OF BREATH   No facility-administered encounter medications on file as of 06/01/2022.    Recent Relevant Labs: Lab Results  Component Value Date/Time   HGBA1C 7.7 (H) 04/02/2022 02:32 PM   HGBA1C 7.5 (H) 12/24/2021 01:15 PM   MICROALBUR 80 01/14/2021 11:53 AM   MICROALBUR 150 05/31/2020 11:09 AM    Kidney Function Lab Results  Component Value Date/Time  CREATININE 1.54 (H) 04/02/2022 02:32 PM   CREATININE 1.75 (H) 01/23/2022 11:52 AM   GFRNONAA 52 (L) 12/10/2020 01:42 PM   GFRAA 60 12/10/2020 01:42 PM     Current antihyperglycemic regimen:  Farxiga 10 mg daily (PAP) Tresiba 55 units daily (PAP) Ozempic 1g twice/week (2 mg weekly) PAP) Patient verbally confirms he is taking the above medications as directed. Yes  What recent interventions/DTPs have been made to improve glycemic control:  Pt denies any changes   Have there been any recent hospitalizations or ED visits since last visit with CPP? No  Patient denies hypoglycemic symptoms, including  None  Patient denies hyperglycemic symptoms, including none  How often are you checking your blood sugar? once daily  What are your blood sugars ranging? 120-145  On insulin? Yes How many units:55 units   During the week, how often does your blood glucose drop below 70? Never  Are you checking your feet daily/regularly? Yes  Adherence Review: Is the patient currently on a STATIN medication? Yes Is the patient currently on ACE/ARB medication? No Does the patient have >5 day gap between last estimated fill dates? CPP to review  Care Gaps: Last eye exam / Retinopathy Screening? Never done  Last Annual Wellness Visit? Scheduled 05/06/22 Last Diabetic Foot Exam? 12/24/21    Star Rating Drugs:  Medication:  Last Fill: Day Supply Farxiga (PAP) Ozempic (PAP)  [name], [place] Pharmacist Assistant  [phone]

## 2022-06-09 ENCOUNTER — Other Ambulatory Visit: Payer: Self-pay | Admitting: Physician Assistant

## 2022-06-09 ENCOUNTER — Other Ambulatory Visit: Payer: Self-pay | Admitting: Cardiology

## 2022-06-13 DIAGNOSIS — J019 Acute sinusitis, unspecified: Secondary | ICD-10-CM | POA: Diagnosis not present

## 2022-06-13 DIAGNOSIS — K121 Other forms of stomatitis: Secondary | ICD-10-CM | POA: Diagnosis not present

## 2022-06-16 ENCOUNTER — Other Ambulatory Visit: Payer: Self-pay | Admitting: Physician Assistant

## 2022-06-24 ENCOUNTER — Telehealth: Payer: Self-pay

## 2022-06-24 NOTE — Chronic Care Management (AMB) (Addendum)
[employer] patient assistance program notification:  120- day supply of Ozempic '1mg'$  will be filled on 07/02/2022 and should arrive to the office in 10-14 business days.    120- day supply of Novofine needles and Tresiba 100 u/ml will be filled on 07/10/2022 and should arrive to the office in 10-14 business days.   Pattricia Boss, [place] Pharmacist Assistant [phone]

## 2022-07-06 ENCOUNTER — Telehealth: Payer: Self-pay

## 2022-07-06 NOTE — Progress Notes (Signed)
Chronic Care Management Pharmacy Assistant   Name: Shawn Sullivan  MRN: [id number] DOB: 05/24/1946   Reason for Encounter: Disease State call for DM    Recent office visits:  None  Recent consult visits:  None  Hospital visits:  None  Medications: Outpatient Encounter Medications as of 07/06/2022  Medication Sig   acetaminophen (TYLENOL) 500 MG tablet Take 1,000 mg by mouth every 6 (six) hours as needed for moderate pain or headache.   amiodarone (PACERONE) 200 MG tablet Take 1 tablet by mouth in the evening   apixaban (ELIQUIS) 5 MG TABS tablet Take 1 tablet (5 mg total) by mouth 2 (two) times daily.   BD PEN NEEDLE MICRO U/F 32G X 6 MM MISC SMARTSIG:1 Needle SUB-Q Daily   Budeson-Glycopyrrol-Formoterol (BREZTRI AEROSPHERE) 160-9-4.8 MCG/ACT AERO Inhale 2 puffs into the lungs 2 (two) times daily.   cholecalciferol (VITAMIN D3) 25 MCG (1000 UT) tablet Take 1,000 Units by mouth daily.    dapagliflozin propanediol (FARXIGA) 10 MG TABS tablet Take 1 tablet (10 mg total) by mouth daily before breakfast.   folic acid (FOLVITE) 144 MCG tablet Take 800 mcg by mouth daily.   HYDROcodone-acetaminophen (NORCO) 10-325 MG tablet Take 1 tablet by mouth every 4 (four) hours as needed for moderate pain.   insulin degludec (TRESIBA FLEXTOUCH) 100 UNIT/ML FlexTouch Pen Inject 55 Units into the skin daily.   ipratropium-albuterol (DUONEB) 0.5-2.5 (3) MG/3ML SOLN Take 3 mLs by nebulization every 6 (six) hours as needed (shortness of breath).   levocetirizine (XYZAL) 5 MG tablet Take 5 mg by mouth daily as needed for allergies.   metolazone (ZAROXOLYN) 5 MG tablet Take 5 mg by mouth once a week. Uses prn if swollen once weekly.   metoprolol tartrate (LOPRESSOR) 50 MG tablet TAKE 1 TABLET BY MOUTH IN THE MORNING AND 1 AT NOON AND 1 AT BEDTIME   montelukast (SINGULAIR) 10 MG tablet TAKE 1 TABLET BY MOUTH ONCE DAILY IN THE EVENING   Multiple Vitamin (MULTIVITAMIN) tablet Take 1 tablet by mouth  daily.   Omeprazole 20 MG TBEC Take 20 mg by mouth 2 (two) times daily before a meal.    Potassium Chloride ER 20 MEQ TBCR Take 1 tablet by mouth 2 (two) times daily.   rOPINIRole (REQUIP) 0.5 MG tablet TAKE 1 TABLET BY MOUTH AT BEDTIME (Patient taking differently: at bedtime as needed.)   rosuvastatin (CRESTOR) 40 MG tablet Take 1 tablet (40 mg total) by mouth daily.   Semaglutide (OZEMPIC, 1 MG/DOSE, ) Inject 1 mg into the skin 2 (two) times a week. (Patient not taking: Reported on 04/02/2022)   sertraline (ZOLOFT) 50 MG tablet Take 1 tablet by mouth twice daily   tamsulosin (FLOMAX) 0.4 MG CAPS capsule Take 1 capsule by mouth at bedtime   torsemide (DEMADEX) 20 MG tablet Take 40 mg by mouth 2 (two) times daily.   VENTOLIN HFA 108 (90 Base) MCG/ACT inhaler INHALE 2 PUFFS BY MOUTH EVERY 4 HOURS AS NEEDED FOR WHEEZING FOR SHORTNESS OF BREATH   No facility-administered encounter medications on file as of 07/06/2022.   Recent Relevant Labs: Lab Results  Component Value Date/Time   HGBA1C 7.7 (H) 04/02/2022 02:32 PM   HGBA1C 7.5 (H) 12/24/2021 01:15 PM   MICROALBUR 80 01/14/2021 11:53 AM   MICROALBUR 150 05/31/2020 11:09 AM    Kidney Function Lab Results  Component Value Date/Time   CREATININE 1.54 (H) 04/02/2022 02:32 PM   CREATININE 1.75 (H) 01/23/2022 11:52 AM  GFRNONAA 52 (L) 12/10/2020 01:42 PM   GFRAA 60 12/10/2020 01:42 PM     Current antihyperglycemic regimen:  Farxiga 10 mg daily (PAP) Tresiba 55 units daily (PAP) Ozempic 1g twice/week (2 mg weekly) PAP)  Adherence Review: Is the patient currently on a STATIN medication? Yes Is the patient currently on ACE/ARB medication? No Does the patient have >5 day gap between last estimated fill dates? CPP to review  Care Gaps: Last eye exam / Retinopathy Screening? Never done  Last Annual Wellness Visit? Scheduled 05/06/22 Last Diabetic Foot Exam? 12/24/21   Star Rating Drugs:  Medication:  Last Fill: Day Supply Farxiga  (PAP) Ozempic (PAP)  Unable to reach pt after several attempts    [name], [place] Pharmacist Assistant  [phone]

## 2022-07-07 ENCOUNTER — Other Ambulatory Visit: Payer: Self-pay | Admitting: Family Medicine

## 2022-07-13 NOTE — Progress Notes (Signed)
Pt called back and reported his sugars as follows:   07/12/22 137 fasting, 160 after a meal   He does not check everyday. He checks them a few times a week. He is taking all his medications as prescribed. Denies any hypo/hyperglycemic symptoms    [name], [place] Pharmacist Assistant  [phone]

## 2022-07-16 ENCOUNTER — Telehealth: Payer: Self-pay

## 2022-07-16 ENCOUNTER — Ambulatory Visit (INDEPENDENT_AMBULATORY_CARE_PROVIDER_SITE_OTHER): Payer: Medicare HMO | Admitting: Family Medicine

## 2022-07-16 VITALS — BP 120/84 | HR 70 | Temp 98.7°F | Resp 14 | Ht 71.0 in | Wt 317.0 lb

## 2022-07-16 DIAGNOSIS — Z1159 Encounter for screening for other viral diseases: Secondary | ICD-10-CM | POA: Diagnosis not present

## 2022-07-16 DIAGNOSIS — G4733 Obstructive sleep apnea (adult) (pediatric): Secondary | ICD-10-CM

## 2022-07-16 DIAGNOSIS — D6869 Other thrombophilia: Secondary | ICD-10-CM | POA: Diagnosis not present

## 2022-07-16 DIAGNOSIS — J410 Simple chronic bronchitis: Secondary | ICD-10-CM

## 2022-07-16 DIAGNOSIS — N1831 Chronic kidney disease, stage 3a: Secondary | ICD-10-CM | POA: Diagnosis not present

## 2022-07-16 DIAGNOSIS — F1120 Opioid dependence, uncomplicated: Secondary | ICD-10-CM

## 2022-07-16 DIAGNOSIS — E0842 Diabetes mellitus due to underlying condition with diabetic polyneuropathy: Secondary | ICD-10-CM | POA: Diagnosis not present

## 2022-07-16 DIAGNOSIS — G894 Chronic pain syndrome: Secondary | ICD-10-CM

## 2022-07-16 DIAGNOSIS — E785 Hyperlipidemia, unspecified: Secondary | ICD-10-CM

## 2022-07-16 DIAGNOSIS — Z6841 Body Mass Index (BMI) 40.0 and over, adult: Secondary | ICD-10-CM

## 2022-07-16 DIAGNOSIS — N1832 Chronic kidney disease, stage 3b: Secondary | ICD-10-CM

## 2022-07-16 DIAGNOSIS — K219 Gastro-esophageal reflux disease without esophagitis: Secondary | ICD-10-CM

## 2022-07-16 DIAGNOSIS — I5041 Acute combined systolic (congestive) and diastolic (congestive) heart failure: Secondary | ICD-10-CM

## 2022-07-16 DIAGNOSIS — I482 Chronic atrial fibrillation, unspecified: Secondary | ICD-10-CM | POA: Diagnosis not present

## 2022-07-16 DIAGNOSIS — Z794 Long term (current) use of insulin: Secondary | ICD-10-CM

## 2022-07-16 DIAGNOSIS — E66813 Obesity, class 3: Secondary | ICD-10-CM

## 2022-07-16 DIAGNOSIS — I130 Hypertensive heart and chronic kidney disease with heart failure and stage 1 through stage 4 chronic kidney disease, or unspecified chronic kidney disease: Secondary | ICD-10-CM

## 2022-07-16 MED ORDER — TIZANIDINE HCL 2 MG PO TABS: 2.0000 mg | ORAL_TABLET | Freq: Every day | ORAL | 2 refills | Status: DC

## 2022-07-16 NOTE — Assessment & Plan Note (Addendum)
Stable.  Continue on amiodarone 200 mg daily, metoprolol 50 mg three times a day.  and eliquis 5 mg twice daily.   Management per specialist.

## 2022-07-16 NOTE — Assessment & Plan Note (Addendum)
Continue on hydrocodone/APAP 10/325 mg four times a day as needed. Management per specialist.

## 2022-07-16 NOTE — Patient Instructions (Addendum)
Needs tetanus and shingrix vaccines at pharmacy.  Do not  eat at night. Start on tizanidine 2 mg before bed

## 2022-07-16 NOTE — Assessment & Plan Note (Signed)
Continue with CPAP

## 2022-07-16 NOTE — Assessment & Plan Note (Addendum)
Control: improved.  Recommend check sugars fasting daily. Recommend check feet daily. Recommend annual eye exams. Medicines: farxiga 10 mg daily, tresiba 58 U daily Continue to work on eating a healthy diet and exercise.  Labs drawn today.

## 2022-07-16 NOTE — Assessment & Plan Note (Signed)
The current medical regimen is effective;  continue present plan and medications. Taking Eliquis for Afib.

## 2022-07-16 NOTE — Assessment & Plan Note (Signed)
Well controlled.  No changes to medicines. crestor 40 mg daily and coenzyme q10. Continue to work on eating a healthy diet and exercise.  Labs drawn today.

## 2022-07-16 NOTE — Chronic Care Management (AMB) (Signed)
[employer] patient assistance program notification:  120- day supply of Ozempic '1mg'$  was filled on 07/06/2022 and should arrive to the office in 10-14 business days. Patient has 1  refill remaining and enrollment will expire on 10/29/2022.  The next refill for patient will be fulfilled on 09/20/2022.  Pattricia Boss, [place] Pharmacist Assistant [phone]

## 2022-07-16 NOTE — Assessment & Plan Note (Addendum)
The current medical regimen is effective;  continue present plan and medications. omeprazole 20 mg 1 pill  oral twice daily Do not  eat at night.

## 2022-07-16 NOTE — Assessment & Plan Note (Signed)
Management per specialist. 

## 2022-07-16 NOTE — Assessment & Plan Note (Signed)
Well controlled.  No changes to medicines. metoprolol tartrate 50 mg three times a day, torsemide 20 mg 2 po twice daily, metolazone 5 mg once a wk as needed swelling. and potassium 20 meq twice daily Continue to work on eating a healthy diet and exercise.  Labs drawn today.

## 2022-07-16 NOTE — Progress Notes (Signed)
Subjective:  Patient ID: Shawn Sullivan, male    DOB: 12-31-1945  Age: 76 y.o. MRN: [id number]  Chief Complaint  Patient presents with   Diabetes   Hyperlipidemia   Hypertension   HPI Diabetes:  Complications: neuropathy.  Glucose checking: every 2-3 days Glucose logs: 130-150 fasting. Hypoglycemia: no Most recent A1C: 7.7 Current medications: farxiga 10 mg daily, tresiba 58 U daily. Patient has not been taking ozempic 1 mg weekly.   Last Eye Exam: 09/2021 Foot checks:continues to check daily.   Hyperlipidemia: Current medications: crestor 40 mg daily and coenzyme q10.   Hypertension: Complications: CKD. Kidney function stable.  Current medications: on metoprolol tartrate 50 mg three times a day, torsemide 20 mg 2 po twice daily, metolazone 5 mg once a wk as needed swelling. and potassium 20 meq twice daily.  Medications: checked and unchanged. Swelling has improved.   Atrial fibrillation: on amiodarone 200 mg daily, metoprolol 50 mg three times a day.  and eliquis 5 mg twice daily. Dr. Bettina Gavia gave good report.  RLS: on requip 0.5 mg once daily as needed.  Chronic pain syndrome: Sees pain clinic. O  Mild recurrent depression: on zoloft 50 mg twice daily  GERD: on omeprazole 20 mg 1 pill  oral twice daily  COPD: Breztri 2 puffs twice daily. Using duoneb treatments/albuterol hfa once a day a most. Often does not need it at all. Marland Kitchenalso on singulair and xyzal.   Diet: not healthy. Eating late at night. exercise: walking some.    Patient states he has fallen twice over the past three months.  Current Outpatient Medications on File Prior to Visit  Medication Sig Dispense Refill   acetaminophen (TYLENOL) 500 MG tablet Take 1,000 mg by mouth every 6 (six) hours as needed for moderate pain or headache.     amiodarone (PACERONE) 200 MG tablet Take 1 tablet by mouth in the evening 90 tablet 1   apixaban (ELIQUIS) 5 MG TABS tablet Take 1 tablet (5 mg total) by mouth 2 (two) times  daily. 180 tablet 1   BD PEN NEEDLE MICRO U/F 32G X 6 MM MISC SMARTSIG:1 Needle SUB-Q Daily     Budeson-Glycopyrrol-Formoterol (BREZTRI AEROSPHERE) 160-9-4.8 MCG/ACT AERO Inhale 2 puffs into the lungs 2 (two) times daily. 10.7 g 3   cholecalciferol (VITAMIN D3) 25 MCG (1000 UT) tablet Take 1,000 Units by mouth daily.      dapagliflozin propanediol (FARXIGA) 10 MG TABS tablet Take 1 tablet (10 mg total) by mouth daily before breakfast. 90 tablet 3   folic acid (FOLVITE) 154 MCG tablet Take 800 mcg by mouth daily.     HYDROcodone-acetaminophen (NORCO) 10-325 MG tablet Take 1 tablet by mouth every 4 (four) hours as needed for moderate pain.     insulin degludec (TRESIBA FLEXTOUCH) 100 UNIT/ML FlexTouch Pen Inject 55 Units into the skin daily.     ipratropium-albuterol (DUONEB) 0.5-2.5 (3) MG/3ML SOLN Take 3 mLs by nebulization every 6 (six) hours as needed (shortness of breath). 360 mL 0   levocetirizine (XYZAL) 5 MG tablet Take 5 mg by mouth daily as needed for allergies.     metolazone (ZAROXOLYN) 5 MG tablet Take 5 mg by mouth once a week. Uses prn if swollen once weekly.     metoprolol tartrate (LOPRESSOR) 50 MG tablet TAKE 1 TABLET BY MOUTH IN THE MORNING AND 1 AT NOON AND 1 AT BEDTIME 270 tablet 0   montelukast (SINGULAIR) 10 MG tablet TAKE 1 TABLET BY MOUTH ONCE  DAILY IN THE EVENING 90 tablet 0   Multiple Vitamin (MULTIVITAMIN) tablet Take 1 tablet by mouth daily.     Omeprazole 20 MG TBEC Take 20 mg by mouth 2 (two) times daily before a meal.      Potassium Chloride ER 20 MEQ TBCR Take 1 tablet by mouth 2 (two) times daily.     rOPINIRole (REQUIP) 0.5 MG tablet TAKE 1 TABLET BY MOUTH AT BEDTIME (Patient taking differently: at bedtime as needed.) 30 tablet 2   rosuvastatin (CRESTOR) 40 MG tablet Take 1 tablet by mouth once daily 90 tablet 0   Semaglutide (OZEMPIC, 1 MG/DOSE, [place]) Inject 1 mg into the skin once a week.     sertraline (ZOLOFT) 50 MG tablet Take 1 tablet by mouth twice daily 180  tablet 0   tamsulosin (FLOMAX) 0.4 MG CAPS capsule Take 1 capsule by mouth at bedtime 90 capsule 0   torsemide (DEMADEX) 20 MG tablet Take 40 mg by mouth 2 (two) times daily.     VENTOLIN HFA 108 (90 Base) MCG/ACT inhaler INHALE 2 PUFFS BY MOUTH EVERY 4 HOURS AS NEEDED FOR WHEEZING FOR SHORTNESS OF BREATH 18 g 3   No current facility-administered medications on file prior to visit.   Past Medical History:  Diagnosis Date   Anticoagulated 07/01/2018   Anticoagulated 07/01/2018   Benign essential hypertension 05/06/2013   Chronic obstructive pulmonary disease ([place]) 11/05/2015   COPD (chronic obstructive pulmonary disease) ([place]) 07/01/2018   Dermatochalasis 12/16/2015   Disorder of bursae and tendons in shoulder region 01/05/2014   Generalized edema 06/22/2016   Hyperlipidemia 12/16/2015   Hypertensive heart disease 07/01/2018   Low back pain 08/17/2013   Lumbosacral spondylosis 05/06/2013   Obstructive sleep apnea 07/01/2018   Persistent atrial fibrillation ([place]) 07/01/2018   Piriformis syndrome 05/03/2014   Postlaminectomy syndrome, lumbar region 05/06/2013   Presbyopia of both eyes 12/16/2015   Restless legs syndrome 03/23/2016   Restrictive lung disease 06/22/2016   Sleep apnea 05/06/2013   Type 2 diabetes mellitus without complications ([place]) 38/11/8297   Past Surgical History:  Procedure Laterality Date   APPENDECTOMY     BACK SURGERY     CARDIOVERSION N/A 12/01/2018   Procedure: CARDIOVERSION;  Surgeon: Sanda Klein, MD;  Location: [facility];  Service: Cardiovascular;  Laterality: N/A;   CATARACT EXTRACTION Bilateral    LUMBAR FUSION     NECK SURGERY      Family History  Problem Relation Age of Onset   Atrial fibrillation Mother    Cancer Mother    Heart disease Father    Social History   Socioeconomic History   Marital status: Married    Spouse name: Not on file   Number of children: Not on file   Years of education: Not on file   Highest education level: Not on file  Occupational  History   Not on file  Tobacco Use   Smoking status: Former    Types: Cigarettes    Quit date: 1993    Years since quitting: 30.6   Smokeless tobacco: Never  Vaping Use   Vaping Use: Never used  Substance and Sexual Activity   Alcohol use: Not Currently   Drug use: Not Currently    Types: Hydrocodone   Sexual activity: Not on file  Other Topics Concern   Not on file  Social History Narrative   Not on file   Social Determinants of Health   Financial Resource Strain: High Risk (02/03/2022)   Overall  Financial Resource Strain (CARDIA)    Difficulty of Paying Living Expenses: Very hard  Food Insecurity: No Food Insecurity (07/09/2020)   Hunger Vital Sign    Worried About Running Out of Food in the Last Year: Never true    Ran Out of Food in the Last Year: Never true  Transportation Needs: No Transportation Needs (02/03/2022)   PRAPARE - Hydrologist (Medical): No    Lack of Transportation (Non-Medical): No  Physical Activity: Inactive (11/04/2020)   Exercise Vital Sign    Days of Exercise per Week: 0 days    Minutes of Exercise per Session: 0 min  Stress: Not on file  Social Connections: Not on file    Review of Systems  Constitutional:  Negative for appetite change, fatigue and fever.  HENT:  Negative for congestion, ear pain, sinus pressure and sore throat.   Respiratory:  Negative for cough, chest tightness, shortness of breath and wheezing.   Cardiovascular:  Positive for leg swelling (some). Negative for chest pain and palpitations.  Gastrointestinal:  Negative for abdominal pain, constipation, diarrhea, nausea and vomiting.  Genitourinary:  Negative for dysuria and hematuria.  Musculoskeletal:  Positive for myalgias (left leg). Negative for arthralgias, back pain and joint swelling.  Skin:  Negative for rash.  Neurological:  Negative for dizziness, weakness and headaches.  Psychiatric/Behavioral:  Negative for dysphoric mood. The patient is  not nervous/anxious.      Objective:  BP 120/84   Pulse 70   Temp 98.7 F (37.1 C)   Resp 14   Ht '5\' 11"'$  (1.803 m)   Wt (!) 317 lb (143.8 kg)   BMI 44.21 kg/m      07/23/2022    9:02 PM 04/02/2022   11:08 AM 03/13/2022    3:10 PM  BP/Weight  Systolic BP 287 867 672  Diastolic BP 84 64 78  Wt. (Lbs) 317 317 323.6  BMI 44.21 kg/m2 44.21 kg/m2 45.13 kg/m2    Physical Exam Vitals reviewed.  Constitutional:      Appearance: Normal appearance. He is obese.  Cardiovascular:     Rate and Rhythm: Normal rate and regular rhythm.     Pulses: Normal pulses.     Heart sounds: Normal heart sounds.  Pulmonary:     Effort: Pulmonary effort is normal.     Breath sounds: Normal breath sounds.  Abdominal:     General: Abdomen is flat. Bowel sounds are normal.     Palpations: Abdomen is soft.     Tenderness: There is no abdominal tenderness.  Neurological:     Mental Status: He is alert and oriented to person, place, and time.  Psychiatric:        Mood and Affect: Mood normal.        Behavior: Behavior normal.     Diabetic Foot Exam - Simple   Simple Foot Form  07/16/2022  9:59 AM  Visual Inspection See comments: Yes Sensation Testing See comments: Yes Pulse Check Posterior Tibialis and Dorsalis pulse intact bilaterally: Yes Comments Left great toenail cut partly off.  Numbness in feet BL      Lab Results  Component Value Date   WBC 11.0 (H) 07/16/2022   HGB 11.0 (L) 07/16/2022   HCT 38.5 07/16/2022   PLT 176 07/16/2022   GLUCOSE 96 07/16/2022   CHOL 130 07/16/2022   TRIG 164 (H) 07/16/2022   HDL 35 (L) 07/16/2022   LDLCALC 67 07/16/2022   ALT 16 07/16/2022  AST 26 07/16/2022   NA 139 07/16/2022   K 3.7 07/16/2022   CL 101 07/16/2022   CREATININE 1.37 (H) 07/16/2022   BUN 15 07/16/2022   CO2 24 07/16/2022   TSH 2.380 04/02/2022   INR 1.1 11/25/2018   HGBA1C 7.3 (H) 07/16/2022   MICROALBUR 80 01/14/2021      Assessment & Plan:   Problem List Items  Addressed This Visit       Cardiovascular and Mediastinum   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  No changes to medicines. metoprolol tartrate 50 mg three times a day, torsemide 20 mg 2 po twice daily, metolazone 5 mg once a wk as needed swelling. and potassium 20 meq twice daily Continue to work on eating a healthy diet and exercise.  Labs drawn today.       Relevant Orders   CBC With Diff/Platelet (Completed)   Comprehensive metabolic panel (Completed)   Atrial fibrillation, chronic (HCC)    Stable.  Continue on amiodarone 200 mg daily, metoprolol 50 mg three times a day.  and eliquis 5 mg twice daily.   Management per specialist.        Respiratory   Obstructive sleep apnea    Continue with CPAP.      Chronic obstructive pulmonary disease (HCC)    Stable  Breztri 2 puffs twice daily. Using duoneb treatments/albuterol hfa once a day        Digestive   Gastroesophageal reflux disease without esophagitis    The current medical regimen is effective;  continue present plan and medications. omeprazole 20 mg 1 pill  oral twice daily Do not  eat at night.        Endocrine   Diabetes mellitus due to underlying condition, controlled, with diabetic polyneuropathy, with long-term current use of insulin ([place]) - Primary    Control: improved.  Recommend check sugars fasting daily. Recommend check feet daily. Recommend annual eye exams. Medicines: farxiga 10 mg daily, tresiba 58 U daily Continue to work on eating a healthy diet and exercise.  Labs drawn today.        Relevant Medications   tiZANidine (ZANAFLEX) 2 MG tablet   Other Relevant Orders   Hemoglobin A1c (Completed)     Genitourinary   CKD (chronic kidney disease) stage 3, GFR 30-59 ml/min (HCC)    Management per specialist.          Hematopoietic and Hemostatic   Acquired  thrombophilia ([place])    The current medical regimen is effective;  continue present plan and medications. Taking Eliquis for Afib.        Other   Hyperlipidemia    Well controlled.  No changes to medicines. crestor 40 mg daily and coenzyme q10. Continue to work on eating a healthy diet and exercise.  Labs drawn today.       Relevant Orders   Lipid panel (Completed)   Chronic pain syndrome    Continue on hydrocodone/APAP 10/325 mg four times a day as needed. Management per specialist.      Relevant Medications   tiZANidine (ZANAFLEX) 2 MG tablet   Class 3 severe obesity due to excess calories with serious comorbidity and body mass index (BMI) of 40.0 to 44.9 in adult [facility])    Recommend continue to work on eating healthy diet.       Uncomplicated opioid dependence ([place])  Management per specialist.      Other Visit Diagnoses     Need for hepatitis C screening test       Relevant Orders   HCV Ab w Reflex to Quant PCR (Completed)     .  Meds ordered this encounter  Medications   tiZANidine (ZANAFLEX) 2 MG tablet    Sig: Take 1 tablet (2 mg total) by mouth at bedtime.    Dispense:  30 tablet    Refill:  2    Orders Placed This Encounter  Procedures   CBC With Diff/Platelet   Comprehensive metabolic panel   Lipid panel   Hemoglobin A1c   HCV Ab w Reflex to Quant PCR   Cardiovascular Risk Assessment   Interpretation:     I,Rosaleigh Brazzel,acting as a scribe for Rochel Brome, MD.,have documented all relevant documentation on the behalf of Rochel Brome, MD,as directed by  Rochel Brome, MD while in the presence of Rochel Brome, MD.   Follow-up: Return in about 3 months (around 10/16/2022) for chronic fasting, awv due now.  An After Visit Summary was printed and given to the patient.  Rochel Brome, MD [facility] [phone]

## 2022-07-17 LAB — CBC WITH DIFF/PLATELET
Basophils Absolute: 0 10*3/uL (ref 0.0–0.2)
Basos: 0 %
EOS (ABSOLUTE): 0.3 10*3/uL (ref 0.0–0.4)
Eos: 2 %
Hematocrit: 38.5 % (ref 37.5–51.0)
Hemoglobin: 11 g/dL — ABNORMAL LOW (ref 13.0–17.7)
Immature Grans (Abs): 0 10*3/uL (ref 0.0–0.1)
Immature Granulocytes: 0 %
Lymphocytes Absolute: 2.5 10*3/uL (ref 0.7–3.1)
Lymphs: 23 %
MCH: 23.2 pg — ABNORMAL LOW (ref 26.6–33.0)
MCHC: 28.6 g/dL — ABNORMAL LOW (ref 31.5–35.7)
MCV: 81 fL (ref 79–97)
Monocytes Absolute: 0.7 10*3/uL (ref 0.1–0.9)
Monocytes: 7 %
Neutrophils Absolute: 7.4 10*3/uL — ABNORMAL HIGH (ref 1.4–7.0)
Neutrophils: 68 %
Platelets: 176 10*3/uL (ref 150–450)
RBC: 4.74 x10E6/uL (ref 4.14–5.80)
RDW: 15.7 % — ABNORMAL HIGH (ref 11.6–15.4)
WBC: 11 10*3/uL — ABNORMAL HIGH (ref 3.4–10.8)

## 2022-07-17 LAB — COMPREHENSIVE METABOLIC PANEL
ALT: 16 IU/L (ref 0–44)
AST: 26 IU/L (ref 0–40)
Albumin/Globulin Ratio: 1.8 (ref 1.2–2.2)
Albumin: 3.9 g/dL (ref 3.8–4.8)
Alkaline Phosphatase: 107 IU/L (ref 44–121)
BUN/Creatinine Ratio: 11 (ref 10–24)
BUN: 15 mg/dL (ref 8–27)
Bilirubin Total: 0.8 mg/dL (ref 0.0–1.2)
CO2: 24 mmol/L (ref 20–29)
Calcium: 9.4 mg/dL (ref 8.6–10.2)
Chloride: 101 mmol/L (ref 96–106)
Creatinine, Ser: 1.37 mg/dL — ABNORMAL HIGH (ref 0.76–1.27)
Globulin, Total: 2.2 g/dL (ref 1.5–4.5)
Glucose: 96 mg/dL (ref 70–99)
Potassium: 3.7 mmol/L (ref 3.5–5.2)
Sodium: 139 mmol/L (ref 134–144)
Total Protein: 6.1 g/dL (ref 6.0–8.5)
eGFR: 53 mL/min/{1.73_m2} — ABNORMAL LOW (ref >59)

## 2022-07-17 LAB — LIPID PANEL
Chol/HDL Ratio: 3.7 ratio (ref 0.0–5.0)
Cholesterol, Total: 130 mg/dL (ref 100–199)
HDL: 35 mg/dL — ABNORMAL LOW (ref >39)
LDL Chol Calc (NIH): 67 mg/dL (ref 0–99)
Triglycerides: 164 mg/dL — ABNORMAL HIGH (ref 0–149)
VLDL Cholesterol Cal: 28 mg/dL (ref 5–40)

## 2022-07-17 LAB — HCV INTERPRETATION

## 2022-07-17 LAB — HCV AB W REFLEX TO QUANT PCR: HCV Ab: NONREACTIVE

## 2022-07-17 LAB — HEMOGLOBIN A1C
Est. average glucose Bld gHb Est-mCnc: 163 mg/dL
Hgb A1c MFr Bld: 7.3 % — ABNORMAL HIGH (ref 4.8–5.6)

## 2022-07-23 ENCOUNTER — Other Ambulatory Visit: Payer: Self-pay | Admitting: Cardiology

## 2022-07-23 ENCOUNTER — Encounter: Payer: Self-pay | Admitting: Family Medicine

## 2022-07-23 NOTE — Assessment & Plan Note (Signed)
Recommend continue to work on eating healthy diet.  

## 2022-07-23 NOTE — Assessment & Plan Note (Signed)
Management per specialist. 

## 2022-07-23 NOTE — Assessment & Plan Note (Signed)
Stable  Breztri 2 puffs twice daily. Using duoneb treatments/albuterol hfa once a day

## 2022-08-04 ENCOUNTER — Ambulatory Visit (INDEPENDENT_AMBULATORY_CARE_PROVIDER_SITE_OTHER): Payer: Medicare HMO

## 2022-08-04 NOTE — Patient Instructions (Signed)
Visit Information   Goals Addressed   None    Patient Care Plan: CCM Pharmacy Care Plan     Problem Identified: dm, afib, chf, hld   Priority: High  Onset Date: 02/07/2021     Long-Range Goal: Disease Management   Start Date: 02/07/2021  Expected End Date: 02/07/2022  Recent Progress: On track  Priority: High  Note:    Current Barriers:  Unable to achieve control of diabetes   Pharmacist Clinical Goal(s):  Over the next 90 days, patient will verbalize ability to afford treatment regimen achieve adherence to monitoring guidelines and medication adherence to achieve therapeutic efficacy achieve control of diabetes as evidenced by a1c through collaboration with PharmD and provider.   Interventions: 1:1 collaboration with Rochel Brome, MD regarding development and update of comprehensive plan of care as evidenced by provider attestation and co-signature Inter-disciplinary care team collaboration (see longitudinal plan of care) Comprehensive medication review performed; medication list updated in electronic medical record  Hyperlipidemia: (LDL goal < 70 Lab Results  Component Value Date   CHOL 130 07/16/2022   CHOL 134 04/02/2022   CHOL 154 12/24/2021   Lab Results  Component Value Date   HDL 35 (L) 07/16/2022   HDL 38 (L) 04/02/2022   HDL 36 (L) 12/24/2021   Lab Results  Component Value Date   LDLCALC 67 07/16/2022   [place] 74 04/02/2022   [place] 87 12/24/2021   Lab Results  Component Value Date   TRIG 164 (H) 07/16/2022   TRIG 120 04/02/2022   TRIG 177 (H) 12/24/2021   Lab Results  Component Value Date   CHOLHDL 3.7 07/16/2022   CHOLHDL 3.5 04/02/2022   CHOLHDL 4.3 12/24/2021  No results found for: "LDLDIRECT" -Controlled -Current treatment: Rosuvastatin 40 mg daily Appropriate, Effective, Safe, Accessible -Medications previously tried: atorvastatin  -Current dietary patterns: daughter tries to get some healthy options when she cooks for them.   -Current exercise habits: minimal due to mobility and back pain  -Educated on Cholesterol goals;  Benefits of statin for ASCVD risk reduction; Importance of limiting foods high in cholesterol; -Counseled on diet and exercise extensively Recommended to continue current medication  Diabetes (A1c goal <8%) Lab Results  Component Value Date   HGBA1C 7.3 (H) 07/16/2022   HGBA1C 7.7 (H) 04/02/2022   HGBA1C 7.5 (H) 12/24/2021   Lab Results  Component Value Date   MICROALBUR 80 01/14/2021   LDLCALC 67 07/16/2022   CREATININE 1.37 (H) 07/16/2022   Lab Results  Component Value Date   NA 139 07/16/2022   K 3.7 07/16/2022   CREATININE 1.37 (H) 07/16/2022   EGFR 53 (L) 07/16/2022   GFRNONAA 52 (L) 12/10/2020   GLUCOSE 96 07/16/2022   Lab Results  Component Value Date   WBC 11.0 (H) 07/16/2022   HGB 11.0 (L) 07/16/2022   HCT 38.5 07/16/2022   MCV 81 07/16/2022   PLT 176 07/16/2022   Wt Readings from Last 3 Encounters:  07/23/22 (!) 317 lb (143.8 kg)  04/02/22 (!) 317 lb (143.8 kg)  03/13/22 (!) 323 lb 9.6 oz (146.8 kg)  -Controlled -Current medications: Farxiga 10 mg daily (PAP) Appropriate, Effective, Safe, Accessible Tresiba 55 units daily (PAP) Appropriate, Effective, Safe, Accessible Ozempic 1g twice/week (2 mg weekly) (PAP) Appropriate, Effective, Safe, Accessible -Medications previously tried:  glipizide, metformin, Soliqua -Current home glucose readings fasting glucose:  September 2022: Daughter doesn't have logs with her March 2023: Hasn't tested this month, was 158 today fasting on the phone. Was (  226, 178, 194, 167, 01/11/22: 334) last month September 2023: 145 today but didn't write down post prandial glucose: not reproted -Denies hypoglycemic/hyperglycemic symptoms -Current meal patterns:  Patient sneaks cookies and snacks. Daughter tries to prepare a balanced meal when cooking for patient. Patient's appetite fluctuates per daughter.  -Current exercise: minimal   -Educated on A1c and blood sugar goals; Complications of diabetes including kidney damage, retinal damage, and cardiovascular disease; Benefits of routine self-monitoring of blood sugar; -Counseled to check feet daily and get yearly eye exams -Counseled on diet and exercise extensively September 2022: Patient needs A1c. Daughter will call and schedule ASAP, I will also verify with front desk. Will have concierge check in monthly (Daughter affirmed plan) to get these sugars under control. Patient is a candidate for Metformin if further medication needed -Candidate for ACE/ARB due to Microalbumin: 80 March 2023: Candidate for ACE/ARB. Will have patient check sugars daily and concierge will reach out in 1 week  Atrial Fibrillation (Goal: prevent stroke and major bleeding) -Managed by Dr. Bettina Gavia BP Readings from Last 3 Encounters:  07/23/22 120/84  04/02/22 120/64  03/13/22 126/78  -Controlled -CHADSVASC: 5 -Current treatment: Amiodarone 200 mg daily in the evening Appropriate, Effective, Safe, Accessible Metoprolol 50 mg morning, noon and bedtime Appropriate, Effective, Safe, Accessible Anticoagulation:  Eliquis 5 mg bid  (PAP denied 2022) Appropriate, Effective, Safe, Query accessible -Medications previously tried: none reported -Home BP and HR readings:   March 2023: Doesn't test -Home Weight readings:   March 2023: Doesn't test -Counseled on increased risk of stroke due to Afib and benefits of anticoagulation for stroke prevention; importance of adherence to anticoagulant exactly as prescribed; Pharmacist will coordinate patient assistance application once 3% spent on medications.   -Counseled on diet and exercise extensively Recommended to continue current medication September 2022: Will renew PAP in December 2022  Heart Failure (Goal: manage symptoms and prevent exacerbations) Lab Results  Component Value Date   K 3.7 07/16/2022  -Controlled -Last ejection fraction: 60-65%  (Date: 10/21) -HF type: Diastolic -Current treatment: Torsemide 40 mg bid Appropriate, Effective, Safe, Accessible Kcl 86mq BID Appropriate, Effective, Safe, Accessible Metoprolol tartrate 50 mg tid Appropriate, Effective, Safe, Accessible Metolazone 5 mg once weekly if needed Appropriate, Effective, Safe, Accessible -Medications previously tried: furosemide  -Current home BP/HR readings:  March 2023: Doesn't test -Current dietary habits: daughter tries to provide healthy options  -Current exercise habits: limited by mobility and pain  -Educated on Benefits of medications for managing symptoms and prolonging life Importance of blood pressure control -Counseled on diet and exercise extensively Recommended to continue current medication Due for Echo August 2023 per Cardio  COPD (Goal: control symptoms and prevent exacerbations)    02/03/2022    2:40 PM  CAT Score  Total CAT Score 38  -Not ideally Controlled Pulmonary Functions Testing Results:  No results found for: "FEV1", "FVC", "FEV1FVC", "TLC", "DLCO" -No exacerbations in past year -GOLD: Class B -GOLD: Grade unknown -Current treatment  Breztri 2 puffs bid (PAP) Appropriate, Effective, Safe, Query accessible Ipratropium 3 mls every 6 hours prn Appropriate, Effective, Safe, Accessible Montelukast 10 mg daily Appropriate, Effective, Safe, Accessible Ventolin 2 puffs every 4 hours prn wheezing or shortness of breath Appropriate, Effective, Safe, Accessible Levocetirizine 531mAppropriate, Effective, Safe, Accessible -Medications previously tried: trelegy  -Patient reports consistent use of maintenance inhaler -Frequency of rescue inhaler use: weekly  -Counseled on Pro[place] inhaler technique; Benefits of consistent maintenance inhaler use September 2022: Will renew PAP in December 2022 March  2023: Hasn't been able to get [name] this year, daughter stated should get from Pap within 5-7 days. No exacerbations in past year (No  Steroids ordered) and no hospitalizations, maintain therapy as is  Patient Goals/Self-Care Activities Over the next 90 days, patient will:  - take medications as prescribed  Follow Up Plan: Telephone follow up appointment with care management team member scheduled for: Monthly for med refills  [place] Constable, Pharm.D. - [phone]      Mr. Rathert was given information about Chronic Care Management services today including:  CCM service includes personalized support from designated clinical staff supervised by his physician, including individualized plan of care and coordination with other care providers 24/7 contact phone numbers for assistance for urgent and routine care needs. Standard insurance, coinsurance, copays and deductibles apply for chronic care management only during months in which we provide at least 20 minutes of these services. Most insurances cover these services at 100%, however patients may be responsible for any copay, coinsurance and/or deductible if applicable. This service may help you avoid the need for more expensive face-to-face services. Only one practitioner may furnish and bill the service in a calendar month. The patient may stop CCM services at any time (effective at the end of the month) by phone call to the office staff.  Patient agreed to services and verbal consent obtained.   The patient verbalized understanding of instructions, educational materials, and care plan provided today and DECLINED offer to receive copy of patient instructions, educational materials, and care plan.  CCM Team f/u: Monthly for refills  Lane Hacker, [place]

## 2022-08-04 NOTE — Progress Notes (Signed)
Chronic Care Management Pharmacy Note  08/04/2022 Name:  Shawn Sullivan MRN:  [id number] DOB:  01/30/1946   Summary:  [phone] call [place]  Plan Recommendations:  Recommend ACE/ARB due to kidneys. Has Hx of hypotension so recommend low dose Will renew PAP in December Onboard to [facility]. [facility] manages medicines and packs things every 2 weeks. Would like help    Subjective: Shawn Sullivan is an 76 y.o. year old male who is a primary patient of Cox, Kirsten, MD.  The CCM team was consulted for assistance with disease management and care coordination needs.    Engaged with patient by telephone for follow up visit in response to provider referral for pharmacy case management and/or care coordination services.   Consent to Services:  The patient was given information about Chronic Care Management services, agreed to services, and gave verbal consent prior to initiation of services.  Please see initial visit note for detailed documentation.   Patient Care Team: Blane Ohara, MD as PCP - General (Family Medicine) Zettie Pho, [facility] (Pharmacist) Baldo Daub, MD as Consulting Physician (Cardiology) Marzetta Board, PA-C as Physician Assistant (Physician Assistant) Olegario Shearer, MD as Referring Physician (Dermatology) Maxie Barb, MD as Consulting Physician (Nephrology)  Recent office visits:  None   Recent consult visits:  None   Hospital visits:  None    Objective:  Lab Results  Component Value Date   CREATININE 1.37 (H) 07/16/2022   BUN 15 07/16/2022   GFRNONAA 52 (L) 12/10/2020   GFRAA 60 12/10/2020   NA 139 07/16/2022   K 3.7 07/16/2022   CALCIUM 9.4 07/16/2022   CO2 24 07/16/2022    Lab Results  Component Value Date/Time   HGBA1C 7.3 (H) 07/16/2022 10:20 AM   HGBA1C 7.7 (H) 04/02/2022 02:32 PM   MICROALBUR 80 01/14/2021 11:53 AM   MICROALBUR 150 05/31/2020 11:09 AM    Last diabetic Eye exam: No results found for:  "HMDIABEYEEXA"  Last diabetic Foot exam: No results found for: "HMDIABFOOTEX"   Lab Results  Component Value Date   CHOL 130 07/16/2022   HDL 35 (L) 07/16/2022   LDLCALC 67 07/16/2022   TRIG 164 (H) 07/16/2022   CHOLHDL 3.7 07/16/2022       Latest Ref Rng & Units 07/16/2022   10:20 AM 04/02/2022    2:32 PM 01/23/2022   11:52 AM  Hepatic Function  Total Protein 6.0 - 8.5 g/dL 6.1  6.1    Albumin 3.8 - 4.8 g/dL 3.9  3.8  4.1   AST 0 - 40 IU/L 26  25    ALT 0 - 44 IU/L 16  21    Alk Phosphatase 44 - 121 IU/L 107  102    Total Bilirubin 0.0 - 1.2 mg/dL 0.8  0.8      Lab Results  Component Value Date/Time   TSH 2.380 04/02/2022 02:32 PM   TSH 2.670 07/02/2021 11:42 AM   FREET4 1.44 07/02/2021 11:42 AM       Latest Ref Rng & Units 07/16/2022   10:20 AM 04/02/2022    2:32 PM 01/23/2022   11:52 AM  CBC  WBC 3.4 - 10.8 x10E3/uL 11.0  9.7  10.6   Hemoglobin 13.0 - 17.7 g/dL 78.7  46.2  44.2   Hematocrit 37.5 - 51.0 % 38.5  39.5  41.7   Platelets 150 - 450 x10E3/uL 176  164  208     Lab Results  Component Value Date/Time  VD25OH 29.4 (L) 01/23/2022 11:52 AM   VD25OH 29.5 (L) 07/18/2020 02:32 PM    Clinical ASCVD: No  The 10-year ASCVD risk score (Arnett DK, et al., 2019) is: 50.3%   Values used to calculate the score:     Age: 87 years     Sex: Male     Is Non-Hispanic African American: No     Diabetic: Yes     Tobacco smoker: No     Systolic Blood Pressure: 621 mmHg     Is BP treated: Yes     HDL Cholesterol: 35 mg/dL     Total Cholesterol: 130 mg/dL       07/16/2022    9:47 AM 12/24/2021   11:39 AM 09/09/2021   10:17 AM  Depression screen PHQ 2/9  Decreased Interest 0 0 0  Down, Depressed, Hopeless 0 0 0  PHQ - 2 Score 0 0 0  Altered sleeping 3  3  Tired, decreased energy 3  3  Change in appetite 3  3  Feeling bad or failure about yourself  0  0  Trouble concentrating 0  0  Moving slowly or fidgety/restless 0  0  Suicidal thoughts 0  0  PHQ-9 Score 9  9   Difficult doing work/chores Somewhat difficult  Not difficult at all     Social History   Tobacco Use  Smoking Status Former   Types: Cigarettes   Quit date: 1993   Years since quitting: 30.6  Smokeless Tobacco Never   BP Readings from Last 3 Encounters:  07/23/22 120/84  04/02/22 120/64  03/13/22 126/78   Pulse Readings from Last 3 Encounters:  07/23/22 70  04/02/22 78  03/13/22 72   Wt Readings from Last 3 Encounters:  07/23/22 (!) 317 lb (143.8 kg)  04/02/22 (!) 317 lb (143.8 kg)  03/13/22 (!) 323 lb 9.6 oz (146.8 kg)    Assessment/Interventions: Review of patient past medical history, allergies, medications, health status, including review of consultants reports, laboratory and other test data, was performed as part of comprehensive evaluation and provision of chronic care management services.   SDOH:  (Social Determinants of Health) assessments and interventions performed: Yes SDOH Interventions    Flowsheet Row Chronic Care Management from 08/04/2022 in [place] Office Visit from 07/16/2022 in [place] Management from 02/03/2022 in [place] Office Visit from 09/09/2021 in [place]  SDOH Interventions      Transportation Interventions -- -- Intervention Not Indicated --  Depression Interventions/Treatment  -- Currently on Treatment -- Currently on Treatment  Financial Strain Interventions Other (Comment) -- Other (Comment)  [PAP (See CP)] --  Physical Activity Interventions Patient Refused -- -- --       CCM Care Plan  Allergies  Allergen Reactions   Androgel [Testosterone] Other (See Comments)    Pedal edema    Biaxin [Clarithromycin] Other (See Comments)    Taste perception alteration   Duloxetine Other (See Comments)    Hallucinations   Gabapentin Other (See Comments)    hallucinations   Ibuprofen Other (See Comments)    GI upset    Lyrica [Pregabalin] Swelling   Spironolactone Other (See  Comments)    Medications Reviewed Today     Reviewed by Lane Hacker, [facility] (Pharmacist) on 08/04/22 at 1533  Med List Status: <None>   Medication Order Taking? Sig Documenting Provider Last Dose Status Informant  acetaminophen (TYLENOL) 500 MG tablet [id number] No Take 1,000 mg  by mouth every 6 (six) hours as needed for moderate pain or headache. [provider] Taking Active Family Member  amiodarone (PACERONE) 200 MG tablet [id number] No Take 1 tablet by mouth in the evening Bettina Gavia, Hilton Cork, MD Taking Active   apixaban (ELIQUIS) 5 MG TABS tablet [id number] No Take 1 tablet (5 mg total) by mouth 2 (two) times daily. Cox, Kirsten, MD Taking Active   BD PEN NEEDLE MICRO U/F 32G X 6 MM MISC [id number] No SMARTSIG:1 Needle SUB-Q Daily [provider] Taking Active   Budeson-Glycopyrrol-Formoterol (BREZTRI AEROSPHERE) 160-9-4.8 MCG/ACT AERO [id number] No Inhale 2 puffs into the lungs 2 (two) times daily. Cox, Kirsten, MD Taking Active   cholecalciferol (VITAMIN D3) 25 MCG (1000 UT) tablet [id number] No Take 1,000 Units by mouth daily.  [provider] Taking Active   dapagliflozin propanediol (FARXIGA) 10 MG TABS tablet [id number] No Take 1 tablet (10 mg total) by mouth daily before breakfast. Cox, Kirsten, MD Taking Active   folic acid (FOLVITE) 768 MCG tablet [id number] No Take 800 mcg by mouth daily. [provider] Taking Active   HYDROcodone-acetaminophen (NORCO) 10-325 MG tablet [id number] No Take 1 tablet by mouth every 4 (four) hours as needed for moderate pain. [provider] Taking Active   insulin degludec (TRESIBA FLEXTOUCH) 100 UNIT/ML FlexTouch Pen [id number] No Inject 55 Units into the skin daily. [provider] Taking Active   ipratropium-albuterol (DUONEB) 0.5-2.5 (3) MG/3ML SOLN [id number] No Take 3 mLs by nebulization every 6 (six) hours as needed (shortness of breath). Cox, Kirsten, MD Taking Active   levocetirizine (XYZAL) 5 MG  tablet [id number] No Take 5 mg by mouth daily as needed for allergies. [provider] Taking Active Family Member  metolazone (ZAROXOLYN) 5 MG tablet [id number] No Take 5 mg by mouth once a week. Uses prn if swollen once weekly. [provider] Taking Active   metoprolol tartrate (LOPRESSOR) 50 MG tablet [id number]  TAKE 1 TABLET BY MOUTH IN THE MORNING AND 1 AT NOON AND 1 AT BEDTIME Richardo Priest, MD  Active   montelukast (SINGULAIR) 10 MG tablet [id number] No TAKE 1 TABLET BY MOUTH ONCE DAILY IN THE Julio Alm, Kirsten, MD Taking Active   Multiple Vitamin (MULTIVITAMIN) tablet [id number] No Take 1 tablet by mouth daily. [provider] Taking Active   Omeprazole 20 MG TBEC [id number] No Take 20 mg by mouth 2 (two) times daily before a meal.  [provider] Taking Active Family Member  Potassium Chloride ER 20 MEQ TBCR [id number] No Take 1 tablet by mouth 2 (two) times daily. [provider] Taking Active   rOPINIRole (REQUIP) 0.5 MG tablet [id number] No TAKE 1 TABLET BY MOUTH AT BEDTIME  Patient taking differently: at bedtime as needed.   Cox, Kirsten, MD Taking Active   rosuvastatin (CRESTOR) 40 MG tablet [id number] No Take 1 tablet by mouth once daily Cox, Kirsten, MD Taking Active   Semaglutide [facility], 1 MG/DOSE, [place]) [id number] No Inject 1 mg into the skin once a week. [provider] Taking Active   sertraline (ZOLOFT) 50 MG tablet [id number] No Take 1 tablet by mouth twice daily Marge Duncans, PA-C Taking Active   tamsulosin [facility]) 0.4 MG CAPS capsule [id number] No Take 1 capsule by mouth at bedtime Marge Duncans, PA-C Taking Active   tiZANidine (ZANAFLEX) 2 MG tablet [id number]  Take 1 tablet (2 mg total) by mouth at bedtime. Rochel Brome, MD  Active   torsemide (DEMADEX) 20 MG tablet [id number]  No Take 40 mg by mouth 2 (two) times daily. [provider] Taking Active   VENTOLIN HFA 108 (90 Base) MCG/ACT inhaler [id number] No INHALE 2 PUFFS  BY MOUTH EVERY 4 HOURS AS NEEDED FOR WHEEZING FOR SHORTNESS OF Jacques Earthly, MD Taking Active             Patient Active Problem List   Diagnosis Date Noted   Acquired thrombophilia ([place]) 26/33/3545   Uncomplicated opioid dependence ([place]) 12/24/2021   Atrial fibrillation, chronic ([place]) 05/31/2020   Diabetes mellitus due to underlying condition, controlled, with diabetic polyneuropathy, with long-term current use of insulin ([place]) 02/23/2020   Gastroesophageal reflux disease without esophagitis 02/23/2020   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 stage 3b chronic kidney disease ([place]) 06/14/2019   On amiodarone therapy 03/24/2019   PAF (paroxysmal atrial fibrillation) ([place]) 07/01/2018   CKD (chronic kidney disease) stage 3, GFR 30-59 ml/min (HCC) 07/01/2018   COPD (chronic obstructive pulmonary disease) ([place]) 07/01/2018   Obstructive sleep apnea 07/01/2018   Class 3 severe obesity due to excess calories with serious comorbidity and body mass index (BMI) of 40.0 to 44.9 in adult ([place]) 10/04/2017   Chronic pain syndrome 08/30/2017   Restrictive lung disease 06/22/2016   Generalized edema 06/22/2016   Restless legs syndrome 03/23/2016   Presbyopia of both eyes 12/16/2015   Hyperlipidemia 12/16/2015   Bilateral pseudophakia 12/16/2015   Chronic obstructive pulmonary disease ([place]) 11/05/2015   Edema 11/04/2015   Piriformis syndrome 05/03/2014   Disorder of bursae and tendons in shoulder region 01/05/2014   Low back pain 08/17/2013   Sleep apnea 05/06/2013   Postlaminectomy syndrome, lumbar region 05/06/2013   Lumbosacral spondylosis 05/06/2013   Depressive disorder 05/06/2013    Immunization History  Administered Date(s) Administered   Fluad Quad(high Dose 65+) 10/11/2020, 09/09/2021   Influenza-Unspecified 08/16/2019   Moderna Covid-19 Vaccine Bivalent Booster 44yr & up 09/09/2021   Moderna SARS-COV2  Booster Vaccination 12/10/2020   Moderna Sars-Covid-2 Vaccination 12/22/2019, 01/19/2020   Pneumococcal Conjugate-13 07/26/2014   Pneumococcal Polysaccharide-23 08/19/2012    Conditions to be addressed/monitored:  Hypertension, Hyperlipidemia, Diabetes, Atrial Fibrillation, Heart Failure, GERD, COPD, Chronic Kidney Disease and Restless Leg Syndrome  Care Plan : CCM Pharmacy Care Plan  Updates made by KLane Hacker RPH since 08/04/2022 12:00 AM     Problem: dm, afib, chf, hld   Priority: High  Onset Date: 02/07/2021     Long-Range Goal: Disease Management   Start Date: 02/07/2021  Expected End Date: 02/07/2022  Recent Progress: On track  Priority: High  Note:    Current Barriers:  Unable to achieve control of diabetes   Pharmacist Clinical Goal(s):  Over the next 90 days, patient will verbalize ability to afford treatment regimen achieve adherence to monitoring guidelines and medication adherence to achieve therapeutic efficacy achieve control of diabetes as evidenced by a1c through collaboration with PharmD and provider.   Interventions: 1:1 collaboration with CRochel Brome MD regarding development and update of comprehensive plan of care as evidenced by provider attestation and co-signature Inter-disciplinary care team collaboration (see longitudinal plan of care) Comprehensive medication review performed; medication list updated in electronic medical record  Hyperlipidemia: (LDL goal < 70 Lab Results  Component Value Date   CHOL 130 07/16/2022   CHOL 134 04/02/2022   CHOL 154 12/24/2021   Lab Results  Component Value Date   HDL 35 (L) 07/16/2022   HDL 38 (L) 04/02/2022  HDL 36 (L) 12/24/2021   Lab Results  Component Value Date   LDLCALC 67 07/16/2022   LDLCALC 74 04/02/2022   LDLCALC 87 12/24/2021   Lab Results  Component Value Date   TRIG 164 (H) 07/16/2022   TRIG 120 04/02/2022   TRIG 177 (H) 12/24/2021   Lab Results  Component Value Date    CHOLHDL 3.7 07/16/2022   CHOLHDL 3.5 04/02/2022   CHOLHDL 4.3 12/24/2021  No results found for: "LDLDIRECT" -Controlled -Current treatment: Rosuvastatin 40 mg daily Appropriate, Effective, Safe, Accessible -Medications previously tried: atorvastatin  -Current dietary patterns: daughter tries to get some healthy options when she cooks for them.  -Current exercise habits: minimal due to mobility and back pain  -Educated on Cholesterol goals;  Benefits of statin for ASCVD risk reduction; Importance of limiting foods high in cholesterol; -Counseled on diet and exercise extensively Recommended to continue current medication  Diabetes (A1c goal <8%) Lab Results  Component Value Date   HGBA1C 7.3 (H) 07/16/2022   HGBA1C 7.7 (H) 04/02/2022   HGBA1C 7.5 (H) 12/24/2021   Lab Results  Component Value Date   MICROALBUR 80 01/14/2021   LDLCALC 67 07/16/2022   CREATININE 1.37 (H) 07/16/2022   Lab Results  Component Value Date   NA 139 07/16/2022   K 3.7 07/16/2022   CREATININE 1.37 (H) 07/16/2022   EGFR 53 (L) 07/16/2022   GFRNONAA 52 (L) 12/10/2020   GLUCOSE 96 07/16/2022   Lab Results  Component Value Date   WBC 11.0 (H) 07/16/2022   HGB 11.0 (L) 07/16/2022   HCT 38.5 07/16/2022   MCV 81 07/16/2022   PLT 176 07/16/2022   Wt Readings from Last 3 Encounters:  07/23/22 (!) 317 lb (143.8 kg)  04/02/22 (!) 317 lb (143.8 kg)  03/13/22 (!) 323 lb 9.6 oz (146.8 kg)  -Controlled -Current medications: Farxiga 10 mg daily (PAP) Appropriate, Effective, Safe, Accessible Tresiba 55 units daily (PAP) Appropriate, Effective, Safe, Accessible Ozempic 1g twice/week (2 mg weekly) (PAP) Appropriate, Effective, Safe, Accessible -Medications previously tried:  glipizide, metformin, Soliqua -Current home glucose readings fasting glucose:  September 2022: Daughter doesn't have logs with her March 2023: Hasn't tested this month, was 158 today fasting on the phone. Was (226, 178, 194, 167,  01/11/22: 334) last month September 2023: 145 today but didn't write down post prandial glucose: not reproted -Denies hypoglycemic/hyperglycemic symptoms -Current meal patterns:  Patient sneaks cookies and snacks. Daughter tries to prepare a balanced meal when cooking for patient. Patient's appetite fluctuates per daughter.  -Current exercise: minimal  -Educated on A1c and blood sugar goals; Complications of diabetes including kidney damage, retinal damage, and cardiovascular disease; Benefits of routine self-monitoring of blood sugar; -Counseled to check feet daily and get yearly eye exams -Counseled on diet and exercise extensively September 2022: Patient needs A1c. Daughter will call and schedule ASAP, I will also verify with front desk. Will have concierge check in monthly (Daughter affirmed plan) to get these sugars under control. Patient is a candidate for Metformin if further medication needed -Candidate for ACE/ARB due to Microalbumin: 80 March 2023: Candidate for ACE/ARB. Will have patient check sugars daily and concierge will reach out in 1 week  Atrial Fibrillation (Goal: prevent stroke and major bleeding) -Managed by Dr. Bettina Gavia BP Readings from Last 3 Encounters:  07/23/22 120/84  04/02/22 120/64  03/13/22 126/78  -Controlled -CHADSVASC: 5 -Current treatment: Amiodarone 200 mg daily in the evening Appropriate, Effective, Safe, Accessible Metoprolol 50 mg morning, noon and  bedtime Appropriate, Effective, Safe, Accessible Anticoagulation:  Eliquis 5 mg bid  (PAP denied 2022) Appropriate, Effective, Safe, Query accessible -Medications previously tried: none reported -Home BP and HR readings:   March 2023: Doesn't test -Home Weight readings:   March 2023: Doesn't test -Counseled on increased risk of stroke due to Afib and benefits of anticoagulation for stroke prevention; importance of adherence to anticoagulant exactly as prescribed; Pharmacist will coordinate patient  assistance application once 3% spent on medications.   -Counseled on diet and exercise extensively Recommended to continue current medication September 2022: Will renew PAP in December 2022  Heart Failure (Goal: manage symptoms and prevent exacerbations) Lab Results  Component Value Date   K 3.7 07/16/2022  -Controlled -Last ejection fraction: 60-65% (Date: 10/21) -HF type: Diastolic -Current treatment: Torsemide 40 mg bid Appropriate, Effective, Safe, Accessible Kcl 65mEq BID Appropriate, Effective, Safe, Accessible Metoprolol tartrate 50 mg tid Appropriate, Effective, Safe, Accessible Metolazone 5 mg once weekly if needed Appropriate, Effective, Safe, Accessible -Medications previously tried: furosemide  -Current home BP/HR readings:  March 2023: Doesn't test -Current dietary habits: daughter tries to provide healthy options  -Current exercise habits: limited by mobility and pain  -Educated on Benefits of medications for managing symptoms and prolonging life Importance of blood pressure control -Counseled on diet and exercise extensively Recommended to continue current medication Due for Echo August 2023 per Cardio  COPD (Goal: control symptoms and prevent exacerbations)    02/03/2022    2:40 PM  CAT Score  Total CAT Score 38  -Not ideally Controlled Pulmonary Functions Testing Results:  No results found for: "FEV1", "FVC", "FEV1FVC", "TLC", "DLCO" -No exacerbations in past year -GOLD: Class B -GOLD: Grade unknown -Current treatment  Breztri 2 puffs bid (PAP) Appropriate, Effective, Safe, Query accessible Ipratropium 3 mls every 6 hours prn Appropriate, Effective, Safe, Accessible Montelukast 10 mg daily Appropriate, Effective, Safe, Accessible Ventolin 2 puffs every 4 hours prn wheezing or shortness of breath Appropriate, Effective, Safe, Accessible Levocetirizine 5mg  Appropriate, Effective, Safe, Accessible -Medications previously tried: trelegy  -Patient reports  consistent use of maintenance inhaler -Frequency of rescue inhaler use: weekly  -Counseled on Proper inhaler technique; Benefits of consistent maintenance inhaler use September 2022: Will renew PAP in December 2022 March 2023: Hasn't been able to get [name] this year, daughter stated should get from Pap within 5-7 days. No exacerbations in past year (No Steroids ordered) and no hospitalizations, maintain therapy as is  Patient Goals/Self-Care Activities Over the next 90 days, patient will:  - take medications as prescribed  Follow Up Plan: Telephone follow up appointment with care management team member scheduled for: Monthly for med refills  [place] Constable, Pharm.D. - [phone]       Medication Assistance:  Koleen Distance, Tresibaobtained through [facility] and Me and NovoNordisk  medication assistance program.  Enrollment ends 11/29/2021  , will renew for 2024  Patient's preferred pharmacy is:  [facility], [place] - [address] [place] [address] Phone: [phone] Fax: [phone]  [facility] - [place], [place] - [address] [place] [address] Phone: [phone] Fax: [phone]  [facility], [place] [address] [place] [address] Phone: [phone] Fax: [phone], [place] (New Address) - [place], [place] AT Previously: [place], [place]  [place] [address] Phone: [phone] Fax: [place], [place] [address] Phone: [phone] Fax: [phone]   Uses pill box? Yes Pt endorses good compliance  We discussed: Benefits of medication synchronization, packaging and delivery as well as enhanced pharmacist  oversight with [facility]. Patient decided to: Utilize [facility] pharmacy for medication synchronization, packaging and delivery -Verbal consent obtained for [facility] enhanced pharmacy services (medication synchronization, adherence packaging, delivery coordination). A medication sync plan was created to allow patient to get all medications delivered once every 30 to 90 days per patient preference. Patient understands they have freedom to choose pharmacy and clinical pharmacist will coordinate care between all prescribers and [facility].   Care Plan and Follow Up Patient Decision:  Patient agrees to Care Plan and Follow-up.  Plan: Telephone follow up appointment with care management team member scheduled for:  prn  [place] Constable, Pharm.D. - [phone]

## 2022-08-05 ENCOUNTER — Telehealth: Payer: Self-pay

## 2022-08-05 NOTE — Chronic Care Management (AMB) (Signed)
Novo Nordisk patient assistance program notification:  120- day supply of Tresiba 100 u/ml and Novofine needles was filled on 07/14/2022 and should arrive to the office in 10-14 business days. Patient has 0  refill remaining and enrollment will expire on 10/29/2022.  The next refill for patient will be fulfilled on 09/30/2022. No futher action required, patient will be due for re-enrollment.  Pattricia Boss, [place] Pharmacist Assistant [phone]

## 2022-08-07 ENCOUNTER — Telehealth: Payer: Self-pay

## 2022-08-07 NOTE — Progress Notes (Signed)
    Chronic Care Management Pharmacy Assistant   Name: Shawn Sullivan  MRN: [id number] DOB: 1946/01/01   Reason for Encounter: Onboarding to Upstream review   I went over all medications with Shawn Sullivan and confirmed everything on the onboard form and have uploaded to step 2 for Shawn Sullivan to review.   Notes:  PAP medications: Tresbia, Ozempic, Farxiga, Breztri  Not taking: Requip 0.'5mg'$   Shawn Sullivan wants to continue getting supplements OTC and Hydrocodone at [facility]   Medications: Outpatient Encounter Medications as of 08/07/2022  Medication Sig   acetaminophen (TYLENOL) 500 MG tablet Take 1,000 mg by mouth every 6 (six) hours as needed for moderate pain or headache.   amiodarone (PACERONE) 200 MG tablet Take 1 tablet by mouth in the evening   apixaban (ELIQUIS) 5 MG TABS tablet Take 1 tablet (5 mg total) by mouth 2 (two) times daily.   BD PEN NEEDLE MICRO U/F 32G X 6 MM MISC SMARTSIG:1 Needle SUB-Q Daily   Budeson-Glycopyrrol-Formoterol (BREZTRI AEROSPHERE) 160-9-4.8 MCG/ACT AERO Inhale 2 puffs into the lungs 2 (two) times daily.   cholecalciferol (VITAMIN D3) 25 MCG (1000 UT) tablet Take 1,000 Units by mouth daily.    dapagliflozin propanediol (FARXIGA) 10 MG TABS tablet Take 1 tablet (10 mg total) by mouth daily before breakfast.   folic acid (FOLVITE) 811 MCG tablet Take 800 mcg by mouth daily.   HYDROcodone-acetaminophen (NORCO) 10-325 MG tablet Take 1 tablet by mouth every 4 (four) hours as needed for moderate pain.   insulin degludec (TRESIBA FLEXTOUCH) 100 UNIT/ML FlexTouch Pen Inject 55 Units into the skin daily.   ipratropium-albuterol (DUONEB) 0.5-2.5 (3) MG/3ML SOLN Take 3 mLs by nebulization every 6 (six) hours as needed (shortness of breath).   levocetirizine (XYZAL) 5 MG tablet Take 5 mg by mouth daily as needed for allergies.   metolazone (ZAROXOLYN) 5 MG tablet Take 5 mg by mouth once a week. Uses prn if swollen once weekly.   metoprolol tartrate (LOPRESSOR) 50 MG tablet TAKE  1 TABLET BY MOUTH IN THE MORNING AND 1 AT NOON AND 1 AT BEDTIME   montelukast (SINGULAIR) 10 MG tablet TAKE 1 TABLET BY MOUTH ONCE DAILY IN THE EVENING   Multiple Vitamin (MULTIVITAMIN) tablet Take 1 tablet by mouth daily.   Omeprazole 20 MG TBEC Take 20 mg by mouth 2 (two) times daily before a meal.    Potassium Chloride ER 20 MEQ TBCR Take 1 tablet by mouth 2 (two) times daily.   rOPINIRole (REQUIP) 0.5 MG tablet TAKE 1 TABLET BY MOUTH AT BEDTIME (Patient taking differently: at bedtime as needed.)   rosuvastatin (CRESTOR) 40 MG tablet Take 1 tablet by mouth once daily   Semaglutide (OZEMPIC, 1 MG/DOSE, [place]) Inject 1 mg into the skin once a week.   sertraline (ZOLOFT) 50 MG tablet Take 1 tablet by mouth twice daily   tamsulosin (FLOMAX) 0.4 MG CAPS capsule Take 1 capsule by mouth at bedtime   tiZANidine (ZANAFLEX) 2 MG tablet Take 1 tablet (2 mg total) by mouth at bedtime.   torsemide (DEMADEX) 20 MG tablet Take 40 mg by mouth 2 (two) times daily.   VENTOLIN HFA 108 (90 Base) MCG/ACT inhaler INHALE 2 PUFFS BY MOUTH EVERY 4 HOURS AS NEEDED FOR WHEEZING FOR SHORTNESS OF BREATH   No facility-administered encounter medications on file as of 08/07/2022.    [name], [place] Pharmacist Assistant  [phone]

## 2022-08-11 NOTE — Progress Notes (Signed)
Onboard form has been completed and uploaded to step 2 folder.  Thank you  [name], [place] Pharmacist Assistant  [phone]

## 2022-08-12 ENCOUNTER — Ambulatory Visit (INDEPENDENT_AMBULATORY_CARE_PROVIDER_SITE_OTHER): Payer: Medicare HMO

## 2022-08-12 DIAGNOSIS — Z0000 Encounter for general adult medical examination without abnormal findings: Secondary | ICD-10-CM | POA: Diagnosis not present

## 2022-08-17 ENCOUNTER — Other Ambulatory Visit: Payer: Self-pay

## 2022-08-17 MED ORDER — ROSUVASTATIN CALCIUM 40 MG PO TABS: 40.0000 mg | ORAL_TABLET | Freq: Every day | ORAL | 0 refills | Status: DC

## 2022-08-17 MED ORDER — MONTELUKAST SODIUM 10 MG PO TABS: 10.0000 mg | ORAL_TABLET | Freq: Every evening | ORAL | 1 refills | Status: DC

## 2022-08-17 MED ORDER — TORSEMIDE 20 MG PO TABS: 40.0000 mg | ORAL_TABLET | Freq: Two times a day (BID) | ORAL | 0 refills | Status: DC

## 2022-08-17 MED ORDER — VENTOLIN HFA 108 (90 BASE) MCG/ACT IN AERS: INHALATION_SPRAY | RESPIRATORY_TRACT | 3 refills | Status: DC

## 2022-08-17 MED ORDER — SERTRALINE HCL 50 MG PO TABS: 50.0000 mg | ORAL_TABLET | Freq: Two times a day (BID) | ORAL | 0 refills | Status: DC

## 2022-08-17 MED ORDER — APIXABAN 5 MG PO TABS: 5.0000 mg | ORAL_TABLET | Freq: Two times a day (BID) | ORAL | 1 refills | Status: DC

## 2022-08-17 MED ORDER — IPRATROPIUM-ALBUTEROL 0.5-2.5 (3) MG/3ML IN SOLN: 3.0000 mL | Freq: Four times a day (QID) | RESPIRATORY_TRACT | 1 refills | Status: DC | PRN

## 2022-08-20 NOTE — Patient Instructions (Signed)
Shawn Sullivan , Thank you for taking time to come for your Medicare Wellness Visit. I appreciate your ongoing commitment to your health goals. Please review the following plan we discussed and let me know if I can assist you in the future.   Health Maintenance  Topic Date Due   OPHTHALMOLOGY EXAM  Due   TETANUS/TDAP  Due   Zoster Vaccines- Shingrix (1 of 2) Due   COVID-19 Vaccine (4 - Moderna risk series) 11/04/2021   INFLUENZA VACCINE  06/30/2022   Diabetic kidney evaluation - Urine ACR  12/24/2022   FOOT EXAM  12/24/2022   HEMOGLOBIN A1C  01/16/2023   Diabetic kidney evaluation - GFR measurement  07/17/2023   Pneumonia Vaccine 54+ Years old  Completed     Preventive Care 38 Years and Older, Male Preventive care refers to lifestyle choices and visits with your health care provider that can promote health and wellness. What does preventive care include? A yearly physical exam. This is also called an annual well check. Dental exams once or twice a year. Routine eye exams. Ask your health care provider how often you should have your eyes checked. Personal lifestyle choices, including: Daily care of your teeth and gums. Regular physical activity. Eating a healthy diet. Avoiding tobacco and drug use. Limiting alcohol use. Practicing safe sex. Taking low doses of aspirin every day. Taking vitamin and mineral supplements as recommended by your health care provider. What happens during an annual well check? The services and screenings done by your health care provider during your annual well check will depend on your age, overall health, lifestyle risk factors, and family history of disease. Counseling  Your health care provider may ask you questions about your: Alcohol use. Tobacco use. Drug use. Emotional well-being. Home and relationship well-being. Sexual activity. Eating habits. History of falls. Memory and ability to understand (cognition). Work and work  Statistician. Screening  You may have the following tests or measurements: Height, weight, and BMI. Blood pressure. Lipid and cholesterol levels. These may be checked every 5 years, or more frequently if you are over 71 years old. Skin check. Lung cancer screening. You may have this screening every year starting at age 34 if you have a 30-pack-year history of smoking and currently smoke or have quit within the past 15 years. Fecal occult blood test (FOBT) of the stool. You may have this test every year starting at age 78. Flexible sigmoidoscopy or colonoscopy. You may have a sigmoidoscopy every 5 years or a colonoscopy every 10 years starting at age 24. Prostate cancer screening. Recommendations will vary depending on your family history and other risks. Hepatitis C blood test. Hepatitis B blood test. Sexually transmitted disease (STD) testing. Diabetes screening. This is done by checking your blood sugar (glucose) after you have not eaten for a while (fasting). You may have this done every 1-3 years. Abdominal aortic aneurysm (AAA) screening. You may need this if you are a current or former smoker. Osteoporosis. You may be screened starting at age 81 if you are at high risk. Talk with your health care provider about your test results, treatment options, and if necessary, the need for more tests. Vaccines  Your health care provider may recommend certain vaccines, such as: Influenza vaccine. This is recommended every year. Tetanus, diphtheria, and acellular pertussis (Tdap, Td) vaccine. You may need a Td booster every 10 years. Zoster vaccine. You may need this after age 63. Pneumococcal 13-valent conjugate (PCV13) vaccine. One dose is recommended after  age 76. Pneumococcal polysaccharide (PPSV23) vaccine. One dose is recommended after age 51. Talk to your health care provider about which screenings and vaccines you need and how often you need them. This information is not intended to replace  advice given to you by your health care provider. Make sure you discuss any questions you have with your health care provider. Document Released: 12/13/2015 Document Revised: 08/05/2016 Document Reviewed: 09/17/2015 Elsevier Interactive Patient Education  2017 [employer] Prevention in the Home Falls can cause injuries. They can happen to people of all ages. There are many things you can do to make your home safe and to help prevent falls. What can I do on the outside of my home? Regularly fix the edges of walkways and driveways and fix any cracks. Remove anything that might make you trip as you walk through a door, such as a raised step or threshold. Trim any bushes or trees on the path to your home. Use bright outdoor lighting. Clear any walking paths of anything that might make someone trip, such as rocks or tools. Regularly check to see if handrails are loose or broken. Make sure that both sides of any steps have handrails. Any raised decks and porches should have guardrails on the edges. Have any leaves, snow, or ice cleared regularly. Use sand or salt on walking paths during winter. Clean up any spills in your garage right away. This includes oil or grease spills. What can I do in the bathroom? Use night lights. Install grab bars by the toilet and in the tub and shower. Do not use towel bars as grab bars. Use non-skid mats or decals in the tub or shower. If you need to sit down in the shower, use a plastic, non-slip stool. Keep the floor dry. Clean up any water that spills on the floor as soon as it happens. Remove soap buildup in the tub or shower regularly. Attach bath mats securely with double-sided non-slip rug tape. Do not have throw rugs and other things on the floor that can make you trip. What can I do in the bedroom? Use night lights. Make sure that you have a light by your bed that is easy to reach. Do not use any sheets or blankets that are too big for your bed.  They should not hang down onto the floor. Have a firm chair that has side arms. You can use this for support while you get dressed. Do not have throw rugs and other things on the floor that can make you trip. What can I do in the kitchen? Clean up any spills right away. Avoid walking on wet floors. Keep items that you use a lot in easy-to-reach places. If you need to reach something above you, use a strong step stool that has a grab bar. Keep electrical cords out of the way. Do not use floor polish or wax that makes floors slippery. If you must use wax, use non-skid floor wax. Do not have throw rugs and other things on the floor that can make you trip. What can I do with my stairs? Do not leave any items on the stairs. Make sure that there are handrails on both sides of the stairs and use them. Fix handrails that are broken or loose. Make sure that handrails are as long as the stairways. Check any carpeting to make sure that it is firmly attached to the stairs. Fix any carpet that is loose or worn. Avoid having throw rugs  at the top or bottom of the stairs. If you do have throw rugs, attach them to the floor with carpet tape. Make sure that you have a light switch at the top of the stairs and the bottom of the stairs. If you do not have them, ask someone to add them for you. What else can I do to help prevent falls? Wear shoes that: Do not have high heels. Have rubber bottoms. Are comfortable and fit you well. Are closed at the toe. Do not wear sandals. If you use a stepladder: Make sure that it is fully opened. Do not climb a closed stepladder. Make sure that both sides of the stepladder are locked into place. Ask someone to hold it for you, if possible. Clearly mark and make sure that you can see: Any grab bars or handrails. First and last steps. Where the edge of each step is. Use tools that help you move around (mobility aids) if they are needed. These  include: Canes. Walkers. Scooters. Crutches. Turn on the lights when you go into a dark area. Replace any light bulbs as soon as they burn out. Set up your furniture so you have a clear path. Avoid moving your furniture around. If any of your floors are uneven, fix them. If there are any pets around you, be aware of where they are. Review your medicines with your doctor. Some medicines can make you feel dizzy. This can increase your chance of falling. Ask your doctor what other things that you can do to help prevent falls. This information is not intended to replace advice given to you by your health care provider. Make sure you discuss any questions you have with your health care provider. Document Released: 09/12/2009 Document Revised: 04/23/2016 Document Reviewed: 12/21/2014 Elsevier Interactive Patient Education  2017 [employer].

## 2022-08-20 NOTE — Progress Notes (Signed)
Subjective:   Shawn Sullivan is a 76 y.o. male who presents for Medicare Annual/Subsequent preventive examination. I connected with  [name] and his wife, Hassan Rowan on 08/20/22 by a audio enabled telemedicine application and verified that I am speaking with the correct person using two identifiers.  Patient Location: Home  Provider Location: Office/Clinic  I discussed the limitations of evaluation and management by telemedicine. The patient expressed understanding and agreed to proceed.  With [place]'s permission, Hassan Rowan helped answer most questions.   Cardiac Risk Factors include: advanced age (>29mn, >>88women);diabetes mellitus;dyslipidemia;hypertension;male gender;obesity (BMI >30kg/m2)     Objective:    There were no vitals filed for this visit. There is no height or weight on file to calculate BMI.     10/18/2020   10:12 AM 12/01/2018   12:28 PM 04/13/2017   10:40 AM  Advanced Directives  Does Patient Have a Medical Advance Directive? No No No  Would patient like information on creating a medical advance directive?  No - Patient declined No - Patient declined    Current Medications (verified) Outpatient Encounter Medications as of 08/12/2022  Medication Sig   acetaminophen (TYLENOL) 500 MG tablet Take 1,000 mg by mouth every 6 (six) hours as needed for moderate pain or headache.   amiodarone (PACERONE) 200 MG tablet Take 1 tablet by mouth in the evening   apixaban (ELIQUIS) 5 MG TABS tablet Take 1 tablet (5 mg total) by mouth 2 (two) times daily.   BD PEN NEEDLE MICRO U/F 32G X 6 MM MISC SMARTSIG:1 Needle SUB-Q Daily   Budeson-Glycopyrrol-Formoterol (BREZTRI AEROSPHERE) 160-9-4.8 MCG/ACT AERO Inhale 2 puffs into the lungs 2 (two) times daily.   cholecalciferol (VITAMIN D3) 25 MCG (1000 UT) tablet Take 1,000 Units by mouth daily.    dapagliflozin propanediol (FARXIGA) 10 MG TABS tablet Take 1 tablet (10 mg total) by mouth daily before breakfast.   folic acid  (FOLVITE) 8712MCG tablet Take 800 mcg by mouth daily.   HYDROcodone-acetaminophen (NORCO) 10-325 MG tablet Take 1 tablet by mouth every 4 (four) hours as needed for moderate pain.   insulin degludec (TRESIBA FLEXTOUCH) 100 UNIT/ML FlexTouch Pen Inject 55 Units into the skin daily.   ipratropium-albuterol (DUONEB) 0.5-2.5 (3) MG/3ML SOLN Take 3 mLs by nebulization every 6 (six) hours as needed (shortness of breath).   levocetirizine (XYZAL) 5 MG tablet Take 5 mg by mouth daily as needed for allergies.   metolazone (ZAROXOLYN) 5 MG tablet Take 5 mg by mouth once a week. Uses prn if swollen once weekly.   metoprolol tartrate (LOPRESSOR) 50 MG tablet TAKE 1 TABLET BY MOUTH IN THE MORNING AND 1 AT NOON AND 1 AT BEDTIME   montelukast (SINGULAIR) 10 MG tablet Take 1 tablet (10 mg total) by mouth every evening.   Multiple Vitamin (MULTIVITAMIN) tablet Take 1 tablet by mouth daily.   Omeprazole 20 MG TBEC Take 20 mg by mouth 2 (two) times daily before a meal.    Potassium Chloride ER 20 MEQ TBCR Take 1 tablet by mouth 2 (two) times daily.   rOPINIRole (REQUIP) 0.5 MG tablet TAKE 1 TABLET BY MOUTH AT BEDTIME (Patient taking differently: at bedtime as needed.)   rosuvastatin (CRESTOR) 40 MG tablet Take 1 tablet (40 mg total) by mouth daily.   Semaglutide (OZEMPIC, 1 MG/DOSE, [place]) Inject 1 mg into the skin once a week.   sertraline (ZOLOFT) 50 MG tablet Take 1 tablet (50 mg total) by mouth 2 (two) times daily.  tamsulosin (FLOMAX) 0.4 MG CAPS capsule Take 1 capsule by mouth at bedtime   tiZANidine (ZANAFLEX) 2 MG tablet Take 1 tablet (2 mg total) by mouth at bedtime.   torsemide (DEMADEX) 20 MG tablet Take 2 tablets (40 mg total) by mouth 2 (two) times daily.   VENTOLIN HFA 108 (90 Base) MCG/ACT inhaler INHALE 2 PUFFS BY MOUTH EVERY 4 HOURS AS NEEDED FOR WHEEZING FOR SHORTNESS OF BREATH   [DISCONTINUED] apixaban (ELIQUIS) 5 MG TABS tablet Take 1 tablet (5 mg total) by mouth 2 (two) times daily.    [DISCONTINUED] ipratropium-albuterol (DUONEB) 0.5-2.5 (3) MG/3ML SOLN Take 3 mLs by nebulization every 6 (six) hours as needed (shortness of breath).   [DISCONTINUED] montelukast (SINGULAIR) 10 MG tablet TAKE 1 TABLET BY MOUTH ONCE DAILY IN THE EVENING   [DISCONTINUED] rosuvastatin (CRESTOR) 40 MG tablet Take 1 tablet by mouth once daily   [DISCONTINUED] sertraline (ZOLOFT) 50 MG tablet Take 1 tablet by mouth twice daily   [DISCONTINUED] torsemide (DEMADEX) 20 MG tablet Take 40 mg by mouth 2 (two) times daily.   [DISCONTINUED] VENTOLIN HFA 108 (90 Base) MCG/ACT inhaler INHALE 2 PUFFS BY MOUTH EVERY 4 HOURS AS NEEDED FOR WHEEZING FOR SHORTNESS OF BREATH   No facility-administered encounter medications on file as of 08/12/2022.    Allergies (verified) Androgel [testosterone], Biaxin [clarithromycin], Duloxetine, Gabapentin, Ibuprofen, Lyrica [pregabalin], and Spironolactone   History: Past Medical History:  Diagnosis Date   Anticoagulated 07/01/2018   Anticoagulated 07/01/2018   Benign essential hypertension 05/06/2013   Chronic obstructive pulmonary disease ([place]) 11/05/2015   COPD (chronic obstructive pulmonary disease) ([place]) 07/01/2018   Dermatochalasis 12/16/2015   Disorder of bursae and tendons in shoulder region 01/05/2014   Generalized edema 06/22/2016   Hyperlipidemia 12/16/2015   Hypertensive heart disease 07/01/2018   Low back pain 08/17/2013   Lumbosacral spondylosis 05/06/2013   Obstructive sleep apnea 07/01/2018   Persistent atrial fibrillation ([place]) 07/01/2018   Piriformis syndrome 05/03/2014   Postlaminectomy syndrome, lumbar region 05/06/2013   Presbyopia of both eyes 12/16/2015   Restless legs syndrome 03/23/2016   Restrictive lung disease 06/22/2016   Sleep apnea 05/06/2013   Type 2 diabetes mellitus without complications ([place]) 37/11/6965   Past Surgical History:  Procedure Laterality Date   APPENDECTOMY     BACK SURGERY     CARDIOVERSION N/A 12/01/2018   Procedure: CARDIOVERSION;  Surgeon:  Sanda Klein, MD;  Location: [facility];  Service: Cardiovascular;  Laterality: N/A;   CATARACT EXTRACTION Bilateral    LUMBAR FUSION     NECK SURGERY     Family History  Problem Relation Age of Onset   Atrial fibrillation Mother    Cancer Mother    Heart disease Father    Social History   Socioeconomic History   Marital status: Married    Spouse name: Not on file   Number of children: Not on file   Years of education: Not on file   Highest education level: Not on file  Occupational History   Not on file  Tobacco Use   Smoking status: Former    Types: Cigarettes    Quit date: 1993    Years since quitting: 30.7   Smokeless tobacco: Never  Vaping Use   Vaping Use: Never used  Substance and Sexual Activity   Alcohol use: Not Currently   Drug use: Not Currently    Types: Hydrocodone   Sexual activity: Not on file  Other Topics Concern   Not on file  Social  History Narrative   Not on file   Social Determinants of Health   Financial Resource Strain: High Risk (08/04/2022)   Overall Financial Resource Strain (CARDIA)    Difficulty of Paying Living Expenses: Very hard  Food Insecurity: No Food Insecurity (07/09/2020)   Hunger Vital Sign    Worried About Running Out of Food in the Last Year: Never true    Ran Out of Food in the Last Year: Never true  Transportation Needs: No Transportation Needs (02/03/2022)   PRAPARE - Hydrologist (Medical): No    Lack of Transportation (Non-Medical): No  Physical Activity: Inactive (08/04/2022)   Exercise Vital Sign    Days of Exercise per Week: 0 days    Minutes of Exercise per Session: 0 min  Stress: Not on file  Social Connections: Not on file    Tobacco Counseling Counseling given: Not Answered   Clinical Intake:  Pre-visit preparation completed: Yes  Pain : No/denies pain     BMI - recorded: 44.21 Nutritional Status: BMI > 30  Obese Nutritional Risks: None Diabetes: Yes (most recent  A1C 7.3)  How often do you need to have someone help you when you read instructions, pamphlets, or other written materials from your doctor or pharmacy?: 3 - Sometimes Interpreter Needed?: No    Activities of Daily Living    08/20/2022   12:02 PM  In your present state of health, do you have any difficulty performing the following activities:  Hearing? 0  Vision? 0  Difficulty concentrating or making decisions? 0  Walking or climbing stairs? 1  Dressing or bathing? 0  Doing errands, shopping? 1  Preparing Food and eating ? N  Using the Toilet? N  In the past six months, have you accidently leaked urine? Y  Do you have problems with loss of bowel control? N  Managing your Medications? Y  Managing your Finances? N  Housekeeping or managing your Housekeeping? Y    Patient Care Team: Rochel Brome, MD as PCP - General (Family Medicine) Lane Hacker, [facility] (Pharmacist) Richardo Priest, MD as Consulting Physician (Cardiology) Rae Mar as Physician Assistant (Physician Assistant) Jolene Schimke, MD as Referring Physician (Dermatology) Rosita Fire, MD as Consulting Physician (Nephrology)  Indicate any recent Medical Services you may have received from other than [facility] providers in the past year (date may be approximate).     Assessment:   This is a routine wellness examination for Kamarie.  Hearing/Vision screen No results found.  Dietary issues and exercise activities discussed: Current Exercise Habits: The patient does not participate in regular exercise at present   Depression Screen    08/20/2022   12:01 PM 07/16/2022    9:47 AM 12/24/2021   11:39 AM 09/09/2021   10:17 AM 01/14/2021   11:05 AM 01/14/2018   12:30 PM 07/15/2017   11:00 AM  PHQ 2/9 Scores  PHQ - 2 Score 0 0 0 0 1 0 0  PHQ- 9 Score '9 9  9 14      '$ Fall Risk    08/20/2022   12:01 PM 07/16/2022    9:50 AM 04/02/2022   11:23 AM 12/24/2021   11:39 AM 09/09/2021   10:17 AM  Fall Risk    Falls in the past year? '1 1 1 '$ 0 1  Number falls in past yr: '1 1 1 '$ 0 1  Injury with Fall? 0 0 1 0 0  Risk for fall due to :  Orthopedic patient;Impaired balance/gait History of fall(s);Orthopedic patient   Impaired balance/gait;Impaired mobility  Follow up Falls evaluation completed;Falls prevention discussed Falls evaluation completed;Education provided Falls evaluation completed Falls evaluation completed Falls evaluation completed    FALL RISK PREVENTION PERTAINING TO THE HOME:  Any stairs in or around the home? No  If so, are there any without handrails? No  Home free of loose throw rugs in walkways, pet beds, electrical cords, etc? Yes  Adequate lighting in your home to reduce risk of falls? Yes   ASSISTIVE DEVICES UTILIZED TO PREVENT FALLS:  Use of a cane, walker or w/c? Yes  Grab bars in the bathroom? No  Shower chair or bench in shower? No  Elevated toilet seat or a handicapped toilet? No         Immunizations Immunization History  Administered Date(s) Administered   Fluad Quad(high Dose 65+) 10/11/2020, 09/09/2021   Influenza-Unspecified 08/16/2019   Moderna Covid-19 Vaccine Bivalent Booster 15yr & up 09/09/2021   Moderna SARS-COV2 Booster Vaccination 12/10/2020   Moderna Sars-Covid-2 Vaccination 12/22/2019, 01/19/2020   Pneumococcal Conjugate-13 07/26/2014   Pneumococcal Polysaccharide-23 08/19/2012    TDAP status: Due, Education has been provided regarding the importance of this vaccine. Advised may receive this vaccine at local pharmacy or Health Dept. Aware to provide a copy of the vaccination record if obtained from local pharmacy or Health Dept. Verbalized acceptance and understanding.  Flu Vaccine status: Due, Education has been provided regarding the importance of this vaccine. Advised may receive this vaccine at local pharmacy or Health Dept. Aware to provide a copy of the vaccination record if obtained from local pharmacy or Health Dept. Verbalized  acceptance and understanding.  Pneumococcal vaccine status: Up to date  Covid-19 vaccine status: Declined, Education has been provided regarding the importance of this vaccine but patient still declined. Advised may receive this vaccine at local pharmacy or Health Dept.or vaccine clinic. Aware to provide a copy of the vaccination record if obtained from local pharmacy or Health Dept. Verbalized acceptance and understanding.  Qualifies for Shingles Vaccine? Yes   Zostavax completed No   Shingrix Completed?: No.    Education has been provided regarding the importance of this vaccine. Patient has been advised to call insurance company to determine out of pocket expense if they have not yet received this vaccine. Advised may also receive vaccine at local pharmacy or Health Dept. Verbalized acceptance and understanding.  Screening Tests Health Maintenance  Topic Date Due   OPHTHALMOLOGY EXAM  Never done   TETANUS/TDAP  Never done   Zoster Vaccines- Shingrix (1 of 2) Never done   COVID-19 Vaccine (4 - Moderna risk series) 11/04/2021   INFLUENZA VACCINE  06/30/2022   Diabetic kidney evaluation - Urine ACR  12/24/2022   FOOT EXAM  12/24/2022   HEMOGLOBIN A1C  01/16/2023   Diabetic kidney evaluation - GFR measurement  07/17/2023   Pneumonia Vaccine 76 Years old  Completed   Hepatitis C Screening  Completed   HPV VACCINES  Aged Out    Health Maintenance  Health Maintenance Due  Topic Date Due   OPHTHALMOLOGY EXAM  Never done   TETANUS/TDAP  Never done   Zoster Vaccines- Shingrix (1 of 2) Never done   COVID-19 Vaccine (4 - Moderna risk series) 11/04/2021   INFLUENZA VACCINE  06/30/2022    Colorectal cancer screening: No longer required.   Additional Screening:  Vision Screening: Recommended annual ophthalmology exams for early detection of glaucoma and other disorders of the eye. Is  the patient up to date with their annual eye exam?  No   Dental Screening: Recommended annual dental  exams for proper oral hygiene  Community Resource Referral / Chronic Care Management: CRR required this visit?  No   CCM required this visit?  No      Plan:    Shingles and Tetanus vaccines are due - can get at the pharmacy Flu Vaccine due Diabetic Eye Exam due  I have personally reviewed and noted the following in the patient's chart:   Medical and social history Use of alcohol, tobacco or illicit drugs  Current medications and supplements including opioid prescriptions.  Functional ability and status Nutritional status Physical activity Advanced directives List of other physicians Hospitalizations, surgeries, and ER visits in previous 12 months Vitals Screenings to include cognitive, depression, and falls Referrals and appointments  In addition, I have reviewed and discussed with patient certain preventive protocols, quality metrics, and best practice recommendations. A written personalized care plan for preventive services as well as general preventive health recommendations were provided to patient.     Erie Noe, LPN   6/76/1952

## 2022-08-21 NOTE — Chronic Care Management (AMB) (Signed)
Messaged Dr. Joya Gaskins office and requested scripts to be sent to [facility] for the following: Amiodarone '200mg'$ , Metoprolol '50mg'$   Messaged Dr. Tobie Poet staff to send in Flomax 0.'4mg'$   Messaged Dr. Carolin Sicks office for scripts of Metolazone '5mg'$  and Potassium Chloride 68mq    DElray Mcgregor CLondonPharmacist Assistant  [phone]

## 2022-08-24 ENCOUNTER — Telehealth: Payer: Self-pay

## 2022-08-24 ENCOUNTER — Other Ambulatory Visit: Payer: Self-pay

## 2022-08-24 DIAGNOSIS — G894 Chronic pain syndrome: Secondary | ICD-10-CM | POA: Diagnosis not present

## 2022-08-24 DIAGNOSIS — M961 Postlaminectomy syndrome, not elsewhere classified: Secondary | ICD-10-CM | POA: Diagnosis not present

## 2022-08-24 DIAGNOSIS — M533 Sacrococcygeal disorders, not elsewhere classified: Secondary | ICD-10-CM | POA: Diagnosis not present

## 2022-08-24 MED ORDER — METOPROLOL TARTRATE 50 MG PO TABS: ORAL_TABLET | ORAL | 3 refills | Status: DC

## 2022-08-24 MED ORDER — AMIODARONE HCL 200 MG PO TABS: 200.0000 mg | ORAL_TABLET | Freq: Every evening | ORAL | 3 refills | Status: DC

## 2022-08-24 MED ORDER — TAMSULOSIN HCL 0.4 MG PO CAPS: 0.4000 mg | ORAL_CAPSULE | Freq: Every day | ORAL | 1 refills | Status: DC

## 2022-08-24 NOTE — Telephone Encounter (Signed)
Received this message below from the clinical pharmacist at [facility]:  "Patient is on boarding with [facility] in [place] and they need scripts sent in for him for the following:   Amiodarone '200mg'$ , Metoprolol '50mg'$    If you can send these in so pt can start receiving their medication deliveries"  Patients medications were [facility] as requested.

## 2022-08-24 NOTE — Chronic Care Management (AMB) (Signed)
The pharmacy messaged:   " tried to call him this morning to see if we can deliver Eliquis and Montelukast, he is in the coverage gap so the copay on 60 days of Eliquis is $386.60. I had to LVM, I would definitely want to confirm w/ him before trying to deliver with such a high copay"  I called Cindy on 08/24/22 and she stated she is going to reach out to Dr. Bettina Gavia office because the pt was receiving PAP through them and has to submit a Out of pocket expense report to the office so this will be covered. Jenny Reichmann wants to hold off right now on sending the Eliquis and will let us know.  [name], [place] Pharmacist Assistant  [phone]

## 2022-08-29 DIAGNOSIS — I5030 Unspecified diastolic (congestive) heart failure: Secondary | ICD-10-CM

## 2022-08-29 DIAGNOSIS — E1159 Type 2 diabetes mellitus with other circulatory complications: Secondary | ICD-10-CM | POA: Diagnosis not present

## 2022-08-29 DIAGNOSIS — I4891 Unspecified atrial fibrillation: Secondary | ICD-10-CM | POA: Diagnosis not present

## 2022-08-29 DIAGNOSIS — Z794 Long term (current) use of insulin: Secondary | ICD-10-CM

## 2022-08-29 DIAGNOSIS — J449 Chronic obstructive pulmonary disease, unspecified: Secondary | ICD-10-CM

## 2022-08-29 DIAGNOSIS — E785 Hyperlipidemia, unspecified: Secondary | ICD-10-CM | POA: Diagnosis not present

## 2022-08-31 ENCOUNTER — Telehealth: Payer: Self-pay

## 2022-08-31 NOTE — Progress Notes (Signed)
Chronic Care Management Pharmacy Assistant   Name: Shawn Sullivan  MRN: [id number] DOB: Mar 30, 1946   Reason for Encounter: Disease State call for DM    Recent office visits:  08/12/22 Shawn Hammock LPN. Medicare Annual Wellness. No med changes.  Recent consult visits:  None  Hospital visits:  None  Medications: Outpatient Encounter Medications as of 08/31/2022  Medication Sig   acetaminophen (TYLENOL) 500 MG tablet Take 1,000 mg by mouth every 6 (six) hours as needed for moderate pain or headache.   amiodarone (PACERONE) 200 MG tablet Take 1 tablet (200 mg total) by mouth every evening.   apixaban (ELIQUIS) 5 MG TABS tablet Take 1 tablet (5 mg total) by mouth 2 (two) times daily.   BD PEN NEEDLE MICRO U/F 32G X 6 MM MISC SMARTSIG:1 Needle SUB-Q Daily   Budeson-Glycopyrrol-Formoterol (BREZTRI AEROSPHERE) 160-9-4.8 MCG/ACT AERO Inhale 2 puffs into the lungs 2 (two) times daily.   cholecalciferol (VITAMIN D3) 25 MCG (1000 UT) tablet Take 1,000 Units by mouth daily.    dapagliflozin propanediol (FARXIGA) 10 MG TABS tablet Take 1 tablet (10 mg total) by mouth daily before breakfast.   folic acid (FOLVITE) 546 MCG tablet Take 800 mcg by mouth daily.   HYDROcodone-acetaminophen (NORCO) 10-325 MG tablet Take 1 tablet by mouth every 4 (four) hours as needed for moderate pain.   insulin degludec (TRESIBA FLEXTOUCH) 100 UNIT/ML FlexTouch Pen Inject 55 Units into the skin daily.   ipratropium-albuterol (DUONEB) 0.5-2.5 (3) MG/3ML SOLN Take 3 mLs by nebulization every 6 (six) hours as needed (shortness of breath).   levocetirizine (XYZAL) 5 MG tablet Take 5 mg by mouth daily as needed for allergies.   metolazone (ZAROXOLYN) 5 MG tablet Take 5 mg by mouth once a week. Uses prn if swollen once weekly.   metoprolol tartrate (LOPRESSOR) 50 MG tablet TAKE 1 TABLET BY MOUTH IN THE MORNING AND 1 AT NOON AND 1 AT BEDTIME   montelukast (SINGULAIR) 10 MG tablet Take 1 tablet (10 mg total) by mouth  every evening.   Multiple Vitamin (MULTIVITAMIN) tablet Take 1 tablet by mouth daily.   Omeprazole 20 MG TBEC Take 20 mg by mouth 2 (two) times daily before a meal.    Potassium Chloride ER 20 MEQ TBCR Take 1 tablet by mouth 2 (two) times daily.   rOPINIRole (REQUIP) 0.5 MG tablet TAKE 1 TABLET BY MOUTH AT BEDTIME (Patient taking differently: at bedtime as needed.)   rosuvastatin (CRESTOR) 40 MG tablet Take 1 tablet (40 mg total) by mouth daily.   Semaglutide (OZEMPIC, 1 MG/DOSE, [place]) Inject 1 mg into the skin once a week.   sertraline (ZOLOFT) 50 MG tablet Take 1 tablet (50 mg total) by mouth 2 (two) times daily.   tamsulosin (FLOMAX) 0.4 MG CAPS capsule Take 1 capsule (0.4 mg total) by mouth at bedtime.   tiZANidine (ZANAFLEX) 2 MG tablet Take 1 tablet (2 mg total) by mouth at bedtime.   torsemide (DEMADEX) 20 MG tablet Take 2 tablets (40 mg total) by mouth 2 (two) times daily.   VENTOLIN HFA 108 (90 Base) MCG/ACT inhaler INHALE 2 PUFFS BY MOUTH EVERY 4 HOURS AS NEEDED FOR WHEEZING FOR SHORTNESS OF BREATH   No facility-administered encounter medications on file as of 08/31/2022.    Recent Relevant Labs: Lab Results  Component Value Date/Time   HGBA1C 7.3 (H) 07/16/2022 10:20 AM   HGBA1C 7.7 (H) 04/02/2022 02:32 PM   MICROALBUR 80 01/14/2021 11:53 AM   MICROALBUR  150 05/31/2020 11:09 AM    Kidney Function Lab Results  Component Value Date/Time   CREATININE 1.37 (H) 07/16/2022 10:20 AM   CREATININE 1.54 (H) 04/02/2022 02:32 PM   GFRNONAA 52 (L) 12/10/2020 01:42 PM   GFRAA 60 12/10/2020 01:42 PM     Current antihyperglycemic regimen:  Farxiga '10mg'$  daily  Tresiba Flextouch Inject 55 units daily  Semaglutide Inject 1 mg once a week   Adherence Review: Is the patient currently on a STATIN medication? Yes Is the patient currently on ACE/ARB medication? No Does the patient have >5 day gap between last estimated fill dates? CPP to review  Care Gaps: Last eye exam / Retinopathy  Screening? Never done  Last Annual Wellness Visit? 08/12/22 Last Diabetic Foot Exam? 12/24/21   Star Rating Drugs:  Medication:  Last Fill: Day Supply Farxiga  (PAP) Semaglutide (PAP)   Shawn Sullivan, [place] Clinical Pharmacist Assistant  [phone]   Unable to reach pt after several attempts

## 2022-09-01 ENCOUNTER — Other Ambulatory Visit: Payer: Self-pay

## 2022-09-01 ENCOUNTER — Telehealth: Payer: Self-pay

## 2022-09-01 MED ORDER — DAPAGLIFLOZIN PROPANEDIOL 10 MG PO TABS: 10.0000 mg | ORAL_TABLET | Freq: Every day | ORAL | 3 refills | Status: DC

## 2022-09-01 NOTE — Chronic Care Management (AMB) (Signed)
AZ&Me Notification:  Refill request sent to clinical team for refill of Farxiga 10 mg to [facility], patient receives through [employer] patient assistance program.   Pattricia Boss, [place] Pharmacist Assistant [phone]

## 2022-09-07 DIAGNOSIS — E876 Hypokalemia: Secondary | ICD-10-CM | POA: Diagnosis not present

## 2022-09-07 DIAGNOSIS — N2581 Secondary hyperparathyroidism of renal origin: Secondary | ICD-10-CM | POA: Diagnosis not present

## 2022-09-07 DIAGNOSIS — D631 Anemia in chronic kidney disease: Secondary | ICD-10-CM | POA: Diagnosis not present

## 2022-09-07 DIAGNOSIS — N1832 Chronic kidney disease, stage 3b: Secondary | ICD-10-CM | POA: Diagnosis not present

## 2022-09-07 DIAGNOSIS — E1122 Type 2 diabetes mellitus with diabetic chronic kidney disease: Secondary | ICD-10-CM | POA: Diagnosis not present

## 2022-09-07 DIAGNOSIS — I129 Hypertensive chronic kidney disease with stage 1 through stage 4 chronic kidney disease, or unspecified chronic kidney disease: Secondary | ICD-10-CM | POA: Diagnosis not present

## 2022-09-07 DIAGNOSIS — R600 Localized edema: Secondary | ICD-10-CM | POA: Diagnosis not present

## 2022-09-11 ENCOUNTER — Telehealth: Payer: Self-pay

## 2022-09-11 NOTE — Chronic Care Management (AMB) (Signed)
Patient's daughter [name] called requesting phone number to [employer] patient assistance program for patient Eliquis. Number given [phone] to [place] patient assistance foundation.  Pattricia Boss, [place] Pharmacist Assistant [phone]

## 2022-09-17 ENCOUNTER — Telehealth: Payer: Self-pay

## 2022-09-17 ENCOUNTER — Other Ambulatory Visit: Payer: Self-pay

## 2022-09-17 MED ORDER — BREZTRI AEROSPHERE 160-9-4.8 MCG/ACT IN AERO: 2.0000 | INHALATION_SPRAY | Freq: Two times a day (BID) | RESPIRATORY_TRACT | 3 refills | Status: DC

## 2022-09-17 NOTE — Chronic Care Management (AMB) (Signed)
PAP refill request:  Sent task to clinical team requesting a 90 day supply of Breztri to be sent to [facility], patient receives medication through [place] patient assistance program.   AZ&ME Notification:  Patient approved for 2024 Enrollment with [employer] Patient Assistance Program for Farxiga 10 mg and Breztri 160/9/4.8 mcg, enrollment will end on November 30, 2023.    Pattricia Boss, [place] Pharmacist Assistant [phone]

## 2022-09-18 ENCOUNTER — Telehealth: Payer: Self-pay | Admitting: Cardiology

## 2022-09-18 NOTE — Telephone Encounter (Signed)
Patients wife came into [place] office requesting Eliquis 5 mg samples, patient was completely out. Eliquis 5 mg samples x box given to patient

## 2022-09-18 NOTE — Telephone Encounter (Signed)
Patient been receiving Eliquis from [facility] office advise to contact there office and also advise information about patient assistance with [employer]

## 2022-09-18 NOTE — Telephone Encounter (Signed)
Patient calling the office for samples of medication:   1.  What medication and dosage are you requesting samples for? apixaban (ELIQUIS) 5 MG TABS tablet  2.  Are you currently out of this medication? Yes    Call Cindy Day at [phone]

## 2022-09-18 NOTE — Telephone Encounter (Signed)
Pt's wife states she received a call and states that her phone was not working correctly. Requesting call back.

## 2022-09-23 ENCOUNTER — Telehealth: Payer: Self-pay

## 2022-09-23 NOTE — Chronic Care Management (AMB) (Signed)
09-23-2022: Received a message from [facility] to request prescription for potassium chloride from Dr. Carolin Sicks. Contacted office and spoke with Estill Bamberg from the clinical team and she stated she will send prescription to [facility]. Lauren updated.  [place] Pharmacist Assistant [phone]

## 2022-09-30 ENCOUNTER — Other Ambulatory Visit: Payer: Self-pay | Admitting: Family Medicine

## 2022-09-30 ENCOUNTER — Telehealth: Payer: Self-pay

## 2022-09-30 NOTE — Progress Notes (Signed)
Chronic Care Management Pharmacy Assistant   Name: Shawn Sullivan  MRN: [id number] DOB: Mar 25, 1946   Reason for Encounter: Disease State call for DM    Recent office visits:  None  Recent consult visits:  None  Hospital visits:  None  Medications: Outpatient Encounter Medications as of 09/30/2022  Medication Sig   acetaminophen (TYLENOL) 500 MG tablet Take 1,000 mg by mouth every 6 (six) hours as needed for moderate pain or headache.   amiodarone (PACERONE) 200 MG tablet Take 1 tablet (200 mg total) by mouth every evening.   apixaban (ELIQUIS) 5 MG TABS tablet Take 1 tablet (5 mg total) by mouth 2 (two) times daily.   BD PEN NEEDLE MICRO U/F 32G X 6 MM MISC SMARTSIG:1 Needle SUB-Q Daily   Budeson-Glycopyrrol-Formoterol (BREZTRI AEROSPHERE) 160-9-4.8 MCG/ACT AERO Inhale 2 puffs into the lungs 2 (two) times daily.   cholecalciferol (VITAMIN D3) 25 MCG (1000 UT) tablet Take 1,000 Units by mouth daily.    dapagliflozin propanediol (FARXIGA) 10 MG TABS tablet Take 1 tablet (10 mg total) by mouth daily before breakfast.   folic acid (FOLVITE) 176 MCG tablet Take 800 mcg by mouth daily.   HYDROcodone-acetaminophen (NORCO) 10-325 MG tablet Take 1 tablet by mouth every 4 (four) hours as needed for moderate pain.   insulin degludec (TRESIBA FLEXTOUCH) 100 UNIT/ML FlexTouch Pen Inject 55 Units into the skin daily.   ipratropium-albuterol (DUONEB) 0.5-2.5 (3) MG/3ML SOLN Take 3 mLs by nebulization every 6 (six) hours as needed (shortness of breath).   levocetirizine (XYZAL) 5 MG tablet Take 5 mg by mouth daily as needed for allergies.   metolazone (ZAROXOLYN) 5 MG tablet Take 5 mg by mouth once a week. Uses prn if swollen once weekly.   metoprolol tartrate (LOPRESSOR) 50 MG tablet TAKE 1 TABLET BY MOUTH IN THE MORNING AND 1 AT NOON AND 1 AT BEDTIME   montelukast (SINGULAIR) 10 MG tablet Take 1 tablet (10 mg total) by mouth every evening.   Multiple Vitamin (MULTIVITAMIN) tablet Take 1  tablet by mouth daily.   Omeprazole 20 MG TBEC Take 20 mg by mouth 2 (two) times daily before a meal.    Potassium Chloride ER 20 MEQ TBCR Take 1 tablet by mouth 2 (two) times daily.   rOPINIRole (REQUIP) 0.5 MG tablet TAKE 1 TABLET BY MOUTH AT BEDTIME (Patient taking differently: at bedtime as needed.)   rosuvastatin (CRESTOR) 40 MG tablet Take 1 tablet (40 mg total) by mouth daily.   Semaglutide (OZEMPIC, 1 MG/DOSE, [place]) Inject 1 mg into the skin once a week.   sertraline (ZOLOFT) 50 MG tablet Take 1 tablet (50 mg total) by mouth 2 (two) times daily.   tamsulosin (FLOMAX) 0.4 MG CAPS capsule Take 1 capsule (0.4 mg total) by mouth at bedtime.   tiZANidine (ZANAFLEX) 2 MG tablet Take 1 tablet (2 mg total) by mouth at bedtime.   torsemide (DEMADEX) 20 MG tablet Take 2 tablets (40 mg total) by mouth 2 (two) times daily.   VENTOLIN HFA 108 (90 Base) MCG/ACT inhaler INHALE 2 PUFFS BY MOUTH EVERY 4 HOURS AS NEEDED FOR WHEEZING FOR SHORTNESS OF BREATH   No facility-administered encounter medications on file as of 09/30/2022.    Recent Relevant Labs: Lab Results  Component Value Date/Time   HGBA1C 7.3 (H) 07/16/2022 10:20 AM   HGBA1C 7.7 (H) 04/02/2022 02:32 PM   MICROALBUR 80 01/14/2021 11:53 AM   MICROALBUR 150 05/31/2020 11:09 AM    Kidney Function  Lab Results  Component Value Date/Time   CREATININE 1.37 (H) 07/16/2022 10:20 AM   CREATININE 1.54 (H) 04/02/2022 02:32 PM   GFRNONAA 52 (L) 12/10/2020 01:42 PM   GFRAA 60 12/10/2020 01:42 PM     Current antihyperglycemic regimen:  Farxiga '10mg'$  daily  Tresiba Flextouch Inject 55 units daily  Semaglutide Inject 1 mg once a week  Patient verbally confirms he is taking the above medications as directed. Yes  What recent interventions/DTPs have been made to improve glycemic control:  No changes  Have there been any recent hospitalizations or ED visits since last visit with CPP? No  How often are you checking your blood sugar? in the  morning before eating or drinking  What are your blood sugars ranging?  Fasting: 10/01/22 143, 09/30/22 144, 09/29/22 130  On insulin? Yes How many units:55  During the week, how often does your blood glucose drop below 70? Never  Are you checking your feet daily/regularly? Yes  Adherence Review: Is the patient currently on a STATIN medication? Yes Is the patient currently on ACE/ARB medication? No Does the patient have >5 day gap between last estimated fill dates? CPP to review  Care Gaps: Last eye exam / Retinopathy Screening? Never done  Last Annual Wellness Visit? 08/12/22 Last Diabetic Foot Exam? 12/24/21   Star Rating Drugs:  Medication:  Last Fill: Day Supply Farxiga  (PAP) Semaglutide (PAP)   [name], [place] Pharmacist Assistant  [phone]

## 2022-10-07 ENCOUNTER — Other Ambulatory Visit: Payer: Self-pay

## 2022-10-08 ENCOUNTER — Other Ambulatory Visit: Payer: Self-pay

## 2022-10-08 ENCOUNTER — Telehealth: Payer: Self-pay

## 2022-10-08 MED ORDER — SERTRALINE HCL 50 MG PO TABS: 50.0000 mg | ORAL_TABLET | Freq: Two times a day (BID) | ORAL | 0 refills | Status: DC

## 2022-10-08 NOTE — Progress Notes (Signed)
Chronic Care Management Pharmacy Assistant   Name: Shawn Sullivan  MRN: [id number] DOB: 1946/09/09   Reason for Encounter: Medication Coordination for [place]    Recent office visits:  None  Recent consult visits:  None  Hospital visits:  None  Medications: Outpatient Encounter Medications as of 10/08/2022  Medication Sig   acetaminophen (TYLENOL) 500 MG tablet Take 1,000 mg by mouth every 6 (six) hours as needed for moderate pain or headache.   amiodarone (PACERONE) 200 MG tablet Take 1 tablet (200 mg total) by mouth every evening.   apixaban (ELIQUIS) 5 MG TABS tablet Take 1 tablet (5 mg total) by mouth 2 (two) times daily.   BD PEN NEEDLE MICRO U/F 32G X 6 MM MISC SMARTSIG:1 Needle SUB-Q Daily   Budeson-Glycopyrrol-Formoterol (BREZTRI AEROSPHERE) 160-9-4.8 MCG/ACT AERO Inhale 2 puffs into the lungs 2 (two) times daily.   cholecalciferol (VITAMIN D3) 25 MCG (1000 UT) tablet Take 1,000 Units by mouth daily.    dapagliflozin propanediol (FARXIGA) 10 MG TABS tablet Take 1 tablet (10 mg total) by mouth daily before breakfast.   folic acid (FOLVITE) 016 MCG tablet Take 800 mcg by mouth daily.   HYDROcodone-acetaminophen (NORCO) 10-325 MG tablet Take 1 tablet by mouth every 4 (four) hours as needed for moderate pain.   insulin degludec (TRESIBA FLEXTOUCH) 100 UNIT/ML FlexTouch Pen Inject 55 Units into the skin daily.   ipratropium-albuterol (DUONEB) 0.5-2.5 (3) MG/3ML SOLN Take 3 mLs by nebulization every 6 (six) hours as needed (shortness of breath).   levocetirizine (XYZAL) 5 MG tablet Take 5 mg by mouth daily as needed for allergies.   metolazone (ZAROXOLYN) 5 MG tablet Take 5 mg by mouth once a week. Uses prn if swollen once weekly.   metoprolol tartrate (LOPRESSOR) 50 MG tablet TAKE 1 TABLET BY MOUTH IN THE MORNING AND 1 AT NOON AND 1 AT BEDTIME   montelukast (SINGULAIR) 10 MG tablet Take 1 tablet (10 mg total) by mouth every evening.   Multiple Vitamin (MULTIVITAMIN)  tablet Take 1 tablet by mouth daily.   Omeprazole 20 MG TBEC Take 20 mg by mouth 2 (two) times daily before a meal.    Potassium Chloride ER 20 MEQ TBCR Take 1 tablet by mouth 2 (two) times daily.   rOPINIRole (REQUIP) 0.5 MG tablet TAKE 1 TABLET BY MOUTH AT BEDTIME (Patient taking differently: at bedtime as needed.)   rosuvastatin (CRESTOR) 40 MG tablet Take 1 tablet (40 mg total) by mouth daily.   Semaglutide (OZEMPIC, 1 MG/DOSE, ) Inject 1 mg into the skin once a week.   sertraline (ZOLOFT) 50 MG tablet Take 1 tablet (50 mg total) by mouth 2 (two) times daily.   tamsulosin (FLOMAX) 0.4 MG CAPS capsule Take 1 capsule (0.4 mg total) by mouth at bedtime.   tiZANidine (ZANAFLEX) 2 MG tablet Take 1 tablet (2 mg total) by mouth at bedtime.   torsemide (DEMADEX) 20 MG tablet Take 2 tablets (40 mg total) by mouth 2 (two) times daily.   VENTOLIN HFA 108 (90 Base) MCG/ACT inhaler INHALE 2 PUFFS BY MOUTH EVERY 4 HOURS AS NEEDED FOR WHEEZING FOR SHORTNESS OF BREATH   No facility-administered encounter medications on file as of 10/08/2022.    Reviewed chart for medication changes ahead of medication coordination call.  No OVs, Consults, or hospital visits since last care coordination call/Pharmacist visit.   No medication changes indicated OR if recent visit, treatment plan here.  BP Readings from Last 3 Encounters:  07/23/22  120/84  04/02/22 120/64  03/13/22 126/78    Lab Results  Component Value Date   HGBA1C 7.3 (H) 07/16/2022     Patient obtains medications through Adherence Packaging  90 Days   Patient is due for first adherence delivery on: 10/20/22. Called patient and reviewed medications and coordinated delivery.  This delivery to include: Amiodarone '200mg'$  1 EM Metoprolol Tartrate '50mg'$  1 B 1 EM 1 BT Montelukast '10mg'$  1 BT Potassium Cl ER 20 meq 1 B 1 BT Rosuvastatin '40mg'$  1 BT Sertraline '50mg'$  1 B 1 BT Tamsulosin 0.'4mg'$  1 BT Torsemide '20mg'$  2 B 2 L  Patient declined the  following medications Getting Pap on the following:  (Breztri, Farxiga, Tresiba, Ozempic, Eliquis)  Omeprazole '20mg'$ -Gets OTC Hydrocdone- Gets at [facility], takes PRN Ropinirole 0.'5mg'$ - Not taking Tizanidine '2mg'$ -Getting at [facility], takes PRN Ventolin Inhaler- filled at [facility] on 10/01/22 25ds  Patient needs refills-Request Sent  Sertraline '50mg'$ -Need 90days  Confirmed delivery date of 10/20/22, advised patient that pharmacy will contact them the morning of delivery.   [name], [place] Pharmacist Assistant  [phone]

## 2022-10-12 ENCOUNTER — Other Ambulatory Visit: Payer: Self-pay | Admitting: Family Medicine

## 2022-10-12 NOTE — Telephone Encounter (Signed)
Compliant on meds 

## 2022-10-13 ENCOUNTER — Other Ambulatory Visit: Payer: Self-pay

## 2022-10-13 DIAGNOSIS — I350 Nonrheumatic aortic (valve) stenosis: Secondary | ICD-10-CM

## 2022-10-15 ENCOUNTER — Other Ambulatory Visit: Payer: Self-pay | Admitting: Family Medicine

## 2022-10-27 ENCOUNTER — Ambulatory Visit: Payer: Medicare HMO | Admitting: Family Medicine

## 2022-11-09 ENCOUNTER — Encounter: Payer: Self-pay | Admitting: Family Medicine

## 2022-11-09 ENCOUNTER — Ambulatory Visit (INDEPENDENT_AMBULATORY_CARE_PROVIDER_SITE_OTHER): Payer: Medicare HMO | Admitting: Family Medicine

## 2022-11-09 VITALS — BP 110/60 | HR 74 | Temp 97.1°F | Resp 18 | Ht 71.0 in | Wt 309.0 lb

## 2022-11-09 DIAGNOSIS — I5041 Acute combined systolic (congestive) and diastolic (congestive) heart failure: Secondary | ICD-10-CM | POA: Diagnosis not present

## 2022-11-09 DIAGNOSIS — G4733 Obstructive sleep apnea (adult) (pediatric): Secondary | ICD-10-CM

## 2022-11-09 DIAGNOSIS — Z23 Encounter for immunization: Secondary | ICD-10-CM

## 2022-11-09 DIAGNOSIS — E782 Mixed hyperlipidemia: Secondary | ICD-10-CM

## 2022-11-09 DIAGNOSIS — I130 Hypertensive heart and chronic kidney disease with heart failure and stage 1 through stage 4 chronic kidney disease, or unspecified chronic kidney disease: Secondary | ICD-10-CM

## 2022-11-09 DIAGNOSIS — N1832 Chronic kidney disease, stage 3b: Secondary | ICD-10-CM

## 2022-11-09 DIAGNOSIS — K219 Gastro-esophageal reflux disease without esophagitis: Secondary | ICD-10-CM

## 2022-11-09 DIAGNOSIS — E0842 Diabetes mellitus due to underlying condition with diabetic polyneuropathy: Secondary | ICD-10-CM | POA: Diagnosis not present

## 2022-11-09 DIAGNOSIS — Z794 Long term (current) use of insulin: Secondary | ICD-10-CM

## 2022-11-09 DIAGNOSIS — D6869 Other thrombophilia: Secondary | ICD-10-CM

## 2022-11-09 DIAGNOSIS — J410 Simple chronic bronchitis: Secondary | ICD-10-CM | POA: Diagnosis not present

## 2022-11-09 DIAGNOSIS — I482 Chronic atrial fibrillation, unspecified: Secondary | ICD-10-CM

## 2022-11-09 DIAGNOSIS — Z6841 Body Mass Index (BMI) 40.0 and over, adult: Secondary | ICD-10-CM

## 2022-11-09 DIAGNOSIS — N1831 Chronic kidney disease, stage 3a: Secondary | ICD-10-CM | POA: Diagnosis not present

## 2022-11-09 DIAGNOSIS — D649 Anemia, unspecified: Secondary | ICD-10-CM

## 2022-11-09 MED ORDER — TIZANIDINE HCL 2 MG PO TABS: 2.0000 mg | ORAL_TABLET | Freq: Every day | ORAL | 2 refills | Status: DC

## 2022-11-09 NOTE — Progress Notes (Signed)
Subjective:  Patient ID: Shawn Sullivan, male    DOB: August 01, 1946  Age: 76 y.o. MRN: [id number]  Chief Complaint  Patient presents with   Diabetes   Hyperlipidemia    HPI   Diabetes:  Complications: neuropathy.  Glucose checking: every 2-3 days Glucose logs: 130-150 fasting. Hypoglycemia: no Most recent A1C: 7.3 Current medications: farxiga 10 mg daily, tresiba 56 U daily.Taking ozempic 1 mg weekly.   Last Eye Exam: due for an appt.  Foot checks:continues to check daily.   Hyperlipidemia: Current medications: crestor 40 mg daily and coenzyme q10.   Hypertension: Complications: CKD. Kidney function stable.  Current medications: on metoprolol tartrate 50 mg three times a day, torsemide 20 mg 2 po twice daily, metolazone 5 mg once a wk as needed swelling. and potassium 20 meq twice daily.  Medications: checked and unchanged. Swelling has improved.   Atrial fibrillation: on amiodarone 200 mg daily, metoprolol 50 mg three times a day.  and eliquis 5 mg twice daily. Dr. Bettina Gavia gave good report.  RLS: on requip 0.5 mg once daily as needed.  Chronic pain syndrome: Sees pain clinic. O  Mild recurrent depression: on zoloft 50 mg twice daily     11/09/2022   11:16 AM 08/20/2022   12:01 PM 07/16/2022    9:47 AM  PHQ9 SCORE ONLY  PHQ-9 Total Score 0 9 9    GERD: on omeprazole 20 mg 1 pill  oral twice daily  COPD: Breztri 2 puffs twice daily. Using duoneb treatments/albuterol hfa once a day a most. Often does not need it at all. Marland Kitchenalso on singulair and xyzal.   Diet: not healthy. Eating late at night. exercise: walking some.       12/24/2021   11:39 AM 04/02/2022   11:23 AM 07/16/2022    9:50 AM 08/20/2022   12:01 PM 11/09/2022   11:15 AM  Fall Risk  Falls in the past year? 0 '1 1 1 '$ 0  Was there an injury with Fall? 0 1 0 0 0  Fall Risk Category Calculator 0 '3 2 2 '$ 0  Fall Risk Category Low High Moderate Moderate Low  Patient Fall Risk Level   Moderate fall risk Moderate fall  risk Low fall risk  Patient at Risk for Falls Due to   History of fall(s);Orthopedic patient Orthopedic patient;Impaired balance/gait No Fall Risks  Fall risk Follow up Falls evaluation completed Falls evaluation completed Falls evaluation completed;Education provided Falls evaluation completed;Falls prevention discussed Falls evaluation completed     Current Outpatient Medications on File Prior to Visit  Medication Sig Dispense Refill   acetaminophen (TYLENOL) 500 MG tablet Take 1,000 mg by mouth every 6 (six) hours as needed for moderate pain or headache.     amiodarone (PACERONE) 200 MG tablet Take 1 tablet (200 mg total) by mouth every evening. 90 tablet 3   apixaban (ELIQUIS) 5 MG TABS tablet Take 1 tablet (5 mg total) by mouth 2 (two) times daily. 180 tablet 1   BD PEN NEEDLE MICRO U/F 32G X 6 MM MISC SMARTSIG:1 Needle SUB-Q Daily     Budeson-Glycopyrrol-Formoterol (BREZTRI AEROSPHERE) 160-9-4.8 MCG/ACT AERO Inhale 2 puffs into the lungs 2 (two) times daily. 10.7 g 3   cholecalciferol (VITAMIN D3) 25 MCG (1000 UT) tablet Take 1,000 Units by mouth daily.      dapagliflozin propanediol (FARXIGA) 10 MG TABS tablet Take 1 tablet (10 mg total) by mouth daily before breakfast. 90 tablet 3   folic acid (FOLVITE) 540  MCG tablet Take 800 mcg by mouth daily.     HYDROcodone-acetaminophen (NORCO) 10-325 MG tablet Take 1 tablet by mouth every 4 (four) hours as needed for moderate pain.     insulin degludec (TRESIBA FLEXTOUCH) 100 UNIT/ML FlexTouch Pen Inject 56 Units into the skin daily.     ipratropium-albuterol (DUONEB) 0.5-2.5 (3) MG/3ML SOLN Take 3 mLs by nebulization every 6 (six) hours as needed (shortness of breath). 360 mL 1   levocetirizine (XYZAL) 5 MG tablet Take 5 mg by mouth daily as needed for allergies.     metolazone (ZAROXOLYN) 5 MG tablet Take 5 mg by mouth once a week. Uses prn if swollen once weekly.     metoprolol tartrate (LOPRESSOR) 50 MG tablet TAKE 1 TABLET BY MOUTH IN THE  MORNING AND 1 AT NOON AND 1 AT BEDTIME 270 tablet 3   montelukast (SINGULAIR) 10 MG tablet Take 1 tablet (10 mg total) by mouth every evening. 90 tablet 1   Omeprazole 20 MG TBEC Take 20 mg by mouth 2 (two) times daily before a meal.      Potassium Chloride ER 20 MEQ TBCR Take 1 tablet by mouth 2 (two) times daily.     rOPINIRole (REQUIP) 0.5 MG tablet TAKE 1 TABLET BY MOUTH AT BEDTIME (Patient taking differently: at bedtime as needed.) 30 tablet 2   rosuvastatin (CRESTOR) 40 MG tablet TAKE ONE TABLET BY MOUTH EVERYDAY AT BEDTIME 90 tablet 0   Semaglutide (OZEMPIC, 1 MG/DOSE, [place]) Inject 1 mg into the skin once a week.     sertraline (ZOLOFT) 50 MG tablet Take 1 tablet (50 mg total) by mouth 2 (two) times daily. 180 tablet 0   tamsulosin (FLOMAX) 0.4 MG CAPS capsule Take 1 capsule (0.4 mg total) by mouth at bedtime. 90 capsule 1   torsemide (DEMADEX) 20 MG tablet TAKE TWO TABLETS BY MOUTH WITH A MEAL and TAKE TWO TABLETS BY MOUTH AT NOON 180 tablet 1   VENTOLIN HFA 108 (90 Base) MCG/ACT inhaler INHALE 2 PUFFS BY MOUTH EVERY 4 HOURS AS NEEDED FOR WHEEZING FOR SHORTNESS OF BREATH 18 g 0   Multiple Vitamin (MULTIVITAMIN) tablet Take 1 tablet by mouth daily. (Patient not taking: Reported on 11/09/2022)     No current facility-administered medications on file prior to visit.   Past Medical History:  Diagnosis Date   Anticoagulated 07/01/2018   Anticoagulated 07/01/2018   Benign essential hypertension 05/06/2013   Chronic obstructive pulmonary disease ([place]) 11/05/2015   COPD (chronic obstructive pulmonary disease) ([place]) 07/01/2018   Dermatochalasis 12/16/2015   Disorder of bursae and tendons in shoulder region 01/05/2014   Generalized edema 06/22/2016   Hyperlipidemia 12/16/2015   Hypertensive heart disease 07/01/2018   Low back pain 08/17/2013   Lumbosacral spondylosis 05/06/2013   Obstructive sleep apnea 07/01/2018   Persistent atrial fibrillation ([place]) 07/01/2018   Piriformis syndrome 05/03/2014   Postlaminectomy  syndrome, lumbar region 05/06/2013   Presbyopia of both eyes 12/16/2015   Restless legs syndrome 03/23/2016   Restrictive lung disease 06/22/2016   Sleep apnea 05/06/2013   Type 2 diabetes mellitus without complications ([place]) 08/30/7509   Past Surgical History:  Procedure Laterality Date   APPENDECTOMY     BACK SURGERY     CARDIOVERSION N/A 12/01/2018   Procedure: CARDIOVERSION;  Surgeon: Sanda Klein, MD;  Location: [facility];  Service: Cardiovascular;  Laterality: N/A;   CATARACT EXTRACTION Bilateral    LUMBAR FUSION     NECK SURGERY  Family History  Problem Relation Age of Onset   Atrial fibrillation Mother    Cancer Mother    Heart disease Father    Social History   Socioeconomic History   Marital status: Married    Spouse name: Not on file   Number of children: Not on file   Years of education: Not on file   Highest education level: Not on file  Occupational History   Not on file  Tobacco Use   Smoking status: Former    Types: Cigarettes    Quit date: 31    Years since quitting: 30.9   Smokeless tobacco: Never  Vaping Use   Vaping Use: Never used  Substance and Sexual Activity   Alcohol use: Not Currently   Drug use: Not Currently    Types: Hydrocodone   Sexual activity: Not on file  Other Topics Concern   Not on file  Social History Narrative   Not on file   Social Determinants of Health   Financial Resource Strain: High Risk (08/04/2022)   Overall Financial Resource Strain (CARDIA)    Difficulty of Paying Living Expenses: Very hard  Food Insecurity: No Food Insecurity (07/09/2020)   Hunger Vital Sign    Worried About Running Out of Food in the Last Year: Never true    Ran Out of Food in the Last Year: Never true  Transportation Needs: No Transportation Needs (02/03/2022)   PRAPARE - Hydrologist (Medical): No    Lack of Transportation (Non-Medical): No  Physical Activity: Inactive (08/04/2022)   Exercise Vital Sign     Days of Exercise per Week: 0 days    Minutes of Exercise per Session: 0 min  Stress: Not on file  Social Connections: Not on file    Review of Systems  Constitutional:  Negative for chills and fever.  HENT:  Negative for congestion, rhinorrhea and sore throat.   Respiratory:  Negative for cough and shortness of breath.   Cardiovascular:  Positive for leg swelling. Negative for chest pain and palpitations.  Gastrointestinal:  Negative for abdominal pain, constipation, diarrhea, nausea and vomiting.  Genitourinary:  Negative for dysuria and urgency.  Musculoskeletal:  Positive for back pain. Negative for arthralgias and myalgias.  Neurological:  Positive for dizziness (gets up to quick). Negative for headaches.  Psychiatric/Behavioral:  Negative for dysphoric mood. The patient is not nervous/anxious.      Objective:  BP 110/60   Pulse 74   Temp (!) 97.1 F (36.2 C)   Resp 18   Ht '5\' 11"'$  (1.803 m)   Wt (!) 309 lb (140.2 kg)   BMI 43.10 kg/m      11/09/2022   11:11 AM 07/23/2022    9:02 PM 04/02/2022   11:08 AM  BP/Weight  Systolic BP 284 132 440  Diastolic BP 60 84 64  Wt. (Lbs) 309 317 317  BMI 43.1 kg/m2 44.21 kg/m2 44.21 kg/m2    Physical Exam Vitals reviewed.  Constitutional:      Appearance: Normal appearance. He is obese.  Neck:     Vascular: No carotid bruit.  Cardiovascular:     Rate and Rhythm: Normal rate and regular rhythm.     Pulses: Normal pulses.     Heart sounds: Normal heart sounds.  Pulmonary:     Effort: Pulmonary effort is normal.     Breath sounds: Normal breath sounds. No wheezing, rhonchi or rales.  Abdominal:     General: Bowel sounds  are normal.     Palpations: Abdomen is soft.     Tenderness: There is no abdominal tenderness.  Neurological:     Mental Status: He is alert and oriented to person, place, and time.  Psychiatric:        Mood and Affect: Mood normal.        Behavior: Behavior normal.     Diabetic Foot Exam - Simple    Simple Foot Form Diabetic Foot exam was performed with the following findings: Yes 11/09/2022 10:40 PM  Visual Inspection See comments: Yes Sensation Testing Intact to touch and monofilament testing bilaterally: Yes Pulse Check Posterior Tibialis and Dorsalis pulse intact bilaterally: Yes Comments Calluses. Thickened nails.       Lab Results  Component Value Date   WBC 11.0 (H) 07/16/2022   HGB 11.0 (L) 07/16/2022   HCT 38.5 07/16/2022   PLT 176 07/16/2022   GLUCOSE 96 07/16/2022   CHOL 130 07/16/2022   TRIG 164 (H) 07/16/2022   HDL 35 (L) 07/16/2022   LDLCALC 67 07/16/2022   ALT 16 07/16/2022   AST 26 07/16/2022   NA 139 07/16/2022   K 3.7 07/16/2022   CL 101 07/16/2022   CREATININE 1.37 (H) 07/16/2022   BUN 15 07/16/2022   CO2 24 07/16/2022   TSH 2.380 04/02/2022   INR 1.1 11/25/2018   HGBA1C 7.3 (H) 07/16/2022   MICROALBUR 80 01/14/2021      Assessment & Plan:   Problem List Items Addressed This Visit       Cardiovascular and Mediastinum   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  No changes to medicines. Continue on metoprolol tartrate 50 mg three times a day, torsemide 20 mg 2 po twice daily, metolazone 5 mg once a wk as needed swelling. and potassium 20 meq twice daily.  Continue to work on eating a healthy diet and exercise.  Labs drawn today.        Relevant Orders   CBC with Differential/Platelet   Comprehensive metabolic panel   Lipid panel   TSH   VITAMIN D 25 Hydroxy (Vit-D Deficiency, Fractures)   PTH, Intact (ICMA) and Ionized Calcium   Iron, TIBC and Ferritin Panel   Atrial fibrillation, chronic (HCC)    The current medical regimen is effective;  continue present plan and medications. Management per specialist.          Respiratory   Obstructive sleep apnea    Continue CPAP.      Chronic obstructive  pulmonary disease (HCC)    The current medical regimen is effective;  continue present plan and medications.         Digestive   Gastroesophageal reflux disease without esophagitis    The current medical regimen is effective;  continue present plan and medications.         Endocrine   Diabetes mellitus due to underlying condition, controlled, with diabetic polyneuropathy, with long-term current use of insulin (HCC)    Control: good Recommend check sugars fasting daily. Recommend check feet daily. Recommend annual eye exams. Medicines: Continue : farxiga 10 mg daily, tresiba 56 U daily.Taking ozempic 1 mg weekly.    Continue to work on eating a healthy diet and exercise.  Labs drawn today.         Relevant Medications   tiZANidine (ZANAFLEX) 2 MG tablet   Other Relevant Orders  Hemoglobin A1c     Genitourinary   CKD (chronic kidney disease) stage 3, GFR 30-59 ml/min (HCC)    Stable. Seeing nephrology.        Hematopoietic and Hemostatic   Acquired thrombophilia ([place])    Secondary to eliquis        Other   Hyperlipidemia    Well controlled.  No changes to medicines. Continue crestor 40 mg before bed and coenzyme q10 daily.  Continue to work on eating a healthy diet and exercise.  Labs drawn today.        Class 3 severe obesity due to excess calories with serious comorbidity and body mass index (BMI) of 40.0 to 44.9 in adult [facility])    Recommend continue to work on eating healthy diet and exercise.       Other Visit Diagnoses     Need for influenza vaccination    -  Primary   Relevant Orders   Flu Vaccine QUAD High Dose(Fluad) (Completed)   Need for COVID-19 vaccine       Relevant Orders   Pfizer Fall 2023 Covid-19 Vaccine 44yr and older (Completed)   Normocytic anemia       Relevant Orders   Iron, TIBC and Ferritin Panel   PTH, Intact and Calcium     .  Meds ordered this encounter  Medications   tiZANidine (ZANAFLEX) 2 MG tablet    Sig: Take 1  tablet (2 mg total) by mouth at bedtime.    Dispense:  30 tablet    Refill:  2    Orders Placed This Encounter  Procedures   Flu Vaccine QUAD High Dose(Fluad)   Pfizer Fall 2023 Covid-19 Vaccine 166yrand older   CBC with Differential/Platelet   Comprehensive metabolic panel   Hemoglobin A1c   Lipid panel   TSH   VITAMIN D 25 Hydroxy (Vit-D Deficiency, Fractures)   PTH, Intact (ICMA) and Ionized Calcium   Iron, TIBC and Ferritin Panel   PTH, Intact and Calcium     Follow-up: Return in about 3 months (around 02/08/2023) for chronic fasting.  An After Visit Summary was printed and given to the patient.  KiRochel BromeMD [facility] [phone]

## 2022-11-09 NOTE — Assessment & Plan Note (Signed)
The current medical regimen is effective;  continue present plan and medications.  

## 2022-11-09 NOTE — Assessment & Plan Note (Signed)
Recommend continue to work on eating healthy diet and exercise.  

## 2022-11-09 NOTE — Assessment & Plan Note (Addendum)
Stable. Seeing nephrology.

## 2022-11-09 NOTE — Assessment & Plan Note (Signed)
Well controlled.  No changes to medicines. Continue crestor 40 mg before bed and coenzyme q10 daily.  Continue to work on eating a healthy diet and exercise.  Labs drawn today.

## 2022-11-09 NOTE — Patient Instructions (Signed)
Recommend tetanus (tdap) and shingrix vaccine series at pharmacy Recommend RSV vaccine at pharmacy. Recommend continue to work on eating healthy diet and exercise.

## 2022-11-09 NOTE — Assessment & Plan Note (Signed)
The current medical regimen is effective;  continue present plan and medications. Management per specialist.   

## 2022-11-09 NOTE — Assessment & Plan Note (Signed)
Secondary to eliquis

## 2022-11-09 NOTE — Assessment & Plan Note (Signed)
Control: good Recommend check sugars fasting daily. Recommend check feet daily. Recommend annual eye exams. Medicines: Continue : farxiga 10 mg daily, tresiba 56 U daily.Taking ozempic 1 mg weekly.    Continue to work on eating a healthy diet and exercise.  Labs drawn today.

## 2022-11-09 NOTE — Assessment & Plan Note (Signed)
Continue CPAP.  

## 2022-11-09 NOTE — Assessment & Plan Note (Signed)
Well controlled.  No changes to medicines. Continue on metoprolol tartrate 50 mg three times a day, torsemide 20 mg 2 po twice daily, metolazone 5 mg once a wk as needed swelling. and potassium 20 meq twice daily.  Continue to work on eating a healthy diet and exercise.  Labs drawn today.

## 2022-11-10 LAB — CBC WITH DIFFERENTIAL/PLATELET
Basophils Absolute: 0.1 10*3/uL (ref 0.0–0.2)
Basos: 1 %
EOS (ABSOLUTE): 0.2 10*3/uL (ref 0.0–0.4)
Eos: 2 %
Hematocrit: 51.5 % — ABNORMAL HIGH (ref 37.5–51.0)
Hemoglobin: 16.7 g/dL (ref 13.0–17.7)
Immature Grans (Abs): 0 10*3/uL (ref 0.0–0.1)
Immature Granulocytes: 0 %
Lymphocytes Absolute: 2.5 10*3/uL (ref 0.7–3.1)
Lymphs: 23 %
MCH: 29.3 pg (ref 26.6–33.0)
MCHC: 32.4 g/dL (ref 31.5–35.7)
MCV: 90 fL (ref 79–97)
Monocytes Absolute: 0.8 10*3/uL (ref 0.1–0.9)
Monocytes: 8 %
Neutrophils Absolute: 7.1 10*3/uL — ABNORMAL HIGH (ref 1.4–7.0)
Neutrophils: 66 %
Platelets: 158 10*3/uL (ref 150–450)
RBC: 5.7 x10E6/uL (ref 4.14–5.80)
RDW: 14.2 % (ref 11.6–15.4)
WBC: 10.8 10*3/uL (ref 3.4–10.8)

## 2022-11-10 LAB — COMPREHENSIVE METABOLIC PANEL
ALT: 21 IU/L (ref 0–44)
AST: 26 IU/L (ref 0–40)
Albumin/Globulin Ratio: 1.5 (ref 1.2–2.2)
Albumin: 4.1 g/dL (ref 3.8–4.8)
Alkaline Phosphatase: 116 IU/L (ref 44–121)
BUN/Creatinine Ratio: 13 (ref 10–24)
BUN: 28 mg/dL — ABNORMAL HIGH (ref 8–27)
Bilirubin Total: 1.3 mg/dL — ABNORMAL HIGH (ref 0.0–1.2)
CO2: 28 mmol/L (ref 20–29)
Calcium: 10.3 mg/dL — ABNORMAL HIGH (ref 8.6–10.2)
Chloride: 93 mmol/L — ABNORMAL LOW (ref 96–106)
Creatinine, Ser: 2.11 mg/dL — ABNORMAL HIGH (ref 0.76–1.27)
Globulin, Total: 2.7 g/dL (ref 1.5–4.5)
Glucose: 132 mg/dL — ABNORMAL HIGH (ref 70–99)
Potassium: 3.2 mmol/L — ABNORMAL LOW (ref 3.5–5.2)
Sodium: 142 mmol/L (ref 134–144)
Total Protein: 6.8 g/dL (ref 6.0–8.5)
eGFR: 31 mL/min/{1.73_m2} — ABNORMAL LOW (ref >59)

## 2022-11-10 LAB — IRON,TIBC AND FERRITIN PANEL
Ferritin: 52 ng/mL (ref 30–400)
Iron Saturation: 20 % (ref 15–55)
Iron: 65 ug/dL (ref 38–169)
Total Iron Binding Capacity: 324 ug/dL (ref 250–450)
UIBC: 259 ug/dL (ref 111–343)

## 2022-11-10 LAB — CARDIOVASCULAR RISK ASSESSMENT

## 2022-11-10 LAB — LIPID PANEL
Chol/HDL Ratio: 4.4 ratio (ref 0.0–5.0)
Cholesterol, Total: 168 mg/dL (ref 100–199)
HDL: 38 mg/dL — ABNORMAL LOW (ref >39)
LDL Chol Calc (NIH): 97 mg/dL (ref 0–99)
Triglycerides: 194 mg/dL — ABNORMAL HIGH (ref 0–149)
VLDL Cholesterol Cal: 33 mg/dL (ref 5–40)

## 2022-11-10 LAB — TSH: TSH: 3.79 u[IU]/mL (ref 0.450–4.500)

## 2022-11-10 LAB — HEMOGLOBIN A1C
Est. average glucose Bld gHb Est-mCnc: 166 mg/dL
Hgb A1c MFr Bld: 7.4 % — ABNORMAL HIGH (ref 4.8–5.6)

## 2022-11-10 LAB — VITAMIN D 25 HYDROXY (VIT D DEFICIENCY, FRACTURES): Vit D, 25-Hydroxy: 21.6 ng/mL — ABNORMAL LOW (ref 30.0–100.0)

## 2022-11-10 NOTE — Progress Notes (Signed)
Blood count normal.  Liver function normal.  Kidney function abnormal. Worsened. Defer management to nephrology.  Potassium 3.2. low. Check if taking POTASSIUM CHLORIDE 20 meq one oral twice daily. If not, start. If is, increase dose to POTASSIUM CHLORIDE 40 MEQ TWICE DAILY.  Thyroid function normal.  Cholesterol: Trigs elevated from 164 to 194. No changes. HBA1C: 7.4. STABLE. No changes.  Nephrology wanted a PTH level and I ordered the test incorrectly (wrong code.)  Ask if he could please come back in 2 weeks for a recheck CMP and PTH.

## 2022-11-16 ENCOUNTER — Telehealth: Payer: Self-pay

## 2022-11-16 NOTE — Telephone Encounter (Signed)
Patient came in today and signed for his Ozempic on 11/09/2022. Hoyle Sauer, RN helped him with this. His Packing list has been put into Tamela's box on 11/16/2022.

## 2022-11-27 ENCOUNTER — Ambulatory Visit: Payer: Medicare HMO

## 2022-12-02 ENCOUNTER — Telehealth: Payer: Self-pay

## 2022-12-02 ENCOUNTER — Other Ambulatory Visit: Payer: Self-pay

## 2022-12-02 MED ORDER — POTASSIUM CHLORIDE CRYS ER 20 MEQ PO TBCR: 40.0000 meq | EXTENDED_RELEASE_TABLET | Freq: Two times a day (BID) | ORAL | 2 refills | Status: DC

## 2022-12-02 NOTE — Progress Notes (Signed)
Error

## 2022-12-03 NOTE — Progress Notes (Signed)
Cardiology Office Note:    Date:  12/04/2022   ID:  Shawn Sullivan, DOB 1946/03/12, MRN [id number]  PCP:  Rochel Brome, MD  Cardiologist:  Shirlee More, MD    Referring MD: Rochel Brome, MD    ASSESSMENT:    1. PAF (paroxysmal atrial fibrillation) ([place])   2. On amiodarone therapy   3. Chronic anticoagulation   4. Hypertensive heart and kidney disease with chronic diastolic congestive heart failure and stage 3 chronic kidney disease, unspecified whether stage 3a or 3b CKD ([place])   5. Nonrheumatic aortic valve stenosis   6. Right bundle branch block    PLAN:    In order of problems listed above:  Has done well long-term with low-dose amiodarone maintaining sinus rhythm will continue the same labs from his PCP office in December showed no evidence of liver or thyroid toxicity and will continue his current anticoagulant Heart failure is well compensated unfortunately his kidney function is worsening progressively diuresed I am not going to alter his diuretics today.  He will continue his loop diuretic as needed metolazone metoprolol. Stable no progressive or significant aortic valve disease Stable EKG pattern   Next appointment: 6 months   Medication Adjustments/Labs and Tests Ordered: Current medicines are reviewed at length with the patient today.  Concerns regarding medicines are outlined above.  Orders Placed This Encounter  Procedures   EKG 12-Lead   No orders of the defined types were placed in this encounter.   Chief Complaint  Patient presents with   Follow-up   Atrial Fibrillation   Congestive Heart Failure   Chronic Kidney Disease   Aortic Stenosis    History of Present Illness:    Shawn Sullivan is a 77 y.o. male with a hx of paroxysmal atrial fibrillation maintaining sinus rhythm on amiodarone therapy chronic anticoagulation right bundle branch block hypertensive heart and kidney disease with chronic diastolic heart failure and stage III CKD mild  aortic stenosis and hype per lipidemia last seen 03/13/2022.  His CKD is worsened creatinine recently has risen from 1.37 and 1.75-2.11 with GFR now 31 cc.  He sees [facility] for his CKD  Echo today:  1. Left ventricular ejection fraction, by estimation, is 55 to 60%. The left ventricle has normal function. Left ventricular endocardial border not optimally defined to evaluate regional wall motion. There is moderate concentric left ventricular  hypertrophy. Left ventricular diastolic parameters were normal.  2. Right ventricular systolic function is normal. The right ventricular size is normal.  3. Left atrial size was mild to moderately dilated.  4. The mitral valve is normal in structure. Mild mitral valve regurgitation. No evidence of mitral stenosis.  5. The aortic valve is tricuspid. Aortic valve regurgitation is not visualized. Mild aortic valve stenosis.  6. The inferior vena cava is normal in size with greater than 50% respiratory variability, suggesting right atrial pressure of 3 mmHg.  Compliance with diet, lifestyle and medications: Yes  Overall is doing well weight is stable and he is taking his second diuretic metolazone perhaps twice a month He is not having edema shortness of breath orthopnea chest pain palpitation or syncope He has had no bleeding complication from his anticoagulant He is following by nephrologist His EKG today shows that he remains in sinus rhythm His echocardiogram shows minimal aortic stenosis and normal left ventricular function Past Medical History:  Diagnosis Date   Anticoagulated 07/01/2018   Anticoagulated 07/01/2018   Benign essential hypertension 05/06/2013   Chronic  obstructive pulmonary disease ([place]) 11/05/2015   COPD (chronic obstructive pulmonary disease) ([place]) 07/01/2018   Dermatochalasis 12/16/2015   Disorder of bursae and tendons in shoulder region 01/05/2014   Generalized edema 06/22/2016   Hyperlipidemia 12/16/2015   Hypertensive  heart disease 07/01/2018   Low back pain 08/17/2013   Lumbosacral spondylosis 05/06/2013   Obstructive sleep apnea 07/01/2018   Persistent atrial fibrillation ([place]) 07/01/2018   Piriformis syndrome 05/03/2014   Postlaminectomy syndrome, lumbar region 05/06/2013   Presbyopia of both eyes 12/16/2015   Restless legs syndrome 03/23/2016   Restrictive lung disease 06/22/2016   Sleep apnea 05/06/2013   Type 2 diabetes mellitus without complications ([place]) 47/04/5464    Past Surgical History:  Procedure Laterality Date   APPENDECTOMY     BACK SURGERY     CARDIOVERSION N/A 12/01/2018   Procedure: CARDIOVERSION;  Surgeon: Sanda Klein, MD;  Location: [facility];  Service: Cardiovascular;  Laterality: N/A;   CATARACT EXTRACTION Bilateral    LUMBAR FUSION     NECK SURGERY      Current Medications: Current Meds  Medication Sig   acetaminophen (TYLENOL) 500 MG tablet Take 1,000 mg by mouth every 6 (six) hours as needed for moderate pain or headache.   amiodarone (PACERONE) 200 MG tablet Take 1 tablet (200 mg total) by mouth every evening.   apixaban (ELIQUIS) 5 MG TABS tablet Take 1 tablet (5 mg total) by mouth 2 (two) times daily.   BD PEN NEEDLE MICRO U/F 32G X 6 MM MISC SMARTSIG:1 Needle SUB-Q Daily   Budeson-Glycopyrrol-Formoterol (BREZTRI AEROSPHERE) 160-9-4.8 MCG/ACT AERO Inhale 2 puffs into the lungs 2 (two) times daily.   cholecalciferol (VITAMIN D3) 25 MCG (1000 UT) tablet Take 1,000 Units by mouth daily.    dapagliflozin propanediol (FARXIGA) 10 MG TABS tablet Take 1 tablet (10 mg total) by mouth daily before breakfast.   folic acid (FOLVITE) 035 MCG tablet Take 800 mcg by mouth daily.   HYDROcodone-acetaminophen (NORCO) 10-325 MG tablet Take 1 tablet by mouth every 4 (four) hours as needed for moderate pain.   insulin degludec (TRESIBA FLEXTOUCH) 100 UNIT/ML FlexTouch Pen Inject 56 Units into the skin daily.   ipratropium-albuterol (DUONEB) 0.5-2.5 (3) MG/3ML SOLN Take 3 mLs by nebulization every  6 (six) hours as needed (shortness of breath).   levocetirizine (XYZAL) 5 MG tablet Take 5 mg by mouth daily as needed for allergies.   metolazone (ZAROXOLYN) 5 MG tablet Take 5 mg by mouth once a week. Uses prn if swollen once weekly.   metoprolol tartrate (LOPRESSOR) 50 MG tablet TAKE 1 TABLET BY MOUTH IN THE MORNING AND 1 AT NOON AND 1 AT BEDTIME   montelukast (SINGULAIR) 10 MG tablet Take 1 tablet (10 mg total) by mouth every evening.   Omeprazole 20 MG TBEC Take 20 mg by mouth 2 (two) times daily before a meal.    potassium chloride SA (KLOR-CON M) 20 MEQ tablet Take 60 mEq by mouth 2 (two) times daily.   rOPINIRole (REQUIP) 0.5 MG tablet TAKE 1 TABLET BY MOUTH AT BEDTIME   rosuvastatin (CRESTOR) 40 MG tablet TAKE ONE TABLET BY MOUTH EVERYDAY AT BEDTIME   Semaglutide (OZEMPIC, 1 MG/DOSE, [place]) Inject 1 mg into the skin once a week.   sertraline (ZOLOFT) 50 MG tablet Take 1 tablet (50 mg total) by mouth 2 (two) times daily.   tamsulosin (FLOMAX) 0.4 MG CAPS capsule Take 1 capsule (0.4 mg total) by mouth at bedtime.   tiZANidine (ZANAFLEX) 2 MG tablet  Take 1 tablet (2 mg total) by mouth at bedtime.   torsemide (DEMADEX) 20 MG tablet TAKE TWO TABLETS BY MOUTH WITH A MEAL and TAKE TWO TABLETS BY MOUTH AT NOON   VENTOLIN HFA 108 (90 Base) MCG/ACT inhaler INHALE 2 PUFFS BY MOUTH EVERY 4 HOURS AS NEEDED FOR WHEEZING FOR SHORTNESS OF BREATH     Allergies:   Androgel [testosterone], Biaxin [clarithromycin], Duloxetine, Gabapentin, Ibuprofen, Lyrica [pregabalin], and Spironolactone   Social History   Socioeconomic History   Marital status: Married    Spouse name: Not on file   Number of children: Not on file   Years of education: Not on file   Highest education level: Not on file  Occupational History   Not on file  Tobacco Use   Smoking status: Former    Types: Cigarettes    Quit date: 31    Years since quitting: 31.0   Smokeless tobacco: Never  Vaping Use   Vaping Use: Never used   Substance and Sexual Activity   Alcohol use: Not Currently   Drug use: Not Currently    Types: Hydrocodone   Sexual activity: Not on file  Other Topics Concern   Not on file  Social History Narrative   Not on file   Social Determinants of Health   Financial Resource Strain: High Risk (08/04/2022)   Overall Financial Resource Strain (CARDIA)    Difficulty of Paying Living Expenses: Very hard  Food Insecurity: No Food Insecurity (07/09/2020)   Hunger Vital Sign    Worried About Running Out of Food in the Last Year: Never true    Ran Out of Food in the Last Year: Never true  Transportation Needs: No Transportation Needs (02/03/2022)   PRAPARE - Hydrologist (Medical): No    Lack of Transportation (Non-Medical): No  Physical Activity: Inactive (08/04/2022)   Exercise Vital Sign    Days of Exercise per Week: 0 days    Minutes of Exercise per Session: 0 min  Stress: Not on file  Social Connections: Not on file     Family History: The patient's family history includes Atrial fibrillation in his mother; Cancer in his mother; Heart disease in his father. ROS:   Please see the history of present illness.    All other systems reviewed and are negative.  EKGs/Labs/Other Studies Reviewed:    The following studies were reviewed today:  Echocardiogram performed 09/11/2020 shows normal ejection fraction 60 to 65% moderate concentric LVH elevated left ventricular filling pressures and mild aortic stenosis with aortic valve calcification peak and mean gradients of 30 and 15 mmHg   EKG:  EKG ordered today and personally reviewed.  The ekg ordered today demonstrates sinus rhythm right bundle branch block left axis deviation left anterior hemiblock  Recent Labs: 11/09/2022: ALT 21; BUN 28; Creatinine, Ser 2.11; Hemoglobin 16.7; Platelets 158; Potassium 3.2; Sodium 142; TSH 3.790  Recent Lipid Panel    Component Value Date/Time   CHOL 168 11/09/2022 1200   TRIG 194  (H) 11/09/2022 1200   HDL 38 (L) 11/09/2022 1200   CHOLHDL 4.4 11/09/2022 1200   LDLCALC 97 11/09/2022 1200    Physical Exam:    VS:  BP 130/72   Pulse 72   Ht '5\' 11"'$  (1.803 m)   Wt (!) 317 lb (143.8 kg)   SpO2 91%   BMI 44.21 kg/m     Wt Readings from Last 3 Encounters:  12/04/22 (!) 317 lb (143.8 kg)  11/09/22 (!) 309 lb (140.2 kg)  07/23/22 (!) 317 lb (143.8 kg)     GEN:  Well nourished, well developed in no acute distress obese BMI greater than 44 HEENT: Normal NECK: No JVD; No carotid bruits LYMPHATICS: No lymphadenopathy CARDIAC: RRR, no murmurs, rubs, gallops RESPIRATORY:  Clear to auscultation without rales, wheezing or rhonchi  ABDOMEN: Soft, non-tender, non-distended MUSCULOSKELETAL:  No edema; No deformity  SKIN: Warm and dry NEUROLOGIC:  Alert and oriented x 3 PSYCHIATRIC:  Normal affect    Signed, Shirlee More, MD  12/04/2022 2:13 PM    [facility]

## 2022-12-04 ENCOUNTER — Encounter: Payer: Self-pay | Admitting: Cardiology

## 2022-12-04 ENCOUNTER — Ambulatory Visit: Payer: Medicare HMO | Attending: Cardiology | Admitting: Cardiology

## 2022-12-04 ENCOUNTER — Ambulatory Visit (INDEPENDENT_AMBULATORY_CARE_PROVIDER_SITE_OTHER): Payer: Medicare HMO

## 2022-12-04 VITALS — BP 130/72 | HR 72 | Ht 71.0 in | Wt 317.0 lb

## 2022-12-04 DIAGNOSIS — I452 Bifascicular block: Secondary | ICD-10-CM | POA: Diagnosis not present

## 2022-12-04 DIAGNOSIS — I480 Paroxysmal atrial fibrillation: Secondary | ICD-10-CM

## 2022-12-04 DIAGNOSIS — N183 Chronic kidney disease, stage 3 unspecified: Secondary | ICD-10-CM

## 2022-12-04 DIAGNOSIS — Z79899 Other long term (current) drug therapy: Secondary | ICD-10-CM

## 2022-12-04 DIAGNOSIS — I5032 Chronic diastolic (congestive) heart failure: Secondary | ICD-10-CM | POA: Diagnosis not present

## 2022-12-04 DIAGNOSIS — I130 Hypertensive heart and chronic kidney disease with heart failure and stage 1 through stage 4 chronic kidney disease, or unspecified chronic kidney disease: Secondary | ICD-10-CM | POA: Diagnosis not present

## 2022-12-04 DIAGNOSIS — I350 Nonrheumatic aortic (valve) stenosis: Secondary | ICD-10-CM

## 2022-12-04 DIAGNOSIS — Z7901 Long term (current) use of anticoagulants: Secondary | ICD-10-CM | POA: Diagnosis not present

## 2022-12-04 LAB — ECHOCARDIOGRAM COMPLETE
AR max vel: 1.47 cm2
AV Area VTI: 1.56 cm2
AV Area mean vel: 1.49 cm2
AV Mean grad: 13 mmHg
AV Peak grad: 25 mmHg
Ao pk vel: 2.5 m/s
Area-P 1/2: 3.77 cm2
S' Lateral: 3.6 cm

## 2022-12-04 NOTE — Patient Instructions (Signed)
Medication Instructions:  Your physician recommends that you continue on your current medications as directed. Please refer to the Current Medication list given to you today.  *If you need a refill on your cardiac medications before your next appointment, please call your pharmacy*   Lab Work: NONE If you have labs (blood work) drawn today and your tests are completely normal, you will receive your results only by: MyChart Message (if you have MyChart) OR A paper copy in the mail If you have any lab test that is abnormal or we need to change your treatment, we will call you to review the results.   Testing/Procedures: NONE   Follow-Up: At [facility], you and your health needs are our priority.  As part of our continuing mission to provide you with exceptional heart care, we have created designated Provider Care Teams.  These Care Teams include your primary Cardiologist (physician) and Advanced Practice Providers (APPs -  Physician Assistants and Nurse Practitioners) who all work together to provide you with the care you need, when you need it.  We recommend signing up for the patient portal called "MyChart".  Sign up information is provided on this After Visit Summary.  MyChart is used to connect with patients for Virtual Visits (Telemedicine).  Patients are able to view lab/test results, encounter notes, upcoming appointments, etc.  Non-urgent messages can be sent to your provider as well.   To learn more about what you can do with MyChart, go to [email or web address].    Your next appointment:   6 month(s)  The format for your next appointment:   In Person  Provider:   Brian Munley, MD    Other Instructions   Important Information About Sugar       

## 2022-12-08 ENCOUNTER — Other Ambulatory Visit: Payer: Medicare HMO

## 2022-12-09 ENCOUNTER — Other Ambulatory Visit: Payer: Self-pay | Admitting: Family Medicine

## 2022-12-17 ENCOUNTER — Telehealth: Payer: Self-pay

## 2022-12-17 ENCOUNTER — Other Ambulatory Visit: Payer: Self-pay

## 2022-12-17 MED ORDER — BREZTRI AEROSPHERE 160-9-4.8 MCG/ACT IN AERO: 2.0000 | INHALATION_SPRAY | Freq: Two times a day (BID) | RESPIRATORY_TRACT | 3 refills | Status: DC

## 2022-12-21 ENCOUNTER — Other Ambulatory Visit: Payer: Medicare HMO

## 2022-12-25 ENCOUNTER — Telehealth: Payer: Self-pay

## 2022-12-25 NOTE — Telephone Encounter (Signed)
Patient's daughter, Jenny Reichmann, brought in completed patient assistance application for [place].  Application faxed 9/57/47.

## 2023-01-03 ENCOUNTER — Other Ambulatory Visit: Payer: Self-pay | Admitting: Family Medicine

## 2023-01-04 NOTE — Telephone Encounter (Signed)
Patient is having issues with his patient assistance and they failed to send his Medicare Part D card in by mistake.  He is going to be out of his medication.  We do have two boxes available and they were saved for him to pick-up.

## 2023-01-06 ENCOUNTER — Telehealth: Payer: Self-pay

## 2023-01-06 NOTE — Progress Notes (Signed)
Error

## 2023-01-07 ENCOUNTER — Telehealth: Payer: Self-pay

## 2023-01-07 NOTE — Telephone Encounter (Signed)
Per Felicity Coyer, "Patient approved to receive [place] and novofine needles through [employer] patient assistance, enrollment ends 11/30/2023. "

## 2023-01-10 ENCOUNTER — Other Ambulatory Visit: Payer: Self-pay | Admitting: Family Medicine

## 2023-01-13 ENCOUNTER — Other Ambulatory Visit: Payer: Self-pay | Admitting: Family Medicine

## 2023-01-13 ENCOUNTER — Telehealth: Payer: Self-pay

## 2023-01-13 MED ORDER — APIXABAN 5 MG PO TABS: 5.0000 mg | ORAL_TABLET | Freq: Two times a day (BID) | ORAL | 1 refills | Status: DC

## 2023-01-13 NOTE — Telephone Encounter (Signed)
Patient wife aware patient assistance for tresiba  and needles available for pick up. PO# [phone]

## 2023-01-14 ENCOUNTER — Telehealth: Payer: Self-pay

## 2023-01-14 ENCOUNTER — Other Ambulatory Visit: Payer: Self-pay | Admitting: Family Medicine

## 2023-01-14 MED ORDER — OZEMPIC (1 MG/DOSE) 2 MG/1.5ML ~~LOC~~ SOPN: 1.0000 mg | PEN_INJECTOR | SUBCUTANEOUS | 1 refills | Status: DC

## 2023-01-14 NOTE — Telephone Encounter (Signed)
Patients daughter stated that he will need a rx for Ozempic soon

## 2023-01-14 NOTE — Telephone Encounter (Signed)
Patients daughter picked up patient assistance

## 2023-01-14 NOTE — Telephone Encounter (Signed)
Sent.Dr. Tobie Poet

## 2023-01-18 DIAGNOSIS — G894 Chronic pain syndrome: Secondary | ICD-10-CM | POA: Diagnosis not present

## 2023-01-18 DIAGNOSIS — Z79899 Other long term (current) drug therapy: Secondary | ICD-10-CM | POA: Diagnosis not present

## 2023-01-18 DIAGNOSIS — M533 Sacrococcygeal disorders, not elsewhere classified: Secondary | ICD-10-CM | POA: Diagnosis not present

## 2023-01-18 DIAGNOSIS — M961 Postlaminectomy syndrome, not elsewhere classified: Secondary | ICD-10-CM | POA: Diagnosis not present

## 2023-01-20 ENCOUNTER — Telehealth: Payer: Self-pay

## 2023-01-20 NOTE — Telephone Encounter (Signed)
Called patient made [name] aware that patient Shawn Sullivan is here for pick up.

## 2023-01-25 ENCOUNTER — Other Ambulatory Visit: Payer: Self-pay | Admitting: Family Medicine

## 2023-01-25 MED ORDER — OZEMPIC (1 MG/DOSE) 2 MG/1.5ML ~~LOC~~ SOPN: 1.0000 mg | PEN_INJECTOR | SUBCUTANEOUS | 3 refills | Status: DC

## 2023-02-02 ENCOUNTER — Telehealth: Payer: Self-pay

## 2023-02-02 NOTE — Telephone Encounter (Addendum)
Patient was notified that information was faxed.  He is needing a form for [place].    ----- Message from Juliane Poot sent at 01/27/2023 12:31 PM EST ----- Regarding: Non THN Good afternoon,  [employer] to check on status of Ozempic which was on patients original re-enrollment application, spoke with representative Raquel Sarna, she informed me that they do not see Ozempic on page 5 of application only [place] and Novofine needles. Advised that I could just fax page 5 with Ozempic and they will process for delivery. Faxed page 5 of application for Ozempic 1 mg, could someone please call the patient to inform that it will take 10-14 business days for medication to arrive to the office.   Thank you  Pattricia Boss, [facility] Pharmacist Assistant [phone]

## 2023-02-08 ENCOUNTER — Other Ambulatory Visit: Payer: Self-pay | Admitting: Family Medicine

## 2023-02-08 NOTE — Telephone Encounter (Signed)
Application for Ozempic faxed again to [employer].

## 2023-02-09 ENCOUNTER — Other Ambulatory Visit: Payer: Self-pay | Admitting: Family Medicine

## 2023-02-15 ENCOUNTER — Ambulatory Visit (INDEPENDENT_AMBULATORY_CARE_PROVIDER_SITE_OTHER): Payer: Medicare HMO | Admitting: Family Medicine

## 2023-02-15 ENCOUNTER — Encounter: Payer: Self-pay | Admitting: Family Medicine

## 2023-02-15 VITALS — BP 124/76 | HR 64 | Temp 96.8°F | Ht 71.0 in | Wt 320.0 lb

## 2023-02-15 DIAGNOSIS — N1832 Chronic kidney disease, stage 3b: Secondary | ICD-10-CM | POA: Diagnosis not present

## 2023-02-15 DIAGNOSIS — K219 Gastro-esophageal reflux disease without esophagitis: Secondary | ICD-10-CM | POA: Diagnosis not present

## 2023-02-15 DIAGNOSIS — I5041 Acute combined systolic (congestive) and diastolic (congestive) heart failure: Secondary | ICD-10-CM | POA: Diagnosis not present

## 2023-02-15 DIAGNOSIS — Z794 Long term (current) use of insulin: Secondary | ICD-10-CM | POA: Diagnosis not present

## 2023-02-15 DIAGNOSIS — G4733 Obstructive sleep apnea (adult) (pediatric): Secondary | ICD-10-CM

## 2023-02-15 DIAGNOSIS — E0842 Diabetes mellitus due to underlying condition with diabetic polyneuropathy: Secondary | ICD-10-CM | POA: Diagnosis not present

## 2023-02-15 DIAGNOSIS — I482 Chronic atrial fibrillation, unspecified: Secondary | ICD-10-CM

## 2023-02-15 DIAGNOSIS — D6869 Other thrombophilia: Secondary | ICD-10-CM

## 2023-02-15 DIAGNOSIS — J410 Simple chronic bronchitis: Secondary | ICD-10-CM

## 2023-02-15 DIAGNOSIS — I130 Hypertensive heart and chronic kidney disease with heart failure and stage 1 through stage 4 chronic kidney disease, or unspecified chronic kidney disease: Secondary | ICD-10-CM

## 2023-02-15 DIAGNOSIS — E782 Mixed hyperlipidemia: Secondary | ICD-10-CM

## 2023-02-15 NOTE — Patient Instructions (Signed)
Speak with Pharmacist about RSV, Shingrix and Tdap vaccines.

## 2023-02-15 NOTE — Progress Notes (Signed)
Subjective:  Patient ID: Shawn Sullivan, male    DOB: 11/30/1946  Age: 77 y.o. MRN: [id number]  Chief Complaint  Patient presents with   Diabetes   Hyperlipidemia   Hypertension    HPI Diabetes:  Complications: neuropathy.  Glucose checking: every 2-3 days Glucose logs: 116-265 fasting. Hypoglycemia: no Most recent A1C: 7.4 Current medications: farxiga 10 mg daily, tresiba 52-56 U daily. Taking ozempic 1 mg weekly. (Has been out of for 3 weeks, waiting on PAP) Last Eye Exam: due for an appt.  Foot checks: continues to check daily.    Hyperlipidemia: Current medications: crestor 40 mg daily and coenzyme q10.    Hypertension: Complications: CKD. Kidney function stable.  Current medications: on metoprolol tartrate 50 mg three times a day, torsemide 20 mg 2 po twice daily, metolazone 5 mg once a wk as needed swelling. and potassium 20 meq three daily.  Medications: checked and unchanged. Swelling has improved.    Atrial fibrillation: on amiodarone 200 mg daily, metoprolol 50 mg three times a day and eliquis 5 mg twice daily. Dr. Bettina Gavia gave good report.   RLS: on requip 0.5 mg once daily as needed.  Chronic pain syndrome: Sees pain clinic.   Mild recurrent depression: on zoloft 50 mg twice daily   GERD: on omeprazole 20 mg 1 pill  oral twice daily   COPD: Breztri 2 puffs twice daily. Using duoneb treatments/albuterol hfa once a day a most.  .also on singulair and xyzal prn   Diet: not healthy. Eating late at night. exercise: walking some.        02/15/2023   11:23 AM 11/09/2022   11:16 AM 08/20/2022   12:01 PM 07/16/2022    9:47 AM 12/24/2021   11:39 AM  Depression screen PHQ 2/9  Decreased Interest 0 0 0 0 0  Down, Depressed, Hopeless 0 0 0 0 0  PHQ - 2 Score 0 0 0 0 0  Altered sleeping 0  3 3   Tired, decreased energy 0  3 3   Change in appetite 0  3 3   Feeling bad or failure about yourself  0  0 0   Trouble concentrating 0  0 0   Moving slowly or  fidgety/restless 0  0 0   Suicidal thoughts 0  0 0   PHQ-9 Score 0  9 9   Difficult doing work/chores Not difficult at all  Somewhat difficult Somewhat difficult          04/02/2022   11:23 AM 07/16/2022    9:50 AM 08/20/2022   12:01 PM 11/09/2022   11:15 AM 02/15/2023   11:22 AM  Fall Risk  Falls in the past year? 1 1 1  0 0  Was there an injury with Fall? 1 0 0 0 0  Fall Risk Category Calculator 3 2 2  0 0  Fall Risk Category (Retired) High Moderate Moderate Low   (RETIRED) Patient Fall Risk Level  Moderate fall risk Moderate fall risk Low fall risk   Patient at Risk for Falls Due to  History of fall(s);Orthopedic patient Orthopedic patient;Impaired balance/gait No Fall Risks No Fall Risks  Fall risk Follow up Falls evaluation completed Falls evaluation completed;Education provided Falls evaluation completed;Falls prevention discussed Falls evaluation completed Falls evaluation completed      Review of Systems  Constitutional:  Negative for chills, diaphoresis, fatigue and fever.  HENT:  Positive for congestion. Negative for ear pain and sore throat.   Respiratory:  Negative  for cough and shortness of breath.   Cardiovascular:  Negative for chest pain and leg swelling.  Gastrointestinal:  Negative for abdominal pain, constipation, diarrhea, nausea and vomiting.  Genitourinary:  Negative for dysuria and urgency.  Musculoskeletal:  Positive for arthralgias and back pain. Negative for myalgias.  Neurological:  Negative for dizziness and headaches.  Psychiatric/Behavioral:  Negative for dysphoric mood.     Current Outpatient Medications on File Prior to Visit  Medication Sig Dispense Refill   acetaminophen (TYLENOL) 500 MG tablet Take 1,000 mg by mouth every 6 (six) hours as needed for moderate pain or headache.     amiodarone (PACERONE) 200 MG tablet Take 1 tablet (200 mg total) by mouth every evening. 90 tablet 3   apixaban (ELIQUIS) 5 MG TABS tablet Take 1 tablet (5 mg total) by  mouth 2 (two) times daily. 60 tablet 1   BD PEN NEEDLE MICRO U/F 32G X 6 MM MISC SMARTSIG:1 Needle SUB-Q Daily     Budeson-Glycopyrrol-Formoterol (BREZTRI AEROSPHERE) 160-9-4.8 MCG/ACT AERO Inhale 2 puffs into the lungs 2 (two) times daily. 10.7 g 3   cholecalciferol (VITAMIN D3) 25 MCG (1000 UT) tablet Take 1,000 Units by mouth daily.      dapagliflozin propanediol (FARXIGA) 10 MG TABS tablet Take 1 tablet (10 mg total) by mouth daily before breakfast. 90 tablet 3   folic acid (FOLVITE) [id number] MCG tablet Take 800 mcg by mouth daily.     HYDROcodone-acetaminophen (NORCO) 10-325 MG tablet Take 1 tablet by mouth every 4 (four) hours as needed for moderate pain.     insulin degludec (TRESIBA FLEXTOUCH) 100 UNIT/ML FlexTouch Pen Inject 56 Units into the skin daily.     ipratropium-albuterol (DUONEB) 0.5-2.5 (3) MG/3ML SOLN USE 1 VIAL IN NEBULIZER EVERY 6 HOURS AS NEEDED FOR SHORTNESS OF BREATH 90 mL 0   levocetirizine (XYZAL) 5 MG tablet Take 5 mg by mouth daily as needed for allergies.     metolazone (ZAROXOLYN) 5 MG tablet Take 5 mg by mouth once a week. Uses prn if swollen once weekly.     metoprolol tartrate (LOPRESSOR) 50 MG tablet TAKE 1 TABLET BY MOUTH IN THE MORNING AND 1 AT NOON AND 1 AT BEDTIME 270 tablet 3   montelukast (SINGULAIR) 10 MG tablet TAKE ONE TABLET BY MOUTH EVERYDAY AT BEDTIME 90 tablet 1   Omeprazole 20 MG TBEC Take 20 mg by mouth 2 (two) times daily before a meal.      potassium chloride SA (KLOR-CON M) 20 MEQ tablet Take 60 mEq by mouth 3 (three) times daily.     rOPINIRole (REQUIP) 0.5 MG tablet TAKE 1 TABLET BY MOUTH AT BEDTIME 30 tablet 2   rosuvastatin (CRESTOR) 40 MG tablet TAKE ONE TABLET BY MOUTH EVERYDAY AT BEDTIME 90 tablet 0   Semaglutide, 1 MG/DOSE, (OZEMPIC, 1 MG/DOSE,) 2 MG/1.5ML SOPN Inject 1 mg into the skin once a week. 9 mL 3   sertraline (ZOLOFT) 50 MG tablet TAKE ONE TABLET BY MOUTH AT BREAKFAST AND AT BEDTIME 180 tablet 0   tamsulosin (FLOMAX) 0.4 MG CAPS  capsule TAKE ONE CAPSULE BY MOUTH EVERYDAY AT BEDTIME 90 capsule 1   tiZANidine (ZANAFLEX) 2 MG tablet TAKE ONE TABLET BY MOUTH EVERYDAY AT BEDTIME 30 tablet 2   torsemide (DEMADEX) 20 MG tablet TAKE TWO TABLETS BY MOUTH EVERY MORNING and TAKE TWO TABLETS BY MOUTH AT NOON 180 tablet 1   VENTOLIN HFA 108 (90 Base) MCG/ACT inhaler INHALE 2 PUFFS BY MOUTH EVERY 4  HOURS AS NEEDED FOR WHEEZING OR SHORTNESS OF BREATH 18 g 0   No current facility-administered medications on file prior to visit.   Past Medical History:  Diagnosis Date   Anticoagulated 07/01/2018   Anticoagulated 07/01/2018   Benign essential hypertension 05/06/2013   Chronic obstructive pulmonary disease ([place]) 11/05/2015   COPD (chronic obstructive pulmonary disease) ([place]) 07/01/2018   Dermatochalasis 12/16/2015   Disorder of bursae and tendons in shoulder region 01/05/2014   Generalized edema 06/22/2016   Hyperlipidemia 12/16/2015   Hypertensive heart disease 07/01/2018   Low back pain 08/17/2013   Lumbosacral spondylosis 05/06/2013   Obstructive sleep apnea 07/01/2018   Persistent atrial fibrillation ([place]) 07/01/2018   Piriformis syndrome 05/03/2014   Postlaminectomy syndrome, lumbar region 05/06/2013   Presbyopia of both eyes 12/16/2015   Restless legs syndrome 03/23/2016   Restrictive lung disease 06/22/2016   Sleep apnea 05/06/2013   Type 2 diabetes mellitus without complications ([place]) [id number]   Past Surgical History:  Procedure Laterality Date   APPENDECTOMY     BACK SURGERY     CARDIOVERSION N/A 12/01/2018   Procedure: CARDIOVERSION;  Surgeon: Sanda Klein, MD;  Location: [facility];  Service: Cardiovascular;  Laterality: N/A;   CATARACT EXTRACTION Bilateral    LUMBAR FUSION     NECK SURGERY      Family History  Problem Relation Age of Onset   Atrial fibrillation Mother    Cancer Mother    Heart disease Father    Social History   Socioeconomic History   Marital status: Married    Spouse name: Not on file   Number of children:  Not on file   Years of education: Not on file   Highest education level: Not on file  Occupational History   Not on file  Tobacco Use   Smoking status: Former    Types: Cigarettes    Quit date: 1993    Years since quitting: 31.2   Smokeless tobacco: Never  Vaping Use   Vaping Use: Never used  Substance and Sexual Activity   Alcohol use: Not Currently   Drug use: Not Currently    Types: Hydrocodone   Sexual activity: Not on file  Other Topics Concern   Not on file  Social History Narrative   Not on file   Social Determinants of Health   Financial Resource Strain: High Risk (08/04/2022)   Overall Financial Resource Strain (CARDIA)    Difficulty of Paying Living Expenses: Very hard  Food Insecurity: No Food Insecurity (07/09/2020)   Hunger Vital Sign    Worried About Running Out of Food in the Last Year: Never true    Ran Out of Food in the Last Year: Never true  Transportation Needs: No Transportation Needs (02/03/2022)   PRAPARE - Hydrologist (Medical): No    Lack of Transportation (Non-Medical): No  Physical Activity: Inactive (08/04/2022)   Exercise Vital Sign    Days of Exercise per Week: 0 days    Minutes of Exercise per Session: 0 min  Stress: No Stress Concern Present (02/15/2023)   [place]    Feeling of Stress : Not at all  Social Connections: Moderately Integrated (02/15/2023)   Social Connection and Isolation Panel [NHANES]    Frequency of Communication with Friends and Family: Three times a week    Frequency of Social Gatherings with Friends and Family: Three times a week    Attends Religious Services: More  than 4 times per year    Active Member of Clubs or Organizations: No    Attends Archivist Meetings: Never    Marital Status: Married    Objective:  BP 124/76   Pulse 64   Temp (!) 96.8 F (36 C)   Ht 5\' 11"  (1.803 m)   Wt (!) 320 lb (145.2 kg)    SpO2 98%   BMI 44.63 kg/m      02/15/2023   11:04 AM 12/04/2022    1:10 PM 11/09/2022   11:11 AM  BP/Weight  Systolic BP [id number]  Diastolic BP 76 72 60  Wt. (Lbs) 320 317 309  BMI 44.63 kg/m2 44.21 kg/m2 43.1 kg/m2    Physical Exam Vitals reviewed.  Constitutional:      Appearance: Normal appearance. He is normal weight.  Cardiovascular:     Rate and Rhythm: Normal rate and regular rhythm.     Heart sounds: No murmur heard. Pulmonary:     Effort: Pulmonary effort is normal.     Breath sounds: Normal breath sounds.  Abdominal:     General: Abdomen is flat. Bowel sounds are normal.     Palpations: Abdomen is soft.     Tenderness: There is no abdominal tenderness.  Skin:    Comments: Wart left anterior lower leg  Neurological:     Mental Status: He is alert and oriented to person, place, and time.  Psychiatric:        Mood and Affect: Mood normal.        Behavior: Behavior normal.     Diabetic Foot Exam - Simple   Simple Foot Form Diabetic Foot exam was performed with the following findings: Yes 02/15/2023 11:42 AM  Visual Inspection No deformities, no ulcerations, no other skin breakdown bilaterally: Yes Sensation Testing Intact to touch and monofilament testing bilaterally: Yes Pulse Check Posterior Tibialis and Dorsalis pulse intact bilaterally: Yes Comments      Lab Results  Component Value Date   WBC 10.8 11/09/2022   HGB 16.7 11/09/2022   HCT 51.5 (H) 11/09/2022   PLT 158 11/09/2022   GLUCOSE 132 (H) 11/09/2022   CHOL 168 11/09/2022   TRIG 194 (H) 11/09/2022   HDL 38 (L) 11/09/2022   LDLCALC 97 11/09/2022   ALT 21 11/09/2022   AST 26 11/09/2022   NA 142 11/09/2022   K 3.2 (L) 11/09/2022   CL 93 (L) 11/09/2022   CREATININE 2.11 (H) 11/09/2022   BUN 28 (H) 11/09/2022   CO2 28 11/09/2022   TSH 3.790 11/09/2022   INR 1.1 11/25/2018   HGBA1C 7.4 (H) 11/09/2022   MICROALBUR 80 01/14/2021      Assessment & Plan:    Atrial fibrillation,  chronic ([place]) Assessment & Plan: The current medical regimen is effective;  continue present plan and medications. Continue amiodarone 200 mg daily, metoprolol 50 mg three times a day and eliquis 5 mg twice daily.  Management per specialist.     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  No changes to medicines. Continue on metoprolol tartrate 50 mg three times a day, torsemide 20 mg 2 po twice daily, metolazone 5 mg once a wk as needed swelling. Continue to work on eating a healthy diet and exercise.  Labs drawn today.    Orders: -     Comprehensive metabolic panel; Future  Diabetes  mellitus due to underlying condition, controlled, with diabetic polyneuropathy, with long-term current use of insulin ([place]) Assessment & Plan: Control: not at goal Recommend check sugars fasting daily. Recommend check feet daily. Recommend annual eye exams. Medicines: Continue : farxiga 10 mg daily, tresiba 52-56 U daily.Taking ozempic 1 mg weekly.    Continue to work on eating a healthy diet and exercise.  Labs drawn today.     Orders: -     Microalbumin / creatinine urine ratio -     CBC with Differential/Platelet; Future -     Hemoglobin A1c; Future -     ACTH; Future -     Cortisol-am, blood; Future -     DHEA-Sulfate, Serum; Future  Obstructive sleep apnea Assessment & Plan: Continue CPAP.   Gastroesophageal reflux disease without esophagitis Assessment & Plan: The current medical regimen is effective;  continue present plan and medications. Continue omeprazole 20 mg 1 pill oral twice daily     Mixed hyperlipidemia Assessment & Plan: Well controlled.  No changes to medicines. Continue crestor 40 mg before bed and coenzyme q10 daily.  Continue to work on eating a healthy diet and exercise.  Labs drawn today.    Orders: -     Lipid  panel; Future  Acquired thrombophilia ([place]) Assessment & Plan: Secondary to eliquis.   Simple chronic bronchitis ([facility]) Assessment & Plan: Continue Breztri 2 puffs twice daily. Using duoneb treatments/albuterol hfa once a day a most.  .also on singulair and xyzal prn      No orders of the defined types were placed in this encounter.   Orders Placed This Encounter  Procedures   Microalbumin / creatinine urine ratio   CBC with Differential/Platelet   Comprehensive metabolic panel   Hemoglobin A1c   Lipid panel   ACTH   Cortisol-am, blood   DHEA-Sulfate, Serum     Follow-up: Return in about 4 months (around 06/17/2023) for chronic, fasting.  I,Marla I Leal-Borjas,acting as a scribe for Rochel Brome, MD.,have documented all relevant documentation on the behalf of Rochel Brome, MD,as directed by  Rochel Brome, MD while in the presence of Rochel Brome, MD.     An After Visit Summary was printed and given to the patient.  I attest that I have reviewed this visit and agree with the plan scribed by my staff.   Rochel Brome, MD [facility] [phone]

## 2023-02-16 LAB — MICROALBUMIN / CREATININE URINE RATIO
Creatinine, Urine: 64.1 mg/dL
Microalb/Creat Ratio: 863 mg/g creat — ABNORMAL HIGH (ref 0–29)
Microalbumin, Urine: 553.1 ug/mL

## 2023-02-18 NOTE — Assessment & Plan Note (Addendum)
Control: not at goal Recommend check sugars fasting daily. Recommend check feet daily. Recommend annual eye exams. Medicines: Continue : farxiga 10 mg daily, tresiba 52-56 U daily.Taking ozempic 1 mg weekly.    Continue to work on eating a healthy diet and exercise.  Labs drawn today.

## 2023-02-18 NOTE — Assessment & Plan Note (Signed)
Continue CPAP.  

## 2023-02-18 NOTE — Assessment & Plan Note (Addendum)
The current medical regimen is effective;  continue present plan and medications. Continue omeprazole 20 mg 1 pill oral twice daily

## 2023-02-18 NOTE — Assessment & Plan Note (Addendum)
The current medical regimen is effective;  continue present plan and medications. Continue amiodarone 200 mg daily, metoprolol 50 mg three times a day and eliquis 5 mg twice daily.  Management per specialist.

## 2023-02-18 NOTE — Assessment & Plan Note (Signed)
Well controlled.  No changes to medicines. Continue crestor 40 mg before bed and coenzyme q10 daily.  Continue to work on eating a healthy diet and exercise.  Labs drawn today.   

## 2023-02-18 NOTE — Assessment & Plan Note (Signed)
Well controlled.  No changes to medicines. Continue on metoprolol tartrate 50 mg three times a day, torsemide 20 mg 2 po twice daily, metolazone 5 mg once a wk as needed swelling. Continue to work on eating a healthy diet and exercise.  Labs drawn today.

## 2023-02-21 NOTE — Assessment & Plan Note (Signed)
Continue Breztri 2 puffs twice daily. Using duoneb treatments/albuterol hfa once a day a most.  .also on singulair and xyzal prn

## 2023-02-21 NOTE — Assessment & Plan Note (Signed)
Secondary to eliquis. ?

## 2023-02-22 ENCOUNTER — Other Ambulatory Visit: Payer: Self-pay | Admitting: Family Medicine

## 2023-02-22 ENCOUNTER — Other Ambulatory Visit: Payer: Medicare HMO

## 2023-02-22 DIAGNOSIS — I130 Hypertensive heart and chronic kidney disease with heart failure and stage 1 through stage 4 chronic kidney disease, or unspecified chronic kidney disease: Secondary | ICD-10-CM | POA: Diagnosis not present

## 2023-02-22 DIAGNOSIS — Z794 Long term (current) use of insulin: Secondary | ICD-10-CM | POA: Diagnosis not present

## 2023-02-22 DIAGNOSIS — E782 Mixed hyperlipidemia: Secondary | ICD-10-CM

## 2023-02-22 DIAGNOSIS — I5041 Acute combined systolic (congestive) and diastolic (congestive) heart failure: Secondary | ICD-10-CM | POA: Diagnosis not present

## 2023-02-22 DIAGNOSIS — N1832 Chronic kidney disease, stage 3b: Secondary | ICD-10-CM | POA: Diagnosis not present

## 2023-02-22 DIAGNOSIS — E0842 Diabetes mellitus due to underlying condition with diabetic polyneuropathy: Secondary | ICD-10-CM | POA: Diagnosis not present

## 2023-02-28 LAB — DHEA-SULFATE, SERUM: DHEA-Sulfate, LCMS: <10 ug/dL

## 2023-02-28 LAB — CBC WITH DIFFERENTIAL/PLATELET
Basophils Absolute: 0 10*3/uL (ref 0.0–0.2)
Basos: 0 %
EOS (ABSOLUTE): 0.1 10*3/uL (ref 0.0–0.4)
Eos: 1 %
Hematocrit: 42 % (ref 37.5–51.0)
Hemoglobin: 13.4 g/dL (ref 13.0–17.7)
Immature Grans (Abs): 0 10*3/uL (ref 0.0–0.1)
Immature Granulocytes: 0 %
Lymphocytes Absolute: 1.5 10*3/uL (ref 0.7–3.1)
Lymphs: 17 %
MCH: 29.4 pg (ref 26.6–33.0)
MCHC: 31.9 g/dL (ref 31.5–35.7)
MCV: 92 fL (ref 79–97)
Monocytes Absolute: 0.5 10*3/uL (ref 0.1–0.9)
Monocytes: 6 %
Neutrophils Absolute: 6.6 10*3/uL (ref 1.4–7.0)
Neutrophils: 76 %
Platelets: 119 10*3/uL — ABNORMAL LOW (ref 150–450)
RBC: 4.56 x10E6/uL (ref 4.14–5.80)
RDW: 13.5 % (ref 11.6–15.4)
WBC: 8.7 10*3/uL (ref 3.4–10.8)

## 2023-02-28 LAB — COMPREHENSIVE METABOLIC PANEL
ALT: 33 IU/L (ref 0–44)
AST: 39 IU/L (ref 0–40)
Albumin/Globulin Ratio: 1.7 (ref 1.2–2.2)
Albumin: 4.1 g/dL (ref 3.8–4.8)
Alkaline Phosphatase: 123 IU/L — ABNORMAL HIGH (ref 44–121)
BUN/Creatinine Ratio: 12 (ref 10–24)
BUN: 20 mg/dL (ref 8–27)
Bilirubin Total: 1 mg/dL (ref 0.0–1.2)
CO2: 25 mmol/L (ref 20–29)
Calcium: 9.7 mg/dL (ref 8.6–10.2)
Chloride: 97 mmol/L (ref 96–106)
Creatinine, Ser: 1.63 mg/dL — ABNORMAL HIGH (ref 0.76–1.27)
Globulin, Total: 2.4 g/dL (ref 1.5–4.5)
Glucose: 171 mg/dL — ABNORMAL HIGH (ref 70–99)
Potassium: 4.5 mmol/L (ref 3.5–5.2)
Sodium: 139 mmol/L (ref 134–144)
Total Protein: 6.5 g/dL (ref 6.0–8.5)
eGFR: 43 mL/min/{1.73_m2} — ABNORMAL LOW (ref >59)

## 2023-02-28 LAB — HEMOGLOBIN A1C
Est. average glucose Bld gHb Est-mCnc: 169 mg/dL
Hgb A1c MFr Bld: 7.5 % — ABNORMAL HIGH (ref 4.8–5.6)

## 2023-02-28 LAB — LIPID PANEL
Chol/HDL Ratio: 3.8 ratio (ref 0.0–5.0)
Cholesterol, Total: 163 mg/dL (ref 100–199)
HDL: 43 mg/dL (ref >39)
LDL Chol Calc (NIH): 97 mg/dL (ref 0–99)
Triglycerides: 126 mg/dL (ref 0–149)
VLDL Cholesterol Cal: 23 mg/dL (ref 5–40)

## 2023-02-28 LAB — CORTISOL-AM, BLOOD: Cortisol - AM: 1.3 ug/dL — ABNORMAL LOW (ref 6.2–19.4)

## 2023-02-28 LAB — CARDIOVASCULAR RISK ASSESSMENT

## 2023-02-28 LAB — ACTH: ACTH: <1.5 pg/mL — ABNORMAL LOW (ref 7.2–63.3)

## 2023-03-02 ENCOUNTER — Other Ambulatory Visit: Payer: Self-pay

## 2023-03-02 DIAGNOSIS — R7989 Other specified abnormal findings of blood chemistry: Secondary | ICD-10-CM

## 2023-03-04 ENCOUNTER — Telehealth: Payer: Self-pay

## 2023-03-04 NOTE — Telephone Encounter (Addendum)
Patient made aware of information, stated he will have Hassan Rowan bring him by to sign paperwork.   ----- Message from Juliane Poot sent at 03/04/2023  1:13 PM EDT ----- Regarding: Non THN/ACO Hi again, patient brought out of pocket expense report from pharmacy and I am faxing it with his [facility] application, I do not think it going to be enough for approval, he has to have paid at least $600 out of pocket and right now he is at $263. But I will send to get the process started, also a [employer] application was brought in but patient forgot to signed HIPAA authorization on page 3, I am leaving at the front if someone could give him a call please.   Thank you  Pattricia Boss, [place] Pharmacist Assistant [phone]

## 2023-03-07 ENCOUNTER — Other Ambulatory Visit: Payer: Self-pay | Admitting: Family Medicine

## 2023-03-15 ENCOUNTER — Other Ambulatory Visit: Payer: Medicare HMO

## 2023-03-15 DIAGNOSIS — D631 Anemia in chronic kidney disease: Secondary | ICD-10-CM | POA: Diagnosis not present

## 2023-03-15 DIAGNOSIS — N2581 Secondary hyperparathyroidism of renal origin: Secondary | ICD-10-CM | POA: Diagnosis not present

## 2023-03-15 DIAGNOSIS — E876 Hypokalemia: Secondary | ICD-10-CM | POA: Diagnosis not present

## 2023-03-15 DIAGNOSIS — E1122 Type 2 diabetes mellitus with diabetic chronic kidney disease: Secondary | ICD-10-CM | POA: Diagnosis not present

## 2023-03-15 DIAGNOSIS — E669 Obesity, unspecified: Secondary | ICD-10-CM | POA: Diagnosis not present

## 2023-03-15 DIAGNOSIS — R600 Localized edema: Secondary | ICD-10-CM | POA: Diagnosis not present

## 2023-03-15 DIAGNOSIS — I129 Hypertensive chronic kidney disease with stage 1 through stage 4 chronic kidney disease, or unspecified chronic kidney disease: Secondary | ICD-10-CM | POA: Diagnosis not present

## 2023-03-15 DIAGNOSIS — N1832 Chronic kidney disease, stage 3b: Secondary | ICD-10-CM | POA: Diagnosis not present

## 2023-03-15 DIAGNOSIS — E559 Vitamin D deficiency, unspecified: Secondary | ICD-10-CM | POA: Diagnosis not present

## 2023-03-16 ENCOUNTER — Telehealth: Payer: Self-pay

## 2023-03-16 NOTE — Telephone Encounter (Signed)
(  Copied from staff message)  Demetrios Loll [facility] Clinical Novo Nordisk patient assistance program notification:  120- day supply of Tresiba and Novofine needles will be filled on 04/03/2023 and should arrive to the office in 10-14 business days. Patient enrollment will expire on 11/30/2023.    Billee Cashing, CMA Clinical Pharmacist Assistant [phone]

## 2023-03-17 ENCOUNTER — Other Ambulatory Visit: Payer: Self-pay | Admitting: Family Medicine

## 2023-03-18 ENCOUNTER — Other Ambulatory Visit: Payer: Self-pay

## 2023-03-18 NOTE — Telephone Encounter (Signed)
Per [name]: [employer] patient assistance program notification:   120- day supply of Tresiba and Novofine needles will be filled on 04/03/2023 and should arrive to the office in 10-14 business days. Patient enrollment will expire on 11/30/2023.   Another notification came in stating Shawn Sullivan will be filled on 04/10/2023. Shipment might have been split.

## 2023-03-22 ENCOUNTER — Other Ambulatory Visit: Payer: Medicare HMO

## 2023-03-22 DIAGNOSIS — R799 Abnormal finding of blood chemistry, unspecified: Secondary | ICD-10-CM | POA: Diagnosis not present

## 2023-03-22 LAB — CBC WITH DIFFERENTIAL/PLATELET
Basophils Absolute: 0.1 10*3/uL (ref 0.0–0.2)
Basos: 1 %
EOS (ABSOLUTE): 0.1 10*3/uL (ref 0.0–0.4)
Eos: 2 %
Hematocrit: 43 % (ref 37.5–51.0)
Hemoglobin: 13.3 g/dL (ref 13.0–17.7)
Immature Grans (Abs): 0 10*3/uL (ref 0.0–0.1)
Immature Granulocytes: 0 %
Lymphocytes Absolute: 1.9 10*3/uL (ref 0.7–3.1)
Lymphs: 22 %
MCH: 28.1 pg (ref 26.6–33.0)
MCHC: 30.9 g/dL — ABNORMAL LOW (ref 31.5–35.7)
MCV: 91 fL (ref 79–97)
Monocytes Absolute: 0.6 10*3/uL (ref 0.1–0.9)
Monocytes: 7 %
Neutrophils Absolute: 5.8 10*3/uL (ref 1.4–7.0)
Neutrophils: 68 %
Platelets: 164 10*3/uL (ref 150–450)
RBC: 4.74 x10E6/uL (ref 4.14–5.80)
RDW: 13.2 % (ref 11.6–15.4)
WBC: 8.6 10*3/uL (ref 3.4–10.8)

## 2023-03-24 ENCOUNTER — Other Ambulatory Visit: Payer: Self-pay

## 2023-03-24 MED ORDER — OZEMPIC (1 MG/DOSE) 2 MG/1.5ML ~~LOC~~ SOPN: 1.0000 mg | PEN_INJECTOR | SUBCUTANEOUS | 3 refills | Status: DC

## 2023-03-24 NOTE — Telephone Encounter (Signed)
Printed script for refill to be hand faxed to patient assistance for ozempic which is novo nordisk fax # [phone]

## 2023-04-04 ENCOUNTER — Other Ambulatory Visit: Payer: Self-pay | Admitting: Family Medicine

## 2023-04-08 DIAGNOSIS — Z6841 Body Mass Index (BMI) 40.0 and over, adult: Secondary | ICD-10-CM | POA: Diagnosis not present

## 2023-04-08 DIAGNOSIS — J018 Other acute sinusitis: Secondary | ICD-10-CM | POA: Diagnosis not present

## 2023-04-08 DIAGNOSIS — E119 Type 2 diabetes mellitus without complications: Secondary | ICD-10-CM | POA: Diagnosis not present

## 2023-04-09 ENCOUNTER — Telehealth: Payer: Self-pay

## 2023-04-09 NOTE — Telephone Encounter (Signed)
-----   Message from Daniel Nones sent at 04/08/2023  3:31 PM EDT ----- Regarding: Non THN/ACO [employer] patient assistance program notification:  120- day supply of Tresiba and Novofine needles  should arrive to the office in 10-14 business days. Patient has 1  refill remaining and enrollment will expire on 11/30/2023. Next refill will be fulfilled on 06/25/2023.    Billee Cashing, CMA Clinical Pharmacist Assistant [phone]

## 2023-04-09 NOTE — Telephone Encounter (Signed)
Patient made aware of Shawn Sullivan note below

## 2023-04-13 ENCOUNTER — Other Ambulatory Visit: Payer: Self-pay

## 2023-04-13 ENCOUNTER — Telehealth: Payer: Self-pay

## 2023-04-13 MED ORDER — APIXABAN 5 MG PO TABS: 5.0000 mg | ORAL_TABLET | Freq: Two times a day (BID) | ORAL | 0 refills | Status: DC

## 2023-04-13 NOTE — Telephone Encounter (Signed)
Patient called stating that they got in touch with Novo patient assistance and they said that the last paperwork they received for the Ozempic did not have provider information on it. They wanted [place] to send a message to let yall know because they stated they had called and left messages but didn't receive a call back.

## 2023-04-15 ENCOUNTER — Telehealth: Payer: Self-pay

## 2023-04-15 NOTE — Telephone Encounter (Signed)
-----   Message from Daniel Nones sent at 04/15/2023  3:26 PM EDT ----- Regarding: NON ACO/THN Novo Nordisk patient assistance program notification:  120- day supply of Tresiba and Novofine needles should arrive to the office in 10-14 business days. Patient has 1  refill remaining and enrollment will expire on 11/30/2023.  Next refill will be fulfilled on 07/02/2023.   Billee Cashing, CMA Clinical Pharmacist Assistant [phone]

## 2023-04-20 ENCOUNTER — Telehealth: Payer: Self-pay

## 2023-04-20 NOTE — Telephone Encounter (Signed)
I left message on voicemail to let her know that patient assistance is at the office.

## 2023-04-22 ENCOUNTER — Telehealth: Payer: Self-pay

## 2023-04-22 NOTE — Telephone Encounter (Signed)
PT DAUGHTER PICKED UP PATIENCE ASSISTANCE - SIGNED BLUE FORMS LEFT IN PHARM BOX

## 2023-04-22 NOTE — Telephone Encounter (Signed)
-----   Message from Daniel Nones sent at 04/22/2023  4:08 PM EDT ----- Regarding: Approval- NON THN ACO Patient was approved to receive Ozempic 1 mg through [employer] patient assistance program, enrollment ends 11/30/2023.   Billee Cashing, CMA Clinical Pharmacist Assistant [phone]

## 2023-04-28 ENCOUNTER — Telehealth: Payer: Self-pay

## 2023-04-28 NOTE — Telephone Encounter (Signed)
Notified patient's wife that tresiba and pen needles are ready for pick-up Tresiba NDC [phone] NOVOFINE PEN  Needles Lot # [id number] Exp.  07/31/2027.

## 2023-04-29 DIAGNOSIS — G894 Chronic pain syndrome: Secondary | ICD-10-CM | POA: Diagnosis not present

## 2023-04-29 DIAGNOSIS — M961 Postlaminectomy syndrome, not elsewhere classified: Secondary | ICD-10-CM | POA: Diagnosis not present

## 2023-04-29 DIAGNOSIS — M533 Sacrococcygeal disorders, not elsewhere classified: Secondary | ICD-10-CM | POA: Diagnosis not present

## 2023-04-30 ENCOUNTER — Telehealth: Payer: Self-pay

## 2023-04-30 NOTE — Telephone Encounter (Signed)
Patient assistance received today for Ozempic 1 mg 4 boxes WJX#[id number]. Called patient and left voicemail informing patient it was here for pick up.

## 2023-05-03 ENCOUNTER — Telehealth: Payer: Self-pay | Admitting: Cardiology

## 2023-05-03 NOTE — Telephone Encounter (Signed)
Pt daughter called in asking if pt had any balance or debts owed and if so she wanted to go ahead and pay them for him.

## 2023-05-06 ENCOUNTER — Encounter: Payer: Self-pay | Admitting: Physician Assistant

## 2023-05-06 ENCOUNTER — Ambulatory Visit (INDEPENDENT_AMBULATORY_CARE_PROVIDER_SITE_OTHER): Payer: Medicare HMO | Admitting: Physician Assistant

## 2023-05-06 VITALS — BP 130/60 | HR 79 | Temp 98.0°F | Resp 14 | Ht 71.0 in | Wt 315.0 lb

## 2023-05-06 DIAGNOSIS — N3001 Acute cystitis with hematuria: Secondary | ICD-10-CM | POA: Diagnosis not present

## 2023-05-06 DIAGNOSIS — J069 Acute upper respiratory infection, unspecified: Secondary | ICD-10-CM | POA: Insufficient documentation

## 2023-05-06 DIAGNOSIS — Z1383 Encounter for screening for respiratory disorder NEC: Secondary | ICD-10-CM | POA: Diagnosis not present

## 2023-05-06 HISTORY — DX: Encounter for screening for respiratory disorder NEC: Z13.83

## 2023-05-06 LAB — POCT URINALYSIS DIP (CLINITEK)
Glucose, UA: >=1000 mg/dL — AB
Nitrite, UA: POSITIVE — AB
POC PROTEIN,UA: 30 — AB
Spec Grav, UA: 1.01 (ref 1.010–1.025)
Urobilinogen, UA: 2 E.U./dL — AB
pH, UA: 6.5 (ref 5.0–8.0)

## 2023-05-06 MED ORDER — AMOXICILLIN-POT CLAVULANATE 875-125 MG PO TABS
1.0000 | ORAL_TABLET | Freq: Two times a day (BID) | ORAL | 0 refills | Status: DC
Start: 2023-05-06 — End: 2023-06-02

## 2023-05-06 NOTE — Assessment & Plan Note (Signed)
Prescribed Augmentin to treat UTI

## 2023-05-06 NOTE — Assessment & Plan Note (Signed)
Ordered chest x-ray to rule out pneumonia. Patient will get that done on Monday. We will adjust treatment based on results from the x-ray.

## 2023-05-06 NOTE — Assessment & Plan Note (Signed)
Chest x-ray ordered to rule out pneumonia vs COPD exacerbation.

## 2023-05-06 NOTE — Progress Notes (Signed)
Acute Office Visit  Subjective:    Patient ID: Shawn Sullivan, male    DOB: 20-Jan-1946, 77 y.o.   MRN: [id number]  Chief Complaint  Patient presents with   Urinary Tract Infection    HPI: Patient is in today for UTI since 1 week ago, dysuria and difficulty to empty the bladder. He took pyridium and it did help a little bit. He also mentioned to have pressure on his sinus and pos drainage, dizziness and headache. He denies fever, chills, chest pain. He went to [facility] and they gave antibiotics and steroids, but it did not help.   Past Medical History:  Diagnosis Date   Anticoagulated 07/01/2018   Anticoagulated 07/01/2018   Benign essential hypertension 05/06/2013   Chronic obstructive pulmonary disease (HCC) 11/05/2015   COPD (chronic obstructive pulmonary disease) (HCC) 07/01/2018   Dermatochalasis 12/16/2015   Disorder of bursae and tendons in shoulder region 01/05/2014   Generalized edema 06/22/2016   Hyperlipidemia 12/16/2015   Hypertensive heart disease 07/01/2018   Low back pain 08/17/2013   Lumbosacral spondylosis 05/06/2013   Obstructive sleep apnea 07/01/2018   Persistent atrial fibrillation (HCC) 07/01/2018   Piriformis syndrome 05/03/2014   Postlaminectomy syndrome, lumbar region 05/06/2013   Presbyopia of both eyes 12/16/2015   Restless legs syndrome 03/23/2016   Restrictive lung disease 06/22/2016   Sleep apnea 05/06/2013   Type 2 diabetes mellitus without complications (HCC) 11/05/2015    Past Surgical History:  Procedure Laterality Date   APPENDECTOMY     BACK SURGERY     CARDIOVERSION N/A 12/01/2018   Procedure: CARDIOVERSION;  Surgeon: Thurmon Fair, MD;  Location: [facility];  Service: Cardiovascular;  Laterality: N/A;   CATARACT EXTRACTION Bilateral    LUMBAR FUSION     NECK SURGERY      Family History  Problem Relation Age of Onset   Atrial fibrillation Mother    Cancer Mother    Heart disease Father     Social History   Socioeconomic History    Marital status: Married    Spouse name: Not on file   Number of children: Not on file   Years of education: Not on file   Highest education level: Not on file  Occupational History   Not on file  Tobacco Use   Smoking status: Former    Types: Cigarettes    Quit date: 1993    Years since quitting: 31.4   Smokeless tobacco: Never  Vaping Use   Vaping Use: Never used  Substance and Sexual Activity   Alcohol use: Not Currently   Drug use: Not Currently    Types: Hydrocodone   Sexual activity: Not on file  Other Topics Concern   Not on file  Social History Narrative   Not on file   Social Determinants of Health   Financial Resource Strain: High Risk (08/04/2022)   Overall Financial Resource Strain (CARDIA)    Difficulty of Paying Living Expenses: Very hard  Food Insecurity: No Food Insecurity (07/09/2020)   Hunger Vital Sign    Worried About Running Out of Food in the Last Year: Never true    Ran Out of Food in the Last Year: Never true  Transportation Needs: No Transportation Needs (02/03/2022)   PRAPARE - Administrator, Civil Service (Medical): No    Lack of Transportation (Non-Medical): No  Physical Activity: Inactive (08/04/2022)   Exercise Vital Sign    Days of Exercise per Week: 0 days  Minutes of Exercise per Session: 0 min  Stress: No Stress Concern Present (02/15/2023)   [employer] of Occupational Health - Occupational Stress Questionnaire    Feeling of Stress : Not at all  Social Connections: Moderately Integrated (02/15/2023)   Social Connection and Isolation Panel [NHANES]    Frequency of Communication with Friends and Family: Three times a week    Frequency of Social Gatherings with Friends and Family: Three times a week    Attends Religious Services: More than 4 times per year    Active Member of Clubs or Organizations: No    Attends Banker Meetings: Never    Marital Status: Married  Catering manager Violence: Not At Risk  (02/15/2023)   Humiliation, Afraid, Rape, and Kick questionnaire    Fear of Current or Ex-Partner: No    Emotionally Abused: No    Physically Abused: No    Sexually Abused: No    Outpatient Medications Prior to Visit  Medication Sig Dispense Refill   acetaminophen (TYLENOL) 500 MG tablet Take 1,000 mg by mouth every 6 (six) hours as needed for moderate pain or headache.     amiodarone (PACERONE) 200 MG tablet Take 1 tablet (200 mg total) by mouth every evening. 90 tablet 3   apixaban (ELIQUIS) 5 MG TABS tablet Take 1 tablet (5 mg total) by mouth 2 (two) times daily. 60 tablet 0   BD PEN NEEDLE MICRO U/F 32G X 6 MM MISC SMARTSIG:1 Needle SUB-Q Daily     Budeson-Glycopyrrol-Formoterol (BREZTRI AEROSPHERE) 160-9-4.8 MCG/ACT AERO Inhale 2 puffs into the lungs 2 (two) times daily. 10.7 g 3   cholecalciferol (VITAMIN D3) 25 MCG (1000 UT) tablet Take 1,000 Units by mouth daily.      dapagliflozin propanediol (FARXIGA) 10 MG TABS tablet Take 1 tablet (10 mg total) by mouth daily before breakfast. 90 tablet 3   folic acid (FOLVITE) 800 MCG tablet Take 800 mcg by mouth daily.     HYDROcodone-acetaminophen (NORCO) 10-325 MG tablet Take 1 tablet by mouth every 4 (four) hours as needed for moderate pain.     insulin degludec (TRESIBA FLEXTOUCH) 100 UNIT/ML FlexTouch Pen Inject 56 Units into the skin daily.     ipratropium-albuterol (DUONEB) 0.5-2.5 (3) MG/3ML SOLN USE 1 VIAL IN NEBULIZER EVERY 6 HOURS AS NEEDED FOR SHORTNESS OF BREATH 90 mL 0   levocetirizine (XYZAL) 5 MG tablet Take 5 mg by mouth daily as needed for allergies.     metolazone (ZAROXOLYN) 5 MG tablet Take 5 mg by mouth once a week. Uses prn if swollen once weekly.     metoprolol tartrate (LOPRESSOR) 50 MG tablet TAKE 1 TABLET BY MOUTH IN THE MORNING AND 1 AT NOON AND 1 AT BEDTIME 270 tablet 3   montelukast (SINGULAIR) 10 MG tablet TAKE ONE TABLET BY MOUTH EVERYDAY AT BEDTIME 90 tablet 1   Omeprazole 20 MG TBEC Take 20 mg by mouth 2 (two)  times daily before a meal.      potassium chloride SA (KLOR-CON M) 20 MEQ tablet TAKE TWO TABLETS BY MOUTH AT BREAKFAST AND AT BEDTIME 120 tablet 2   rOPINIRole (REQUIP) 0.5 MG tablet TAKE 1 TABLET BY MOUTH AT BEDTIME 30 tablet 2   rosuvastatin (CRESTOR) 40 MG tablet TAKE ONE TABLET BY MOUTH EVERYDAY AT BEDTIME 90 tablet 2   Semaglutide, 1 MG/DOSE, (OZEMPIC, 1 MG/DOSE,) 2 MG/1.5ML SOPN Inject 1 mg into the skin once a week. 9 mL 3   sertraline (ZOLOFT) 50 MG  tablet TAKE ONE TABLET BY MOUTH AT BREAKFAST AND AT BEDTIME 180 tablet 2   tamsulosin (FLOMAX) 0.4 MG CAPS capsule TAKE ONE CAPSULE BY MOUTH EVERYDAY AT BEDTIME 90 capsule 1   tiZANidine (ZANAFLEX) 2 MG tablet TAKE ONE TABLET BY MOUTH EVERYDAY AT BEDTIME 30 tablet 2   torsemide (DEMADEX) 20 MG tablet TAKE TWO TABLETS BY MOUTH EVERY MORNING and TAKE TWO TABLETS BY MOUTH AT NOON 180 tablet 2   VENTOLIN HFA 108 (90 Base) MCG/ACT inhaler INHALE 2 PUFFS BY MOUTH EVERY 4 HOURS AS NEEDED FOR WHEEZING OR SHORTNESS OF BREATH 18 g 0   No facility-administered medications prior to visit.    Allergies  Allergen Reactions   Androgel [Testosterone] Other (See Comments)    Pedal edema    Biaxin [Clarithromycin] Other (See Comments)    Taste perception alteration   Duloxetine Other (See Comments)    Hallucinations   Gabapentin Other (See Comments)    hallucinations   Ibuprofen Other (See Comments)    GI upset    Lyrica [Pregabalin] Swelling   Spironolactone Other (See Comments)    Review of Systems  Constitutional:  Negative for chills, fatigue, fever and unexpected weight change.  HENT:  Positive for congestion, ear pain and sinus pain. Negative for sore throat.   Respiratory:  Positive for shortness of breath (COPD and asthma). Negative for cough.   Cardiovascular:  Negative for chest pain and palpitations.  Gastrointestinal:  Negative for abdominal pain, blood in stool, constipation, diarrhea, nausea and vomiting.  Endocrine: Negative  for polydipsia.  Genitourinary:  Negative for dysuria.  Musculoskeletal:  Negative for back pain.  Skin:  Negative for rash.  Neurological:  Positive for dizziness and headaches.       Objective:        05/06/2023    3:08 PM 02/15/2023   11:04 AM 12/04/2022    1:10 PM  Vitals with BMI  Height 5\' 11"  5\' 11"  5\' 11"   Weight 315 lbs 320 lbs 317 lbs  BMI 43.95 44.65 44.23  Systolic 130 124 161  Diastolic 60 76 72  Pulse 79 64 72    No data found.   Physical Exam Vitals reviewed.  Constitutional:      Appearance: Normal appearance.  HENT:     Right Ear: Tympanic membrane and ear canal normal.     Left Ear: Tympanic membrane and ear canal normal.     Nose: Congestion present.     Mouth/Throat:     Mouth: Mucous membranes are moist.     Pharynx: Oropharynx is clear.  Cardiovascular:     Rate and Rhythm: Normal rate and regular rhythm.     Heart sounds: Normal heart sounds.  Pulmonary:     Effort: Pulmonary effort is normal. Prolonged expiration present.     Breath sounds: Wheezing and rhonchi present.  Abdominal:     General: Bowel sounds are normal.     Palpations: Abdomen is soft.     Tenderness: There is no abdominal tenderness.  Neurological:     Mental Status: He is alert and oriented to person, place, and time.  Psychiatric:        Mood and Affect: Mood normal.        Behavior: Behavior normal.     Health Maintenance Due  Topic Date Due   OPHTHALMOLOGY EXAM  Never done   DTaP/Tdap/Td (1 - Tdap) Never done   COVID-19 Vaccine (5 - 2023-24 season) 01/04/2023   Zoster Vaccines- Shingrix (  2 of 2) 04/18/2023    There are no preventive care reminders to display for this patient.   Lab Results  Component Value Date   TSH 3.790 11/09/2022   Lab Results  Component Value Date   WBC 8.6 03/22/2023   HGB 13.3 03/22/2023   HCT 43.0 03/22/2023   MCV 91 03/22/2023   PLT 164 03/22/2023   Lab Results  Component Value Date   NA 139 02/22/2023   K 4.5 02/22/2023    CO2 25 02/22/2023   GLUCOSE 171 (H) 02/22/2023   BUN 20 02/22/2023   CREATININE 1.63 (H) 02/22/2023   BILITOT 1.0 02/22/2023   ALKPHOS 123 (H) 02/22/2023   AST 39 02/22/2023   ALT 33 02/22/2023   PROT 6.5 02/22/2023   ALBUMIN 4.1 02/22/2023   CALCIUM 9.7 02/22/2023   EGFR 43 (L) 02/22/2023   Lab Results  Component Value Date   CHOL 163 02/22/2023   Lab Results  Component Value Date   HDL 43 02/22/2023   Lab Results  Component Value Date   LDLCALC 97 02/22/2023   Lab Results  Component Value Date   TRIG 126 02/22/2023   Lab Results  Component Value Date   CHOLHDL 3.8 02/22/2023   Lab Results  Component Value Date   HGBA1C 7.5 (H) 02/22/2023       Assessment & Plan:  Upper respiratory infection with cough and congestion Assessment & Plan: Ordered chest x-ray to rule out pneumonia. Patient will get that done on Monday. We will adjust treatment based on results from the x-ray.  Orders: -     Amoxicillin-Pot Clavulanate; Take 1 tablet by mouth 2 (two) times daily.  Dispense: 20 tablet; Refill: 0  Acute cystitis with hematuria Assessment & Plan: Prescribed Augmentin to treat UTI  Orders: -     POCT URINALYSIS DIP (CLINITEK) -     Urine Culture -     Amoxicillin-Pot Clavulanate; Take 1 tablet by mouth 2 (two) times daily.  Dispense: 20 tablet; Refill: 0  Encounter for screening for pneumonia Assessment & Plan: Chest x-ray ordered to rule out pneumonia vs COPD exacerbation.   Orders: -     DG Chest 2 View; Future     Meds ordered this encounter  Medications   amoxicillin-clavulanate (AUGMENTIN) 875-125 MG tablet    Sig: Take 1 tablet by mouth 2 (two) times daily.    Dispense:  20 tablet    Refill:  0    Orders Placed This Encounter  Procedures   Urine Culture   DG Chest 2 View   POCT URINALYSIS DIP (CLINITEK)     Follow-up: No follow-ups on file.  An After Visit Summary was printed and given to the patient.  Langley Gauss, [facility] [phone]

## 2023-05-12 LAB — URINE CULTURE

## 2023-05-18 ENCOUNTER — Other Ambulatory Visit: Payer: Self-pay | Admitting: Family Medicine

## 2023-05-20 ENCOUNTER — Other Ambulatory Visit: Payer: Self-pay | Admitting: Family Medicine

## 2023-05-31 ENCOUNTER — Ambulatory Visit: Payer: Medicare HMO

## 2023-06-02 ENCOUNTER — Ambulatory Visit: Payer: Medicare HMO | Attending: Cardiology

## 2023-06-02 ENCOUNTER — Encounter: Payer: Self-pay | Admitting: Cardiology

## 2023-06-02 ENCOUNTER — Ambulatory Visit: Payer: Medicare HMO | Attending: Cardiology | Admitting: Cardiology

## 2023-06-02 VITALS — BP 130/82 | HR 76 | Ht 72.0 in | Wt 311.4 lb

## 2023-06-02 DIAGNOSIS — Z7901 Long term (current) use of anticoagulants: Secondary | ICD-10-CM

## 2023-06-02 DIAGNOSIS — I5032 Chronic diastolic (congestive) heart failure: Secondary | ICD-10-CM

## 2023-06-02 DIAGNOSIS — I350 Nonrheumatic aortic (valve) stenosis: Secondary | ICD-10-CM

## 2023-06-02 DIAGNOSIS — Z79899 Other long term (current) drug therapy: Secondary | ICD-10-CM

## 2023-06-02 DIAGNOSIS — I480 Paroxysmal atrial fibrillation: Secondary | ICD-10-CM | POA: Diagnosis not present

## 2023-06-02 DIAGNOSIS — N183 Chronic kidney disease, stage 3 unspecified: Secondary | ICD-10-CM

## 2023-06-02 DIAGNOSIS — I452 Bifascicular block: Secondary | ICD-10-CM

## 2023-06-02 DIAGNOSIS — I130 Hypertensive heart and chronic kidney disease with heart failure and stage 1 through stage 4 chronic kidney disease, or unspecified chronic kidney disease: Secondary | ICD-10-CM

## 2023-06-02 NOTE — Patient Instructions (Addendum)
Medication Instructions:  Your physician recommends that you continue on your current medications as directed. Please refer to the Current Medication list given to you today.  *If you need a refill on your cardiac medications before your next appointment, please call your pharmacy*   Lab Work: None If you have labs (blood work) drawn today and your tests are completely normal, you will receive your results only by: MyChart Message (if you have MyChart) OR A paper copy in the mail If you have any lab test that is abnormal or we need to change your treatment, we will call you to review the results.   Testing/Procedures: A zio monitor was ordered today. It will remain on for 7 days. You will then return monitor and event diary in provided box. It takes 1-2 weeks for report to be downloaded and returned to [place]. We will call you with the results. If monitor falls off or has orange flashing light, please call [employer] for further instructions.     Follow-Up: At [facility], you and your health needs are our priority.  As part of our continuing mission to provide you with exceptional heart care, we have created designated Provider Care Teams.  These Care Teams include your primary Cardiologist (physician) and Advanced Practice Providers (APPs -  Physician Assistants and Nurse Practitioners) who all work together to provide you with the care you need, when you need it.  We recommend signing up for the patient portal called "MyChart".  Sign up information is provided on this After Visit Summary.  MyChart is used to connect with patients for Virtual Visits (Telemedicine).  Patients are able to view lab/test results, encounter notes, upcoming appointments, etc.  Non-urgent messages can be sent to your provider as well.   To learn more about what you can do with MyChart, go to [email or web address].    Your next appointment:   6 month(s)  Provider:   Norman Herrlich, MD    Other  Instructions Check home Mobile K[place]ia several times per week

## 2023-06-02 NOTE — Progress Notes (Signed)
Cardiology Office Note:    Date:  06/02/2023   ID:  Shawn Sullivan, DOB [birth year], MRN [id number]  PCP:  Blane Ohara, MD  Cardiologist:  Norman Herrlich, MD    Referring MD: Blane Ohara, MD please do a CMP TSH T3-T4 every 6 months with your office labs on amiodarone   ASSESSMENT:    1. RBBB (right bundle branch block with left anterior fascicular block)   2. PAF (paroxysmal atrial fibrillation) (HCC)   3. On amiodarone therapy   4. Chronic anticoagulation   5. Hypertensive heart and kidney disease with chronic diastolic congestive heart failure and stage 3 chronic kidney disease, unspecified whether stage 3a or 3b CKD (HCC)   6. Nonrheumatic aortic valve stenosis    PLAN:    In order of problems listed above:  The send last evening we did an EKG that shows bifascicular heart block the PR interval is difficult to measure it is about 200 ms I am concerned he may be developing progressive conduction system disease and he agrees to wear an event monitor for 1 week.  If he shows evidence of more prominent conduction system disease or has more significant symptoms we need to consider the merits of permanent pacing. Continue his amiodarone anticoagulant I will ask his PCP every 6 months check his liver and thyroids with amiodarone therapy Is heart failure seems to be well-controlled at this time manage with his daughter's assistance he will continue his current loop diuretics. He has mild aortic stenosis   Next appointment: 6 months   Medication Adjustments/Labs and Tests Ordered: Current medicines are reviewed at length with the patient today.  Concerns regarding medicines are outlined above.  Orders Placed This Encounter  Procedures   EKG 12-Lead   No orders of the defined types were placed in this encounter.    History of Present Illness:    Shawn Sullivan is a 77 y.o. male with a hx of paroxysmal atrial fibrillation maintaining sinus rhythm on low-dose amiodarone  therapy chronic anticoagulation right bundle branch block hypertensive heart and kidney disease with chronic diastolic heart failure and stage III CKD mild aortic stenosis and hyperlipidemia last seen 12/04/2022.  With amiodarone he had a CMP performed in March and thyroid study TSH 1 year ago  Compliance with diet, lifestyle and medications: Yes  Overall he is doing well yesterday he had a great deal of family stress was outdoors in the hot humid weather felt badly came in the house had some black spots in front of his eyes momentarily but did not feel as if he would faint and he had momentary chest discomfort. We just gust an event monitor he defers He is not having exertional chest pain He is doing his fluid by taking Zaroxolyn weekly as needed He has his usual shortness of breath coughs wheezes uses bronchodilators and is having a chest x-ray at the hospital Past Medical History:  Diagnosis Date   Anticoagulated 07/01/2018   Anticoagulated 07/01/2018   Benign essential hypertension 05/06/2013   Chronic obstructive pulmonary disease (HCC) 11/05/2015   COPD (chronic obstructive pulmonary disease) (HCC) 07/01/2018   Dermatochalasis 12/16/2015   Disorder of bursae and tendons in shoulder region 01/05/2014   Generalized edema 06/22/2016   Hyperlipidemia 12/16/2015   Hypertensive heart disease 07/01/2018   Low back pain 08/17/2013   Lumbosacral spondylosis 05/06/2013   Obstructive sleep apnea 07/01/2018   Persistent atrial fibrillation (HCC) 07/01/2018   Piriformis syndrome 05/03/2014   Postlaminectomy syndrome, lumbar region  05/06/2013   Presbyopia of both eyes 12/16/2015   Restless legs syndrome 03/23/2016   Restrictive lung disease 06/22/2016   SI (sacroiliac) pain 08/12/2020   Sleep apnea 05/06/2013   Status post intraocular lens implant 12/16/2015   Type 2 diabetes mellitus without complications (HCC) 11/05/2015    Current Medications: Current Meds  Medication Sig    acetaminophen (TYLENOL) 500 MG tablet Take 1,000 mg by mouth every 6 (six) hours as needed for moderate pain or headache.   amiodarone (PACERONE) 200 MG tablet Take 1 tablet (200 mg total) by mouth every evening.   apixaban (ELIQUIS) 5 MG TABS tablet Take 1 tablet by mouth twice daily   BD PEN NEEDLE MICRO U/F 32G X 6 MM MISC SMARTSIG:1 Needle SUB-Q Daily   Budeson-Glycopyrrol-Formoterol (BREZTRI AEROSPHERE) 160-9-4.8 MCG/ACT AERO Inhale 2 puffs into the lungs 2 (two) times daily.   cholecalciferol (VITAMIN D3) 25 MCG (1000 UT) tablet Take 1,000 Units by mouth daily.    dapagliflozin propanediol (FARXIGA) 10 MG TABS tablet Take 1 tablet (10 mg total) by mouth daily before breakfast.   folic acid (FOLVITE) 800 MCG tablet Take 800 mcg by mouth daily.   HYDROcodone-acetaminophen (NORCO) 10-325 MG tablet Take 1 tablet by mouth every 4 (four) hours as needed for moderate pain.   insulin degludec (TRESIBA FLEXTOUCH) 100 UNIT/ML FlexTouch Pen Inject 56 Units into the skin daily.   ipratropium-albuterol (DUONEB) 0.5-2.5 (3) MG/3ML SOLN USE 1 VIAL IN NEBULIZER EVERY 6 HOURS AS NEEDED FOR SHORTNESS OF BREATH   levocetirizine (XYZAL) 5 MG tablet Take 5 mg by mouth daily as needed for allergies.   metolazone (ZAROXOLYN) 5 MG tablet Take 5 mg by mouth once a week. Uses prn if swollen once weekly.   metoprolol tartrate (LOPRESSOR) 50 MG tablet TAKE 1 TABLET BY MOUTH IN THE MORNING AND 1 AT NOON AND 1 AT BEDTIME   montelukast (SINGULAIR) 10 MG tablet TAKE ONE TABLET BY MOUTH EVERYDAY AT BEDTIME   Omeprazole 20 MG TBEC Take 20 mg by mouth 2 (two) times daily before a meal.    potassium chloride SA (KLOR-CON M) 20 MEQ tablet TAKE TWO TABLETS BY MOUTH AT BREAKFAST AND AT BEDTIME   rOPINIRole (REQUIP) 0.5 MG tablet TAKE 1 TABLET BY MOUTH AT BEDTIME   rosuvastatin (CRESTOR) 40 MG tablet TAKE ONE TABLET BY MOUTH EVERYDAY AT BEDTIME   Semaglutide, 1 MG/DOSE, (OZEMPIC, 1 MG/DOSE,) 2 MG/1.5ML SOPN Inject 1 mg into the  skin once a week.   sertraline (ZOLOFT) 50 MG tablet TAKE ONE TABLET BY MOUTH AT BREAKFAST AND AT BEDTIME   tamsulosin (FLOMAX) 0.4 MG CAPS capsule TAKE ONE CAPSULE BY MOUTH EVERYDAY AT BEDTIME   tiZANidine (ZANAFLEX) 2 MG tablet TAKE ONE TABLET BY MOUTH EVERYDAY AT BEDTIME   torsemide (DEMADEX) 20 MG tablet TAKE TWO TABLETS BY MOUTH EVERY MORNING and TAKE TWO TABLETS BY MOUTH AT NOON   VENTOLIN HFA 108 (90 Base) MCG/ACT inhaler INHALE 2 PUFFS BY MOUTH EVERY 4 HOURS AS NEEDED FOR WHEEZING OR SHORTNESS OF BREATH      EKGs/Labs/Other Studies Reviewed:    The following studies were reviewed today:  Cardiac Studies & Procedures       ECHOCARDIOGRAM  ECHOCARDIOGRAM COMPLETE 12/04/2022  Narrative ECHOCARDIOGRAM REPORT    Patient Name:   BOY FABRY Date of Exam: 12/04/2022 Medical Rec #:  [id number]          Height:       71.0 in Accession #:  [id number]         Weight:       309.0 lb Date of Birth:  [birth year]          BSA:          2.537 m Patient Age:    67 years           BP:           110/60 mmHg Patient Gender: M                  HR:           73 bpm. Exam Location:  [place]  Procedure: 2D Echo, Cardiac Doppler, Color Doppler and Strain Analysis  Indications:    Nonrheumatic aortic valve stenosis [I35.0 (ICD-10-CM)]  History:        Patient has prior history of Echocardiogram examinations, most recent 09/11/2020. CHF, Aortic Valve Disease, Arrythmias:Atrial Fibrillation; Risk Factors:Dyslipidemia and Hypertension.  Sonographer:    Margreta Journey RDCS Referring Phys: [id number] [facility]   Sonographer Comments: Technically difficult study due to poor echo windows. IMPRESSIONS   1. Left ventricular ejection fraction, by estimation, is 55 to 60%. The left ventricle has normal function. Left ventricular endocardial border not optimally defined to evaluate regional wall motion. There is moderate concentric left ventricular hypertrophy. Left ventricular diastolic  parameters were normal. 2. Right ventricular systolic function is normal. The right ventricular size is normal. 3. Left atrial size was mild to moderately dilated. 4. The mitral valve is normal in structure. Mild mitral valve regurgitation. No evidence of mitral stenosis. 5. The aortic valve is tricuspid. Aortic valve regurgitation is not visualized. Mild aortic valve stenosis. 6. The inferior vena cava is normal in size with greater than 50% respiratory variability, suggesting right atrial pressure of 3 mmHg.  FINDINGS Left Ventricle: Left ventricular ejection fraction, by estimation, is 55 to 60%. The left ventricle has normal function. Left ventricular endocardial border not optimally defined to evaluate regional wall motion. Global longitudinal strain performed but not reported based on interpreter judgement due to suboptimal tracking. The left ventricular internal cavity size was normal in size. There is moderate concentric left ventricular hypertrophy. Left ventricular diastolic parameters were normal. Normal left ventricular filling pressure.  Right Ventricle: The right ventricular size is normal. No increase in right ventricular wall thickness. Right ventricular systolic function is normal.  Left Atrium: Left atrial size was mild to moderately dilated.  Right Atrium: Right atrial size was normal in size.  Pericardium: There is no evidence of pericardial effusion.  Mitral Valve: The mitral valve is normal in structure. Mild mitral valve regurgitation. No evidence of mitral valve stenosis.  Tricuspid Valve: The tricuspid valve is normal in structure. Tricuspid valve regurgitation is not demonstrated. No evidence of tricuspid stenosis.  Aortic Valve: The aortic valve is tricuspid. Aortic valve regurgitation is not visualized. Mild aortic stenosis is present. Aortic valve mean gradient measures 13.0 mmHg. Aortic valve peak gradient measures 25.0 mmHg. Aortic valve area, by VTI  measures 1.56 cm.  Pulmonic Valve: The pulmonic valve was not assessed. Pulmonic valve regurgitation is not visualized. No evidence of pulmonic stenosis.  Aorta: The aortic arch was not well visualized and the aortic root and ascending aorta are structurally normal, with no evidence of dilitation.  Venous: The pulmonary veins were not well visualized. The inferior vena cava was not well visualized. The inferior vena cava is normal in size with greater than 50% respiratory variability, suggesting right atrial pressure  of 3 mmHg.  IAS/Shunts: No atrial level shunt detected by color flow Doppler.   LEFT VENTRICLE PLAX 2D LVIDd:         5.50 cm   Diastology LVIDs:         3.60 cm   LV e' medial:    7.29 cm/s LV PW:         1.50 cm   LV E/e' medial:  16.0 LV IVS:        1.50 cm   LV e' lateral:   7.07 cm/s LVOT diam:     2.00 cm   LV E/e' lateral: 16.5 LV SV:         90 LV SV Index:   35 LVOT Area:     3.14 cm   RIGHT VENTRICLE RV Basal diam:  3.90 cm RV Mid diam:    3.20 cm RV S prime:     11.00 cm/s TAPSE (M-mode): 3.3 cm  LEFT ATRIUM              Index        RIGHT ATRIUM           Index LA diam:        4.80 cm  1.89 cm/m   RA Area:     18.80 cm LA Vol (A2C):   111.0 ml 43.76 ml/m  RA Volume:   49.40 ml  19.47 ml/m LA Vol (A4C):   59.0 ml  23.26 ml/m LA Biplane Vol: 79.0 ml  31.14 ml/m AORTIC VALVE AV Area (Vmax):    1.47 cm AV Area (Vmean):   1.49 cm AV Area (VTI):     1.56 cm AV Vmax:           250.00 cm/s AV Vmean:          170.667 cm/s AV VTI:            0.574 m AV Peak Grad:      25.0 mmHg AV Mean Grad:      13.0 mmHg LVOT Vmax:         117.00 cm/s LVOT Vmean:        81.000 cm/s LVOT VTI:          0.285 m LVOT/AV VTI ratio: 0.50  AORTA Ao Root diam: 4.10 cm  MITRAL VALVE MV Area (PHT): 3.77 cm     SHUNTS MV Decel Time: 201 msec     Systemic VTI:  0.29 m MV E velocity: 117.00 cm/s  Systemic Diam: 2.00 cm MV A velocity: 100.00 cm/s MV E/A ratio:   1.17  Norman Herrlich MD Electronically signed by Norman Herrlich MD Signature Date/Time: 12/04/2022/12:52:32 PM    Final             His EKG today shows sinus rhythm top normal PR interval 0.200 seconds bifascicular heart block.  Recent Labs: 11/09/2022: TSH 3.790 02/22/2023: ALT 33; BUN 20; Creatinine, Ser 1.63; Potassium 4.5; Sodium 139 03/22/2023: Hemoglobin 13.3; Platelets 164  Recent Lipid Panel    Component Value Date/Time   CHOL 163 02/22/2023 0920   TRIG 126 02/22/2023 0920   HDL 43 02/22/2023 0920   CHOLHDL 3.8 02/22/2023 0920   LDLCALC 97 02/22/2023 0920    Physical Exam:    VS:  BP 130/82 (BP Location: Right Arm, Patient Position: Sitting, Cuff Size: Normal)   Pulse 76   Ht 6' (1.829 m)   Wt (!) 311 lb 6.4 oz (141.3 kg)   SpO2 96%  BMI 42.23 kg/m     Wt Readings from Last 3 Encounters:  06/02/23 (!) 311 lb 6.4 oz (141.3 kg)  05/06/23 (!) 315 lb (142.9 kg)  02/15/23 (!) 320 lb (145.2 kg)     GEN:  Well nourished, well developed in no acute distress HEENT: Normal NECK: No JVD; No carotid bruits LYMPHATICS: No lymphadenopathy CARDIAC: RRR, no murmurs, rubs, gallops RESPIRATORY:  Clear to auscultation without rales, wheezing or rhonchi  ABDOMEN: Soft, non-tender, non-distended MUSCULOSKELETAL:  No edema; No deformity  SKIN: Warm and dry NEUROLOGIC:  Alert and oriented x 3 PSYCHIATRIC:  Normal affect    Signed, Norman Herrlich, MD  06/02/2023 1:34 PM    [facility]

## 2023-06-10 ENCOUNTER — Telehealth: Payer: Self-pay | Admitting: Family Medicine

## 2023-06-15 DIAGNOSIS — Z7901 Long term (current) use of anticoagulants: Secondary | ICD-10-CM | POA: Diagnosis not present

## 2023-06-15 DIAGNOSIS — I480 Paroxysmal atrial fibrillation: Secondary | ICD-10-CM | POA: Diagnosis not present

## 2023-06-18 ENCOUNTER — Telehealth: Payer: Self-pay

## 2023-06-18 NOTE — Telephone Encounter (Signed)
-----   Message from Norman Herrlich sent at 06/18/2023  4:06 PM EDT ----- Good result is concerned of slow heart rhythms there are none present.

## 2023-06-18 NOTE — Telephone Encounter (Signed)
Spoke with Brenda(wife on DPR), notified of results

## 2023-06-28 ENCOUNTER — Ambulatory Visit: Payer: Medicare HMO | Admitting: Family Medicine

## 2023-07-13 ENCOUNTER — Telehealth: Payer: Self-pay

## 2023-07-13 ENCOUNTER — Other Ambulatory Visit: Payer: Self-pay | Admitting: Family Medicine

## 2023-07-14 ENCOUNTER — Other Ambulatory Visit: Payer: Self-pay | Admitting: Cardiology

## 2023-07-14 ENCOUNTER — Other Ambulatory Visit: Payer: Self-pay | Admitting: Family Medicine

## 2023-07-23 ENCOUNTER — Other Ambulatory Visit: Payer: Self-pay | Admitting: Cardiology

## 2023-07-27 ENCOUNTER — Telehealth: Payer: Self-pay

## 2023-07-27 ENCOUNTER — Other Ambulatory Visit: Payer: Self-pay | Admitting: Family Medicine

## 2023-07-27 NOTE — Telephone Encounter (Signed)
Patient Assistance Shawn Sullivan and pen needles was received. Patient was informed. He mentioned to come tomorrow

## 2023-08-04 ENCOUNTER — Telehealth: Payer: Self-pay

## 2023-08-04 NOTE — Telephone Encounter (Signed)
Patient's wife made aware medication is in office for pick up.

## 2023-08-05 ENCOUNTER — Other Ambulatory Visit: Payer: Self-pay | Admitting: Family Medicine

## 2023-08-06 NOTE — Telephone Encounter (Signed)
Patient's daughter picked up patient assistance. Signed form put in Kim's box.

## 2023-08-09 DIAGNOSIS — G894 Chronic pain syndrome: Secondary | ICD-10-CM | POA: Diagnosis not present

## 2023-08-09 DIAGNOSIS — M961 Postlaminectomy syndrome, not elsewhere classified: Secondary | ICD-10-CM | POA: Diagnosis not present

## 2023-08-09 DIAGNOSIS — M533 Sacrococcygeal disorders, not elsewhere classified: Secondary | ICD-10-CM | POA: Diagnosis not present

## 2023-08-09 DIAGNOSIS — Z79899 Other long term (current) drug therapy: Secondary | ICD-10-CM | POA: Diagnosis not present

## 2023-08-12 ENCOUNTER — Ambulatory Visit (INDEPENDENT_AMBULATORY_CARE_PROVIDER_SITE_OTHER): Payer: Medicare HMO

## 2023-08-12 VITALS — Ht 72.0 in | Wt 311.0 lb

## 2023-08-12 DIAGNOSIS — Z0000 Encounter for general adult medical examination without abnormal findings: Secondary | ICD-10-CM | POA: Diagnosis not present

## 2023-08-12 NOTE — Patient Instructions (Addendum)
Shawn Sullivan , Thank you for taking time to come for your Medicare Wellness Visit. I appreciate your ongoing commitment to your health goals. Please review the following plan we discussed and let me know if I can assist you in the future.   Referrals/Orders/Follow-Ups/Clinician Recommendations:   This is a list of the screening recommended for you and due dates:  Health Maintenance  Topic Date Due   Eye exam for diabetics  Never done   DTaP/Tdap/Td vaccine (1 - Tdap) Never done   Zoster (Shingles) Vaccine (2 of 2) 04/18/2023   Flu Shot  07/01/2023   COVID-19 Vaccine (5 - 2023-24 season) 08/01/2023   Hemoglobin A1C  08/25/2023   Yearly kidney health urinalysis for diabetes  02/15/2024   Complete foot exam   02/15/2024   Yearly kidney function blood test for diabetes  02/22/2024   Medicare Annual Wellness Visit  08/11/2024   Pneumonia Vaccine  Completed   Hepatitis C Screening  Completed   HPV Vaccine  Aged Out   Colon Cancer Screening  Discontinued    Advanced directives: (Declined) Advance directive discussed with you today. Even though you declined this today, please call our office should you change your mind, and we can give you the proper paperwork for you to fill out.  Next Medicare Annual Wellness Visit scheduled for next year: Yes

## 2023-08-12 NOTE — Progress Notes (Signed)
Subjective:   Shawn Sullivan is a 77 y.o. male who presents for Medicare Annual/Subsequent preventive examination.  Visit Complete: Virtual  I connected with  Shawn Sullivan on 08/12/23 by a audio enabled telemedicine application and verified that I am speaking with the correct person using two identifiers.  Patient Location: Home  Provider Location: Home Office  I discussed the limitations of evaluation and management by telemedicine. The patient expressed understanding and agreed to proceed.     Review of Systems    Vital Signs: Unable to obtain new vitals due to this being a telehealth visit.  Cardiac Risk Factors include: advanced age (>32men, >45 women);diabetes mellitus;male gender;hypertension;obesity (BMI >30kg/m2)     Objective:    Today's Vitals   08/12/23 1401  Weight: (!) 311 lb (141.1 kg)  Height: 6' (1.829 m)   Body mass index is 42.18 kg/m.     08/12/2023    2:13 PM 10/18/2020   10:12 AM 12/01/2018   12:28 PM 04/13/2017   10:40 AM  Advanced Directives  Does Patient Have a Medical Advance Directive? No No No No  Would patient like information on creating a medical advance directive? No - Patient declined  No - Patient declined No - Patient declined    Current Medications (verified) Outpatient Encounter Medications as of 08/12/2023  Medication Sig   acetaminophen (TYLENOL) 500 MG tablet Take 1,000 mg by mouth every 6 (six) hours as needed for moderate pain or headache.   amiodarone (PACERONE) 200 MG tablet TAKE ONE TABLET BY MOUTH EVERY EVENING   apixaban (ELIQUIS) 5 MG TABS tablet Take 1 tablet by mouth twice daily   BD PEN NEEDLE MICRO U/F 32G X 6 MM MISC SMARTSIG:1 Needle SUB-Q Daily   Budeson-Glycopyrrol-Formoterol (BREZTRI AEROSPHERE) 160-9-4.8 MCG/ACT AERO Inhale 2 puffs into the lungs 2 (two) times daily.   cholecalciferol (VITAMIN D3) 25 MCG (1000 UT) tablet Take 1,000 Units by mouth daily.    dapagliflozin propanediol (FARXIGA) 10 MG TABS  tablet Take 1 tablet (10 mg total) by mouth daily before breakfast.   folic acid (FOLVITE) 800 MCG tablet Take 800 mcg by mouth daily.   HYDROcodone-acetaminophen (NORCO) 10-325 MG tablet Take 1 tablet by mouth every 4 (four) hours as needed for moderate pain.   insulin degludec (TRESIBA FLEXTOUCH) 100 UNIT/ML FlexTouch Pen Inject 56 Units into the skin daily.   ipratropium-albuterol (DUONEB) 0.5-2.5 (3) MG/3ML SOLN USE 1 VIAL IN NEBULIZER EVERY 6 HOURS AS NEEDED FOR SHORTNESS OF BREATH   levocetirizine (XYZAL) 5 MG tablet Take 5 mg by mouth daily as needed for allergies.   metolazone (ZAROXOLYN) 5 MG tablet Take 5 mg by mouth once a week. Uses prn if swollen once weekly.   metoprolol tartrate (LOPRESSOR) 50 MG tablet TAKE 1 TABLET BY MOUTH EVERY MORNING AND 1 TABLET EVERY EVENING AND 1 TABLET AT BEDTIME *REFILL REQUEST*   montelukast (SINGULAIR) 10 MG tablet TAKE ONE TABLET BY MOUTH EVERYDAY AT BEDTIME   Omeprazole 20 MG TBEC Take 20 mg by mouth 2 (two) times daily before a meal.    potassium chloride SA (KLOR-CON M) 20 MEQ tablet TAKE TWO (2) TABLETS BY MOUTH AT BREAKFAST AND AT BEDTIME *REFILL REQUEST*   rOPINIRole (REQUIP) 0.5 MG tablet TAKE 1 TABLET BY MOUTH AT BEDTIME   rosuvastatin (CRESTOR) 40 MG tablet TAKE ONE TABLET BY MOUTH EVERYDAY AT BEDTIME   Semaglutide, 1 MG/DOSE, (OZEMPIC, 1 MG/DOSE,) 2 MG/1.5ML SOPN Inject 1 mg into the skin once a week.  sertraline (ZOLOFT) 50 MG tablet TAKE ONE TABLET BY MOUTH AT BREAKFAST AND AT BEDTIME   tamsulosin (FLOMAX) 0.4 MG CAPS capsule TAKE ONE CAPSULE BY MOUTH EVERYDAY AT BEDTIME   tiZANidine (ZANAFLEX) 2 MG tablet TAKE 1 TABLET BY MOUTH EVERY DAY AT BEDTIME *REFILL REQUEST*   torsemide (DEMADEX) 20 MG tablet TAKE TWO TABLETS BY MOUTH EVERY MORNING and TAKE TWO TABLETS BY MOUTH AT NOON   VENTOLIN HFA 108 (90 Base) MCG/ACT inhaler INHALE 2 PUFFS BY MOUTH EVERY 4 HOURS AS NEEDED FOR WHEEZING FOR SHORTNESS OF BREATH   No facility-administered  encounter medications on file as of 08/12/2023.    Allergies (verified) Androgel [testosterone], Biaxin [clarithromycin], Duloxetine, Gabapentin, Ibuprofen, Lyrica [pregabalin], and Spironolactone   History: Past Medical History:  Diagnosis Date   Anticoagulated 07/01/2018   Anticoagulated 07/01/2018   Benign essential hypertension 05/06/2013   Chronic obstructive pulmonary disease (HCC) 11/05/2015   COPD (chronic obstructive pulmonary disease) (HCC) 07/01/2018   Dermatochalasis 12/16/2015   Disorder of bursae and tendons in shoulder region 01/05/2014   Generalized edema 06/22/2016   Hyperlipidemia 12/16/2015   Hypertensive heart disease 07/01/2018   Low back pain 08/17/2013   Lumbosacral spondylosis 05/06/2013   Obstructive sleep apnea 07/01/2018   Persistent atrial fibrillation (HCC) 07/01/2018   Piriformis syndrome 05/03/2014   Postlaminectomy syndrome, lumbar region 05/06/2013   Presbyopia of both eyes 12/16/2015   Restless legs syndrome 03/23/2016   Restrictive lung disease 06/22/2016   SI (sacroiliac) pain 08/12/2020   Sleep apnea 05/06/2013   Status post intraocular lens implant 12/16/2015   Type 2 diabetes mellitus without complications (HCC) 11/05/2015   Past Surgical History:  Procedure Laterality Date   APPENDECTOMY     BACK SURGERY     CARDIOVERSION N/A 12/01/2018   Procedure: CARDIOVERSION;  Surgeon: Thurmon Fair, MD;  Location: [facility];  Service: Cardiovascular;  Laterality: N/A;   CATARACT EXTRACTION Bilateral    LUMBAR FUSION     NECK SURGERY     Family History  Problem Relation Age of Onset   Atrial fibrillation Mother    Cancer Mother    Heart disease Father    Social History   Socioeconomic History   Marital status: Married    Spouse name: Not on file   Number of children: Not on file   Years of education: Not on file   Highest education level: Not on file  Occupational History   Not on file  Tobacco Use   Smoking status: Former     Current packs/day: 0.00    Types: Cigarettes    Quit date: 1993    Years since quitting: 31.7   Smokeless tobacco: Never  Vaping Use   Vaping status: Never Used  Substance and Sexual Activity   Alcohol use: Not Currently   Drug use: Not Currently    Types: Hydrocodone   Sexual activity: Not on file  Other Topics Concern   Not on file  Social History Narrative   Not on file   Social Determinants of Health   Financial Resource Strain: Low Risk  (08/12/2023)   Overall Financial Resource Strain (CARDIA)    Difficulty of Paying Living Expenses: Not hard at all  Food Insecurity: No Food Insecurity (08/12/2023)   Hunger Vital Sign    Worried About Running Out of Food in the Last Year: Never true    Ran Out of Food in the Last Year: Never true  Transportation Needs: No Transportation Needs (08/12/2023)   PRAPARE - Transportation  Lack of Transportation (Medical): No    Lack of Transportation (Non-Medical): No  Physical Activity: Inactive (08/12/2023)   Exercise Vital Sign    Days of Exercise per Week: 0 days    Minutes of Exercise per Session: 0 min  Stress: No Stress Concern Present (08/12/2023)   [employer] of Occupational Health - Occupational Stress Questionnaire    Feeling of Stress : Not at all  Social Connections: Socially Integrated (08/12/2023)   Social Connection and Isolation Panel [NHANES]    Frequency of Communication with Friends and Family: More than three times a week    Frequency of Social Gatherings with Friends and Family: More than three times a week    Attends Religious Services: More than 4 times per year    Active Member of [employer] or Organizations: Yes    Attends Engineer, structural: More than 4 times per year    Marital Status: Married    Tobacco Counseling Counseling given: Not Answered   Clinical Intake:  Pre-visit preparation completed: Yes  Pain : No/denies pain     BMI - recorded: 42.18 Nutritional Status: BMI > 30   Obese Nutritional Risks: None Diabetes: Yes CBG done?: No Did pt. bring in CBG monitor from home?: No  How often do you need to have someone help you when you read instructions, pamphlets, or other written materials from your doctor or pharmacy?: 3 - Sometimes (Family Assist)  Interpreter Needed?: No  Information entered by :: Theresa Mulligan LPN   Activities of Daily Living    08/12/2023    2:09 PM 02/15/2023   11:24 AM  In your present state of health, do you have any difficulty performing the following activities:  Hearing? 0 0  Vision? 0 0  Difficulty concentrating or making decisions? 0 0  Walking or climbing stairs? 1 0  Comment Uses cane and walker   Dressing or bathing? 0 0  Doing errands, shopping? 0 1  Preparing Food and eating ? N   Using the Toilet? N   In the past six months, have you accidently leaked urine? Y   Comment Dx: Kidney Disease. Followed by medical attention   Do you have problems with loss of bowel control? N   Managing your Medications? N   Comment Daughter assist   Managing your Finances? N   Comment Daughter assist   Housekeeping or managing your Housekeeping? N   Comment Daughter assist     Patient Care Team: Cox, Fritzi Mandes, MD as PCP - General (Family Medicine) Zettie Pho, [facility] (Inactive) (Pharmacist) Baldo Daub, MD as Consulting Physician (Cardiology) Kizzie Bane as Physician Assistant (Physician Assistant) Olegario Shearer, MD as Referring Physician (Dermatology) Maxie Barb, MD as Consulting Physician (Nephrology)  Indicate any recent Medical Services you may have received from other than [facility] providers in the past year (date may be approximate).     Assessment:   This is a routine wellness examination for Shawn Sullivan.  Hearing/Vision screen Hearing Screening - Comments:: Denies hearing difficulties   Vision Screening - Comments:: Wears rx glasses - Not up to date with routine eye exams Patient deferred    Goals Addressed               This Visit's Progress     Live as long as I can. (pt-stated)         Depression Screen    08/12/2023    2:08 PM 02/15/2023   11:23 AM 11/09/2022  11:16 AM 08/20/2022   12:01 PM 07/16/2022    9:47 AM 12/24/2021   11:39 AM 09/09/2021   10:17 AM  PHQ 2/9 Scores  PHQ - 2 Score 0 0 0 0 0 0 0  PHQ- 9 Score  0  9 9  9     Fall Risk    08/12/2023    2:12 PM 02/15/2023   11:22 AM 11/09/2022   11:15 AM 08/20/2022   12:01 PM 07/16/2022    9:50 AM  Fall Risk   Falls in the past year? 0 0 0 1 1  Number falls in past yr: 0 0 0 1 1  Injury with Fall? 0 0 0 0 0  Risk for fall due to : No Fall Risks No Fall Risks No Fall Risks Orthopedic patient;Impaired balance/gait History of fall(s);Orthopedic patient  Follow up Falls prevention discussed Falls evaluation completed Falls evaluation completed Falls evaluation completed;Falls prevention discussed Falls evaluation completed;Education provided    MEDICARE RISK AT HOME: Medicare Risk at Home Any stairs in or around the home?: Yes If so, are there any without handrails?: No Home free of loose throw rugs in walkways, pet beds, electrical cords, etc?: Yes Adequate lighting in your home to reduce risk of falls?: Yes Life alert?: No Use of a cane, walker or w/c?: Yes Grab bars in the bathroom?: Yes Shower chair or bench in shower?: Yes Elevated toilet seat or a handicapped toilet?: No  TIMED UP AND GO:  Was the test performed?  No    Cognitive Function:        08/12/2023    2:13 PM  6CIT Screen  What Year? 0 points  What month? 0 points  What time? 0 points  Count back from 20 0 points  Months in reverse 0 points  Repeat phrase 0 points  Total Score 0 points    Immunizations Immunization History  Administered Date(s) Administered   COVID-19, mRNA, vaccine(Comirnaty)12 years and older 11/09/2022   Fluad Quad(high Dose 65+) 10/11/2020, 09/09/2021, 11/09/2022   Influenza-Unspecified 08/16/2019    Moderna Covid-19 Vaccine Bivalent Booster 10yrs & up 09/09/2021   Moderna SARS-COV2 Booster Vaccination 12/10/2020   Moderna Sars-Covid-2 Vaccination 12/22/2019, 01/19/2020   PNEUMOCOCCAL CONJUGATE-20 02/21/2023   Pneumococcal Conjugate-13 07/26/2014   Pneumococcal Polysaccharide-23 08/19/2012   Zoster Recombinant(Shingrix) 02/21/2023    TDAP status: Due, Education has been provided regarding the importance of this vaccine. Advised may receive this vaccine at local pharmacy or Health Dept. Aware to provide a copy of the vaccination record if obtained from local pharmacy or Health Dept. Verbalized acceptance and understanding.  Flu Vaccine status: Due, Education has been provided regarding the importance of this vaccine. Advised may receive this vaccine at local pharmacy or Health Dept. Aware to provide a copy of the vaccination record if obtained from local pharmacy or Health Dept. Verbalized acceptance and understanding.  Pneumococcal vaccine status: Up to date  Covid-19 vaccine status: Declined, Education has been provided regarding the importance of this vaccine but patient still declined. Advised may receive this vaccine at local pharmacy or Health Dept.or vaccine clinic. Aware to provide a copy of the vaccination record if obtained from local pharmacy or Health Dept. Verbalized acceptance and understanding.  Qualifies for Shingles Vaccine? Yes   Zostavax completed Yes   Shingrix Completed?: Yes  Screening Tests Health Maintenance  Topic Date Due   OPHTHALMOLOGY EXAM  Never done   DTaP/Tdap/Td (1 - Tdap) Never done   Zoster Vaccines- Shingrix (2 of 2)  04/18/2023   INFLUENZA VACCINE  07/01/2023   COVID-19 Vaccine (5 - 2023-24 season) 08/01/2023   HEMOGLOBIN A1C  08/25/2023   Diabetic kidney evaluation - Urine ACR  02/15/2024   FOOT EXAM  02/15/2024   Diabetic kidney evaluation - eGFR measurement  02/22/2024   Medicare Annual Wellness (AWV)  08/11/2024   Pneumonia Vaccine 18+  Years old  Completed   Hepatitis C Screening  Completed   HPV VACCINES  Aged Out   Colonoscopy  Discontinued    Health Maintenance  Health Maintenance Due  Topic Date Due   OPHTHALMOLOGY EXAM  Never done   DTaP/Tdap/Td (1 - Tdap) Never done   Zoster Vaccines- Shingrix (2 of 2) 04/18/2023   INFLUENZA VACCINE  07/01/2023   COVID-19 Vaccine (5 - 2023-24 season) 08/01/2023       Additional Screening:  Hepatitis C Screening: does qualify; Completed 07/16/22  Vision Screening: Recommended annual ophthalmology exams for early detection of glaucoma and other disorders of the eye. Is the patient up to date with their annual eye exam?  Yes  Who is the provider or what is the name of the office in which the patient attends annual eye exams? Deferred If pt is not established with a provider, would they like to be referred to a provider to establish care? No .   Dental Screening: Recommended annual dental exams for proper oral hygiene  Diabetic Foot Exam: Diabetic Foot Exam: Completed 02/15/23  Community Resource Referral / Chronic Care Management:  CRR required this visit?  No   CCM required this visit?  No     Plan:     I have personally reviewed and noted the following in the patient's chart:   Medical and social history Use of alcohol, tobacco or illicit drugs  Current medications and supplements including opioid prescriptions. Patient is not currently taking opioid prescriptions. Functional ability and status Nutritional status Physical activity Advanced directives List of other physicians Hospitalizations, surgeries, and ER visits in previous 12 months Vitals Screenings to include cognitive, depression, and falls Referrals and appointments  In addition, I have reviewed and discussed with patient certain preventive protocols, quality metrics, and best practice recommendations. A written personalized care plan for preventive services as well as general preventive  health recommendations were provided to patient.     Tillie Rung, LPN   [id number]   After Visit Summary: (MyChart) Due to this being a telephonic visit, the after visit summary with patients personalized plan was offered to patient via MyChart   Nurse Notes: None

## 2023-08-19 ENCOUNTER — Other Ambulatory Visit: Payer: Self-pay | Admitting: Family Medicine

## 2023-08-23 ENCOUNTER — Encounter: Payer: Self-pay | Admitting: Family Medicine

## 2023-08-23 ENCOUNTER — Ambulatory Visit (INDEPENDENT_AMBULATORY_CARE_PROVIDER_SITE_OTHER): Payer: Medicare HMO | Admitting: Family Medicine

## 2023-08-23 VITALS — BP 112/64 | HR 102 | Temp 97.2°F | Ht 72.0 in | Wt 317.0 lb

## 2023-08-23 DIAGNOSIS — J0111 Acute recurrent frontal sinusitis: Secondary | ICD-10-CM

## 2023-08-23 DIAGNOSIS — J441 Chronic obstructive pulmonary disease with (acute) exacerbation: Secondary | ICD-10-CM

## 2023-08-23 DIAGNOSIS — R051 Acute cough: Secondary | ICD-10-CM | POA: Insufficient documentation

## 2023-08-23 HISTORY — DX: Acute cough: R05.1

## 2023-08-23 HISTORY — DX: Acute recurrent frontal sinusitis: J01.11

## 2023-08-23 LAB — POCT INFLUENZA A/B
Influenza A, POC: NEGATIVE
Influenza B, POC: NEGATIVE

## 2023-08-23 LAB — POC COVID19 BINAXNOW: SARS Coronavirus 2 Ag: NEGATIVE

## 2023-08-23 MED ORDER — AMOXICILLIN-POT CLAVULANATE 875-125 MG PO TABS: 1.0000 | ORAL_TABLET | Freq: Two times a day (BID) | ORAL | 0 refills | Status: DC

## 2023-08-23 MED ORDER — PREDNISONE 20 MG PO TABS
20.0000 mg | ORAL_TABLET | Freq: Every day | ORAL | 0 refills | Status: AC
Start: 2023-08-23 — End: 2023-08-28

## 2023-08-23 NOTE — Progress Notes (Signed)
Acute Office Visit  Subjective:    Patient ID: Shawn Sullivan, male    DOB: Jan 04, 1946, 77 y.o.   MRN: [id number]  Chief Complaint  Patient presents with   URI    HPI: Patient is in today for Upper respiratory symptoms He complains of congestion, no  fever, non productive cough, and post nasal drip.with chills, night sweats or weight loss. Onset of symptoms was  1 weeks ago and gradually worsening.He is drinking plenty of fluids.   Patient is former smoker. Patient has taken Zyzal and his nebulizer. Patient states he has been out of the [place] for about one month.  Past Medical History:  Diagnosis Date   Anticoagulated 07/01/2018   Anticoagulated 07/01/2018   Benign essential hypertension 05/06/2013   Chronic obstructive pulmonary disease (HCC) 11/05/2015   COPD (chronic obstructive pulmonary disease) (HCC) 07/01/2018   Dermatochalasis 12/16/2015   Disorder of bursae and tendons in shoulder region 01/05/2014   Generalized edema 06/22/2016   Hyperlipidemia 12/16/2015   Hypertensive heart disease 07/01/2018   Low back pain 08/17/2013   Lumbosacral spondylosis 05/06/2013   Obstructive sleep apnea 07/01/2018   Persistent atrial fibrillation (HCC) 07/01/2018   Piriformis syndrome 05/03/2014   Postlaminectomy syndrome, lumbar region 05/06/2013   Presbyopia of both eyes 12/16/2015   Restless legs syndrome 03/23/2016   Restrictive lung disease 06/22/2016   SI (sacroiliac) pain 08/12/2020   Sleep apnea 05/06/2013   Status post intraocular lens implant 12/16/2015   Type 2 diabetes mellitus without complications (HCC) 11/05/2015    Past Surgical History:  Procedure Laterality Date   APPENDECTOMY     BACK SURGERY     CARDIOVERSION N/A 12/01/2018   Procedure: CARDIOVERSION;  Surgeon: Thurmon Fair, MD;  Location: [facility];  Service: Cardiovascular;  Laterality: N/A;   CATARACT EXTRACTION Bilateral    LUMBAR FUSION     NECK SURGERY      Family History  Problem  Relation Age of Onset   Atrial fibrillation Mother    Cancer Mother    Heart disease Father     Social History   Socioeconomic History   Marital status: Married    Spouse name: Not on file   Number of children: Not on file   Years of education: Not on file   Highest education level: Not on file  Occupational History   Not on file  Tobacco Use   Smoking status: Former    Current packs/day: 0.00    Types: Cigarettes    Quit date: 1993    Years since quitting: 31.7   Smokeless tobacco: Never  Vaping Use   Vaping status: Never Used  Substance and Sexual Activity   Alcohol use: Not Currently   Drug use: Not Currently    Types: Hydrocodone   Sexual activity: Not on file  Other Topics Concern   Not on file  Social History Narrative   Not on file   Social Determinants of Health   Financial Resource Strain: Low Risk  (08/12/2023)   Overall Financial Resource Strain (CARDIA)    Difficulty of Paying Living Expenses: Not hard at all  Food Insecurity: No Food Insecurity (08/12/2023)   Hunger Vital Sign    Worried About Running Out of Food in the Last Year: Never true    Ran Out of Food in the Last Year: Never true  Transportation Needs: No Transportation Needs (08/12/2023)   PRAPARE - Transportation    Lack of Transportation (Medical): No    Lack of  Transportation (Non-Medical): No  Physical Activity: Inactive (08/12/2023)   Exercise Vital Sign    Days of Exercise per Week: 0 days    Minutes of Exercise per Session: 0 min  Stress: No Stress Concern Present (08/12/2023)   [employer] of Occupational Health - Occupational Stress Questionnaire    Feeling of Stress : Not at all  Social Connections: Socially Integrated (08/12/2023)   Social Connection and Isolation Panel [NHANES]    Frequency of Communication with Friends and Family: More than three times a week    Frequency of Social Gatherings with Friends and Family: More than three times a week    Attends Religious  Services: More than 4 times per year    Active Member of [employer] or Organizations: Yes    Attends Engineer, structural: More than 4 times per year    Marital Status: Married  Catering manager Violence: Not At Risk (08/12/2023)   Humiliation, Afraid, Rape, and Kick questionnaire    Fear of Current or Ex-Partner: No    Emotionally Abused: No    Physically Abused: No    Sexually Abused: No    Outpatient Medications Prior to Visit  Medication Sig Dispense Refill   acetaminophen (TYLENOL) 500 MG tablet Take 1,000 mg by mouth every 6 (six) hours as needed for moderate pain or headache.     amiodarone (PACERONE) 200 MG tablet TAKE ONE TABLET BY MOUTH EVERY EVENING 90 tablet 3   apixaban (ELIQUIS) 5 MG TABS tablet Take 1 tablet by mouth twice daily 180 tablet 1   BD PEN NEEDLE MICRO U/F 32G X 6 MM MISC SMARTSIG:1 Needle SUB-Q Daily     Budeson-Glycopyrrol-Formoterol (BREZTRI AEROSPHERE) 160-9-4.8 MCG/ACT AERO Inhale 2 puffs into the lungs 2 (two) times daily. 10.7 g 3   cholecalciferol (VITAMIN D3) 25 MCG (1000 UT) tablet Take 1,000 Units by mouth daily.      dapagliflozin propanediol (FARXIGA) 10 MG TABS tablet Take 1 tablet (10 mg total) by mouth daily before breakfast. 90 tablet 3   folic acid (FOLVITE) 800 MCG tablet Take 800 mcg by mouth daily.     HYDROcodone-acetaminophen (NORCO) 10-325 MG tablet Take 1 tablet by mouth every 4 (four) hours as needed for moderate pain.     insulin degludec (TRESIBA FLEXTOUCH) 100 UNIT/ML FlexTouch Pen Inject 56 Units into the skin daily.     ipratropium-albuterol (DUONEB) 0.5-2.5 (3) MG/3ML SOLN USE 1 VIAL IN NEBULIZER EVERY 6 HOURS AS NEEDED FOR SHORTNESS OF BREATH 90 mL 0   levocetirizine (XYZAL) 5 MG tablet Take 5 mg by mouth daily as needed for allergies.     metolazone (ZAROXOLYN) 5 MG tablet Take 5 mg by mouth once a week. Uses prn if swollen once weekly.     metoprolol tartrate (LOPRESSOR) 50 MG tablet TAKE 1 TABLET BY MOUTH EVERY MORNING AND 1  TABLET EVERY EVENING AND 1 TABLET AT BEDTIME *REFILL REQUEST* 90 tablet 3   montelukast (SINGULAIR) 10 MG tablet TAKE ONE TABLET BY MOUTH EVERYDAY AT BEDTIME 90 tablet 2   Omeprazole 20 MG TBEC Take 20 mg by mouth 2 (two) times daily before a meal.      potassium chloride SA (KLOR-CON M) 20 MEQ tablet TAKE TWO (2) TABLETS BY MOUTH AT BREAKFAST AND AT BEDTIME *REFILL REQUEST* 120 tablet 2   rOPINIRole (REQUIP) 0.5 MG tablet TAKE 1 TABLET BY MOUTH AT BEDTIME 30 tablet 2   rosuvastatin (CRESTOR) 40 MG tablet TAKE ONE TABLET BY MOUTH EVERYDAY AT  BEDTIME 90 tablet 2   Semaglutide, 1 MG/DOSE, (OZEMPIC, 1 MG/DOSE,) 2 MG/1.5ML SOPN Inject 1 mg into the skin once a week. 9 mL 3   sertraline (ZOLOFT) 50 MG tablet TAKE ONE TABLET BY MOUTH AT BREAKFAST AND AT BEDTIME 180 tablet 2   tamsulosin (FLOMAX) 0.4 MG CAPS capsule TAKE ONE CAPSULE BY MOUTH EVERYDAY AT BEDTIME 90 capsule 2   tiZANidine (ZANAFLEX) 2 MG tablet TAKE 1 TABLET BY MOUTH EVERY DAY AT BEDTIME *REFILL REQUEST* 30 tablet 2   torsemide (DEMADEX) 20 MG tablet TAKE TWO TABLETS BY MOUTH EVERY MORNING and TAKE TWO TABLETS BY MOUTH AT NOON 180 tablet 2   VENTOLIN HFA 108 (90 Base) MCG/ACT inhaler INHALE 2 PUFFS BY MOUTH EVERY 4 HOURS AS NEEDED FOR SHORTNESS OF BREATH AND WHEEZING 18 g 10   No facility-administered medications prior to visit.    Allergies  Allergen Reactions   Androgel [Testosterone] Other (See Comments)    Pedal edema    Biaxin [Clarithromycin] Other (See Comments)    Taste perception alteration   Duloxetine Other (See Comments)    Hallucinations   Gabapentin Other (See Comments)    hallucinations   Ibuprofen Other (See Comments)    GI upset    Lyrica [Pregabalin] Swelling   Spironolactone Other (See Comments)    Review of Systems  Constitutional:  Negative for chills, fatigue and fever.  HENT:  Positive for congestion, postnasal drip and sore throat. Negative for ear pain, rhinorrhea, sinus pressure and sinus pain.    Respiratory:  Positive for cough and shortness of breath.   Cardiovascular:  Negative for chest pain.  Gastrointestinal:  Negative for diarrhea, nausea and vomiting.  Neurological:  Negative for dizziness and headaches.       Objective:        08/23/2023    1:46 PM 08/12/2023    2:01 PM 06/02/2023   12:49 PM  Vitals with BMI  Height 6\' 0"  6\' 0"  6\' 0"   Weight 317 lbs 311 lbs 311 lbs 6 oz  BMI 42.98 42.17 42.22  Systolic 112 -- 130  Diastolic 64 -- 82  Pulse 102  76    No data found.   Physical Exam Constitutional:      General: He is not in acute distress.    Appearance: He is obese. He is not ill-appearing.  HENT:     Head: Normocephalic.     Right Ear: Tympanic membrane normal.     Left Ear: Tympanic membrane normal.     Nose: Congestion present.     Right Turbinates: Swollen.     Left Turbinates: Swollen.     Right Sinus: Maxillary sinus tenderness and frontal sinus tenderness present.     Left Sinus: Maxillary sinus tenderness and frontal sinus tenderness present.  Eyes:     Conjunctiva/sclera: Conjunctivae normal.  Neck:     Vascular: No carotid bruit.  Cardiovascular:     Rate and Rhythm: Regular rhythm. Tachycardia present.     Heart sounds: Normal heart sounds.  Pulmonary:     Effort: Pulmonary effort is normal.     Breath sounds: Decreased breath sounds present. No wheezing.  Abdominal:     General: Bowel sounds are normal.     Palpations: Abdomen is soft.     Tenderness: There is no abdominal tenderness.  Neurological:     Mental Status: He is alert. Mental status is at baseline.  Psychiatric:        Mood and Affect:  Mood normal.     Health Maintenance Due  Topic Date Due   OPHTHALMOLOGY EXAM  Never done   DTaP/Tdap/Td (1 - Tdap) Never done   Zoster Vaccines- Shingrix (2 of 2) 04/18/2023   INFLUENZA VACCINE  07/01/2023   COVID-19 Vaccine (5 - 2023-24 season) 08/01/2023    There are no preventive care reminders to display for this  patient.   Lab Results  Component Value Date   TSH 3.790 11/09/2022   Lab Results  Component Value Date   WBC 8.6 03/22/2023   HGB 13.3 03/22/2023   HCT 43.0 03/22/2023   MCV 91 03/22/2023   PLT 164 03/22/2023   Lab Results  Component Value Date   NA 139 02/22/2023   K 4.5 02/22/2023   CO2 25 02/22/2023   GLUCOSE 171 (H) 02/22/2023   BUN 20 02/22/2023   CREATININE 1.63 (H) 02/22/2023   BILITOT 1.0 02/22/2023   ALKPHOS 123 (H) 02/22/2023   AST 39 02/22/2023   ALT 33 02/22/2023   PROT 6.5 02/22/2023   ALBUMIN 4.1 02/22/2023   CALCIUM 9.7 02/22/2023   EGFR 43 (L) 02/22/2023   Lab Results  Component Value Date   CHOL 163 02/22/2023   Lab Results  Component Value Date   HDL 43 02/22/2023   Lab Results  Component Value Date   LDLCALC 97 02/22/2023   Lab Results  Component Value Date   TRIG 126 02/22/2023   Lab Results  Component Value Date   CHOLHDL 3.8 02/22/2023   Lab Results  Component Value Date   HGBA1C 7.5 (H) 02/22/2023       Assessment & Plan:  Acute cough Assessment & Plan: Covid and flu - negative  Orders: -     POCT Influenza A/B -     POC COVID-19 BinaxNow  COPD with acute exacerbation (HCC) Assessment & Plan: Acute  Gave Breztri sample  Patient on patient assistance and has been out for about a month. Continue Duoneb every 6 hours as needed for shortness of breath and albuterol every 4 hours as needed for wheezing  Orders: -     predniSONE; Take 1 tablet (20 mg total) by mouth daily with breakfast for 5 days.  Dispense: 5 tablet; Refill: 0  Acute recurrent frontal sinusitis Assessment & Plan: Acute Augmentin 875-125 mg tablet by mouth TWICE A DAY for 10 days Increase fluids Continue Zyzal and Singulair as ordered for allergy symptoms    Orders: -     Amoxicillin-Pot Clavulanate; Take 1 tablet by mouth 2 (two) times daily.  Dispense: 20 tablet; Refill: 0     Meds ordered this encounter  Medications   predniSONE  (DELTASONE) 20 MG tablet    Sig: Take 1 tablet (20 mg total) by mouth daily with breakfast for 5 days.    Dispense:  5 tablet    Refill:  0   amoxicillin-clavulanate (AUGMENTIN) 875-125 MG tablet    Sig: Take 1 tablet by mouth 2 (two) times daily.    Dispense:  20 tablet    Refill:  0    Orders Placed This Encounter  Procedures   Influenza A/B   POC COVID-19     Follow-up: No follow-ups on file.  An After Visit Summary was printed and given to the patient.  Total time spent on today's visit was greater than 30 minutes, including both face-to-face time and nonface-to-face time personally spent on review of chart (labs and imaging), discussing labs and goals, discussing further work-up,  treatment options, referrals to specialist if needed, reviewing outside records if pertinent, answering patient's questions, and coordinating care.   Renne Crigler, FNP [facility] [phone]

## 2023-08-23 NOTE — Assessment & Plan Note (Signed)
Covid and flu negative

## 2023-08-23 NOTE — Assessment & Plan Note (Signed)
Acute  Gave Breztri sample  Patient on patient assistance and has been out for about a month. Continue Duoneb every 6 hours as needed for shortness of breath and albuterol every 4 hours as needed for wheezing

## 2023-08-23 NOTE — Assessment & Plan Note (Signed)
Acute Augmentin 875-125 mg tablet by mouth TWICE A DAY for 10 days Increase fluids Continue Zyzal and Singulair as ordered for allergy symptoms

## 2023-08-24 ENCOUNTER — Other Ambulatory Visit: Payer: Medicare HMO | Admitting: Pharmacist

## 2023-08-24 ENCOUNTER — Other Ambulatory Visit: Payer: Self-pay | Admitting: Family Medicine

## 2023-08-24 ENCOUNTER — Other Ambulatory Visit (HOSPITAL_COMMUNITY): Payer: Self-pay

## 2023-08-24 NOTE — Progress Notes (Signed)
08/24/2023 Name: Shawn Sullivan MRN: [id number] DOB: Dec 13, 1945   Care Coordination Call   Outreached patient in response to staff message regarding breztri patient assistance. Patient confirms he has been out of the medication for about a month, uses patient assistance, and received a sample 08/23/23 at office visit.  Will facilitate PAP status update/processing with rx med assistance team and follow up with patient when more updates are available.  Lynnda Shields, PharmD, BCPS Clinical Pharmacist [facility]

## 2023-08-24 NOTE — Progress Notes (Signed)
Care Coordination Call  Contact patient back to provide update: Rx Med assistance team contacted [employer], they received the prescription after the auto refill date that was August 05/2023. RX Med assistance team stated he has run out and need expedited. Manufacturer representative put in request today pt should receive medication within 10 to 14 days. He can call back to get tracking number once it has processed,  it will take about 2 days.   Provided patient and spouse with manufacturer number, [phone]   Also provided patient and spouse with my number if needing future pharmacist support. No scheduled follow up at this time.  Lynnda Shields, PharmD, BCPS Clinical Pharmacist [facility]

## 2023-09-07 ENCOUNTER — Ambulatory Visit: Payer: Medicare HMO | Admitting: Family Medicine

## 2023-09-07 VITALS — BP 130/76 | HR 100 | Temp 97.2°F | Ht 73.8 in | Wt 312.0 lb

## 2023-09-07 DIAGNOSIS — N1832 Chronic kidney disease, stage 3b: Secondary | ICD-10-CM | POA: Diagnosis not present

## 2023-09-07 DIAGNOSIS — Z23 Encounter for immunization: Secondary | ICD-10-CM

## 2023-09-07 DIAGNOSIS — E66813 Obesity, class 3: Secondary | ICD-10-CM

## 2023-09-07 DIAGNOSIS — J301 Allergic rhinitis due to pollen: Secondary | ICD-10-CM | POA: Diagnosis not present

## 2023-09-07 DIAGNOSIS — J410 Simple chronic bronchitis: Secondary | ICD-10-CM | POA: Diagnosis not present

## 2023-09-07 DIAGNOSIS — E0842 Diabetes mellitus due to underlying condition with diabetic polyneuropathy: Secondary | ICD-10-CM | POA: Diagnosis not present

## 2023-09-07 DIAGNOSIS — E1142 Type 2 diabetes mellitus with diabetic polyneuropathy: Secondary | ICD-10-CM

## 2023-09-07 DIAGNOSIS — I5041 Acute combined systolic (congestive) and diastolic (congestive) heart failure: Secondary | ICD-10-CM

## 2023-09-07 DIAGNOSIS — Z6841 Body Mass Index (BMI) 40.0 and over, adult: Secondary | ICD-10-CM

## 2023-09-07 DIAGNOSIS — Z794 Long term (current) use of insulin: Secondary | ICD-10-CM | POA: Diagnosis not present

## 2023-09-07 DIAGNOSIS — E782 Mixed hyperlipidemia: Secondary | ICD-10-CM | POA: Diagnosis not present

## 2023-09-07 DIAGNOSIS — I130 Hypertensive heart and chronic kidney disease with heart failure and stage 1 through stage 4 chronic kidney disease, or unspecified chronic kidney disease: Secondary | ICD-10-CM

## 2023-09-07 DIAGNOSIS — G2581 Restless legs syndrome: Secondary | ICD-10-CM

## 2023-09-07 DIAGNOSIS — F1120 Opioid dependence, uncomplicated: Secondary | ICD-10-CM

## 2023-09-07 MED ORDER — ROPINIROLE HCL 1 MG PO TABS: 1.0000 mg | ORAL_TABLET | Freq: Every day | ORAL | 2 refills | Status: DC

## 2023-09-07 MED ORDER — FLUTICASONE PROPIONATE 50 MCG/ACT NA SUSP
2.0000 | Freq: Every day | NASAL | 6 refills | Status: DC
Start: 2023-09-07 — End: 2024-02-09

## 2023-09-07 NOTE — Assessment & Plan Note (Signed)
Well controlled.  No changes to medicines. Continue crestor 40 mg before bed and coenzyme q10 daily.  Continue to work on eating a healthy diet and exercise.  Labs drawn today.

## 2023-09-07 NOTE — Assessment & Plan Note (Signed)
Well controlled.  No changes to medicines. Continue on metoprolol tartrate 50 mg three times a day, torsemide 20 mg 2 po twice daily, metolazone 5 mg once a wk as needed swelling. Continue to work on eating a healthy diet and exercise.  Labs drawn today.

## 2023-09-07 NOTE — Assessment & Plan Note (Addendum)
Continue Breztri 2 puffs twice daily. Using duoneb treatments/albuterol hfa once a day. Also on singulair and xyzal prn

## 2023-09-07 NOTE — Assessment & Plan Note (Signed)
Increase  requip 1 mg qhs

## 2023-09-07 NOTE — Assessment & Plan Note (Addendum)
Control: not at goal Recommend check sugars fasting daily. Recommend check feet daily. Recommend annual eye exams. Medicines: Continue : farxiga 10 mg daily, tresiba 60 U daily.Taking ozempic 1 mg weekly.    Continue to work on eating a healthy diet and exercise.  Labs drawn today.

## 2023-09-07 NOTE — Progress Notes (Signed)
Subjective:  Patient ID: Shawn Sullivan, male    DOB: 06/24/1946  Age: 77 y.o. MRN: [id number]  Chief Complaint  Patient presents with   Medical Management of Chronic Issues    HPI Diabetes:  Complications: neuropathy.  Glucose checking: daily Glucose logs: 116-265 fasting. Hypoglycemia: no Most recent A1C: 7.5 Current medications: farxiga 10 mg daily, tresiba 60 U daily. Taking ozempic 1 mg weekly.  Last Eye Exam: due for an appt.  Foot checks: continues to check daily.    Hyperlipidemia: Current medications: crestor 40 mg daily and coenzyme q10.    Hypertension: Complications: CKD. Kidney function stable.  Current medications: on metoprolol tartrate 50 mg three times a day, torsemide 20 mg 2 po twice daily, metolazone 5 mg once a wk as needed swelling. and potassium 20 meq 2 tablet twicedaily.  Medications: checked and unchanged. Swelling has improved.    Atrial fibrillation: on amiodarone 200 mg daily, metoprolol 50 mg three times a day and eliquis 5 mg twice daily. Dr. Dulce Sellar gave good report.   RLS: on requip 0.5 mg once daily as needed.   Chronic pain syndrome: Sees pain clinic.   Mild recurrent depression: on zoloft 50 mg twice daily   GERD: on omeprazole 20 mg 1 pill  oral twice daily   COPD: Breztri 2 puffs twice daily. Using duoneb treatments/albuterol hfa once a day also on singulair and xyzal prn   Diet: not healthy. Eating late at night. exercise: walking some.      08/12/2023    2:08 PM 02/15/2023   11:23 AM 11/09/2022   11:16 AM 08/20/2022   12:01 PM 07/16/2022    9:47 AM  Depression screen PHQ 2/9  Decreased Interest 0 0 0 0 0  Down, Depressed, Hopeless 0 0 0 0 0  PHQ - 2 Score 0 0 0 0 0  Altered sleeping  0  3 3  Tired, decreased energy  0  3 3  Change in appetite  0  3 3  Feeling bad or failure about yourself   0  0 0  Trouble concentrating  0  0 0  Moving slowly or fidgety/restless  0  0 0  Suicidal thoughts  0  0 0  PHQ-9 Score  0  9 9   Difficult doing work/chores  Not difficult at all  Somewhat difficult Somewhat difficult        08/12/2023    2:12 PM  Fall Risk   Falls in the past year? 0  Number falls in past yr: 0  Injury with Fall? 0  Risk for fall due to : No Fall Risks  Follow up Falls prevention discussed    Patient Care Team: Blane Ohara, MD as PCP - General (Family Medicine) Zettie Pho, [facility] (Inactive) (Pharmacist) Baldo Daub, MD as Consulting Physician (Cardiology) Marzetta Board, PA-C as Physician Assistant (Physician Assistant) Olegario Shearer, MD as Referring Physician (Dermatology) Maxie Barb, MD as Consulting Physician (Nephrology)   Review of Systems  Constitutional:  Negative for chills, diaphoresis, fatigue and fever.  HENT:  Positive for sinus pressure and sinus pain. Negative for congestion, ear pain and sore throat.   Respiratory:  Negative for cough and shortness of breath.   Cardiovascular:  Negative for chest pain and leg swelling.  Gastrointestinal:  Negative for abdominal pain, constipation, diarrhea, nausea and vomiting.  Genitourinary:  Negative for dysuria and urgency.  Musculoskeletal:  Negative for arthralgias and myalgias.  Neurological:  Negative for  dizziness and headaches.  Psychiatric/Behavioral:  Negative for dysphoric mood.     Current Outpatient Medications on File Prior to Visit  Medication Sig Dispense Refill   acetaminophen (TYLENOL) 500 MG tablet Take 1,000 mg by mouth every 6 (six) hours as needed for moderate pain or headache.     amiodarone (PACERONE) 200 MG tablet TAKE ONE TABLET BY MOUTH EVERY EVENING 90 tablet 3   apixaban (ELIQUIS) 5 MG TABS tablet Take 1 tablet by mouth twice daily 180 tablet 1   BD PEN NEEDLE MICRO U/F 32G X 6 MM MISC SMARTSIG:1 Needle SUB-Q Daily     Budeson-Glycopyrrol-Formoterol (BREZTRI AEROSPHERE) 160-9-4.8 MCG/ACT AERO Inhale 2 puffs into the lungs 2 (two) times daily. 10.7 g 3   cholecalciferol (VITAMIN D3)  25 MCG (1000 UT) tablet Take 1,000 Units by mouth daily.      dapagliflozin propanediol (FARXIGA) 10 MG TABS tablet Take 1 tablet (10 mg total) by mouth daily before breakfast. 90 tablet 3   folic acid (FOLVITE) 800 MCG tablet Take 800 mcg by mouth daily.     HYDROcodone-acetaminophen (NORCO) 10-325 MG tablet Take 1 tablet by mouth every 4 (four) hours as needed for moderate pain.     insulin degludec (TRESIBA FLEXTOUCH) 100 UNIT/ML FlexTouch Pen Inject 60 Units into the skin daily.     ipratropium-albuterol (DUONEB) 0.5-2.5 (3) MG/3ML SOLN INHALE THE CONTENTS OF 1 VIAL VIA NEBULIZER EVERY 6 HOURS AS NEEDED FOR SHORTNESS OF BREATH 360 mL 1   levocetirizine (XYZAL) 5 MG tablet Take 5 mg by mouth daily as needed for allergies.     metolazone (ZAROXOLYN) 5 MG tablet Take 5 mg by mouth once a week. Uses prn if swollen once weekly.     metoprolol tartrate (LOPRESSOR) 50 MG tablet TAKE 1 TABLET BY MOUTH EVERY MORNING AND 1 TABLET EVERY EVENING AND 1 TABLET AT BEDTIME *REFILL REQUEST* 90 tablet 3   montelukast (SINGULAIR) 10 MG tablet TAKE ONE TABLET BY MOUTH EVERYDAY AT BEDTIME 90 tablet 2   Omeprazole 20 MG TBEC Take 20 mg by mouth 2 (two) times daily before a meal.      potassium chloride SA (KLOR-CON M) 20 MEQ tablet TAKE TWO (2) TABLETS BY MOUTH AT BREAKFAST AND AT BEDTIME *REFILL REQUEST* 120 tablet 2   rosuvastatin (CRESTOR) 40 MG tablet TAKE ONE TABLET BY MOUTH EVERYDAY AT BEDTIME 90 tablet 2   Semaglutide, 1 MG/DOSE, (OZEMPIC, 1 MG/DOSE,) 2 MG/1.5ML SOPN Inject 1 mg into the skin once a week. 9 mL 3   sertraline (ZOLOFT) 50 MG tablet TAKE ONE TABLET BY MOUTH AT BREAKFAST AND AT BEDTIME 180 tablet 2   tamsulosin (FLOMAX) 0.4 MG CAPS capsule TAKE ONE CAPSULE BY MOUTH EVERYDAY AT BEDTIME 90 capsule 2   tiZANidine (ZANAFLEX) 2 MG tablet TAKE 1 TABLET BY MOUTH EVERY DAY AT BEDTIME *REFILL REQUEST* 30 tablet 2   torsemide (DEMADEX) 20 MG tablet TAKE TWO (2) TABLETS BY MOUTH EVERY MORNING AND 2 TABLETS  AT NOON 120 tablet 1   VENTOLIN HFA 108 (90 Base) MCG/ACT inhaler INHALE 2 PUFFS BY MOUTH EVERY 4 HOURS AS NEEDED FOR SHORTNESS OF BREATH AND WHEEZING 18 g 10   No current facility-administered medications on file prior to visit.   Past Medical History:  Diagnosis Date   Anticoagulated 07/01/2018   Anticoagulated 07/01/2018   Benign essential hypertension 05/06/2013   Chronic obstructive pulmonary disease (HCC) 11/05/2015   COPD (chronic obstructive pulmonary disease) (HCC) 07/01/2018   Dermatochalasis 12/16/2015  Disorder of bursae and tendons in shoulder region 01/05/2014   Generalized edema 06/22/2016   Hyperlipidemia 12/16/2015   Hypertensive heart disease 07/01/2018   Low back pain 08/17/2013   Lumbosacral spondylosis 05/06/2013   Obstructive sleep apnea 07/01/2018   Persistent atrial fibrillation (HCC) 07/01/2018   Piriformis syndrome 05/03/2014   Postlaminectomy syndrome, lumbar region 05/06/2013   Presbyopia of both eyes 12/16/2015   Restless legs syndrome 03/23/2016   Restrictive lung disease 06/22/2016   SI (sacroiliac) pain 08/12/2020   Sleep apnea 05/06/2013   Status post intraocular lens implant 12/16/2015   Type 2 diabetes mellitus without complications (HCC) 11/05/2015   Past Surgical History:  Procedure Laterality Date   APPENDECTOMY     BACK SURGERY     CARDIOVERSION N/A 12/01/2018   Procedure: CARDIOVERSION;  Surgeon: Thurmon Fair, MD;  Location: [facility];  Service: Cardiovascular;  Laterality: N/A;   CATARACT EXTRACTION Bilateral    LUMBAR FUSION     NECK SURGERY      Family History  Problem Relation Age of Onset   Atrial fibrillation Mother    Cancer Mother    Heart disease Father    Social History   Socioeconomic History   Marital status: Married    Spouse name: Not on file   Number of children: Not on file   Years of education: Not on file   Highest education level: Not on file  Occupational History   Not on file  Tobacco Use    Smoking status: Former    Current packs/day: 0.00    Types: Cigarettes    Quit date: 1993    Years since quitting: 31.8   Smokeless tobacco: Never  Vaping Use   Vaping status: Never Used  Substance and Sexual Activity   Alcohol use: Not Currently   Drug use: Not Currently    Types: Hydrocodone   Sexual activity: Not on file  Other Topics Concern   Not on file  Social History Narrative   Not on file   Social Determinants of Health   Financial Resource Strain: Low Risk  (08/12/2023)   Overall Financial Resource Strain (CARDIA)    Difficulty of Paying Living Expenses: Not hard at all  Food Insecurity: No Food Insecurity (08/12/2023)   Hunger Vital Sign    Worried About Running Out of Food in the Last Year: Never true    Ran Out of Food in the Last Year: Never true  Transportation Needs: No Transportation Needs (08/12/2023)   PRAPARE - Administrator, Civil Service (Medical): No    Lack of Transportation (Non-Medical): No  Physical Activity: Inactive (08/12/2023)   Exercise Vital Sign    Days of Exercise per Week: 0 days    Minutes of Exercise per Session: 0 min  Stress: No Stress Concern Present (08/12/2023)   [employer] of Occupational Health - Occupational Stress Questionnaire    Feeling of Stress : Not at all  Social Connections: Socially Integrated (08/12/2023)   Social Connection and Isolation Panel [NHANES]    Frequency of Communication with Friends and Family: More than three times a week    Frequency of Social Gatherings with Friends and Family: More than three times a week    Attends Religious Services: More than 4 times per year    Active Member of [employer] or Organizations: Yes    Attends Engineer, structural: More than 4 times per year    Marital Status: Married    Objective:  BP  130/76   Pulse 100   Temp (!) 97.2 F (36.2 C)   Ht 6' 1.8" (1.875 m)   Wt (!) 312 lb (141.5 kg)   SpO2 97%   BMI 40.28 kg/m      09/07/2023   11:14 AM  08/23/2023    1:46 PM 08/12/2023    2:01 PM  BP/Weight  Systolic BP 130 112 --  Diastolic BP 76 64 --  Wt. (Lbs) 312 317 311  BMI 40.28 kg/m2 42.99 kg/m2 42.18 kg/m2    Physical Exam Vitals reviewed.  Constitutional:      Appearance: Normal appearance. He is normal weight.  HENT:     Nose:     Right Turbinates: Swollen and pale.     Left Turbinates: Swollen and pale.  Cardiovascular:     Rate and Rhythm: Normal rate and regular rhythm.     Heart sounds: No murmur heard. Pulmonary:     Effort: Pulmonary effort is normal.     Breath sounds: Normal breath sounds.  Abdominal:     General: Abdomen is flat. Bowel sounds are normal.     Palpations: Abdomen is soft.     Tenderness: There is no abdominal tenderness.  Neurological:     Mental Status: He is alert and oriented to person, place, and time.  Psychiatric:        Mood and Affect: Mood normal.        Behavior: Behavior normal.     Diabetic Foot Exam - Simple   Simple Foot Form Visual Inspection No deformities, no ulcerations, no other skin breakdown bilaterally: Yes Sensation Testing See comments: Yes Pulse Check Posterior Tibialis and Dorsalis pulse intact bilaterally: Yes Comments Decreased sensation BL feet      Lab Results  Component Value Date   WBC 14.3 (H) 09/07/2023   HGB 11.0 (L) 09/07/2023   HCT 38.0 09/07/2023   PLT 196 09/07/2023   GLUCOSE 146 (H) 09/07/2023   CHOL 140 09/07/2023   TRIG 174 (H) 09/07/2023   HDL 35 (L) 09/07/2023   LDLCALC 75 09/07/2023   ALT 20 09/07/2023   AST 28 09/07/2023   NA 140 09/07/2023   K 3.5 09/07/2023   CL 91 (L) 09/07/2023   CREATININE 2.13 (H) 09/07/2023   BUN 34 (H) 09/07/2023   CO2 30 (H) 09/07/2023   TSH 3.790 11/09/2022   INR 1.1 11/25/2018   HGBA1C 7.7 (H) 09/07/2023   MICROALBUR 80 01/14/2021      Assessment & Plan:    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  No changes to medicines. Continue on metoprolol tartrate 50 mg three times a day, torsemide 20 mg 2 po twice daily, metolazone 5 mg once a wk as needed swelling. Continue to work on eating a healthy diet and exercise.  Labs drawn today.    Orders: -     CBC with Differential/Platelet -     Comprehensive metabolic panel  Simple chronic bronchitis (HCC) Assessment & Plan: Continue Breztri 2 puffs twice daily. Using duoneb treatments/albuterol hfa once a day. Also on singulair and xyzal prn   Mixed hyperlipidemia Assessment & Plan: Well controlled.  No changes to medicines. Continue crestor 40 mg before bed and coenzyme q10 daily.  Continue to work on eating a healthy diet and exercise.  Labs drawn today.    Orders: -  Lipid panel  Encounter for immunization -     Flu Vaccine Trivalent High Dose (Fluad)  Seasonal allergic rhinitis due to pollen Assessment & Plan: Recommend start flonase.   Orders: -     Fluticasone Propionate; Place 2 sprays into both nostrils daily.  Dispense: 16 g; Refill: 6  Class 3 severe obesity due to excess calories with serious comorbidity and body mass index (BMI) of 40.0 to 44.9 in adult [facility]) Assessment & Plan: Recommend continue to work on eating healthy diet and exercise.    Uncomplicated opioid dependence (HCC) Assessment & Plan: Management per specialist. Treating for chronic pain.   Diabetic polyneuropathy associated with type 2 diabetes mellitus (HCC) Assessment & Plan: Control: not at goal Recommend check sugars fasting daily. Recommend check feet daily. Recommend annual eye exams. Medicines: Continue : farxiga 10 mg daily, tresiba 60 U daily.Taking ozempic 1 mg weekly.    Continue to work on eating a healthy diet and exercise.  Labs drawn today.     Orders: -     Hemoglobin A1c  Other orders -     rOPINIRole HCl; Take 1 tablet (1 mg total) by mouth at bedtime.   Dispense: 30 tablet; Refill: 2     Meds ordered this encounter  Medications   rOPINIRole (REQUIP) 1 MG tablet    Sig: Take 1 tablet (1 mg total) by mouth at bedtime.    Dispense:  30 tablet    Refill:  2   fluticasone (FLONASE) 50 MCG/ACT nasal spray    Sig: Place 2 sprays into both nostrils daily.    Dispense:  16 g    Refill:  6    Orders Placed This Encounter  Procedures   Flu Vaccine Trivalent High Dose (Fluad)   CBC with Differential/Platelet   Comprehensive metabolic panel   Hemoglobin A1c   Lipid panel     Follow-up: Return in about 3 months (around 12/08/2023) for chronic fasting.   I,Marla I Leal-Borjas,acting as a scribe for Blane Ohara, MD.,have documented all relevant documentation on the behalf of Blane Ohara, MD,as directed by  Blane Ohara, MD while in the presence of Blane Ohara, MD.   An After Visit Summary was printed and given to the patient.  I attest that I have reviewed this visit and agree with the plan scribed by my staff.   Blane Ohara, MD [facility] [phone]

## 2023-09-08 LAB — CBC WITH DIFFERENTIAL/PLATELET
Basophils Absolute: 0.1 10*3/uL (ref 0.0–0.2)
Basos: 1 %
EOS (ABSOLUTE): 0.3 10*3/uL (ref 0.0–0.4)
Eos: 2 %
Hematocrit: 38 % (ref 37.5–51.0)
Hemoglobin: 11 g/dL — ABNORMAL LOW (ref 13.0–17.7)
Immature Grans (Abs): 0 10*3/uL (ref 0.0–0.1)
Immature Granulocytes: 0 %
Lymphocytes Absolute: 3.1 10*3/uL (ref 0.7–3.1)
Lymphs: 22 %
MCH: 23.5 pg — ABNORMAL LOW (ref 26.6–33.0)
MCHC: 28.9 g/dL — ABNORMAL LOW (ref 31.5–35.7)
MCV: 81 fL (ref 79–97)
Monocytes Absolute: 1.2 10*3/uL — ABNORMAL HIGH (ref 0.1–0.9)
Monocytes: 8 %
Neutrophils Absolute: 9.7 10*3/uL — ABNORMAL HIGH (ref 1.4–7.0)
Neutrophils: 67 %
Platelets: 196 10*3/uL (ref 150–450)
RBC: 4.69 x10E6/uL (ref 4.14–5.80)
RDW: 15.1 % (ref 11.6–15.4)
WBC: 14.3 10*3/uL — ABNORMAL HIGH (ref 3.4–10.8)

## 2023-09-08 LAB — COMPREHENSIVE METABOLIC PANEL
ALT: 20 [IU]/L (ref 0–44)
AST: 28 [IU]/L (ref 0–40)
Albumin: 4.1 g/dL (ref 3.8–4.8)
Alkaline Phosphatase: 117 [IU]/L (ref 44–121)
BUN/Creatinine Ratio: 16 (ref 10–24)
BUN: 34 mg/dL — ABNORMAL HIGH (ref 8–27)
Bilirubin Total: 1.2 mg/dL (ref 0.0–1.2)
CO2: 30 mmol/L — ABNORMAL HIGH (ref 20–29)
Calcium: 9.7 mg/dL (ref 8.6–10.2)
Chloride: 91 mmol/L — ABNORMAL LOW (ref 96–106)
Creatinine, Ser: 2.13 mg/dL — ABNORMAL HIGH (ref 0.76–1.27)
Globulin, Total: 2.5 g/dL (ref 1.5–4.5)
Glucose: 146 mg/dL — ABNORMAL HIGH (ref 70–99)
Potassium: 3.5 mmol/L (ref 3.5–5.2)
Sodium: 140 mmol/L (ref 134–144)
Total Protein: 6.6 g/dL (ref 6.0–8.5)
eGFR: 31 mL/min/{1.73_m2} — ABNORMAL LOW (ref >59)

## 2023-09-08 LAB — LIPID PANEL
Chol/HDL Ratio: 4 {ratio} (ref 0.0–5.0)
Cholesterol, Total: 140 mg/dL (ref 100–199)
HDL: 35 mg/dL — ABNORMAL LOW (ref >39)
LDL Chol Calc (NIH): 75 mg/dL (ref 0–99)
Triglycerides: 174 mg/dL — ABNORMAL HIGH (ref 0–149)
VLDL Cholesterol Cal: 30 mg/dL (ref 5–40)

## 2023-09-08 LAB — HEMOGLOBIN A1C
Est. average glucose Bld gHb Est-mCnc: 174 mg/dL
Hgb A1c MFr Bld: 7.7 % — ABNORMAL HIGH (ref 4.8–5.6)

## 2023-09-09 ENCOUNTER — Other Ambulatory Visit: Payer: Self-pay

## 2023-09-09 MED ORDER — ICOSAPENT ETHYL 1 G PO CAPS: 2.0000 g | ORAL_CAPSULE | Freq: Two times a day (BID) | ORAL | 2 refills | Status: DC

## 2023-09-12 ENCOUNTER — Encounter: Payer: Self-pay | Admitting: Family Medicine

## 2023-09-12 DIAGNOSIS — Z23 Encounter for immunization: Secondary | ICD-10-CM

## 2023-09-12 DIAGNOSIS — J301 Allergic rhinitis due to pollen: Secondary | ICD-10-CM | POA: Insufficient documentation

## 2023-09-12 HISTORY — DX: Allergic rhinitis due to pollen: J30.1

## 2023-09-12 HISTORY — DX: Encounter for immunization: Z23

## 2023-09-12 NOTE — Assessment & Plan Note (Signed)
Management per specialist. Treating for chronic pain.

## 2023-09-12 NOTE — Assessment & Plan Note (Signed)
Recommend continue to work on eating healthy diet and exercise.  

## 2023-09-12 NOTE — Assessment & Plan Note (Signed)
Recommend start flonase.

## 2023-09-13 ENCOUNTER — Other Ambulatory Visit: Payer: Medicare HMO | Admitting: Pharmacist

## 2023-09-13 NOTE — Progress Notes (Signed)
09/13/2023 Name: Shawn Sullivan MRN: [id number] DOB: 06-18-1946  Chief Complaint  Patient presents with   Medication Assistance    Care Coordination   S/O: Answered incoming call from patient, he is seeking status update of Breztri patient assistance.  Reviewed prior notes, he should have received medication based upon expected 10-14 business days from previous manufacturer update.  A/P: - Recommended patient contact [employer] for tracking status, advised to contact me again if any questions/concerns  Follow-up: none scheduled, patient has my contact information if needing future support   Lynnda Shields, PharmD, BCPS Clinical Pharmacist [facility]

## 2023-09-15 ENCOUNTER — Other Ambulatory Visit: Payer: Self-pay

## 2023-09-15 MED ORDER — BREZTRI AEROSPHERE 160-9-4.8 MCG/ACT IN AERO: 2.0000 | INHALATION_SPRAY | Freq: Two times a day (BID) | RESPIRATORY_TRACT | 3 refills | Status: DC

## 2023-09-16 ENCOUNTER — Other Ambulatory Visit: Payer: Self-pay | Admitting: Family Medicine

## 2023-09-22 DIAGNOSIS — N1832 Chronic kidney disease, stage 3b: Secondary | ICD-10-CM | POA: Diagnosis not present

## 2023-09-22 DIAGNOSIS — E876 Hypokalemia: Secondary | ICD-10-CM | POA: Diagnosis not present

## 2023-09-22 DIAGNOSIS — N2581 Secondary hyperparathyroidism of renal origin: Secondary | ICD-10-CM | POA: Diagnosis not present

## 2023-09-22 DIAGNOSIS — D631 Anemia in chronic kidney disease: Secondary | ICD-10-CM | POA: Diagnosis not present

## 2023-09-22 DIAGNOSIS — E669 Obesity, unspecified: Secondary | ICD-10-CM | POA: Diagnosis not present

## 2023-09-22 DIAGNOSIS — E1122 Type 2 diabetes mellitus with diabetic chronic kidney disease: Secondary | ICD-10-CM | POA: Diagnosis not present

## 2023-09-22 DIAGNOSIS — R600 Localized edema: Secondary | ICD-10-CM | POA: Diagnosis not present

## 2023-09-22 DIAGNOSIS — I129 Hypertensive chronic kidney disease with stage 1 through stage 4 chronic kidney disease, or unspecified chronic kidney disease: Secondary | ICD-10-CM | POA: Diagnosis not present

## 2023-09-22 DIAGNOSIS — E559 Vitamin D deficiency, unspecified: Secondary | ICD-10-CM | POA: Diagnosis not present

## 2023-09-23 ENCOUNTER — Other Ambulatory Visit: Payer: Self-pay | Admitting: Family Medicine

## 2023-09-27 ENCOUNTER — Telehealth: Payer: Self-pay

## 2023-09-27 NOTE — Telephone Encounter (Signed)
Patient's daughter Arline Asp called and stated that the patient still has not received his New upped dose of the Ozempic and was wanting to know if it has been sent or corrected? She is also requesting Breztri samples for the patient? Please advise.

## 2023-09-28 NOTE — Telephone Encounter (Signed)
According to patient's chart it still says Ozempic 1 mg weekly and you increased it to Ozempic 2 mg weekly, so I am assuming the new paper work for dose change has not been filled out. I have started the paper work for you to finish. Putting out samples of Breztri or patient.

## 2023-09-28 NOTE — Telephone Encounter (Signed)
Sent. Thank you for filling out. Dr. Sedalia Muta

## 2023-10-05 ENCOUNTER — Telehealth: Payer: Self-pay

## 2023-10-05 NOTE — Telephone Encounter (Signed)
Dose increase approved through 11/30/23 for Ozempic.  A four month supply will be shipped and should be received within 10-14 business days.

## 2023-10-07 ENCOUNTER — Telehealth: Payer: Self-pay

## 2023-10-07 ENCOUNTER — Other Ambulatory Visit: Payer: Self-pay | Admitting: Family Medicine

## 2023-10-07 NOTE — Telephone Encounter (Signed)
Patient made aware that Ozempic is here at the office ready for pick-up.  Rec 3 pens

## 2023-10-12 NOTE — Telephone Encounter (Signed)
Patients daughter picked up Ozempic.

## 2023-10-13 ENCOUNTER — Telehealth: Payer: Self-pay

## 2023-10-13 NOTE — Telephone Encounter (Signed)
Patient assistance applications for Eliquis, [facility], Farxiga, Tresiba, and Ozempic left at front desk for [name] to come by and pick-up with instructions.

## 2023-10-14 DIAGNOSIS — Z008 Encounter for other general examination: Secondary | ICD-10-CM | POA: Diagnosis not present

## 2023-10-28 ENCOUNTER — Other Ambulatory Visit: Payer: Self-pay | Admitting: Family Medicine

## 2023-11-05 ENCOUNTER — Telehealth: Payer: Self-pay

## 2023-11-05 NOTE — Telephone Encounter (Signed)
Patient picked up applications for [place], Ozempic [employer]), Bernadene Bell ([employer]) and Eliquis [facility] [employer]) at providers office and it was faxed back to me.  Patient has to meet 3% out of pocket to be eligible for [facility] so we will submit that one in 2025.  [employer] and [employer] were both faxed to companies with completed application.

## 2023-11-08 DIAGNOSIS — M533 Sacrococcygeal disorders, not elsewhere classified: Secondary | ICD-10-CM | POA: Diagnosis not present

## 2023-11-08 DIAGNOSIS — M961 Postlaminectomy syndrome, not elsewhere classified: Secondary | ICD-10-CM | POA: Diagnosis not present

## 2023-11-08 DIAGNOSIS — G894 Chronic pain syndrome: Secondary | ICD-10-CM | POA: Diagnosis not present

## 2023-11-09 ENCOUNTER — Telehealth: Payer: Self-pay

## 2023-11-09 NOTE — Telephone Encounter (Signed)
Called and spoke with patient's wife and informed her that we have received the patient's Patient Assistance for Ozempic 1 mg.  2 boxes- Ozempic 1 mg  NDC# [id number] Lot: [id number] Exp: 06/29/2026

## 2023-11-15 ENCOUNTER — Other Ambulatory Visit: Payer: Self-pay | Admitting: Family Medicine

## 2023-11-23 ENCOUNTER — Other Ambulatory Visit: Payer: Self-pay | Admitting: Cardiology

## 2023-11-25 NOTE — Telephone Encounter (Signed)
Prescription sent to pharmacy.

## 2023-12-02 NOTE — Telephone Encounter (Signed)
 Daughter picked up medicine.

## 2023-12-02 NOTE — Telephone Encounter (Signed)
 Spoke with Neysa Bonito at [employer] patient did not check required boxes in section D & E, corrected application and resubmitted to [employer]

## 2023-12-15 NOTE — Telephone Encounter (Signed)
 PAP: Patient assistance application Breztri  and Farxiga  for has been approved by PAP Companies: [employer] from 12/01/2023 to 11/29/2024. Medication should be delivered to PAP Delivery: Home For further shipping updates, please contact [employer] ([employer]) at [phone] Pt ID is: [id number]  Spoke with Shaniqua at [employer]-pt[employer]s application is going through insurance verification status and should have determination in a few days.

## 2023-12-19 NOTE — Progress Notes (Unsigned)
Subjective:  Patient ID: Shawn Sullivan, male    DOB: 1946-10-19  Age: 78 y.o. MRN: [id number]  Chief Complaint  Patient presents with   Medical Management of Chronic Issues    HPI History of Present Illness The patient, with a history of diabetes, heart disease, and chronic back pain, presents with swollen feet. The swelling is more pronounced in one foot than the other. Despite taking a diuretic medication, the swelling persists. The patient denies increased salt intake and reports no change in diet. It fluctuates and will often go back down.   The patient's diabetes is managed with Beaulah Corin, and Ozempic. His blood sugar levels range from 130 to 160, and his last A1c was 7.7. The patient is due for an eye appointment, which is crucial for monitoring potential diabetic retinopathy.  The patient's heart disease is managed with Crestor, torsemide, metoprolol, metolazone, amiodarone, and Eliquis. The patient takes additional potassium supplements due to the diuretic medication. The patient's daughter reports that the cost of Eliquis is a significant financial burden.  The patient's chronic back pain is managed through a pain clinic.   The patient also takes Zoloft for depression and omeprazole for acid reflux.   The patient uses an inhaler Markus Daft) for breathing issues and reports shortness of breath when walking.  The patient also reports muscle pain in his knees and back and requests a steroid pack for relief. However, the doctor and the patient's daughter express concern about the potential side effects of steroids, including increased blood sugar levels and mood changes. Diabetes:  Complications: neuropathy.  Glucose checking: daily Glucose logs: 130-160 fasting. Hypoglycemia: no Most recent A1C: 7.7 Current medications: farxiga 10 mg daily, tresiba 60 U daily. Taking ozempic 1 mg weekly.  Last Eye Exam: due for an appt.  Foot checks: continues to check daily.     Hyperlipidemia: Current medications: crestor 40 mg daily and coenzyme q10.    Hypertension: Complications: CKD. Kidney function stable.  Current medications: on metoprolol tartrate 50 mg three times a day, torsemide 20 mg 2 po twice daily, metolazone 5 mg once a wk as needed swelling. and potassium 20 meq 2 tablet twicedaily.  Medications: checked and unchanged. Swelling has improved.    Atrial fibrillation: on amiodarone 200 mg daily, metoprolol 50 mg three times a day and eliquis 5 mg twice daily. Dr. Dulce Sellar gave good report.   RLS: on requip 0.5 mg once daily as needed.   Chronic pain syndrome: Sees pain clinic.   Mild recurrent depression: on zoloft 50 mg twice daily   GERD: on omeprazole 20 mg 1 pill  oral twice daily   COPD: Breztri 2 puffs twice daily. Using duoneb treatments/albuterol hfa once a day also on singulair and xyzal prn   Diet: not healthy. Eating late at night. exercise: walking some.       12/20/2023   11:07 AM 08/12/2023    2:08 PM 02/15/2023   11:23 AM 11/09/2022   11:16 AM 08/20/2022   12:01 PM  Depression screen PHQ 2/9  Decreased Interest 1 0 0 0 0  Down, Depressed, Hopeless 0 0 0 0 0  PHQ - 2 Score 1 0 0 0 0  Altered sleeping 3  0  3  Tired, decreased energy 3  0  3  Change in appetite 0  0  3  Feeling bad or failure about yourself  0  0  0  Trouble concentrating 0  0  0  Moving slowly  or fidgety/restless 0  0  0  Suicidal thoughts 0  0  0  PHQ-9 Score 7  0  9  Difficult doing work/chores Not difficult at all  Not difficult at all  Somewhat difficult        08/12/2023    2:12 PM  Fall Risk   Falls in the past year? 0  Number falls in past yr: 0  Injury with Fall? 0  Risk for fall due to : No Fall Risks  Follow up Falls prevention discussed    Patient Care Team: Blane Ohara, MD as PCP - General (Family Medicine) Zettie Pho, [facility] (Inactive) (Pharmacist) Baldo Daub, MD as Consulting Physician (Cardiology) Marzetta Board, PA-C  as Physician Assistant (Physician Assistant) Olegario Shearer, MD as Referring Physician (Dermatology) Maxie Barb, MD as Consulting Physician (Nephrology)   Review of Systems  Constitutional:  Negative for chills, diaphoresis, fatigue and fever.  HENT:  Negative for congestion, ear pain and sore throat.   Respiratory:  Negative for cough and shortness of breath.   Cardiovascular:  Positive for leg swelling. Negative for chest pain.  Gastrointestinal:  Negative for abdominal pain, constipation, diarrhea, nausea and vomiting.  Endocrine: Positive for polyuria.  Genitourinary:  Negative for dysuria and urgency.  Musculoskeletal:  Positive for arthralgias. Negative for myalgias.  Neurological:  Negative for dizziness and headaches.  Psychiatric/Behavioral:  Negative for dysphoric mood.     Current Outpatient Medications on File Prior to Visit  Medication Sig Dispense Refill   acetaminophen (TYLENOL) 500 MG tablet Take 1,000 mg by mouth every 6 (six) hours as needed for moderate pain or headache.     amiodarone (PACERONE) 200 MG tablet TAKE ONE TABLET BY MOUTH EVERY EVENING 90 tablet 3   BD PEN NEEDLE MICRO U/F 32G X 6 MM MISC SMARTSIG:1 Needle SUB-Q Daily     Budeson-Glycopyrrol-Formoterol (BREZTRI AEROSPHERE) 160-9-4.8 MCG/ACT AERO Inhale 2 puffs into the lungs 2 (two) times daily. 10.7 g 3   cholecalciferol (VITAMIN D3) 25 MCG (1000 UT) tablet Take 1,000 Units by mouth daily.      dapagliflozin propanediol (FARXIGA) 10 MG TABS tablet Take 1 tablet (10 mg total) by mouth daily before breakfast. 90 tablet 3   ELIQUIS 5 MG TABS tablet Take 1 tablet by mouth twice daily 180 tablet 0   fluticasone (FLONASE) 50 MCG/ACT nasal spray Place 2 sprays into both nostrils daily. 16 g 6   folic acid (FOLVITE) 800 MCG tablet Take 800 mcg by mouth daily.     HYDROcodone-acetaminophen (NORCO) 10-325 MG tablet Take 1 tablet by mouth every 4 (four) hours as needed for moderate pain.     icosapent  Ethyl (VASCEPA) 1 g capsule Take 2 capsules (2 g total) by mouth 2 (two) times daily. 120 capsule 2   insulin degludec (TRESIBA FLEXTOUCH) 100 UNIT/ML FlexTouch Pen Inject 60 Units into the skin daily.     ipratropium-albuterol (DUONEB) 0.5-2.5 (3) MG/3ML SOLN INHALE 1 VIAL VIA NEBULIZER EVERY 6 HOURS AS NEEDED FOR SHORTNESS OF BREATH 360 mL 2   levocetirizine (XYZAL) 5 MG tablet Take 5 mg by mouth daily as needed for allergies.     metolazone (ZAROXOLYN) 5 MG tablet Take 5 mg by mouth once a week. Uses prn if swollen once weekly.     metoprolol tartrate (LOPRESSOR) 50 MG tablet TAKE 1 TABLET BY MOUTH EVERY MORNING, TAKE 1 TABLET IN THE EVENING AND TAKE 1 TABLET AT BEDTIME 90 tablet 6   montelukast (  SINGULAIR) 10 MG tablet TAKE ONE TABLET BY MOUTH EVERYDAY AT BEDTIME 90 tablet 2   Omeprazole 20 MG TBEC Take 20 mg by mouth 2 (two) times daily before a meal.      potassium chloride SA (KLOR-CON M) 20 MEQ tablet TAKE 2 TABLETS BY MOUTH AT BREAKFAST AND AT BEDTIME 120 tablet 10   rOPINIRole (REQUIP) 1 MG tablet Take 1 tablet (1 mg total) by mouth at bedtime. 30 tablet 2   rosuvastatin (CRESTOR) 40 MG tablet TAKE 1 TABLET BY MOUTH EVERY DAY AT BEDTIME 30 tablet 10   Semaglutide, 1 MG/DOSE, (OZEMPIC, 1 MG/DOSE,) 2 MG/1.5ML SOPN Inject 1 mg into the skin once a week. 9 mL 3   sertraline (ZOLOFT) 50 MG tablet TAKE 1 TABLET BY MOUTH AT BREAKFAST AND AT BEDTIME 60 tablet 10   tamsulosin (FLOMAX) 0.4 MG CAPS capsule TAKE ONE CAPSULE BY MOUTH EVERYDAY AT BEDTIME 90 capsule 2   tiZANidine (ZANAFLEX) 2 MG tablet TAKE 1 TABLET BY MOUTH EVERY DAY AT BEDTIME 30 tablet 10   torsemide (DEMADEX) 20 MG tablet TAKE TWO (2) TABLETS BY MOUTH EACH MORNING AND TAKE 2 TABLETS AT NOON 120 tablet 10   VENTOLIN HFA 108 (90 Base) MCG/ACT inhaler INHALE 2 PUFFS BY MOUTH EVERY 4 HOURS AS NEEDED FOR SHORTNESS OF BREATH AND WHEEZING 18 g 10   No current facility-administered medications on file prior to visit.   Past Medical  History:  Diagnosis Date   Anticoagulated 07/01/2018   Anticoagulated 07/01/2018   Benign essential hypertension 05/06/2013   Chronic obstructive pulmonary disease (HCC) 11/05/2015   COPD (chronic obstructive pulmonary disease) (HCC) 07/01/2018   Dermatochalasis 12/16/2015   Disorder of bursae and tendons in shoulder region 01/05/2014   Generalized edema 06/22/2016   Hyperlipidemia 12/16/2015   Hypertensive heart disease 07/01/2018   Low back pain 08/17/2013   Lumbosacral spondylosis 05/06/2013   Obstructive sleep apnea 07/01/2018   Persistent atrial fibrillation (HCC) 07/01/2018   Piriformis syndrome 05/03/2014   Postlaminectomy syndrome, lumbar region 05/06/2013   Presbyopia of both eyes 12/16/2015   Restless legs syndrome 03/23/2016   Restrictive lung disease 06/22/2016   SI (sacroiliac) pain 08/12/2020   Sleep apnea 05/06/2013   Status post intraocular lens implant 12/16/2015   Type 2 diabetes mellitus without complications (HCC) 11/05/2015   Past Surgical History:  Procedure Laterality Date   APPENDECTOMY     BACK SURGERY     CARDIOVERSION N/A 12/01/2018   Procedure: CARDIOVERSION;  Surgeon: Thurmon Fair, MD;  Location: [facility];  Service: Cardiovascular;  Laterality: N/A;   CATARACT EXTRACTION Bilateral    LUMBAR FUSION     NECK SURGERY      Family History  Problem Relation Age of Onset   Atrial fibrillation Mother    Cancer Mother    Heart disease Father    Social History   Socioeconomic History   Marital status: Married    Spouse name: Not on file   Number of children: Not on file   Years of education: Not on file   Highest education level: Not on file  Occupational History   Not on file  Tobacco Use   Smoking status: Former    Current packs/day: 0.00    Types: Cigarettes    Quit date: 1993    Years since quitting: 32.0   Smokeless tobacco: Never  Vaping Use   Vaping status: Never Used  Substance and Sexual Activity   Alcohol use: Not  Currently   Drug  use: Not Currently    Types: Hydrocodone   Sexual activity: Not on file  Other Topics Concern   Not on file  Social History Narrative   Not on file   Social Drivers of Health   Financial Resource Strain: Low Risk  (08/12/2023)   Overall Financial Resource Strain (CARDIA)    Difficulty of Paying Living Expenses: Not hard at all  Food Insecurity: No Food Insecurity (08/12/2023)   Hunger Vital Sign    Worried About Running Out of Food in the Last Year: Never true    Ran Out of Food in the Last Year: Never true  Transportation Needs: No Transportation Needs (08/12/2023)   PRAPARE - Administrator, Civil Service (Medical): No    Lack of Transportation (Non-Medical): No  Physical Activity: Inactive (08/12/2023)   Exercise Vital Sign    Days of Exercise per Week: 0 days    Minutes of Exercise per Session: 0 min  Stress: No Stress Concern Present (08/12/2023)   [employer] of Occupational Health - Occupational Stress Questionnaire    Feeling of Stress : Not at all  Social Connections: Socially Integrated (08/12/2023)   Social Connection and Isolation Panel [NHANES]    Frequency of Communication with Friends and Family: More than three times a week    Frequency of Social Gatherings with Friends and Family: More than three times a week    Attends Religious Services: More than 4 times per year    Active Member of [employer] or Organizations: Yes    Attends Engineer, structural: More than 4 times per year    Marital Status: Married    Objective:  BP 132/78   Pulse (!) 105   Temp 97.8 F (36.6 C)   Ht 6\' 1"  (1.854 m)   Wt (!) 317 lb (143.8 kg)   SpO2 95%   BMI 41.82 kg/m      12/20/2023   11:03 AM 09/07/2023   11:14 AM 08/23/2023    1:46 PM  BP/Weight  Systolic BP 132 130 112  Diastolic BP 78 76 64  Wt. (Lbs) 317 312 317  BMI 41.82 kg/m2 40.28 kg/m2 42.99 kg/m2    Physical Exam Vitals reviewed.  Constitutional:      Appearance: Normal  appearance.  Neck:     Vascular: No carotid bruit.  Cardiovascular:     Rate and Rhythm: Normal rate and regular rhythm.     Pulses: Normal pulses.     Heart sounds: Murmur heard.  Pulmonary:     Effort: Pulmonary effort is normal.     Breath sounds: Normal breath sounds. No wheezing, rhonchi or rales.  Abdominal:     General: Bowel sounds are normal.     Palpations: Abdomen is soft.     Tenderness: There is no abdominal tenderness.  Musculoskeletal:     Right lower leg: Edema present.     Left lower leg: Edema (Left more than right.) present.  Neurological:     Mental Status: He is alert and oriented to person, place, and time.  Psychiatric:        Mood and Affect: Mood normal.        Behavior: Behavior normal.     Diabetic Foot Exam - Simple   Simple Foot Form  12/20/2023  7:29 PM  Visual Inspection No deformities, no ulcerations, no other skin breakdown bilaterally: Yes Sensation Testing See comments: Yes Pulse Check Posterior Tibialis and Dorsalis pulse intact bilaterally: Yes Comments Decreased  sensation      Lab Results  Component Value Date   WBC 14.3 (H) 09/07/2023   HGB 11.0 (L) 09/07/2023   HCT 38.0 09/07/2023   PLT 196 09/07/2023   GLUCOSE 146 (H) 09/07/2023   CHOL 140 09/07/2023   TRIG 174 (H) 09/07/2023   HDL 35 (L) 09/07/2023   LDLCALC 75 09/07/2023   ALT 20 09/07/2023   AST 28 09/07/2023   NA 140 09/07/2023   K 3.5 09/07/2023   CL 91 (L) 09/07/2023   CREATININE 2.13 (H) 09/07/2023   BUN 34 (H) 09/07/2023   CO2 30 (H) 09/07/2023   TSH 3.790 11/09/2022   INR 1.1 11/25/2018   HGBA1C 7.7 (H) 09/07/2023   MICROALBUR 80 01/14/2021      Assessment & Plan:    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  No changes to medicines. Continue on metoprolol tartrate 50 mg three times a day, torsemide 20 mg 2  po twice daily, metolazone 5 mg once a wk as needed swelling. and potassium 20 meq two twice daily.  Continue to work on eating a healthy diet and exercise.  Labs drawn today.    Orders: -     CBC with Differential/Platelet -     Comprehensive metabolic panel  Mixed hyperlipidemia Assessment & Plan: Well controlled.  No changes to medicines. Continue crestor 40 mg before bed and coenzyme q10 daily.  Continue to work on eating a healthy diet and exercise.  Labs drawn today.    Orders: -     Lipid panel  Simple chronic bronchitis (HCC) Assessment & Plan: Stable.  Continue Breztri 2 puffs twice daily. Using duoneb treatments/albuterol hfa once a day a most.  .also on singulair and xyzal prn   Diabetic polyneuropathy associated with type 2 diabetes mellitus (HCC) Assessment & Plan: Control: good Recommend check sugars fasting daily. Recommend check feet daily. Recommend annual eye exams. Medicines: Continue : farxiga 10 mg daily, tresiba 60 U daily.Taking ozempic 1 mg weekly.    Continue to work on eating a healthy diet and exercise.  Labs drawn today.     Orders: -     Hemoglobin A1c -     Microalbumin / creatinine urine ratio  PAF (paroxysmal atrial fibrillation) (HCC) Assessment & Plan: Continue amiodarone 200 mg daily, metoprolol 50 mg three times a day and eliquis 5 mg twice daily. Issue with cost of eliquis. Refer to pharmacy.   Uncomplicated opioid dependence (HCC) Assessment & Plan: Management per specialist. Treating for chronic pain.   Class 3 severe obesity due to excess calories with serious comorbidity and body mass index (BMI) of 40.0 to 44.9 in adult [facility]) Assessment & Plan: Recommend continue to work on eating healthy diet and exercise.    Stage 3b chronic kidney disease (HCC) Assessment & Plan: Stable.   Obstructive sleep apnea Assessment & Plan: Continue cpap.    General Health Maintenance -Ensure completion of diabetic eye  exam. -Confirm receipt of Shingles and Tetanus vaccines.  No orders of the defined types were placed in this encounter.   Orders Placed This Encounter  Procedures   CBC with Differential/Platelet   Comprehensive metabolic panel   Hemoglobin A1c   Lipid panel   Microalbumin / creatinine urine ratio     Follow-up: Return in about 3 months (around 03/19/2024) for chronic fasting.   I,Marla I Leal-Borjas,acting as  a scribe for Blane Ohara, MD.,have documented all relevant documentation on the behalf of Blane Ohara, MD,as directed by  Blane Ohara, MD while in the presence of Blane Ohara, MD.   An After Visit Summary was printed and given to the patient.  I attest that I have reviewed this visit and agree with the plan scribed by my staff.   Blane Ohara, MD [facility] [phone]

## 2023-12-20 ENCOUNTER — Telehealth: Payer: Self-pay | Admitting: Pharmacist

## 2023-12-20 ENCOUNTER — Ambulatory Visit (INDEPENDENT_AMBULATORY_CARE_PROVIDER_SITE_OTHER): Payer: Medicare HMO | Admitting: Family Medicine

## 2023-12-20 ENCOUNTER — Encounter: Payer: Self-pay | Admitting: Family Medicine

## 2023-12-20 VITALS — BP 132/78 | HR 105 | Temp 97.8°F | Ht 73.0 in | Wt 317.0 lb

## 2023-12-20 DIAGNOSIS — E66813 Obesity, class 3: Secondary | ICD-10-CM

## 2023-12-20 DIAGNOSIS — F1120 Opioid dependence, uncomplicated: Secondary | ICD-10-CM

## 2023-12-20 DIAGNOSIS — G4733 Obstructive sleep apnea (adult) (pediatric): Secondary | ICD-10-CM

## 2023-12-20 DIAGNOSIS — Z6841 Body Mass Index (BMI) 40.0 and over, adult: Secondary | ICD-10-CM | POA: Diagnosis not present

## 2023-12-20 DIAGNOSIS — I480 Paroxysmal atrial fibrillation: Secondary | ICD-10-CM | POA: Diagnosis not present

## 2023-12-20 DIAGNOSIS — E782 Mixed hyperlipidemia: Secondary | ICD-10-CM

## 2023-12-20 DIAGNOSIS — E1142 Type 2 diabetes mellitus with diabetic polyneuropathy: Secondary | ICD-10-CM | POA: Diagnosis not present

## 2023-12-20 DIAGNOSIS — I130 Hypertensive heart and chronic kidney disease with heart failure and stage 1 through stage 4 chronic kidney disease, or unspecified chronic kidney disease: Secondary | ICD-10-CM

## 2023-12-20 DIAGNOSIS — I5041 Acute combined systolic (congestive) and diastolic (congestive) heart failure: Secondary | ICD-10-CM

## 2023-12-20 DIAGNOSIS — J410 Simple chronic bronchitis: Secondary | ICD-10-CM | POA: Diagnosis not present

## 2023-12-20 DIAGNOSIS — N1832 Chronic kidney disease, stage 3b: Secondary | ICD-10-CM | POA: Diagnosis not present

## 2023-12-20 NOTE — Assessment & Plan Note (Signed)
Stable.  Continue Breztri 2 puffs twice daily. Using duoneb treatments/albuterol hfa once a day a most.  .also on singulair and xyzal prn

## 2023-12-20 NOTE — Assessment & Plan Note (Signed)
Management per specialist. Treating for chronic pain.

## 2023-12-20 NOTE — Assessment & Plan Note (Signed)
Well controlled.  No changes to medicines. Continue crestor 40 mg before bed and coenzyme q10 daily.  Continue to work on eating a healthy diet and exercise.  Labs drawn today.

## 2023-12-20 NOTE — Assessment & Plan Note (Signed)
Stable

## 2023-12-20 NOTE — Assessment & Plan Note (Signed)
Continue cpap.  

## 2023-12-20 NOTE — Assessment & Plan Note (Signed)
Well controlled.  No changes to medicines. Continue on metoprolol tartrate 50 mg three times a day, torsemide 20 mg 2 po twice daily, metolazone 5 mg once a wk as needed swelling. and potassium 20 meq two twice daily.  Continue to work on eating a healthy diet and exercise.  Labs drawn today.

## 2023-12-20 NOTE — Assessment & Plan Note (Signed)
Control: good Recommend check sugars fasting daily. Recommend check feet daily. Recommend annual eye exams. Medicines: Continue : farxiga 10 mg daily, tresiba 60 U daily.Taking ozempic 1 mg weekly.    Continue to work on eating a healthy diet and exercise.  Labs drawn today.

## 2023-12-20 NOTE — Progress Notes (Unsigned)
   12/20/2023  Patient ID: Shawn Sullivan, male   DOB: [birth year], 78 y.o.   MRN: [id number]  Called patient's daughter [name] [phone] per request by patient regarding Eliquis application. Left voicemail requesting call back at her earliest convenience to discuss this further.   Will discuss what the 3% OOP Rx spend amount that would be required for 2025 to qualify for enrollment.   Currently, patient has a $590 drug deductible that will need paid prior to insurance coverage for Eliquis. This first month should be about $599 which would satisfy this requirement. Each fill afterwards would be $143.   Update from 12/21/23: Spoke with Arline Asp regarding the above. She reports likely going to apply for [employer] as they received a letter in the mail stating they would likely qualify. Advised it takes about 3 weeks to get approval in the mail.    Marlowe Aschoff, PharmD [facility] Phone Number: [phone]

## 2023-12-20 NOTE — Assessment & Plan Note (Signed)
Continue amiodarone 200 mg daily, metoprolol 50 mg three times a day and eliquis 5 mg twice daily. Issue with cost of eliquis. Refer to pharmacy.

## 2023-12-20 NOTE — Assessment & Plan Note (Signed)
Recommend continue to work on eating healthy diet and exercise.  

## 2023-12-20 NOTE — Patient Instructions (Signed)
VISIT SUMMARY:  During today's visit, we discussed your ongoing health issues, including the swelling in your feet, diabetes management, heart disease, chronic back pain, and other conditions. We reviewed your current medications and made plans to address your concerns, including the cost of Eliquis and the need for a diabetic eye exam.  YOUR PLAN:  -LOWER EXTREMITY EDEMA: Lower extremity edema means swelling in the lower legs and feet. You will continue your current diuretic medications, Torsemide and Metolazone, to help manage this swelling.  -TYPE 2 DIABETES MELLITUS: Type 2 diabetes is a condition where your body does not use insulin properly, leading to high blood sugar levels. Your blood sugar levels are currently between 130-160, and your last A1c was 7.7. You will continue your current medications (Farxiga, [name], and Ozempic) and schedule a diabetic eye exam.  -HYPERLIPIDEMIA: Hyperlipidemia means having high levels of fats (lipids) in your blood. You will continue taking Crestor 40mg  daily along with coenzyme Q10.  -HYPERTENSION/HEART FAILURE: Hypertension is high blood pressure, and heart failure means your heart is not pumping blood as well as it should. You will continue taking Metoprolol 50mg  three times daily.  -ATRIAL FIBRILLATION: Atrial fibrillation is an irregular and often rapid heart rate. You will continue taking Amiodarone 200mg  daily, Metoprolol 50mg  three times daily, and Eliquis 5mg  twice daily. I will have our pharmacist; Raven Powers, reach out to you to help with your Eliquis.  -CHRONIC BACK PAIN: Chronic back pain is long-term pain in your back. Your pain management plan will continue as directed by your pain clinic.  -DEPRESSION: Depression is a mood disorder that causes persistent feelings of sadness and loss of interest. You will continue taking Zoloft 50mg  twice daily.  -GASTROESOPHAGEAL REFLUX DISEASE: Gastroesophageal reflux disease (GERD) is a condition  where stomach acid frequently flows back into the tube connecting your mouth and stomach. You will continue taking Omeprazole.  -CHRONIC OBSTRUCTIVE PULMONARY DISEASE: Chronic obstructive pulmonary disease (COPD) is a chronic inflammatory lung disease that causes obstructed airflow from the lungs. You will continue using the Breztri inhaler and Albuterol as needed.  -GENERAL HEALTH MAINTENANCE: For your general health, ensure you complete your diabetic eye exam and confirm you have received your Shingles and Tetanus vaccines.  INSTRUCTIONS:  Please schedule your diabetic eye exam as soon as possible. We will also work with a Teacher, early years/pre and insurance agent to help manage the cost of Eliquis.  Reach out to [employer] or Nanda Quinton for help with insurance.

## 2023-12-21 LAB — LIPID PANEL
Chol/HDL Ratio: 3.3 {ratio} (ref 0.0–5.0)
Cholesterol, Total: 144 mg/dL (ref 100–199)
HDL: 43 mg/dL (ref >39)
LDL Chol Calc (NIH): 79 mg/dL (ref 0–99)
Triglycerides: 123 mg/dL (ref 0–149)
VLDL Cholesterol Cal: 22 mg/dL (ref 5–40)

## 2023-12-21 LAB — CBC WITH DIFFERENTIAL/PLATELET
Basophils Absolute: 0.1 10*3/uL (ref 0.0–0.2)
Basos: 1 %
EOS (ABSOLUTE): 0.2 10*3/uL (ref 0.0–0.4)
Eos: 2 %
Hematocrit: 49.2 % (ref 37.5–51.0)
Hemoglobin: 15.2 g/dL (ref 13.0–17.7)
Immature Grans (Abs): 0 10*3/uL (ref 0.0–0.1)
Immature Granulocytes: 0 %
Lymphocytes Absolute: 2.2 10*3/uL (ref 0.7–3.1)
Lymphs: 26 %
MCH: 28.1 pg (ref 26.6–33.0)
MCHC: 30.9 g/dL — ABNORMAL LOW (ref 31.5–35.7)
MCV: 91 fL (ref 79–97)
Monocytes Absolute: 0.7 10*3/uL (ref 0.1–0.9)
Monocytes: 8 %
Neutrophils Absolute: 5.5 10*3/uL (ref 1.4–7.0)
Neutrophils: 63 %
Platelets: 174 10*3/uL (ref 150–450)
RBC: 5.4 x10E6/uL (ref 4.14–5.80)
RDW: 15 % (ref 11.6–15.4)
WBC: 8.5 10*3/uL (ref 3.4–10.8)

## 2023-12-21 LAB — COMPREHENSIVE METABOLIC PANEL
ALT: 22 [IU]/L (ref 0–44)
AST: 32 [IU]/L (ref 0–40)
Albumin: 4 g/dL (ref 3.8–4.8)
Alkaline Phosphatase: 109 [IU]/L (ref 44–121)
BUN/Creatinine Ratio: 10 (ref 10–24)
BUN: 16 mg/dL (ref 8–27)
Bilirubin Total: 1.1 mg/dL (ref 0.0–1.2)
CO2: 32 mmol/L — ABNORMAL HIGH (ref 20–29)
Calcium: 9.8 mg/dL (ref 8.6–10.2)
Chloride: 98 mmol/L (ref 96–106)
Creatinine, Ser: 1.67 mg/dL — ABNORMAL HIGH (ref 0.76–1.27)
Globulin, Total: 2.7 g/dL (ref 1.5–4.5)
Glucose: 87 mg/dL (ref 70–99)
Potassium: 4 mmol/L (ref 3.5–5.2)
Sodium: 147 mmol/L — ABNORMAL HIGH (ref 134–144)
Total Protein: 6.7 g/dL (ref 6.0–8.5)
eGFR: 41 mL/min/{1.73_m2} — ABNORMAL LOW (ref >59)

## 2023-12-21 LAB — MICROALBUMIN / CREATININE URINE RATIO
Creatinine, Urine: 17.7 mg/dL
Microalb/Creat Ratio: <17 mg/g{creat} (ref 0–29)
Microalbumin, Urine: <3 ug/mL

## 2023-12-21 LAB — HEMOGLOBIN A1C
Est. average glucose Bld gHb Est-mCnc: 143 mg/dL
Hgb A1c MFr Bld: 6.6 % — ABNORMAL HIGH (ref 4.8–5.6)

## 2023-12-23 ENCOUNTER — Telehealth: Payer: Self-pay | Admitting: Cardiology

## 2023-12-23 ENCOUNTER — Other Ambulatory Visit: Payer: Self-pay

## 2023-12-23 DIAGNOSIS — I480 Paroxysmal atrial fibrillation: Secondary | ICD-10-CM

## 2023-12-23 MED ORDER — APIXABAN 5 MG PO TABS: 5.0000 mg | ORAL_TABLET | Freq: Two times a day (BID) | ORAL | 0 refills | Status: DC

## 2023-12-23 NOTE — Telephone Encounter (Signed)
Called patient and informed him that he had a couple boxes of Eliquis samples in the office for him to pick up. Patient verbalized understanding and had no further questions at this time.

## 2023-12-23 NOTE — Telephone Encounter (Signed)
Patient calling the office for samples of medication:   1.  What medication and dosage are you requesting samples for? ELIQUIS 5 MG TABS tablet   2.  Are you currently out of this medication? Yes    

## 2023-12-23 NOTE — Telephone Encounter (Signed)
Called patient to inform him that we had set aside samples of Eliquis for him to pick up, but he did not answer the phone. Left message for him to call back.

## 2023-12-23 NOTE — Telephone Encounter (Signed)
Sample request for Eliquis received. Indication: Afib  Last office visit: 06/02/23 [facility])  Scr: 1.67 (12/20/23)  Age: 78 Weight: 143.8kg  Eliquis 5mg  BID is appropriate dose.

## 2023-12-24 ENCOUNTER — Other Ambulatory Visit: Payer: Self-pay | Admitting: Family Medicine

## 2023-12-27 NOTE — Progress Notes (Signed)
Pharmacy Medication Assistance Program Note    12/27/2023  Patient ID: Shawn Sullivan, male   DOB: 12/15/1945, 78 y.o.   MRN: [id number]     11/05/2023  Outreach [employer]ication [employer]  Manufacturer Medication One Nurse, adult Drugs Bretztri  Dose of Breztri 160/9/4.8  Dose of Farxiga 10mg /day  Date Application Received From Patient 11/05/2023  Application Items Received From Patient Application;Proof of Income  Date Application Received From Provider 11/05/2023  Date Application Submitted to Manufacturer 11/05/2023  Method Application Sent to Manufacturer Fax  Patient Assistance Determination Approved  Approval Start Date 12/01/2023  Approval End Date 11/29/2024  Patient Notification Method Telephone Call     Signature

## 2023-12-27 NOTE — Telephone Encounter (Signed)
PAP: Patient assistance application for Ozempic has been approved by PAP Companies: [employer] from 12/25/2023 to 11/29/2024. Medication should be delivered to PAP Delivery: Provider's office. For further shipping updates, please [employer] at [phone]. Patient ID is: [id number]

## 2024-01-11 ENCOUNTER — Telehealth: Payer: Self-pay

## 2024-01-11 NOTE — Telephone Encounter (Signed)
Called and left detailed voicemail for patient informing him of received patient assistance received for patient to pick up.    Ozempic: 1 mg 4 boxes  Lot# [id number] EXP: 07/30/2026 NDC- [phone]   [name]: 5 BOXES LOT# [id number] EXP: 04/29/26 NDC: [phone]   Novofine needles: 2 BOXES LOT # [id number] EXP: 05/29/2028 NDC [phone]

## 2024-01-18 NOTE — Telephone Encounter (Signed)
Patients daughter picked up patient assistance

## 2024-01-24 ENCOUNTER — Other Ambulatory Visit: Payer: Self-pay | Admitting: Family Medicine

## 2024-02-05 ENCOUNTER — Emergency Department (HOSPITAL_BASED_OUTPATIENT_CLINIC_OR_DEPARTMENT_OTHER)

## 2024-02-05 ENCOUNTER — Other Ambulatory Visit: Payer: Self-pay

## 2024-02-05 ENCOUNTER — Inpatient Hospital Stay (HOSPITAL_BASED_OUTPATIENT_CLINIC_OR_DEPARTMENT_OTHER)
Admission: EM | Admit: 2024-02-05 | Discharge: 2024-02-09 | DRG: 728 | Disposition: A | Discharge status: Home Health | Attending: Internal Medicine | Admitting: Internal Medicine

## 2024-02-05 DIAGNOSIS — Z6841 Body Mass Index (BMI) 40.0 and over, adult: Secondary | ICD-10-CM

## 2024-02-05 DIAGNOSIS — N453 Epididymo-orchitis: Principal | ICD-10-CM | POA: Diagnosis present

## 2024-02-05 DIAGNOSIS — N492 Inflammatory disorders of scrotum: Principal | ICD-10-CM | POA: Diagnosis present

## 2024-02-05 DIAGNOSIS — I480 Paroxysmal atrial fibrillation: Secondary | ICD-10-CM | POA: Diagnosis present

## 2024-02-05 DIAGNOSIS — R5381 Other malaise: Secondary | ICD-10-CM | POA: Diagnosis present

## 2024-02-05 DIAGNOSIS — Z7984 Long term (current) use of oral hypoglycemic drugs: Secondary | ICD-10-CM

## 2024-02-05 DIAGNOSIS — N1832 Chronic kidney disease, stage 3b: Secondary | ICD-10-CM | POA: Diagnosis present

## 2024-02-05 DIAGNOSIS — J984 Other disorders of lung: Secondary | ICD-10-CM

## 2024-02-05 DIAGNOSIS — E1122 Type 2 diabetes mellitus with diabetic chronic kidney disease: Secondary | ICD-10-CM | POA: Diagnosis present

## 2024-02-05 DIAGNOSIS — Z886 Allergy status to analgesic agent status: Secondary | ICD-10-CM

## 2024-02-05 DIAGNOSIS — Z87891 Personal history of nicotine dependence: Secondary | ICD-10-CM

## 2024-02-05 DIAGNOSIS — R161 Splenomegaly, not elsewhere classified: Secondary | ICD-10-CM | POA: Diagnosis present

## 2024-02-05 DIAGNOSIS — R1900 Intra-abdominal and pelvic swelling, mass and lump, unspecified site: Secondary | ICD-10-CM | POA: Diagnosis present

## 2024-02-05 DIAGNOSIS — I130 Hypertensive heart and chronic kidney disease with heart failure and stage 1 through stage 4 chronic kidney disease, or unspecified chronic kidney disease: Secondary | ICD-10-CM | POA: Diagnosis present

## 2024-02-05 DIAGNOSIS — N5089 Other specified disorders of the male genital organs: Secondary | ICD-10-CM | POA: Diagnosis not present

## 2024-02-05 DIAGNOSIS — E66813 Obesity, class 3: Secondary | ICD-10-CM | POA: Diagnosis present

## 2024-02-05 DIAGNOSIS — Z7985 Long-term (current) use of injectable non-insulin antidiabetic drugs: Secondary | ICD-10-CM

## 2024-02-05 DIAGNOSIS — J449 Chronic obstructive pulmonary disease, unspecified: Secondary | ICD-10-CM | POA: Diagnosis present

## 2024-02-05 DIAGNOSIS — Z7951 Long term (current) use of inhaled steroids: Secondary | ICD-10-CM

## 2024-02-05 DIAGNOSIS — C480 Malignant neoplasm of retroperitoneum: Secondary | ICD-10-CM | POA: Diagnosis present

## 2024-02-05 DIAGNOSIS — D72829 Elevated white blood cell count, unspecified: Secondary | ICD-10-CM | POA: Diagnosis present

## 2024-02-05 DIAGNOSIS — G4733 Obstructive sleep apnea (adult) (pediatric): Secondary | ICD-10-CM | POA: Diagnosis present

## 2024-02-05 DIAGNOSIS — J441 Chronic obstructive pulmonary disease with (acute) exacerbation: Secondary | ICD-10-CM | POA: Diagnosis present

## 2024-02-05 DIAGNOSIS — Z79899 Other long term (current) drug therapy: Secondary | ICD-10-CM

## 2024-02-05 DIAGNOSIS — G2581 Restless legs syndrome: Secondary | ICD-10-CM | POA: Diagnosis present

## 2024-02-05 DIAGNOSIS — F32A Depression, unspecified: Secondary | ICD-10-CM | POA: Diagnosis present

## 2024-02-05 DIAGNOSIS — N400 Enlarged prostate without lower urinary tract symptoms: Secondary | ICD-10-CM | POA: Diagnosis present

## 2024-02-05 DIAGNOSIS — F419 Anxiety disorder, unspecified: Secondary | ICD-10-CM | POA: Diagnosis present

## 2024-02-05 DIAGNOSIS — Z7901 Long term (current) use of anticoagulants: Secondary | ICD-10-CM

## 2024-02-05 DIAGNOSIS — K449 Diaphragmatic hernia without obstruction or gangrene: Secondary | ICD-10-CM | POA: Diagnosis present

## 2024-02-05 DIAGNOSIS — Z888 Allergy status to other drugs, medicaments and biological substances status: Secondary | ICD-10-CM

## 2024-02-05 DIAGNOSIS — R1909 Other intra-abdominal and pelvic swelling, mass and lump: Secondary | ICD-10-CM | POA: Diagnosis present

## 2024-02-05 DIAGNOSIS — E441 Mild protein-calorie malnutrition: Secondary | ICD-10-CM | POA: Diagnosis present

## 2024-02-05 DIAGNOSIS — E876 Hypokalemia: Secondary | ICD-10-CM | POA: Diagnosis present

## 2024-02-05 DIAGNOSIS — C499 Malignant neoplasm of connective and soft tissue, unspecified: Secondary | ICD-10-CM

## 2024-02-05 DIAGNOSIS — Z794 Long term (current) use of insulin: Secondary | ICD-10-CM

## 2024-02-05 DIAGNOSIS — Z881 Allergy status to other antibiotic agents status: Secondary | ICD-10-CM

## 2024-02-05 DIAGNOSIS — N433 Hydrocele, unspecified: Secondary | ICD-10-CM | POA: Diagnosis not present

## 2024-02-05 DIAGNOSIS — I700 Atherosclerosis of aorta: Secondary | ICD-10-CM | POA: Diagnosis present

## 2024-02-05 DIAGNOSIS — K219 Gastro-esophageal reflux disease without esophagitis: Secondary | ICD-10-CM | POA: Diagnosis present

## 2024-02-05 DIAGNOSIS — R609 Edema, unspecified: Secondary | ICD-10-CM | POA: Diagnosis not present

## 2024-02-05 DIAGNOSIS — Z8249 Family history of ischemic heart disease and other diseases of the circulatory system: Secondary | ICD-10-CM

## 2024-02-05 DIAGNOSIS — A419 Sepsis, unspecified organism: Secondary | ICD-10-CM | POA: Diagnosis not present

## 2024-02-05 DIAGNOSIS — K7460 Unspecified cirrhosis of liver: Secondary | ICD-10-CM | POA: Diagnosis present

## 2024-02-05 DIAGNOSIS — E785 Hyperlipidemia, unspecified: Secondary | ICD-10-CM | POA: Diagnosis present

## 2024-02-05 DIAGNOSIS — I4819 Other persistent atrial fibrillation: Secondary | ICD-10-CM | POA: Diagnosis present

## 2024-02-05 DIAGNOSIS — I5042 Chronic combined systolic (congestive) and diastolic (congestive) heart failure: Secondary | ICD-10-CM | POA: Diagnosis present

## 2024-02-05 DIAGNOSIS — H524 Presbyopia: Secondary | ICD-10-CM | POA: Diagnosis present

## 2024-02-05 DIAGNOSIS — E1142 Type 2 diabetes mellitus with diabetic polyneuropathy: Secondary | ICD-10-CM | POA: Diagnosis present

## 2024-02-05 DIAGNOSIS — D696 Thrombocytopenia, unspecified: Secondary | ICD-10-CM | POA: Diagnosis present

## 2024-02-05 DIAGNOSIS — N5082 Scrotal pain: Secondary | ICD-10-CM | POA: Diagnosis not present

## 2024-02-05 DIAGNOSIS — K8020 Calculus of gallbladder without cholecystitis without obstruction: Secondary | ICD-10-CM | POA: Diagnosis present

## 2024-02-05 LAB — URINALYSIS, ROUTINE W REFLEX MICROSCOPIC
Bilirubin Urine: NEGATIVE
Glucose, UA: >=500 mg/dL — AB
Ketones, ur: NEGATIVE mg/dL
Leukocytes,Ua: NEGATIVE
Nitrite: NEGATIVE
Protein, ur: NEGATIVE mg/dL
Specific Gravity, Urine: 1.01 (ref 1.005–1.030)
pH: 6.5 (ref 5.0–8.0)

## 2024-02-05 LAB — URINALYSIS, MICROSCOPIC (REFLEX)

## 2024-02-05 MED ORDER — PIPERACILLIN-TAZOBACTAM 3.375 G IVPB 30 MIN
3.3750 g | Freq: Once | INTRAVENOUS | Status: AC
  Administered 2024-02-06: 3.375 g via INTRAVENOUS
  Filled 2024-02-05: qty 50

## 2024-02-05 MED ORDER — LINEZOLID 600 MG/300ML IV SOLN
600.0000 mg | Freq: Once | INTRAVENOUS | Status: AC
  Administered 2024-02-06: 600 mg via INTRAVENOUS
  Filled 2024-02-05: qty 300

## 2024-02-05 MED ORDER — FENTANYL CITRATE PF 50 MCG/ML IJ SOSY
50.0000 ug | PREFILLED_SYRINGE | Freq: Once | INTRAMUSCULAR | Status: AC
  Administered 2024-02-06: 50 ug via INTRAVENOUS
  Filled 2024-02-05: qty 1

## 2024-02-05 MED ORDER — ONDANSETRON HCL 4 MG/2ML IJ SOLN
4.0000 mg | Freq: Three times a day (TID) | INTRAMUSCULAR | Status: DC | PRN
  Filled 2024-02-05: qty 2

## 2024-02-05 MED ORDER — LACTATED RINGERS IV BOLUS
1000.0000 mL | Freq: Once | INTRAVENOUS | Status: AC
  Administered 2024-02-06: 1000 mL via INTRAVENOUS

## 2024-02-05 NOTE — ED Triage Notes (Signed)
 Pt POV in wheelchair- pt c/o scrotal swelling x2-3 days after receiving a scratch to area while cleaning area. Denies discharge.   Took hydrocodone appx 1500 today, mild relief.

## 2024-02-05 NOTE — ED Provider Notes (Signed)
 [place] [facility]MENT AT [facility] Provider Note   CSN: [id number] Arrival date & time: 02/05/24  1952     History  Chief Complaint  Patient presents with   Groin Swelling    Shawn Sullivan is a 78 y.o. male.  HPI   78 year old male with medical history significant for atrial fibrillation on Eliquis, HTN, COPD, DM2 presenting to the emergency department with scrotal pain and swelling.  The patient states about 3 days ago he was cleaning his scrotum when he nicked it with his fingernails.  He states that he has since developed pain and swelling of his scrotum that has progressively worsened over the past day.  Denies any abdominal pain, nausea or vomiting, denies any fevers or chills.  He states that his pain is currently 6 out of 10 in severity.  He scrotal swelling progressed rapidly over the past few hours.  Home Medications Prior to Admission medications   Medication Sig Start Date End Date Taking? Authorizing Provider  acetaminophen (TYLENOL) 500 MG tablet Take 1,000 mg by mouth every 6 (six) hours as needed for moderate pain or headache.    [provider]  amiodarone (PACERONE) 200 MG tablet TAKE ONE TABLET BY MOUTH EVERY EVENING 07/14/23   Baldo Daub, MD  apixaban (ELIQUIS) 5 MG TABS tablet Take 1 tablet (5 mg total) by mouth 2 (two) times daily. 12/23/23   Baldo Daub, MD  BD PEN NEEDLE MICRO U/F 32G X 6 MM MISC SMARTSIG:1 Needle SUB-Q Daily 02/14/20   [provider]  Budeson-Glycopyrrol-Formoterol (BREZTRI AEROSPHERE) 160-9-4.8 MCG/ACT AERO Inhale 2 puffs into the lungs 2 (two) times daily. 09/15/23   CoxFritzi Mandes, MD  cholecalciferol (VITAMIN D3) 25 MCG (1000 UT) tablet Take 1,000 Units by mouth daily.     [provider]  dapagliflozin propanediol (FARXIGA) 10 MG TABS tablet Take 1 tablet (10 mg total) by mouth daily before breakfast. 09/01/22   Cox, Kirsten, MD  ELIQUIS 5 MG TABS tablet Take 1 tablet by mouth twice  daily 11/15/23   Cox, Kirsten, MD  fluticasone [facility]) 50 MCG/ACT nasal spray Place 2 sprays into both nostrils daily. 09/07/23   CoxFritzi Mandes, MD  folic acid (FOLVITE) 800 MCG tablet Take 800 mcg by mouth daily.    [provider]  HYDROcodone-acetaminophen (NORCO) 10-325 MG tablet Take 1 tablet by mouth every 4 (four) hours as needed for moderate pain. 05/17/17   [provider]  icosapent Ethyl (VASCEPA) 1 g capsule Take 2 capsules (2 g total) by mouth 2 (two) times daily. 09/09/23   Cox, Fritzi Mandes, MD  insulin degludec (TRESIBA FLEXTOUCH) 100 UNIT/ML FlexTouch Pen Inject 60 Units into the skin daily.    [provider]  ipratropium-albuterol (DUONEB) 0.5-2.5 (3) MG/3ML SOLN INHALE THE CONTENTS OF 1 VIAL VIA NEBULIZER EVERY 6 HOURS AS NEEDED FOR SHORTNESS OF BREATH 12/25/23   Cox, Fritzi Mandes, MD  levocetirizine (XYZAL) 5 MG tablet Take 5 mg by mouth daily as needed for allergies.    [provider]  metolazone (ZAROXOLYN) 5 MG tablet Take 5 mg by mouth once a week. Uses prn if swollen once weekly. 04/26/20   [provider]  metoprolol tartrate (LOPRESSOR) 50 MG tablet TAKE 1 TABLET BY MOUTH EVERY MORNING, TAKE 1 TABLET IN THE EVENING AND TAKE 1 TABLET AT BEDTIME 11/25/23   Baldo Daub, MD  montelukast (SINGULAIR) 10 MG tablet TAKE 1 TABLET BY MOUTH EVERY DAY AT BEDTIME 01/24/24  Cox, Kirsten, MD  Omeprazole 20 MG TBEC Take 20 mg by mouth 2 (two) times daily before a meal.  06/25/14   [provider]  potassium chloride SA (KLOR-CON M) 20 MEQ tablet TAKE 2 TABLETS BY MOUTH AT BREAKFAST AND AT BEDTIME 10/29/23   Cox, Kirsten, MD  rOPINIRole (REQUIP) 1 MG tablet Take 1 tablet (1 mg total) by mouth at bedtime. 09/07/23   Cox, Fritzi Mandes, MD  rosuvastatin (CRESTOR) 40 MG tablet TAKE 1 TABLET BY MOUTH EVERY DAY AT BEDTIME 10/29/23   Cox, Kirsten, MD  Semaglutide, 1 MG/DOSE, (OZEMPIC, 1 MG/DOSE,) 2 MG/1.5ML SOPN Inject 1 mg into the skin once a week. 03/24/23    Cox, Fritzi Mandes, MD  sertraline (ZOLOFT) 50 MG tablet TAKE 1 TABLET BY MOUTH AT BREAKFAST AND AT BEDTIME 10/29/23   Cox, Fritzi Mandes, MD  tamsulosin (FLOMAX) 0.4 MG CAPS capsule TAKE 1 CAPSULE BY MOUTH EVERY DAY AT BEDTIME 01/24/24   Cox, Kirsten, MD  tiZANidine (ZANAFLEX) 2 MG tablet TAKE 1 TABLET BY MOUTH EVERY DAY AT BEDTIME 10/29/23   Cox, Kirsten, MD  torsemide (DEMADEX) 20 MG tablet TAKE TWO (2) TABLETS BY MOUTH EACH MORNING AND TAKE 2 TABLETS AT NOON 10/29/23   Cox, Fritzi Mandes, MD  VENTOLIN HFA 108 (90 Base) MCG/ACT inhaler INHALE 2 PUFFS BY MOUTH EVERY 4 HOURS AS NEEDED FOR SHORTNESS OF BREATH AND WHEEZING 08/19/23   Cox, Fritzi Mandes, MD      Allergies    Androgel [testosterone], Biaxin [clarithromycin], Duloxetine, Gabapentin, Ibuprofen, Lyrica [pregabalin], and Spironolactone    Review of Systems   Review of Systems  All other systems reviewed and are negative.   Physical Exam Updated Vital Signs BP 137/80 (BP Location: Right Arm)   Pulse (!) 101   Temp 97.7 F (36.5 C)   Resp 16   Ht 6\' 1"  (1.854 m)   Wt (!) 142.4 kg   SpO2 95%   BMI 41.43 kg/m  Physical Exam Vitals and nursing note reviewed. Exam conducted with a chaperone present.  Constitutional:      General: He is not in acute distress.    Appearance: He is obese.  HENT:     Head: Normocephalic and atraumatic.  Eyes:     Conjunctiva/sclera: Conjunctivae normal.     Pupils: Pupils are equal, round, and reactive to light.  Cardiovascular:     Rate and Rhythm: Normal rate and regular rhythm.  Pulmonary:     Effort: Pulmonary effort is normal. No respiratory distress.  Abdominal:     General: There is no distension.     Tenderness: There is no guarding.  Genitourinary:    Comments: Scrotal and penile swelling with surrounding erythema and warmth, no clear palpable crepitus, tenderness to palpation of the scrotum with a skin thickening Musculoskeletal:        General: No deformity or signs of injury.     Cervical back:  Neck supple.  Skin:    Findings: No lesion or rash.  Neurological:     General: No focal deficit present.     Mental Status: He is alert. Mental status is at baseline.     ED Results / Procedures / Treatments   Labs (all labs ordered are listed, but only abnormal results are displayed) Labs Reviewed  URINALYSIS, ROUTINE W REFLEX MICROSCOPIC - Abnormal; Notable for the following components:      Result Value   Glucose, UA >=500 (*)    Hgb urine dipstick TRACE (*)    All other  components within normal limits  URINALYSIS, MICROSCOPIC (REFLEX) - Abnormal; Notable for the following components:   Bacteria, UA RARE (*)    All other components within normal limits  URINE CULTURE  GC/CHLAMYDIA PROBE AMP ([place]) NOT AT [facility]    EKG None  Radiology US Scrotum Result Date: 02/05/2024 CLINICAL DATA:  scrotal pain and swelling EXAM: ULTRASOUND OF SCROTUM TECHNIQUE: Complete ultrasound examination of the testicles, epididymis, and other scrotal structures was performed. COMPARISON:  None Available. FINDINGS: Right testicle Measurements: 3.9 x 3 x 3.5 cm. Heterogeneous echogenicity. An area of diminished echogenicity peripherally but no discrete mass. Blood flow is demonstrated. Left testicle Measurements: 4.1 x 2.1 x 3.4 cm. Homogeneous echogenicity. No mass or microlithiasis visualized. Blood flow is demonstrated. Right epididymis:  Enlarged, heterogeneous and hyperemic. Left epididymis:  Not well delineated. Hydrocele:  Large complex on the right with internal septations. Varicocele:  None visualized. Diffuse scrotal skin thickening and hyperemia. The degree of scrotal edema limits assessment. IMPRESSION: 1. Enlarged heterogeneous hyperemic right epididymis consistent with epididymitis. The adjacent right testicle is also heterogeneous, suspicious for orchitis. 2. Diffuse scrotal skin thickening and edema, may be reactive or cellulitis. No discrete subcutaneous fluid collection. If there is  concern for soft tissue gas, recommend CT. 3. Complex right hydrocele. Electronically Signed   By: Narda Rutherford M.D.   On: 02/05/2024 21:12    Procedures Procedures    Medications Ordered in ED Medications - No data to display  ED Course/ Medical Decision Making/ A&P                                 Medical Decision Making Amount and/or Complexity of Data Reviewed Labs: ordered. Radiology: ordered.  Risk Prescription drug management.    78 year old male with medical history significant for atrial fibrillation on Eliquis, HTN, COPD, DM2 presenting to the emergency department with scrotal pain and swelling.  The patient states about 3 days ago he was cleaning his scrotum when he nicked it with his fingernails.  He states that he has since developed pain and swelling of his scrotum that has progressively worsened over the past day.  Denies any abdominal pain, nausea or vomiting, denies any fevers or chills.  He states that his pain is currently 6 out of 10 in severity.  He scrotal swelling progressed rapidly over the past few hours.  On arrival, the patient was afebrile, tachycardic heart rate 101, RR 16, BP 137/80, saturating 95% on room air.  Physical exam revealed scrotal and penile swelling with surrounding erythema and warmth with no clear palpable crepitus but tenderness to palpation of the scrotum with associated skin thickening.  Findings are concerning for scrotal cellulitis.  Additionally considered scrotal abscess and necrotizing fasciitis.  IV access was obtained, the patient was administered an IV fluid bolus, IV fentanyl for pain control and was covered with IV linezolid and Zosyn.  Initial laboratory evaluation from triage revealed a urinalysis without clear evidence of UTI.  Add on additional laboratory evaluation to include blood cultures x 2, CBC, CMP, lactic acid.  Ultrasound of the scrotum ordered in triage revealed: IMPRESSION:  1. Enlarged heterogeneous  hyperemic right epididymis consistent with  epididymitis. The adjacent right testicle is also heterogeneous,  suspicious for orchitis.  2. Diffuse scrotal skin thickening and edema, may be reactive or  cellulitis. No discrete subcutaneous fluid collection. If there is  concern for soft tissue gas, recommend CT.  3. Complex right hydrocele.   CT Abdomen Pelvis:***  Urology Consult: [facility] medicine consulted for admission.   Final Clinical Impression(s) / ED Diagnoses Final diagnoses:  None    Rx / DC Orders ED Discharge Orders     None

## 2024-02-05 NOTE — ED Notes (Signed)
 Pt transported to [place]

## 2024-02-06 ENCOUNTER — Emergency Department (HOSPITAL_BASED_OUTPATIENT_CLINIC_OR_DEPARTMENT_OTHER)

## 2024-02-06 ENCOUNTER — Encounter (HOSPITAL_BASED_OUTPATIENT_CLINIC_OR_DEPARTMENT_OTHER): Payer: Self-pay

## 2024-02-06 DIAGNOSIS — E785 Hyperlipidemia, unspecified: Secondary | ICD-10-CM | POA: Diagnosis not present

## 2024-02-06 DIAGNOSIS — E876 Hypokalemia: Secondary | ICD-10-CM | POA: Diagnosis not present

## 2024-02-06 DIAGNOSIS — D696 Thrombocytopenia, unspecified: Secondary | ICD-10-CM

## 2024-02-06 DIAGNOSIS — K8020 Calculus of gallbladder without cholecystitis without obstruction: Secondary | ICD-10-CM | POA: Diagnosis not present

## 2024-02-06 DIAGNOSIS — R161 Splenomegaly, not elsewhere classified: Secondary | ICD-10-CM | POA: Diagnosis not present

## 2024-02-06 DIAGNOSIS — K219 Gastro-esophageal reflux disease without esophagitis: Secondary | ICD-10-CM | POA: Diagnosis not present

## 2024-02-06 DIAGNOSIS — D72829 Elevated white blood cell count, unspecified: Secondary | ICD-10-CM | POA: Diagnosis not present

## 2024-02-06 DIAGNOSIS — K7460 Unspecified cirrhosis of liver: Secondary | ICD-10-CM | POA: Diagnosis not present

## 2024-02-06 DIAGNOSIS — R1900 Intra-abdominal and pelvic swelling, mass and lump, unspecified site: Secondary | ICD-10-CM | POA: Diagnosis present

## 2024-02-06 DIAGNOSIS — N453 Epididymo-orchitis: Secondary | ICD-10-CM | POA: Diagnosis not present

## 2024-02-06 DIAGNOSIS — Z7901 Long term (current) use of anticoagulants: Secondary | ICD-10-CM | POA: Diagnosis not present

## 2024-02-06 DIAGNOSIS — I5042 Chronic combined systolic (congestive) and diastolic (congestive) heart failure: Secondary | ICD-10-CM | POA: Diagnosis present

## 2024-02-06 DIAGNOSIS — Z794 Long term (current) use of insulin: Secondary | ICD-10-CM | POA: Diagnosis not present

## 2024-02-06 DIAGNOSIS — I4819 Other persistent atrial fibrillation: Secondary | ICD-10-CM | POA: Diagnosis not present

## 2024-02-06 DIAGNOSIS — E441 Mild protein-calorie malnutrition: Secondary | ICD-10-CM

## 2024-02-06 DIAGNOSIS — F32A Depression, unspecified: Secondary | ICD-10-CM | POA: Diagnosis not present

## 2024-02-06 DIAGNOSIS — N492 Inflammatory disorders of scrotum: Secondary | ICD-10-CM

## 2024-02-06 DIAGNOSIS — I700 Atherosclerosis of aorta: Secondary | ICD-10-CM | POA: Diagnosis present

## 2024-02-06 DIAGNOSIS — Z7985 Long-term (current) use of injectable non-insulin antidiabetic drugs: Secondary | ICD-10-CM | POA: Diagnosis not present

## 2024-02-06 DIAGNOSIS — I130 Hypertensive heart and chronic kidney disease with heart failure and stage 1 through stage 4 chronic kidney disease, or unspecified chronic kidney disease: Secondary | ICD-10-CM | POA: Diagnosis not present

## 2024-02-06 DIAGNOSIS — N400 Enlarged prostate without lower urinary tract symptoms: Secondary | ICD-10-CM | POA: Diagnosis not present

## 2024-02-06 DIAGNOSIS — G2581 Restless legs syndrome: Secondary | ICD-10-CM | POA: Diagnosis not present

## 2024-02-06 DIAGNOSIS — C480 Malignant neoplasm of retroperitoneum: Secondary | ICD-10-CM | POA: Diagnosis not present

## 2024-02-06 DIAGNOSIS — E1142 Type 2 diabetes mellitus with diabetic polyneuropathy: Secondary | ICD-10-CM | POA: Diagnosis not present

## 2024-02-06 DIAGNOSIS — K449 Diaphragmatic hernia without obstruction or gangrene: Secondary | ICD-10-CM | POA: Diagnosis not present

## 2024-02-06 DIAGNOSIS — N1832 Chronic kidney disease, stage 3b: Secondary | ICD-10-CM | POA: Diagnosis not present

## 2024-02-06 DIAGNOSIS — J449 Chronic obstructive pulmonary disease, unspecified: Secondary | ICD-10-CM | POA: Diagnosis not present

## 2024-02-06 DIAGNOSIS — Z6841 Body Mass Index (BMI) 40.0 and over, adult: Secondary | ICD-10-CM | POA: Diagnosis not present

## 2024-02-06 DIAGNOSIS — N5089 Other specified disorders of the male genital organs: Secondary | ICD-10-CM | POA: Diagnosis not present

## 2024-02-06 DIAGNOSIS — E66813 Obesity, class 3: Secondary | ICD-10-CM | POA: Diagnosis not present

## 2024-02-06 DIAGNOSIS — E1122 Type 2 diabetes mellitus with diabetic chronic kidney disease: Secondary | ICD-10-CM | POA: Diagnosis not present

## 2024-02-06 HISTORY — DX: Atherosclerosis of aorta: I70.0

## 2024-02-06 HISTORY — DX: Other disorders of bilirubin metabolism: E80.6

## 2024-02-06 HISTORY — DX: Splenomegaly, not elsewhere classified: R16.1

## 2024-02-06 HISTORY — DX: Mild protein-calorie malnutrition: E44.1

## 2024-02-06 HISTORY — DX: Thrombocytopenia, unspecified: D69.6

## 2024-02-06 HISTORY — DX: Inflammatory disorders of scrotum: N49.2

## 2024-02-06 HISTORY — DX: Chronic combined systolic (congestive) and diastolic (congestive) heart failure: I50.42

## 2024-02-06 HISTORY — DX: Intra-abdominal and pelvic swelling, mass and lump, unspecified site: R19.00

## 2024-02-06 LAB — CBC WITH DIFFERENTIAL/PLATELET
Abs Immature Granulocytes: 0.05 10*3/uL (ref 0.00–0.07)
Basophils Absolute: 0 10*3/uL (ref 0.0–0.1)
Basophils Relative: 0 %
Eosinophils Absolute: 0.1 10*3/uL (ref 0.0–0.5)
Eosinophils Relative: 1 %
HCT: 44.7 % (ref 39.0–52.0)
Hemoglobin: 14.3 g/dL (ref 13.0–17.0)
Immature Granulocytes: 0 %
Lymphocytes Relative: 11 %
Lymphs Abs: 1.4 10*3/uL (ref 0.7–4.0)
MCH: 29.3 pg (ref 26.0–34.0)
MCHC: 32 g/dL (ref 30.0–36.0)
MCV: 91.6 fL (ref 80.0–100.0)
Monocytes Absolute: 0.9 10*3/uL (ref 0.1–1.0)
Monocytes Relative: 7 %
Neutro Abs: 10.5 10*3/uL — ABNORMAL HIGH (ref 1.7–7.7)
Neutrophils Relative %: 81 %
Platelets: 120 10*3/uL — ABNORMAL LOW (ref 150–400)
RBC: 4.88 MIL/uL (ref 4.22–5.81)
RDW: 16.1 % — ABNORMAL HIGH (ref 11.5–15.5)
WBC: 13.1 10*3/uL — ABNORMAL HIGH (ref 4.0–10.5)
nRBC: 0 % (ref 0.0–0.2)

## 2024-02-06 LAB — COMPREHENSIVE METABOLIC PANEL
ALT: 19 U/L (ref 0–44)
AST: 27 U/L (ref 15–41)
Albumin: 3.3 g/dL — ABNORMAL LOW (ref 3.5–5.0)
Alkaline Phosphatase: 87 U/L (ref 38–126)
Anion gap: 10 (ref 5–15)
BUN: 20 mg/dL (ref 8–23)
CO2: 26 mmol/L (ref 22–32)
Calcium: 9.1 mg/dL (ref 8.9–10.3)
Chloride: 102 mmol/L (ref 98–111)
Creatinine, Ser: 1.43 mg/dL — ABNORMAL HIGH (ref 0.61–1.24)
GFR, Estimated: 50 mL/min — ABNORMAL LOW (ref >60)
Glucose, Bld: 155 mg/dL — ABNORMAL HIGH (ref 70–99)
Potassium: 3.5 mmol/L (ref 3.5–5.1)
Sodium: 138 mmol/L (ref 135–145)
Total Bilirubin: 1.7 mg/dL — ABNORMAL HIGH (ref 0.0–1.2)
Total Protein: 7 g/dL (ref 6.5–8.1)

## 2024-02-06 LAB — GLUCOSE, CAPILLARY
Glucose-Capillary: 179 mg/dL — ABNORMAL HIGH (ref 70–99)
Glucose-Capillary: 148 mg/dL — ABNORMAL HIGH (ref 70–99)
Glucose-Capillary: 173 mg/dL — ABNORMAL HIGH (ref 70–99)

## 2024-02-06 LAB — LACTIC ACID, PLASMA: Lactic Acid, Venous: 1.3 mmol/L (ref 0.5–1.9)

## 2024-02-06 LAB — CBG MONITORING, ED: Glucose-Capillary: 160 mg/dL — ABNORMAL HIGH (ref 70–99)

## 2024-02-06 MED ORDER — ALBUTEROL SULFATE (2.5 MG/3ML) 0.083% IN NEBU: 2.5000 mg | INHALATION_SOLUTION | RESPIRATORY_TRACT | Status: DC | PRN

## 2024-02-06 MED ORDER — INSULIN ASPART 100 UNIT/ML IJ SOLN: 0.0000 [IU] | Freq: Every day | INTRAMUSCULAR | Status: DC

## 2024-02-06 MED ORDER — TIZANIDINE HCL 2 MG PO TABS
2.0000 mg | ORAL_TABLET | Freq: Every day | ORAL | Status: DC
  Administered 2024-02-06 – 2024-02-08 (×3): 2 mg via ORAL
  Filled 2024-02-06 (×3): qty 1

## 2024-02-06 MED ORDER — LACTATED RINGERS IV SOLN: INTRAVENOUS | Status: DC

## 2024-02-06 MED ORDER — METOLAZONE 5 MG PO TABS: 5.0000 mg | ORAL_TABLET | ORAL | Status: DC

## 2024-02-06 MED ORDER — POTASSIUM CHLORIDE CRYS ER 20 MEQ PO TBCR
40.0000 meq | EXTENDED_RELEASE_TABLET | Freq: Every day | ORAL | Status: DC
  Administered 2024-02-06 – 2024-02-07 (×2): 40 meq via ORAL
  Filled 2024-02-06 (×2): qty 2

## 2024-02-06 MED ORDER — TORSEMIDE 20 MG PO TABS
40.0000 mg | ORAL_TABLET | Freq: Two times a day (BID) | ORAL | Status: DC
  Administered 2024-02-06 – 2024-02-09 (×6): 40 mg via ORAL
  Filled 2024-02-06 (×7): qty 2

## 2024-02-06 MED ORDER — SERTRALINE HCL 50 MG PO TABS
50.0000 mg | ORAL_TABLET | Freq: Two times a day (BID) | ORAL | Status: DC
  Administered 2024-02-06 – 2024-02-09 (×7): 50 mg via ORAL
  Filled 2024-02-06 (×7): qty 1

## 2024-02-06 MED ORDER — ROSUVASTATIN CALCIUM 10 MG PO TABS
40.0000 mg | ORAL_TABLET | Freq: Every day | ORAL | Status: DC
  Administered 2024-02-06 – 2024-02-08 (×3): 40 mg via ORAL
  Filled 2024-02-06 (×3): qty 4

## 2024-02-06 MED ORDER — METOPROLOL TARTRATE 50 MG PO TABS
50.0000 mg | ORAL_TABLET | Freq: Two times a day (BID) | ORAL | Status: DC
Start: 2024-02-06 — End: 2024-02-09
  Administered 2024-02-06 – 2024-02-09 (×7): 50 mg via ORAL
  Filled 2024-02-06 (×7): qty 1

## 2024-02-06 MED ORDER — HYDROMORPHONE HCL 1 MG/ML IJ SOLN: 0.5000 mg | INTRAMUSCULAR | Status: DC | PRN

## 2024-02-06 MED ORDER — OXYCODONE HCL 5 MG PO TABS
5.0000 mg | ORAL_TABLET | ORAL | Status: DC | PRN
  Administered 2024-02-07 – 2024-02-08 (×3): 5 mg via ORAL
  Filled 2024-02-06 (×3): qty 1

## 2024-02-06 MED ORDER — PIPERACILLIN-TAZOBACTAM 3.375 G IVPB
3.3750 g | Freq: Three times a day (TID) | INTRAVENOUS | Status: DC
  Administered 2024-02-06 – 2024-02-09 (×10): 3.375 g via INTRAVENOUS
  Filled 2024-02-06 (×9): qty 50

## 2024-02-06 MED ORDER — PANTOPRAZOLE SODIUM 40 MG PO TBEC
40.0000 mg | DELAYED_RELEASE_TABLET | Freq: Every day | ORAL | Status: DC
  Administered 2024-02-06 – 2024-02-09 (×4): 40 mg via ORAL
  Filled 2024-02-06 (×4): qty 1

## 2024-02-06 MED ORDER — ROPINIROLE HCL 1 MG PO TABS
1.0000 mg | ORAL_TABLET | Freq: Every day | ORAL | Status: DC
  Administered 2024-02-06 – 2024-02-08 (×3): 1 mg via ORAL
  Filled 2024-02-06 (×4): qty 1

## 2024-02-06 MED ORDER — MONTELUKAST SODIUM 10 MG PO TABS
10.0000 mg | ORAL_TABLET | Freq: Every day | ORAL | Status: DC
  Administered 2024-02-06 – 2024-02-08 (×3): 10 mg via ORAL
  Filled 2024-02-06 (×3): qty 1

## 2024-02-06 MED ORDER — APIXABAN 2.5 MG PO TABS: 5.0000 mg | ORAL_TABLET | Freq: Two times a day (BID) | ORAL | Status: DC

## 2024-02-06 MED ORDER — ACETAMINOPHEN 325 MG PO TABS: 650.0000 mg | ORAL_TABLET | Freq: Four times a day (QID) | ORAL | Status: DC | PRN

## 2024-02-06 MED ORDER — INSULIN GLARGINE 100 UNIT/ML ~~LOC~~ SOLN
60.0000 [IU] | Freq: Every day | SUBCUTANEOUS | Status: DC
  Administered 2024-02-06 – 2024-02-09 (×3): 60 [IU] via SUBCUTANEOUS
  Filled 2024-02-06 (×4): qty 0.6

## 2024-02-06 MED ORDER — INSULIN ASPART 100 UNIT/ML IJ SOLN
0.0000 [IU] | Freq: Three times a day (TID) | INTRAMUSCULAR | Status: DC
  Administered 2024-02-06: 3 [IU] via SUBCUTANEOUS
  Administered 2024-02-06 – 2024-02-07 (×3): 2 [IU] via SUBCUTANEOUS
  Administered 2024-02-07: 3 [IU] via SUBCUTANEOUS
  Administered 2024-02-08 (×2): 2 [IU] via SUBCUTANEOUS

## 2024-02-06 MED ORDER — UMECLIDINIUM BROMIDE 62.5 MCG/ACT IN AEPB
1.0000 | INHALATION_SPRAY | Freq: Every day | RESPIRATORY_TRACT | Status: DC
  Administered 2024-02-06 – 2024-02-09 (×4): 1 via RESPIRATORY_TRACT
  Filled 2024-02-06: qty 7

## 2024-02-06 MED ORDER — FLUTICASONE FUROATE-VILANTEROL 200-25 MCG/ACT IN AEPB
1.0000 | INHALATION_SPRAY | Freq: Every day | RESPIRATORY_TRACT | Status: DC
  Administered 2024-02-06 – 2024-02-09 (×4): 1 via RESPIRATORY_TRACT
  Filled 2024-02-06: qty 28

## 2024-02-06 MED ORDER — IPRATROPIUM-ALBUTEROL 0.5-2.5 (3) MG/3ML IN SOLN
3.0000 mL | Freq: Four times a day (QID) | RESPIRATORY_TRACT | Status: DC
  Filled 2024-02-06: qty 3

## 2024-02-06 MED ORDER — APIXABAN 5 MG PO TABS
5.0000 mg | ORAL_TABLET | Freq: Two times a day (BID) | ORAL | Status: DC
Start: 2024-02-06 — End: 2024-02-09
  Administered 2024-02-06 – 2024-02-09 (×6): 5 mg via ORAL
  Filled 2024-02-06 (×6): qty 1

## 2024-02-06 MED ORDER — FENTANYL CITRATE PF 50 MCG/ML IJ SOSY
50.0000 ug | PREFILLED_SYRINGE | Freq: Once | INTRAMUSCULAR | Status: AC
  Administered 2024-02-06: 50 ug via INTRAVENOUS
  Filled 2024-02-06: qty 1

## 2024-02-06 MED ORDER — TAMSULOSIN HCL 0.4 MG PO CAPS
0.4000 mg | ORAL_CAPSULE | Freq: Every day | ORAL | Status: DC
  Administered 2024-02-06 – 2024-02-09 (×4): 0.4 mg via ORAL
  Filled 2024-02-06 (×4): qty 1

## 2024-02-06 MED ORDER — IOHEXOL 300 MG/ML  SOLN
100.0000 mL | Freq: Once | INTRAMUSCULAR | Status: AC | PRN
  Administered 2024-02-06: 100 mL via INTRAVENOUS

## 2024-02-06 MED ORDER — BUDESON-GLYCOPYRROL-FORMOTEROL 160-9-4.8 MCG/ACT IN AERO: 2.0000 | INHALATION_SPRAY | Freq: Two times a day (BID) | RESPIRATORY_TRACT | Status: DC

## 2024-02-06 MED ORDER — IPRATROPIUM-ALBUTEROL 0.5-2.5 (3) MG/3ML IN SOLN: 3.0000 mL | Freq: Four times a day (QID) | RESPIRATORY_TRACT | Status: DC | PRN

## 2024-02-06 MED ORDER — ACETAMINOPHEN 650 MG RE SUPP: 650.0000 mg | Freq: Four times a day (QID) | RECTAL | Status: DC | PRN

## 2024-02-06 MED ORDER — INSULIN DEGLUDEC 100 UNIT/ML ~~LOC~~ SOPN: 60.0000 [IU] | PEN_INJECTOR | Freq: Every day | SUBCUTANEOUS | Status: DC

## 2024-02-06 MED ORDER — HYDROMORPHONE HCL 1 MG/ML IJ SOLN
1.0000 mg | INTRAMUSCULAR | Status: DC | PRN
  Administered 2024-02-06 – 2024-02-08 (×4): 1 mg via INTRAVENOUS
  Filled 2024-02-06 (×4): qty 1

## 2024-02-06 MED ORDER — LINEZOLID 600 MG/300ML IV SOLN
600.0000 mg | Freq: Two times a day (BID) | INTRAVENOUS | Status: DC
  Administered 2024-02-06 – 2024-02-07 (×2): 600 mg via INTRAVENOUS
  Filled 2024-02-06 (×3): qty 300

## 2024-02-06 MED ORDER — AMIODARONE HCL 200 MG PO TABS
200.0000 mg | ORAL_TABLET | Freq: Every evening | ORAL | Status: DC
  Administered 2024-02-06 – 2024-02-08 (×3): 200 mg via ORAL
  Filled 2024-02-06 (×3): qty 1

## 2024-02-06 NOTE — Sepsis Progress Note (Addendum)
 Elink following for sepsis protocol called at 0310, [facility] done at 00:48, Abx's given 00:53, VSS, Lactic 1.3

## 2024-02-06 NOTE — H&P (Signed)
 History and Physical    Patient: Shawn Sullivan EAV:[id number] DOB: 1946/07/31 DOA: 02/05/2024 DOS: the patient was seen and examined on 02/06/2024 PCP: Blane Ohara, MD  Patient coming from: Home  Chief Complaint:  Chief Complaint  Patient presents with   Groin Swelling   HPI: Shawn Sullivan is a 78 y.o. male with medical history significant of COPD, paroxysmal atrial fibrillation on apixaban, hypertension, hypertensive heart disease, dermatochalasis, generalized edema,  lower back pain, lumbosacral spondylosis, hyperlipidemia, restless leg syndrome, OSA not on CPAP, COPD, type 2 diabetes, class III obesity who presented to the emergency department with complaints of scrotal swelling for the past 3 days after he accidentally scratched himself while cleaning the area associated with fever.  He has been taking his home hydrocodone with some mild relief. He denied fever, chills, rhinorrhea, sore throat or hemoptysis but has occasional wheezing.  No chest pain, palpitations, diaphoresis, PND, orthopnea, but develops occasional pitting edema of the lower extremities.  No abdominal pain, nausea, emesis, diarrhea, constipation, melena or hematochezia.  No flank pain, dysuria, frequency or hematuria.  No polyuria, polydipsia, polyphagia or blurred vision.   Lab work: Urinalysis showed greater than 500 glucose and trace hemoglobin.  There was rare bacteria on microscopic examination.  Lactic acid was normal.  CBC showed a white count of 13.1 with 81% neutrophils, hemoglobin 14.3 g/dL platelets 782.  CMP showed an albumin of 3.3 g/dL, glucose 956, creatinine 1.43 and total bilirubin 1.7 mg/dL.  Imaging: Scrotal ultrasound showed a hyperemic right epididymis consistent with epididymitis.  The adjacent right testicle is also heterogenous suspicious for colitis.  There is diffuse scrotal skin thickening and edema, may be reactive or cellulitis.  No discrete subcutaneous fluid collection.  CT recommended if  there is concern for tissue gas.  There was complex right hydrocele he.  CT abdomen/pelvis with contrast with no acute intra-abdominal pathology identified.  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  This is incompletely included in this examination.  Cholelithiasis.  There is mild lobular contour of the liver and relative hypertrophy of the left hepatic lobe may reflect changes of underlying cirrhosis but is not definite.  This could be further assess with tissue sampling or hepatic elastography.  Moderate splenomegaly, possibly reflecting changes of portal venous hypertension.  Moderate hiatal hernia.  Moderate prostatic hypertrophic, aortic atherosclerosis.  ED course: Initial vital signs were temperature 97.7 F, pulse 71, respirations 16, BP 137/80 mmHg O2 sat 95% on room air.  He received fentanyl 50 mcg IVP, LR 1000 mL liter bolus, linezolid 600 mg IVPB and Zosyn 3.375 g IVPB.   Review of Systems: As mentioned in the history of present illness. All other systems reviewed and are negative. Past Medical History:  Diagnosis Date   Anticoagulated 07/01/2018   Anticoagulated 07/01/2018   Benign essential hypertension 05/06/2013   Chronic obstructive pulmonary disease (HCC) 11/05/2015   COPD (chronic obstructive pulmonary disease) (HCC) 07/01/2018   Dermatochalasis 12/16/2015   Disorder of bursae and tendons in shoulder region 01/05/2014   Generalized edema 06/22/2016   Hyperlipidemia 12/16/2015   Hypertensive heart disease 07/01/2018   Low back pain 08/17/2013   Lumbosacral spondylosis 05/06/2013   Obstructive sleep apnea 07/01/2018   Persistent atrial fibrillation (HCC) 07/01/2018   Piriformis syndrome 05/03/2014   Postlaminectomy syndrome, lumbar region 05/06/2013   Presbyopia of both eyes 12/16/2015  Restless legs  syndrome 03/23/2016   Restrictive lung disease 06/22/2016   SI (sacroiliac) pain 08/12/2020   Sleep apnea 05/06/2013   Status post intraocular lens implant 12/16/2015   Type 2 diabetes mellitus without complications (HCC) 11/05/2015   Past Surgical History:  Procedure Laterality Date   APPENDECTOMY     BACK SURGERY     CARDIOVERSION N/A 12/01/2018   Procedure: CARDIOVERSION;  Surgeon: Thurmon Fair, MD;  Location: [facility];  Service: Cardiovascular;  Laterality: N/A;   CATARACT EXTRACTION Bilateral    LUMBAR FUSION     NECK SURGERY     Social History:  reports that he quit smoking about 32 years ago. His smoking use included cigarettes. He has never used smokeless tobacco. He reports that he does not currently use alcohol. He reports that he does not currently use drugs after having used the following drugs: Hydrocodone.  Allergies  Allergen Reactions   Androgel [Testosterone] Other (See Comments)    Pedal edema    Biaxin [Clarithromycin] Other (See Comments)    Taste perception alteration   Duloxetine Other (See Comments)    Hallucinations   Gabapentin Other (See Comments)    hallucinations   Ibuprofen Other (See Comments)    GI upset    Lyrica [Pregabalin] Swelling   Spironolactone Other (See Comments)    Family History  Problem Relation Age of Onset   Atrial fibrillation Mother    Cancer Mother    Heart disease Father     Prior to Admission medications   Medication Sig Start Date End Date Taking? Authorizing Provider  acetaminophen (TYLENOL) 500 MG tablet Take 1,000 mg by mouth every 6 (six) hours as needed for moderate pain or headache.    [provider]  amiodarone (PACERONE) 200 MG tablet TAKE ONE TABLET BY MOUTH EVERY EVENING 07/14/23   Baldo Daub, MD  apixaban (ELIQUIS) 5 MG TABS tablet Take 1 tablet (5 mg total) by mouth 2 (two) times daily. 12/23/23   Baldo Daub, MD  BD PEN NEEDLE MICRO U/F 32G X 6 MM MISC SMARTSIG:1 Needle SUB-Q Daily  02/14/20   [provider]  Budeson-Glycopyrrol-Formoterol (BREZTRI AEROSPHERE) 160-9-4.8 MCG/ACT AERO Inhale 2 puffs into the lungs 2 (two) times daily. 09/15/23   CoxFritzi Mandes, MD  cholecalciferol (VITAMIN D3) 25 MCG (1000 UT) tablet Take 1,000 Units by mouth daily.     [provider]  dapagliflozin propanediol (FARXIGA) 10 MG TABS tablet Take 1 tablet (10 mg total) by mouth daily before breakfast. 09/01/22   Cox, Kirsten, MD  ELIQUIS 5 MG TABS tablet Take 1 tablet by mouth twice daily 11/15/23   Cox, Kirsten, MD  fluticasone [facility]) 50 MCG/ACT nasal spray Place 2 sprays into both nostrils daily. 09/07/23   CoxFritzi Mandes, MD  folic acid (FOLVITE) 800 MCG tablet Take 800 mcg by mouth daily.    [provider]  HYDROcodone-acetaminophen (NORCO) 10-325 MG tablet Take 1 tablet by mouth every 4 (four) hours as needed for moderate pain. 05/17/17   [provider]  icosapent Ethyl (VASCEPA) 1 g capsule Take 2 capsules (2 g total) by mouth 2 (two) times daily. 09/09/23   Cox, Fritzi Mandes, MD  insulin degludec (TRESIBA FLEXTOUCH) 100 UNIT/ML FlexTouch Pen Inject 60 Units into the skin daily.    [provider]  ipratropium-albuterol (DUONEB) 0.5-2.5 (3) MG/3ML SOLN INHALE THE CONTENTS OF 1 VIAL VIA NEBULIZER EVERY 6 HOURS AS NEEDED FOR SHORTNESS OF BREATH 12/25/23   Blane Ohara, MD  levocetirizine (XYZAL) 5 MG tablet Take 5 mg by mouth daily as needed for allergies.    [provider]  metolazone (ZAROXOLYN) 5 MG tablet Take 5 mg by mouth once a week. Uses prn if swollen once weekly. 04/26/20   [provider]  metoprolol tartrate (LOPRESSOR) 50 MG tablet TAKE 1 TABLET BY MOUTH EVERY MORNING, TAKE 1 TABLET IN THE EVENING AND TAKE 1 TABLET AT BEDTIME 11/25/23   Munley, Iline Oven, MD  montelukast (SINGULAIR) 10 MG tablet TAKE 1 TABLET BY MOUTH EVERY DAY AT BEDTIME 01/24/24   Cox, Fritzi Mandes, MD  Omeprazole 20 MG TBEC Take 20 mg by mouth 2 (two) times daily  before a meal.  06/25/14   [provider]  potassium chloride SA (KLOR-CON M) 20 MEQ tablet TAKE 2 TABLETS BY MOUTH AT BREAKFAST AND AT BEDTIME 10/29/23   Cox, Kirsten, MD  rOPINIRole (REQUIP) 1 MG tablet Take 1 tablet (1 mg total) by mouth at bedtime. 09/07/23   Cox, Fritzi Mandes, MD  rosuvastatin (CRESTOR) 40 MG tablet TAKE 1 TABLET BY MOUTH EVERY DAY AT BEDTIME 10/29/23   Cox, Kirsten, MD  Semaglutide, 1 MG/DOSE, (OZEMPIC, 1 MG/DOSE,) 2 MG/1.5ML SOPN Inject 1 mg into the skin once a week. 03/24/23   Cox, Fritzi Mandes, MD  sertraline (ZOLOFT) 50 MG tablet TAKE 1 TABLET BY MOUTH AT BREAKFAST AND AT BEDTIME 10/29/23   Cox, Fritzi Mandes, MD  tamsulosin (FLOMAX) 0.4 MG CAPS capsule TAKE 1 CAPSULE BY MOUTH EVERY DAY AT BEDTIME 01/24/24   Cox, Kirsten, MD  tiZANidine (ZANAFLEX) 2 MG tablet TAKE 1 TABLET BY MOUTH EVERY DAY AT BEDTIME 10/29/23   Cox, Kirsten, MD  torsemide (DEMADEX) 20 MG tablet TAKE TWO (2) TABLETS BY MOUTH EACH MORNING AND TAKE 2 TABLETS AT NOON 10/29/23   Blane Ohara, MD  VENTOLIN HFA 108 [phone] Base) MCG/ACT inhaler INHALE 2 PUFFS BY MOUTH EVERY 4 HOURS AS NEEDED FOR SHORTNESS OF BREATH AND WHEEZING 08/19/23   Blane Ohara, MD    Physical Exam: Vitals:   02/06/24 0430 02/06/24 0549 02/06/24 0750 02/06/24 0856  BP: 120/73  (!) 129/57 (!) 140/89  Pulse:   (!) 101 (!) 108  Resp:   18 (!) 21  Temp:  99.1 F (37.3 C) 99.8 F (37.7 C) 98.3 F (36.8 C)  TempSrc:  Oral Oral   SpO2:   91% 94%  Weight:      Height:       Physical Exam Vitals and nursing note reviewed.  Constitutional:      General: He is awake. He is not in acute distress.    Appearance: He is morbidly obese. He is ill-appearing.  HENT:     Head: Normocephalic.     Nose: No rhinorrhea.     Mouth/Throat:     Mouth: Mucous membranes are moist.  Eyes:     General: No scleral icterus.    Pupils: Pupils are equal, round, and reactive to light.  Cardiovascular:     Rate and Rhythm: Normal rate and regular rhythm.   Pulmonary:     Breath sounds: Wheezing present. No rhonchi or rales.  Abdominal:     General: Bowel sounds are normal.     Palpations: Abdomen is soft.     Tenderness: There is no abdominal tenderness.  Genitourinary:    Testes:        Right: Tenderness and swelling present.        Left: Tenderness and swelling present.     Comments: Perianal  erythema. Musculoskeletal:     Cervical back: Neck supple.  Skin:    Findings: Erythema present.  Neurological:     General: No focal deficit present.     Mental Status: He is alert and oriented to person, place, and time.  Psychiatric:        Mood and Affect: Mood normal.        Behavior: Behavior normal. Behavior is cooperative.     Data Reviewed:  Results are pending, will review when available. 12/04/2022 echocardiogram report. IMPRESSIONS:   1. Left ventricular ejection fraction, by estimation, is 55 to 60%. The  left ventricle has normal function. Left ventricular endocardial border  not optimally defined to evaluate regional wall motion. There is moderate  concentric left ventricular  hypertrophy. Left ventricular diastolic parameters were normal.   2. Right ventricular systolic function is normal. The right ventricular  size is normal.   3. Left atrial size was mild to moderately dilated.   4. The mitral valve is normal in structure. Mild mitral valve  regurgitation. No evidence of mitral stenosis.   5. The aortic valve is tricuspid. Aortic valve regurgitation is not  visualized. Mild aortic valve stenosis.   6. The inferior vena cava is normal in size with greater than 50%  respiratory variability, suggesting right atrial pressure of 3 mmHg.   Assessment and Plan: Principal Problem:   Cellulitis of scrotum With imaging suspicious for right epididymitis/orchitis. Inpatient/MedSurg. Hold IV fluids. Resume diuretic in p.m. May have diet. Analgesics as needed. Antiemetics as needed. Continue linezolid 600 mg p.o.  twice daily. Continue Zosyn 3.375 g IVPB every 8 hours. Follow CBC, CMP and lipase in AM. Consider urology consult if no improvement.  Active Problems:   PAF (paroxysmal atrial fibrillation) (HCC) CHA?DS?-VASc Score of at least 6. (All risk factors, but Hx of CVA) Continue amiodarone 200 mg p.o. daily. Continue metoprolol 50 mg p.o. twice daily. Continue apixaban 5 mg p.o. twice daily.    CKD stage 3b, GFR 30-44 ml/min (HCC) Monitor renal function electrolytes.    COPD (chronic obstructive pulmonary disease) (HCC)   Restrictive lung disease Continue Breztri Aerosphere twice daily. DuoNeb every 6 hours as needed. Supplemental oxygen as needed.    Obstructive sleep apnea Resume CPAP while in the hospital. These should improve healing.    Restless legs syndrome Continue ropinirole 1 mg p.o. bedtime as needed.    Hyperlipidemia Aortic atherosclerosis (HCC) Continue rosuvastatin 40 mg p.o. daily.    Depressive disorder Continue sertraline 50 mg p.o. twice daily.    Class 3 severe obesity due to excess calories with serious  comorbidity and body mass index (BMI) of 40.0 to 44.9 in adult [facility]) Current BMI 41.43 kg/m. Would benefit from lifestyle modifications. Follow-up closely with PCP or bariatric clinic.    Diabetic polyneuropathy (HCC) Analgesics as needed. Blood glucose control.    Gastroesophageal reflux disease without esophagitis Continue daily omeprazole or formulary equivalent.    Chronic combined systolic and diastolic congestive heart failure (HCC) Last echo showed recovered EF and resolved diastolic dysfunction. However, has frequent lower extremity edema.    Mild protein malnutrition (HCC) In the setting of anemia and malignancy. May benefit from protein supplementation. Consider nutritional services evaluation. Follow-up albumin level.    Hyperbilirubinemia Associated with:   Splenomegaly Imaging also showing signs of cirrhosis. Case suspicious  for NASH. Monitor bilirubin level. If worsening, will hold statin.    Thrombocytopenia (HCC) Splenomegaly? Reactive to anticoagulation. Follow-up platelet count.    Retroperitoneal  mass  Questionable retroperitoneal liposarcoma. Will discuss with IR and oncology Monday morning.    Advance Care Planning:   Code Status: Full Code   Consults:   Family Communication: His spouse Steward Drone was at bedside.  Severity of Illness: The appropriate patient status for this patient is INPATIENT. Inpatient status is judged to be reasonable and necessary in order to provide the required intensity of service to ensure the patient's safety. The patient's presenting symptoms, physical exam findings, and initial radiographic and laboratory data in the context of their chronic comorbidities is felt to place them at high risk for further clinical deterioration. Furthermore, it is not anticipated that the patient will be medically stable for discharge from the hospital within 2 midnights of admission.   * I certify that at the point of admission it is my clinical judgment that the patient will require inpatient hospital care spanning beyond 2 midnights from the point of admission due to high intensity of service, high risk for further deterioration and high frequency of surveillance required.*  Author: Bobette Mo, MD 02/06/2024 9:15 AM  For on call review [email or web address].   This document was prepared using Dragon voice recognition software and may contain some unintended transcription errors.

## 2024-02-06 NOTE — Progress Notes (Signed)
 Pharmacy Antibiotic Note  Shawn Sullivan is a 78 y.o. male admitted on 02/05/2024 with  scrotal cellulitis .  Pharmacy has been consulted for Zosyn dosing. WBC is mildly elevated. Mild bump in Scr.   Plan: Zosyn 3.375G IV q8h to be infused over 4 hours Zyvox per MD Trend WBC, temp, renal function  F/U infectious work-up   Height: 6\' 1"  (185.4 cm) Weight: (!) 142.4 kg (314 lb) IBW/kg (Calculated) : 79.9  Temp (24hrs), Avg:97.8 F (36.6 C), Min:97.7 F (36.5 C), Max:97.9 F (36.6 C)  Recent Labs  Lab 02/05/24 0048 02/06/24 0048  WBC  --  13.1*  CREATININE  --  1.43*  LATICACIDVEN 1.3  --     Estimated Creatinine Clearance: 62.1 mL/min (A) (by C-G formula based on SCr of 1.43 mg/dL (H)).    Allergies  Allergen Reactions   Androgel [Testosterone] Other (See Comments)    Pedal edema    Biaxin [Clarithromycin] Other (See Comments)    Taste perception alteration   Duloxetine Other (See Comments)    Hallucinations   Gabapentin Other (See Comments)    hallucinations   Ibuprofen Other (See Comments)    GI upset    Lyrica [Pregabalin] Swelling   Spironolactone Other (See Comments)    Abran Duke, PharmD, BCPS Clinical Pharmacist Phone: [phone]

## 2024-02-06 NOTE — Plan of Care (Signed)
   Problem: Coping: Goal: Ability to adjust to condition or change in health will improve Outcome: Progressing   Problem: Fluid Volume: Goal: Ability to maintain a balanced intake and output will improve Outcome: Progressing

## 2024-02-06 NOTE — Progress Notes (Signed)
   02/06/24 2100  BiPAP/CPAP/SIPAP  Reason BIPAP/CPAP not in use Non-compliant (Pt and family refused cpap. last worn years ago. RN aware)

## 2024-02-06 NOTE — ED Notes (Signed)
 Attempted to call report, no response. [facility] otw to unit.

## 2024-02-06 NOTE — ED Notes (Signed)
 Extravasation noted to LFA PIV with LR infusing. Fluids stopped, IV removed, and fluids restarted on hand PIV. Pt has no complaints of pain at this time.

## 2024-02-07 DIAGNOSIS — N492 Inflammatory disorders of scrotum: Secondary | ICD-10-CM | POA: Diagnosis not present

## 2024-02-07 LAB — URINE CULTURE

## 2024-02-07 LAB — CBC
HCT: 46.2 % (ref 39.0–52.0)
Hemoglobin: 14.3 g/dL (ref 13.0–17.0)
MCH: 29.5 pg (ref 26.0–34.0)
MCHC: 31 g/dL (ref 30.0–36.0)
MCV: 95.3 fL (ref 80.0–100.0)
Platelets: 121 10*3/uL — ABNORMAL LOW (ref 150–400)
RBC: 4.85 MIL/uL (ref 4.22–5.81)
RDW: 15.9 % — ABNORMAL HIGH (ref 11.5–15.5)
WBC: 8.8 10*3/uL (ref 4.0–10.5)
nRBC: 0 % (ref 0.0–0.2)

## 2024-02-07 LAB — GC/CHLAMYDIA PROBE AMP (~~LOC~~) NOT AT ARMC
Chlamydia: NEGATIVE
Comment: NEGATIVE
Comment: NORMAL
Neisseria Gonorrhea: NEGATIVE

## 2024-02-07 LAB — LACTATE DEHYDROGENASE: LDH: 137 U/L (ref 98–192)

## 2024-02-07 LAB — GLUCOSE, CAPILLARY
Glucose-Capillary: 102 mg/dL — ABNORMAL HIGH (ref 70–99)
Glucose-Capillary: 147 mg/dL — ABNORMAL HIGH (ref 70–99)
Glucose-Capillary: 185 mg/dL — ABNORMAL HIGH (ref 70–99)
Glucose-Capillary: 184 mg/dL — ABNORMAL HIGH (ref 70–99)

## 2024-02-07 LAB — COMPREHENSIVE METABOLIC PANEL
ALT: 17 U/L (ref 0–44)
AST: 22 U/L (ref 15–41)
Albumin: 2.8 g/dL — ABNORMAL LOW (ref 3.5–5.0)
Alkaline Phosphatase: 81 U/L (ref 38–126)
Anion gap: 9 (ref 5–15)
BUN: 16 mg/dL (ref 8–23)
CO2: 23 mmol/L (ref 22–32)
Calcium: 8.9 mg/dL (ref 8.9–10.3)
Chloride: 106 mmol/L (ref 98–111)
Creatinine, Ser: 1.21 mg/dL (ref 0.61–1.24)
GFR, Estimated: >60 mL/min (ref >60)
Glucose, Bld: 125 mg/dL — ABNORMAL HIGH (ref 70–99)
Potassium: 3.6 mmol/L (ref 3.5–5.1)
Sodium: 138 mmol/L (ref 135–145)
Total Bilirubin: 1.7 mg/dL — ABNORMAL HIGH (ref 0.0–1.2)
Total Protein: 6.3 g/dL — ABNORMAL LOW (ref 6.5–8.1)

## 2024-02-07 MED ORDER — LINEZOLID 600 MG PO TABS
600.0000 mg | ORAL_TABLET | Freq: Two times a day (BID) | ORAL | Status: DC
  Administered 2024-02-07 – 2024-02-09 (×5): 600 mg via ORAL
  Filled 2024-02-07 (×5): qty 1

## 2024-02-07 MED ORDER — LINEZOLID 600 MG PO TABS: 600.0000 mg | ORAL_TABLET | Freq: Two times a day (BID) | ORAL | Status: DC

## 2024-02-07 NOTE — Plan of Care (Signed)
  Problem: Pain Managment: Goal: General experience of comfort will improve and/or be controlled Outcome: Progressing   Problem: Skin Integrity: Goal: Risk for impaired skin integrity will decrease Outcome: Progressing   Problem: Elimination: Goal: Will not experience complications related to bowel motility Outcome: Progressing   Problem: Elimination: Goal: Will not experience complications related to urinary retention Outcome: Progressing   Problem: Nutrition: Goal: Adequate nutrition will be maintained Outcome: Progressing

## 2024-02-07 NOTE — Progress Notes (Signed)
 PROGRESS NOTE    LEDGER HEINDL  ZOX:[id number] DOB: 06/18/1946 DOA: 02/05/2024 PCP: Blane Ohara, MD    Brief Narrative:   WINTER TREFZ is a 78 y.o. male with past medical history significant for paroxysmal atrial fibrillation on apixaban, HTN, HLD, RLS, COPD, DM2, OSA not on CPAP, chronic low back pain, CKD stage IIIb, history of combined systolic/diastolic congestive heart failure who presented to [facility] ED on 02/05/2024 from home with progressive scrotal swelling and pain over the last 2-3 days.  Patient reports scratch to area while cleaning himself.  Denies any discharge.  Reports has been taking his home hydrocodone with some mild relief.  Denies fever, no chills, no nausea/vomiting/diarrhea, no dysuria.  In the ED, temperature 97.9 F, HR 100, RR 15, BP 174/158, SpO2 93% on room air.  WBC 13.1, hemoglobin 14.3, platelet count 120.  Sodium 138, potassium 3.5, chloride 102, CO2 26, glucose 155, BUN 20, creat 1.43.  AST 27, ALT 19, total bilirubin 1.7.  Urinalysis unrevealing.  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  Ultrasound scrotum with a large heterogeneous hyperemic right epididymis consistent with epididymitis, right testicle also heterogeneous suspicious for orchitis, diffuse scrotal skin thickening and edema may be reactive versus cellulitis, no discrete subcutaneous fluid collection, complex right hydrocele.  He received fentanyl 50 mcg IVP, LR 1000 mL liter bolus, linezolid 600 mg IVPB and Zosyn 3.375 g IVPB. TRH consulted for  admission for further evaluation management of scrotal cellulitis, orchitis, epididymitis.    Assessment & Plan:   Scrotal cellulitis Orchitis Epididymitis Patient presenting to ED with progressive swelling, pain to his scrotal region following a "scratch" while cleaning himself.  Patient was afebrile with elevated WBC count of 13.1 on admission.  Scrotal ultrasound with  large heterogeneous hyperemic right epididymis consistent with epididymitis, right testicle also heterogeneous suspicious for orchitis, diffuse scrotal skin thickening and edema may be reactive versus cellulitis, no discrete subcutaneous fluid collection, complex right hydrocele.  Urine culture with multiple species.  GC/chlamydia PCR probe negative. -- WBC 13.1>>8.8 -- Blood cultures x 2: Pending -- Linezolid 600 mg PO every 12 hours -- Zosyn 3.375 g IV every 8 hours --Oxycodone 5 mg p.o. every 4 hours.  Moderate pain --Dilaudid 1 mg IV every 4 hours as needed severe pain -- Supportive care, scrotal elevation  Retroperitoneal mass concerning for liposarcoma CT abdomen/pelvis notable for 17.1 x 19.7 x 19.3 cm infiltrative soft tissue mass retroperitoneum.  LDH 137.  Discussed with patient obtaining biopsy, will decline at this time and anticipate outpatient follow-up with oncology.  Paroxysmal atrial fibrillation -- Amiodarone 200 mg p.o. daily -- Metoprolol tartrate 50 mg p.o. twice daily -- Eliquis 5 mg p.o. twice daily  Chronic combined systolic/diastolic congestive heart failure, compensated Essential hypertension -- Metoprolol tartrate 50 g p.o. twice daily -- Torsemide 40 mg p.o. twice daily -- Metolazone 5 g p.o. weekly  Hyperlipidemia -- Crestor 40 mg p.o. daily  DM2 -- Moderate SSI for coverage -- CBG before every meal/at bedtime  CKD stage IIIb -- Cr 1.21, stable -- BMP daily  Anxiety/depression -- Sertraline 50 mg p.o. twice daily  BPH -- Tamsulosin 0.4 mg p.o. daily  COPD -- Breo Ellipta  1 puff daily -- Incruse Ellipta 1 puff daily -Albuterol neb every 4 hours as needed wheezing/shortness of breath  OSA Does not utilize CPAP at baseline.  Obesity, class III Body mass index is 41.43 kg/m.    DVT prophylaxis:  apixaban (ELIQUIS) tablet 5 mg    Code Status: Full Code Family Communication: Updated multiple family was present at bedside this morning  Disposition Plan:  Level of care: Med-Surg Status is: Inpatient Remains inpatient appropriate because: IV antibiotics    Consultants:  None  Procedures:  Scrotal ultrasound  Antimicrobials:  Linezolid 3/8>> Zosyn 3/8>>   Subjective: Patient seen examined bedside, lying in bed.  Multiple family members present.  Continues with scrotal pain, edema.  Afebrile, WBC count now normalized.  Discussed with patient that edema will persist for likely for several weeks.  Remains on IV antibiotics.  No other specific complaints or concerns at this time.  Denies headache, no dizziness, no chest pain, no palpitations, no shortness of breath, no abdominal pain, no fever/chills/night sweats, no nausea/vomiting/diarrhea, no focal weakness, no fatigue, no paresthesia.  Objective: Vitals:   02/07/24 0908 02/07/24 1224 02/07/24 1554 02/07/24 1735  BP: (!) 154/95  (!) 146/79 131/88  Pulse: 93  98 (!) 101  Resp: 18     Temp: 98.4 F (36.9 C)  98.7 F (37.1 C)   TempSrc: Oral     SpO2: 95% 92% 90%   Weight:      Height:        Intake/Output Summary (Last 24 hours) at 02/07/2024 1805 Last data filed at 02/07/2024 1333 Gross per 24 hour  Intake --  Output 4150 ml  Net -4150 ml   Filed Weights   02/05/24 2015  Weight: (!) 142.4 kg    Examination:  Physical Exam: GEN: NAD, alert and oriented x 3, obese HEENT: NCAT, PERRL, EOMI, sclera clear, MMM PULM: CTAB w/o wheezes/crackles, normal respiratory effort, on room air CV: RRR w/o M/G/R GI: abd soft, NTND, NABS, no R/G/M GU: Scrotal erythema with edema, TTP greatest on  right side with thickened skin MSK: no peripheral edema, muscle strength globally intact 5/5 bilateral upper/lower extremities NEURO: No focal neurological deficit PSYCH: normal mood/affect Integumentary: Scrotum as above, otherwise no other concerning rashes/lesions/wounds noted on exposed skin surfaces.     Data Reviewed: I have personally reviewed following labs and imaging studies  CBC: Recent Labs  Lab 02/06/24 0048 02/07/24 0412  WBC 13.1* 8.8  NEUTROABS 10.5*  --   HGB 14.3 14.3  HCT 44.7 46.2  MCV 91.6 95.3  PLT 120* 121*   Basic Metabolic Panel: Recent Labs  Lab 02/06/24 0048 02/07/24 0412  NA 138 138  K 3.5 3.6  CL 102 106  CO2 26 23  GLUCOSE 155* 125*  BUN 20 16  CREATININE 1.43* 1.21  CALCIUM 9.1 8.9   GFR: Estimated Creatinine Clearance: 73.4 mL/min (by C-G formula based on SCr of 1.21 mg/dL). Liver Function Tests: Recent Labs  Lab 02/06/24 0048 02/07/24 0412  AST 27 22  ALT 19 17  ALKPHOS 87 81  BILITOT 1.7* 1.7*  PROT 7.0 6.3*  ALBUMIN 3.3* 2.8*   No results for input(s): "LIPASE", "AMYLASE" in the last 168 hours. No results for input(s): "AMMONIA" in the last 168 hours. Coagulation Profile: No results for input(s): "INR", "PROTIME" in the last 168 hours. Cardiac Enzymes: No results for input(s): "CKTOTAL", "CKMB", "CKMBINDEX", "TROPONINI" in the last 168 hours. BNP (last 3 results) No  results for input(s): "PROBNP" in the last 8760 hours. HbA1C: No results for input(s): "HGBA1C" in the last 72 hours. CBG: Recent Labs  Lab 02/06/24 1656 02/06/24 2103 02/07/24 0813 02/07/24 1227 02/07/24 1717  GLUCAP 148* 173* 102* 147* 185*   Lipid Profile: No results for input(s): "CHOL", "HDL", "LDLCALC", "TRIG", "CHOLHDL", "LDLDIRECT" in the last 72 hours. Thyroid Function Tests: No results for input(s): "TSH", "T4TOTAL", "FREET4", "T3FREE", "THYROIDAB" in the last 72 hours. Anemia Panel: No results for input(s): "VITAMINB12", "FOLATE",  "FERRITIN", "TIBC", "IRON", "RETICCTPCT" in the last 72 hours. Sepsis Labs: Recent Labs  Lab 02/05/24 0048  LATICACIDVEN 1.3    Recent Results (from the past 240 hours)  Urine Culture     Status: Abnormal   Collection Time: 02/05/24  9:01 PM   Specimen: Urine, Clean Catch  Result Value Ref Range Status   Specimen Description   Final    URINE, CLEAN CATCH Performed at [facility], [address] [place], [place] [address]    Special Requests   Final    NONE Performed at [facility], [address] [place], [place] [address]    Culture MULTIPLE SPECIES PRESENT, SUGGEST RECOLLECTION (A)  Final   Report Status 02/07/2024 FINAL  Final  Blood culture (routine x 2)     Status: None (Preliminary result)   Collection Time: 02/06/24 12:48 AM   Specimen: BLOOD LEFT HAND  Result Value Ref Range Status   Specimen Description   Final    BLOOD LEFT HAND Performed at [facility], [address] [place], [place] [address]    Special Requests   Final    BOTTLES DRAWN AEROBIC AND ANAEROBIC Blood Culture adequate volume Performed at [facility], [address] [place], [place] [address]    Culture   Final    NO GROWTH < 24 HOURS Performed at [facility], [address], [place], [place] [address]    Report Status PENDING  Incomplete  Blood culture (routine x 2)     Status: None (Preliminary result)   Collection Time: 02/06/24 12:48 AM   Specimen: BLOOD LEFT FOREARM  Result Value Ref Range Status   Specimen Description   Final    BLOOD LEFT FOREARM Performed at [facility], [address] [place], [place] [address]    Special Requests   Final    BOTTLES DRAWN AEROBIC ONLY Blood Culture results may not be optimal due to an inadequate volume of blood received in culture bottles Performed at [facility], [address] [place], [place] [address]    Culture   Final    NO GROWTH < 24 HOURS Performed at  [facility], [address], [place], [place] [address]    Report Status PENDING  Incomplete         Radiology Studies: CT ABDOMEN PELVIS W CONTRAST Result Date: 02/06/2024 CLINICAL DATA:  Sepsis, necrotizing fasciitis EXAM: CT ABDOMEN AND PELVIS WITH CONTRAST TECHNIQUE: Multidetector CT imaging of the abdomen and pelvis was performed using the standard protocol following bolus administration of intravenous contrast. RADIATION DOSE REDUCTION: This exam was performed according to the departmental dose-optimization program which includes automated exposure control, adjustment of the mA and/or kV according to patient size and/or use of iterative reconstruction technique. CONTRAST:  OMNIPAQUE IOHEXOL 300 MG/ML  SOLN COMPARISON:  None Available. FINDINGS: Lower chest: No acute abnormality. Consolidative changes  within the visualized posterior basal right lower lobe with traction bronchiolectasis likely representing post inflammatory scarring. Moderate hiatal hernia with herniation of retroperitoneal fat into the lower thorax. Hepatobiliary: Cholelithiasis without superimposed pericholecystic inflammatory change. Mild lobular contour of the liver and relative hypertrophy of the left hepatic lobe may reflect changes of underlying cirrhosis, but is nonspecific. Tiny cyst noted within the left hepatic lobe. No definite enhancing intrahepatic mass identified. No intra or extrahepatic biliary ductal dilation. Pancreas: Unremarkable Spleen: Moderate splenomegaly is present the spleen measuring 17.9 cm in greatest dimension. No intrasplenic lesion identified. Adrenals/Urinary Tract: The adrenal glands are unremarkable. The right kidney is displaced superiorly appears non rotated into the anterior-posterior dimension a lipomatous mass within the retroperitoneum demonstrating infiltrative soft tissue components, though this represents a minority the overall mass which is largely consistent of macroscopic  fat. This is most in keeping with a retroperitoneal liposarcoma and measures roughly 17.1 x 19.7 x 19.3 cm in greatest dimension. This is incompletely included on this examination. This appears to abut Gerota's fascia but direct invasion Gerota's space is not clearly identified. Simple cortical cyst noted within the left kidney for which no follow-up imaging is recommended. The kidneys are otherwise unremarkable. Bladder unremarkable. Stomach/Bowel: Bowel is incompletely included within the field of view. The descending colon is partially excluded from view. Visualized stomach, small, and large bowel are unremarkable. The appendix is absent. No free intraperitoneal fluid or gas. Vascular/Lymphatic: Aortic atherosclerosis. No enlarged abdominal or pelvic lymph nodes. Reproductive: Moderate prostatic hypertrophy Other: None significant Musculoskeletal: Postsurgical changes are seen within lumbar spine L4-S1 lumbar fusion and L4-5 posterior decompression. Advanced degenerative changes are seen within the lumbar spine. No acute bone abnormality. IMPRESSION: 1. No acute intra-abdominal pathology identified. No definite radiographic explanation for the patient's reported symptoms. 2. 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 This is incompletely included on this examination. 3. Cholelithiasis. 4. Mild lobular contour of the liver and relative hypertrophy of the left hepatic lobe may reflect changes of underlying cirrhosis, but is not definitive. This could be further assessed with tissue sampling or hepatic elastography 5. Moderate splenomegaly, possibly reflecting changes of portal venous hypertension. 6. Moderate hiatal hernia. 7. Moderate prostatic hypertrophy. Aortic Atherosclerosis (ICD10-I70.0). Electronically Signed   By: Helyn Numbers M.D.    On: 02/06/2024 04:12   US Scrotum Result Date: 02/05/2024 CLINICAL DATA:  scrotal pain and swelling EXAM: ULTRASOUND OF SCROTUM TECHNIQUE: Complete ultrasound examination of the testicles, epididymis, and other scrotal structures was performed. COMPARISON:  None Available. FINDINGS: Right testicle Measurements: 3.9 x 3 x 3.5 cm. Heterogeneous echogenicity. An area of diminished echogenicity peripherally but no discrete mass. Blood flow is demonstrated. Left testicle Measurements: 4.1 x 2.1 x 3.4 cm. Homogeneous echogenicity. No mass or microlithiasis visualized. Blood flow is demonstrated. Right epididymis:  Enlarged, heterogeneous and hyperemic. Left epididymis:  Not well delineated. Hydrocele:  Large complex on the right with internal septations. Varicocele:  None visualized. Diffuse scrotal skin thickening and hyperemia. The degree of scrotal edema limits assessment. IMPRESSION: 1. Enlarged heterogeneous hyperemic right epididymis consistent with epididymitis. The adjacent right testicle is also heterogeneous, suspicious for orchitis. 2. Diffuse scrotal skin thickening and edema, may be reactive or cellulitis. No discrete subcutaneous fluid collection. If there is concern for soft tissue gas, recommend CT. 3. Complex right hydrocele. Electronically Signed   By: Narda Rutherford  M.D.   On: 02/05/2024 21:12        Scheduled Meds:  amiodarone  200 mg Oral QPM   apixaban  5 mg Oral BID   fluticasone furoate-vilanterol  1 puff Inhalation Daily   And   umeclidinium bromide  1 puff Inhalation Daily   insulin aspart  0-15 Units Subcutaneous TID WC   insulin aspart  0-5 Units Subcutaneous QHS   insulin glargine  60 Units Subcutaneous Daily   linezolid  600 mg Oral Q12H   metolazone  5 mg Oral Weekly   metoprolol tartrate  50 mg Oral BID   montelukast  10 mg Oral QHS   pantoprazole  40 mg Oral Daily   potassium chloride SA  40 mEq Oral Daily   rOPINIRole  1 mg Oral QHS   rosuvastatin  40 mg Oral  QHS   sertraline  50 mg Oral BID   tamsulosin  0.4 mg Oral Daily   tiZANidine  2 mg Oral QHS   torsemide  40 mg Oral BID   Continuous Infusions:  piperacillin-tazobactam (ZOSYN)  IV 3.375 g (02/07/24 1331)     LOS: 1 day    Time spent: 52 minutes spent on 02/07/2024 caring for this patient face-to-face including chart review, ordering labs/tests, documenting, discussion with nursing staff, consultants, updating family and interview/physical exam    Alvira Philips [place], DO [facility] Available via Epic secure chat 7am-7pm After these hours, please refer to coverage provider listed on [email or web address] 02/07/2024, 6:05 PM

## 2024-02-07 NOTE — Progress Notes (Signed)
 RT offered pt CPAP for the night. Pt declined at this time stating he has not worn one in years.    02/07/24 2133  BiPAP/CPAP/SIPAP  Reason BIPAP/CPAP not in use Non-compliant (pt declined at this time)

## 2024-02-08 DIAGNOSIS — N492 Inflammatory disorders of scrotum: Secondary | ICD-10-CM | POA: Diagnosis not present

## 2024-02-08 LAB — CBC
HCT: 46.4 % (ref 39.0–52.0)
Hemoglobin: 14.2 g/dL (ref 13.0–17.0)
MCH: 28.7 pg (ref 26.0–34.0)
MCHC: 30.6 g/dL (ref 30.0–36.0)
MCV: 93.7 fL (ref 80.0–100.0)
Platelets: 158 10*3/uL (ref 150–400)
RBC: 4.95 MIL/uL (ref 4.22–5.81)
RDW: 15.8 % — ABNORMAL HIGH (ref 11.5–15.5)
WBC: 8.4 10*3/uL (ref 4.0–10.5)
nRBC: 0 % (ref 0.0–0.2)

## 2024-02-08 LAB — GLUCOSE, CAPILLARY
Glucose-Capillary: 113 mg/dL — ABNORMAL HIGH (ref 70–99)
Glucose-Capillary: 133 mg/dL — ABNORMAL HIGH (ref 70–99)
Glucose-Capillary: 145 mg/dL — ABNORMAL HIGH (ref 70–99)
Glucose-Capillary: 146 mg/dL — ABNORMAL HIGH (ref 70–99)

## 2024-02-08 LAB — COMPREHENSIVE METABOLIC PANEL
ALT: 18 U/L (ref 0–44)
AST: 21 U/L (ref 15–41)
Albumin: 2.7 g/dL — ABNORMAL LOW (ref 3.5–5.0)
Alkaline Phosphatase: 89 U/L (ref 38–126)
Anion gap: 8 (ref 5–15)
BUN: 18 mg/dL (ref 8–23)
CO2: 23 mmol/L (ref 22–32)
Calcium: 8.8 mg/dL — ABNORMAL LOW (ref 8.9–10.3)
Chloride: 105 mmol/L (ref 98–111)
Creatinine, Ser: 1.3 mg/dL — ABNORMAL HIGH (ref 0.61–1.24)
GFR, Estimated: 56 mL/min — ABNORMAL LOW (ref >60)
Glucose, Bld: 157 mg/dL — ABNORMAL HIGH (ref 70–99)
Potassium: 3.4 mmol/L — ABNORMAL LOW (ref 3.5–5.1)
Sodium: 136 mmol/L (ref 135–145)
Total Bilirubin: 1.2 mg/dL (ref 0.0–1.2)
Total Protein: 6.2 g/dL — ABNORMAL LOW (ref 6.5–8.1)

## 2024-02-08 LAB — MAGNESIUM: Magnesium: 2.2 mg/dL (ref 1.7–2.4)

## 2024-02-08 MED ORDER — POTASSIUM CHLORIDE CRYS ER 20 MEQ PO TBCR
30.0000 meq | EXTENDED_RELEASE_TABLET | ORAL | Status: AC
  Administered 2024-02-08 (×2): 30 meq via ORAL
  Filled 2024-02-08 (×2): qty 1

## 2024-02-08 MED ORDER — LOPERAMIDE HCL 2 MG PO CAPS
2.0000 mg | ORAL_CAPSULE | ORAL | Status: DC | PRN
  Administered 2024-02-08 (×2): 2 mg via ORAL
  Filled 2024-02-08 (×2): qty 1

## 2024-02-08 NOTE — Plan of Care (Signed)

## 2024-02-08 NOTE — Plan of Care (Signed)
  Problem: Coping: Goal: Ability to adjust to condition or change in health will improve Outcome: Progressing   Problem: Fluid Volume: Goal: Ability to maintain a balanced intake and output will improve Outcome: Progressing   Problem: Health Behavior/Discharge Planning: Goal: Ability to identify and utilize available resources and services will improve Outcome: Progressing Goal: Ability to manage health-related needs will improve Outcome: Progressing

## 2024-02-08 NOTE — Progress Notes (Signed)
   02/08/24 1306  TOC Brief Assessment  Insurance and Status Reviewed  Patient has primary care physician Yes  Home environment has been reviewed home w/ spouse  Prior level of function: mod independent  Prior/Current Home Services No current home services  Social Drivers of Health Review SDOH reviewed no interventions necessary  Readmission risk has been reviewed Yes  Transition of care needs transition of care needs identified, TOC will continue to follow

## 2024-02-08 NOTE — Progress Notes (Addendum)
 PROGRESS NOTE    Shawn Sullivan  ZOX:[id number] DOB: September 01, 1946 DOA: 02/05/2024 PCP: Blane Ohara, MD    Brief Narrative:   Shawn Sullivan is a 78 y.o. male with past medical history significant for paroxysmal atrial fibrillation on apixaban, HTN, HLD, RLS, COPD, DM2, OSA not on CPAP, chronic low back pain, CKD stage IIIb, history of combined systolic/diastolic congestive heart failure who presented to [facility] ED on 02/05/2024 from home with progressive scrotal swelling and pain over the last 2-3 days.  Patient reports scratch to area while cleaning himself.  Denies any discharge.  Reports has been taking his home hydrocodone with some mild relief.  Denies fever, no chills, no nausea/vomiting/diarrhea, no dysuria.  In the ED, temperature 97.9 F, HR 100, RR 15, BP 174/158, SpO2 93% on room air.  WBC 13.1, hemoglobin 14.3, platelet count 120.  Sodium 138, potassium 3.5, chloride 102, CO2 26, glucose 155, BUN 20, creat 1.43.  AST 27, ALT 19, total bilirubin 1.7.  Urinalysis unrevealing.  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  Ultrasound scrotum with a large heterogeneous hyperemic right epididymis consistent with epididymitis, right testicle also heterogeneous suspicious for orchitis, diffuse scrotal skin thickening and edema may be reactive versus cellulitis, no discrete subcutaneous fluid collection, complex right hydrocele.  He received fentanyl 50 mcg IVP, LR 1000 mL liter bolus, linezolid 600 mg IVPB and Zosyn 3.375 g IVPB. TRH consulted for  admission for further evaluation management of scrotal cellulitis, orchitis, epididymitis.    Assessment & Plan:   Scrotal cellulitis Orchitis Epididymitis Patient presenting to ED with progressive swelling, pain to his scrotal region following a "scratch" while cleaning himself.  Patient was afebrile with elevated WBC count of 13.1 on admission.  Scrotal ultrasound with  large heterogeneous hyperemic right epididymis consistent with epididymitis, right testicle also heterogeneous suspicious for orchitis, diffuse scrotal skin thickening and edema may be reactive versus cellulitis, no discrete subcutaneous fluid collection, complex right hydrocele.  Urine culture with multiple species.  GC/chlamydia PCR probe negative. -- WBC 13.1>>8.8>8.4 -- Blood cultures x 2: Pending -- Linezolid 600 mg PO every 12 hours -- Zosyn 3.375 g IV every 8 hours -- Oxycodone 5 mg p.o. 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  LDH 137.  Discussed with patient obtaining biopsy, will decline at this time and anticipate outpatient follow-up with oncology.  Hypokalemia Potassium 3.4, will replete.  Paroxysmal atrial fibrillation -- Amiodarone 200 mg p.o. daily -- Metoprolol tartrate 50 mg p.o. twice daily -- Eliquis 5 mg p.o. twice daily  Chronic combined systolic/diastolic congestive heart failure, compensated Essential hypertension -- Metoprolol tartrate 50 g p.o. twice daily -- Torsemide 40 mg p.o. twice daily -- Metolazone 5 g p.o. weekly  Hyperlipidemia -- Crestor 40 mg p.o. daily  DM2 -- Moderate SSI for coverage -- CBG before every meal/at bedtime  CKD stage IIIb -- Cr 1.21>1.30, stable -- BMP daily  Anxiety/depression -- Sertraline 50 mg p.o. twice daily  BPH --  Tamsulosin 0.4 mg p.o. daily  COPD -- Breo Ellipta 1 puff daily -- Incruse Ellipta 1 puff daily -Albuterol neb every 4 hours as needed wheezing/shortness of breath  OSA Does not utilize CPAP at baseline.  Obesity, class III Body mass index is 41.43 kg/m.   Weakness/debility/deconditioning: -- PT/OT evaluation   DVT prophylaxis:  apixaban (ELIQUIS) tablet 5 mg    Code Status: Full Code Family Communication: Updated spouse present at bedside this morning  Disposition Plan:  Level of care: Med-Surg Status is: Inpatient Remains inpatient appropriate because: IV antibiotics, anticipate discharge home likely tomorrow    Consultants:  None  Procedures:  Scrotal ultrasound  Antimicrobials:  Linezolid 3/8>> Zosyn 3/8>>   Subjective: Patient seen examined bedside, lying in bed.  Spouse present at bedside.  Continues with scrotal pain/edema, white blood cell count remains within normal limits.   Discussed with patient that edema will persist for likely for several weeks.  Remains on IV antibiotics.  No other specific complaints or concerns at this time.  Denies headache, no dizziness, no chest pain, no palpitations, no shortness of breath, no abdominal pain, no fever/chills/night sweats, no nausea/vomiting/diarrhea, no focal weakness, no fatigue, no paresthesia.  No acute concerns overnight per nurse staff.  Awaiting PT/OT evaluation, anticipate likely discharge home tomorrow.  Objective: Vitals:   02/08/24 0516 02/08/24 0839 02/08/24 0856 02/08/24 1228  BP: (!) 162/90  136/86 (!) 155/95  Pulse: 73  99 93  Resp: 18   19  Temp: 98 F (36.7 C)   97.8 F (36.6 C)  TempSrc: Oral   Oral  SpO2: 91% 93%  95%  Weight:      Height:        Intake/Output Summary (Last 24 hours) at 02/08/2024 1234 Last data filed at 02/08/2024 0430 Gross per 24 hour  Intake 497.48 ml  Output 2350 ml  Net -1852.52 ml   Filed Weights   02/05/24 2015  Weight: (!) 142.4 kg     Examination:  Physical Exam: GEN: NAD, alert and oriented x 3, obese HEENT: NCAT, PERRL, EOMI, sclera clear, MMM PULM: CTAB w/o wheezes/crackles, normal respiratory effort, on room air CV: RRR w/o M/G/R GI: abd soft, NTND, NABS, no R/G/M GU: Scrotal erythema with edema, TTP greatest on right side with thickened skin MSK: no peripheral edema, muscle strength globally intact 5/5 bilateral upper/lower extremities NEURO: No focal neurological deficit PSYCH: normal mood/affect Integumentary: Scrotum as above, otherwise no other concerning rashes/lesions/wounds noted on exposed skin surfaces.     Data Reviewed: I have personally reviewed following labs and imaging studies  CBC: Recent Labs  Lab 02/06/24 0048 02/07/24 0412 02/08/24 0421  WBC 13.1* 8.8 8.4  NEUTROABS 10.5*  --   --   HGB 14.3 14.3 14.2  HCT 44.7 46.2 46.4  MCV 91.6 95.3 93.7  PLT 120* 121* 158   Basic Metabolic Panel: Recent Labs  Lab 02/06/24 0048 02/07/24 0412 02/08/24 0421  NA 138 138 136  K 3.5 3.6 3.4*  CL 102 106 105  CO2 26 23 23   GLUCOSE 155* 125* 157*  BUN 20 16 18   CREATININE 1.43* 1.21 1.30*  CALCIUM 9.1 8.9 8.8*  MG  --   --  2.2   GFR: Estimated Creatinine Clearance: 68.4 mL/min (A) (by C-G formula based on SCr of 1.3 mg/dL (H)). Liver Function Tests: Recent Labs  Lab 02/06/24 0048 02/07/24 0412 02/08/24 0421  AST 27 22 21   ALT 19 17 18   ALKPHOS 87 81 89  BILITOT 1.7* 1.7* 1.2  PROT 7.0 6.3* 6.2*  ALBUMIN 3.3* 2.8* 2.7*   No results for input(s): "LIPASE", "AMYLASE" in the last 168 hours. No results for input(s): "AMMONIA" in the last 168 hours. Coagulation Profile: No results for input(s): "INR", "PROTIME" in the last 168 hours. Cardiac Enzymes: No results for input(s): "CKTOTAL", "CKMB", "CKMBINDEX", "TROPONINI" in the last 168 hours. BNP (last 3 results) No results for input(s): "PROBNP" in the last 8760 hours. HbA1C: No results for input(s): "HGBA1C" in the last 72  hours. CBG: Recent Labs  Lab 02/07/24 1227 02/07/24 1717 02/07/24 2216 02/08/24 0744 02/08/24 1125  GLUCAP 147* 185* 184* 113* 133*   Lipid Profile: No results for input(s): "CHOL", "HDL", "LDLCALC", "TRIG", "CHOLHDL", "LDLDIRECT" in the last 72 hours. Thyroid Function Tests: No results for input(s): "TSH", "T4TOTAL", "FREET4", "T3FREE", "THYROIDAB" in the last 72 hours. Anemia Panel: No results for input(s): "VITAMINB12", "FOLATE", "FERRITIN", "TIBC", "IRON", "RETICCTPCT" in the last 72 hours. Sepsis Labs: Recent Labs  Lab 02/05/24 0048  LATICACIDVEN 1.3    Recent Results (from the past 240 hours)  Urine Culture     Status: Abnormal   Collection Time: 02/05/24  9:01 PM   Specimen: Urine, Clean Catch  Result Value Ref Range Status   Specimen Description   Final    URINE, CLEAN CATCH Performed at [facility], [address] [place], [place] [address]    Special Requests   Final    NONE Performed at [facility], [address] [place], [place] [address]    Culture MULTIPLE SPECIES PRESENT, SUGGEST RECOLLECTION (A)  Final   Report Status 02/07/2024 FINAL  Final  Blood culture (routine x 2)     Status: None (Preliminary result)   Collection Time: 02/06/24 12:48 AM   Specimen: BLOOD LEFT HAND  Result Value Ref Range Status   Specimen Description   Final    BLOOD LEFT HAND Performed at [facility], [address] [place], [place] [address]    Special Requests   Final    BOTTLES DRAWN AEROBIC AND ANAEROBIC Blood Culture adequate volume Performed at [facility], [address] [place], [place] [address]    Culture   Final    NO GROWTH 2 DAYS Performed at [facility], [address], [place], [place] [address]    Report Status PENDING  Incomplete  Blood culture (routine x 2)     Status: None (Preliminary result)   Collection Time: 02/06/24 12:48 AM   Specimen: BLOOD LEFT FOREARM  Result Value Ref Range Status    Specimen Description   Final    BLOOD LEFT FOREARM Performed at [facility], [address] [place], [place] [address]    Special Requests   Final    BOTTLES DRAWN AEROBIC ONLY Blood Culture results may not be optimal due to an inadequate volume of blood received in culture bottles Performed at [facility], [address] [place], [place] [address]    Culture   Final    NO GROWTH 2 DAYS Performed at [facility], [address], [place], [place] [address]    Report Status PENDING  Incomplete         Radiology Studies: No results found.       Scheduled Meds:  amiodarone  200 mg Oral QPM   apixaban  5 mg Oral BID   fluticasone  furoate-vilanterol  1 puff Inhalation Daily   And   umeclidinium bromide  1 puff Inhalation Daily   insulin aspart  0-15 Units Subcutaneous TID WC   insulin aspart  0-5 Units Subcutaneous QHS   insulin glargine  60 Units Subcutaneous Daily   linezolid  600 mg Oral Q12H   metolazone  5 mg Oral Weekly   metoprolol tartrate  50 mg Oral BID   montelukast  10 mg Oral QHS   pantoprazole  40 mg Oral Daily   rOPINIRole  1 mg Oral QHS   rosuvastatin  40 mg Oral QHS   sertraline  50 mg Oral BID   tamsulosin  0.4 mg Oral Daily   tiZANidine  2 mg Oral QHS   torsemide  40 mg Oral BID   Continuous Infusions:  piperacillin-tazobactam (ZOSYN)  IV 3.375 g (02/08/24 0522)     LOS: 2 days    Time spent: 47 minutes spent on 02/08/2024 caring for this patient face-to-face including chart review, ordering labs/tests, documenting, discussion with nursing staff, consultants, updating family and interview/physical exam    Alvira Philips [place], DO [facility] Available via Epic secure chat 7am-7pm After these hours, please refer to coverage provider listed on [email or web address] 02/08/2024, 12:34 PM

## 2024-02-09 ENCOUNTER — Encounter: Payer: Self-pay | Admitting: Cardiology

## 2024-02-09 ENCOUNTER — Other Ambulatory Visit (HOSPITAL_COMMUNITY): Payer: Self-pay

## 2024-02-09 DIAGNOSIS — N492 Inflammatory disorders of scrotum: Secondary | ICD-10-CM | POA: Diagnosis not present

## 2024-02-09 LAB — COMPREHENSIVE METABOLIC PANEL
ALT: 17 U/L (ref 0–44)
AST: 21 U/L (ref 15–41)
Albumin: 2.8 g/dL — ABNORMAL LOW (ref 3.5–5.0)
Alkaline Phosphatase: 94 U/L (ref 38–126)
Anion gap: 10 (ref 5–15)
BUN: 20 mg/dL (ref 8–23)
CO2: 26 mmol/L (ref 22–32)
Calcium: 9.1 mg/dL (ref 8.9–10.3)
Chloride: 104 mmol/L (ref 98–111)
Creatinine, Ser: 1.26 mg/dL — ABNORMAL HIGH (ref 0.61–1.24)
GFR, Estimated: 58 mL/min — ABNORMAL LOW (ref >60)
Glucose, Bld: 105 mg/dL — ABNORMAL HIGH (ref 70–99)
Potassium: 3.5 mmol/L (ref 3.5–5.1)
Sodium: 140 mmol/L (ref 135–145)
Total Bilirubin: 1.1 mg/dL (ref 0.0–1.2)
Total Protein: 6.3 g/dL — ABNORMAL LOW (ref 6.5–8.1)

## 2024-02-09 LAB — CBC
HCT: 46.4 % (ref 39.0–52.0)
Hemoglobin: 14.1 g/dL (ref 13.0–17.0)
MCH: 28.9 pg (ref 26.0–34.0)
MCHC: 30.4 g/dL (ref 30.0–36.0)
MCV: 95.1 fL (ref 80.0–100.0)
Platelets: 178 10*3/uL (ref 150–400)
RBC: 4.88 MIL/uL (ref 4.22–5.81)
RDW: 15.8 % — ABNORMAL HIGH (ref 11.5–15.5)
WBC: 8.7 10*3/uL (ref 4.0–10.5)
nRBC: 0 % (ref 0.0–0.2)

## 2024-02-09 LAB — MAGNESIUM: Magnesium: 2.2 mg/dL (ref 1.7–2.4)

## 2024-02-09 LAB — GLUCOSE, CAPILLARY: Glucose-Capillary: 105 mg/dL — ABNORMAL HIGH (ref 70–99)

## 2024-02-09 MED ORDER — AMOXICILLIN-POT CLAVULANATE 875-125 MG PO TABS
1.0000 | ORAL_TABLET | Freq: Two times a day (BID) | ORAL | 0 refills | Status: AC
  Filled 2024-02-09: qty 20, 10d supply, fill #0

## 2024-02-09 MED ORDER — DOXYCYCLINE HYCLATE 100 MG PO TABS
100.0000 mg | ORAL_TABLET | Freq: Two times a day (BID) | ORAL | 0 refills | Status: AC
  Filled 2024-02-09: qty 20, 10d supply, fill #0

## 2024-02-09 NOTE — TOC Transition Note (Signed)
 Transition of Care [facility]) - Discharge Note   Patient Details  Name: Shawn Sullivan MRN: [id number] Date of Birth: 1946-03-21  Transition of Care [facility]) CM/SW Contact:  Diona Browner, LCSW Phone Number: 02/09/2024, 11:44 AM   Clinical Narrative:    Pt medically ready to d/c home. Pt recommended for HHPT. HHPT setup w/ [facility]. No DME needs. No further TOC needs.   Final next level of care: Home w Home Health Services Barriers to Discharge: Barriers Resolved   Patient Goals and CMS Choice Patient states their goals for this hospitalization and ongoing recovery are:: return home CMS Medicare.gov Compare Post Acute Care list provided to:: Patient Choice offered to / list presented to : Patient [place] ownership interest in [facility].provided to:: Patient    Discharge Placement                       Discharge Plan and Services Additional resources added to the After Visit Summary for                  DME Arranged: N/A DME Agency: NA       HH Arranged: PT HH Agency: [facility] Date HH Agency Contacted: 02/09/24 Time HH Agency Contacted: 1144 Representative spoke with at [facility] Agency: [employer]  Social Drivers of Health (SDOH) Interventions SDOH Screenings   Food Insecurity: No Food Insecurity (02/06/2024)  Housing: Low Risk  (02/06/2024)  Transportation Needs: No Transportation Needs (02/06/2024)  Utilities: Not At Risk (02/06/2024)  Alcohol Screen: Low Risk  (08/12/2023)  Depression (PHQ2-9): Medium Risk (12/20/2023)  Financial Resource Strain: Low Risk  (08/12/2023)  Physical Activity: Inactive (08/12/2023)  Social Connections: Socially Integrated (02/06/2024)  Stress: No Stress Concern Present (08/12/2023)  Tobacco Use: Medium Risk (02/06/2024)  Health Literacy: Adequate Health Literacy (08/12/2023)     Readmission Risk Interventions    02/08/2024    1:06 PM  Readmission Risk Prevention Plan  Transportation Screening Complete  PCP or Specialist  Appt within 5-7 Days Complete  Home Care Screening Complete  Medication Review (RN CM) Complete

## 2024-02-09 NOTE — Evaluation (Signed)
 Physical Therapy Evaluation Patient Details Name: Shawn Sullivan MRN: [id number] DOB: 31-Jul-1946 Today's Date: 02/09/2024  History of Present Illness  Shawn Sullivan is a 78 y.o. male admitted with cellulitis of scrotum. PMH: afib, HTN, COPD, diabetes  Clinical Impression  Pt admitted with above diagnosis. Pt from home, ind at baseline, using SPC in the yard and community and no AD in the home, enjoys "tinkering" in the garage and yard, spouse present at home completing cooking and cleaning. On eval, pt needing min A to power up to sitting, pulling on therapist's hand to fully upright trunk. Pt powers to stand with supv, amb in room completing turns and steps with RW, limited by scrotal pain, no overt LOB. Recommend HHPT, but pt reports he will improve "slowly on my own". Pt currently with functional limitations due to the deficits listed below (see PT Problem List). Pt will benefit from acute skilled PT to increase their independence and safety with mobility to allow discharge.           If plan is discharge home, recommend the following: Assistance with cooking/housework;Assist for transportation;Help with stairs or ramp for entrance   Can travel by private vehicle        Equipment Recommendations None recommended by PT  Recommendations for Other Services       Functional Status Assessment Patient has had a recent decline in their functional status and demonstrates the ability to make significant improvements in function in a reasonable and predictable amount of time.     Precautions / Restrictions Precautions Precautions: Fall Restrictions Weight Bearing Restrictions Per Provider Order: No      Mobility  Bed Mobility Overal bed mobility: Needs Assistance Bed Mobility: Supine to Sit     Supine to sit: Min assist, HOB elevated, Used rails     General bed mobility comments: pt using bedrails and elevated HOB, min A to fully upright trunk as pt pulls on therapist's  hand, increased time and difficulty due to scrotal swelling and pain with movement    Transfers Overall transfer level: Needs assistance Equipment used: Rolling walker (2 wheels) Transfers: Sit to/from Stand Sit to Stand: Supervision           General transfer comment: supv for STS from EOB, good hand placement and steadiness upon rising    Ambulation/Gait Ambulation/Gait assistance: Supervision Gait Distance (Feet): 30 Feet Assistive device: Rolling walker (2 wheels) Gait Pattern/deviations: Step-through pattern, Decreased stride length Gait velocity: decreased     General Gait Details: step through gait pattern, limited by pain, good steadiness, able to complete turns in room and navigate around obstacles, no overt LOB  Stairs            Wheelchair Mobility     Tilt Bed    Modified Rankin (Stroke Patients Only)       Balance Overall balance assessment: No apparent balance deficits (not formally assessed)                                           Pertinent Vitals/Pain Pain Assessment Pain Assessment: 0-10 Pain Score: 6  Pain Location: scrotum Pain Descriptors / Indicators: Sore ("like a vice") Pain Intervention(s): Limited activity within patient's tolerance, Monitored during session, Repositioned, Ice applied    Home Living Family/patient expects to be discharged to:: Private residence Living Arrangements: Spouse/significant other Available Help at Discharge: Family;Available 24  hours/day Type of Home: House Home Access: Stairs to enter Entrance Stairs-Rails: Doctor, general practice of Steps: 5 front, 4 back   Home Layout: One level Home Equipment: Cane - single Librarian, academic (2 wheels);Rollator (4 wheels);Wheelchair - manual;Shower seat;BSC/3in1      Prior Function Prior Level of Function : Independent/Modified Independent             Mobility Comments: pt reports using ind with in home amb, using SPC in  the community and yard ADLs Comments: pt reports ind with self care, spouse completes cooking and cleaning     Extremity/Trunk Assessment   Upper Extremity Assessment Upper Extremity Assessment: Overall WFL for tasks assessed    Lower Extremity Assessment Lower Extremity Assessment: Overall WFL for tasks assessed    Cervical / Trunk Assessment Cervical / Trunk Assessment: Normal  Communication   Communication Communication: No apparent difficulties    Cognition Arousal: Alert Behavior During Therapy: WFL for tasks assessed/performed   PT - Cognitive impairments: No apparent impairments                         Following commands: Intact       Cueing       General Comments      Exercises     Assessment/Plan    PT Assessment Patient needs continued PT services  PT Problem List Decreased activity tolerance;Decreased mobility;Pain;Obesity       PT Treatment Interventions DME instruction;Gait training;Stair training;Functional mobility training;Therapeutic activities;Therapeutic exercise;Balance training;Patient/family education;Manual techniques    PT Goals (Current goals can be found in the Care Plan section)  Acute Rehab PT Goals Patient Stated Goal: return home, less pain PT Goal Formulation: With patient/family Time For Goal Achievement: 02/23/24 Potential to Achieve Goals: Good    Frequency Min 2X/week     Co-evaluation               AM-PAC PT "6 Clicks" Mobility  Outcome Measure Help needed turning from your back to your side while in a flat bed without using bedrails?: A Little Help needed moving from lying on your back to sitting on the side of a flat bed without using bedrails?: A Little Help needed moving to and from a bed to a chair (including a wheelchair)?: A Little Help needed standing up from a chair using your arms (e.g., wheelchair or bedside chair)?: A Little Help needed to walk in hospital room?: A Little Help needed  climbing 3-5 steps with a railing? : A Little 6 Click Score: 18    End of Session Equipment Utilized During Treatment: Gait belt Activity Tolerance: Patient tolerated treatment well Patient left: in bed;with call bell/phone within reach;with family/visitor present Nurse Communication: Mobility status PT Visit Diagnosis: Other abnormalities of gait and mobility (R26.89);Pain Pain - part of body:  (scrotum)    Time: 6962-9528 PT Time Calculation (min) (ACUTE ONLY): 21 min   Charges:   PT Evaluation $PT Eval Low Complexity: 1 Low   PT General Charges $$ ACUTE PT VISIT: 1 Visit         Tori Jaidee Stipe PT, DPT 02/09/24, 9:48 AM

## 2024-02-09 NOTE — Plan of Care (Signed)
  Problem: Education: Goal: Ability to describe self-care measures that may prevent or decrease complications (Diabetes Survival Skills Education) will improve Outcome: Progressing Goal: Individualized Educational Video(s) Outcome: Progressing   Problem: Coping: Goal: Ability to adjust to condition or change in health will improve Outcome: Progressing   Problem: Fluid Volume: Goal: Ability to maintain a balanced intake and output will improve Outcome: Progressing   Problem: Health Behavior/Discharge Planning: Goal: Ability to identify and utilize available resources and services will improve Outcome: Progressing   Problem: Metabolic: Goal: Ability to maintain appropriate glucose levels will improve Outcome: Progressing   Problem: Nutritional: Goal: Maintenance of adequate nutrition will improve Outcome: Progressing Goal: Progress toward achieving an optimal weight will improve Outcome: Progressing   Problem: Skin Integrity: Goal: Risk for impaired skin integrity will decrease Outcome: Progressing   Problem: Clinical Measurements: Goal: Ability to maintain clinical measurements within normal limits will improve Outcome: Progressing Goal: Will remain free from infection Outcome: Progressing Goal: Diagnostic test results will improve Outcome: Progressing Goal: Respiratory complications will improve Outcome: Progressing   Problem: Activity: Goal: Risk for activity intolerance will decrease Outcome: Progressing   Problem: Nutrition: Goal: Adequate nutrition will be maintained Outcome: Progressing

## 2024-02-09 NOTE — Discharge Summary (Signed)
 Physician Discharge Summary  Shawn Sullivan YNW:[id number] DOB: 06-26-1946 DOA: 02/05/2024  PCP: Blane Ohara, MD  Admit date: 02/05/2024 Discharge date: 02/09/2024  Admitted From: Home Disposition: Home  Recommendations for Outpatient Follow-up:  Follow up with PCP in 1-2 weeks Follow-up with urology as needed Referral placed to oncology for further workup of retroperitoneal mass concerning for liposarcoma Continue antibiotics with doxycycline and Augmentin to complete course for scrotal cellulitis, orchitis, epididymitis.  Home Health: No Equipment/Devices: None  Discharge Condition: Stable CODE STATUS: Full code Diet recommendation: Heart healthy/consistent carbohydrate diet  History of present illness:  Shawn Sullivan is a 78 y.o. male with past medical history significant for paroxysmal atrial fibrillation on apixaban, HTN, HLD, RLS, COPD, DM2, OSA not on CPAP, chronic low back pain, CKD stage IIIb, history of combined systolic/diastolic congestive heart failure who presented to [facility] ED on 02/05/2024 from home with progressive scrotal swelling and pain over the last 2-3 days.  Patient reports scratch to area while cleaning himself.  Denies any discharge.  Reports has been taking his home hydrocodone with some mild relief.  Denies fever, no chills, no nausea/vomiting/diarrhea, no dysuria.   In the ED, temperature 97.9 F, HR 100, RR 15, BP 174/158, SpO2 93% on room air.  WBC 13.1, hemoglobin 14.3, platelet count 120.  Sodium 138, potassium 3.5, chloride 102, CO2 26, glucose 155, BUN 20, creat 1.43.  AST 27, ALT 19, total bilirubin 1.7.  Urinalysis unrevealing.  CT abdomen/pelvis with contrast with no acute intra-abdominal pathology, large lipomatous mass within the retroperitoneum demonstrated infiltrative soft tissue components in keeping with a retroperitoneal liposarcoma measuring 17.1 x 19.7 x 19.3 dimension, cholelithiasis, mild lobar contour liver and  hypertrophy of the left hepatic lobe likely changes of underlying cirrhosis, moderate splenomegaly, moderate hiatal hernia, moderate prostatic hypertrophy, aortic atherosclerosis.  Ultrasound scrotum with a large heterogeneous hyperemic right epididymis consistent with epididymitis, right testicle also heterogeneous suspicious for orchitis, diffuse scrotal skin thickening and edema may be reactive versus cellulitis, no discrete subcutaneous fluid collection, complex right hydrocele.  He received fentanyl 50 mcg IVP, LR 1000 mL liter bolus, linezolid 600 mg IVPB and Zosyn 3.375 g IVPB. TRH consulted for admission for further evaluation management of scrotal cellulitis, orchitis, epididymitis.  Hospital course:  Scrotal cellulitis Orchitis Epididymitis Patient presenting to ED with progressive swelling, pain to his scrotal region following a "scratch" while cleaning himself.  Patient was afebrile with elevated WBC count of 13.1 on admission.  Scrotal ultrasound with  large heterogeneous hyperemic right epididymis consistent with epididymitis, right testicle also heterogeneous suspicious for orchitis, diffuse scrotal skin thickening and edema may be reactive versus cellulitis, no discrete subcutaneous fluid collection, complex right hydrocele.  Urine culture with multiple species.  GC/chlamydia PCR probe negative.  Patient was started on linezolid and Zosyn with resolution of leukocytosis with WBC count 8.7 Thomson discharge.  Will discharge on doxycycline and Augmentin to complete a 14-day course.  Continue supportive care, scrotal elevation.  Outpatient follow-up with urology as needed.  Outpatient follow-up with PCP 1 week.   Retroperitoneal mass concerning for liposarcoma CT abdomen/pelvis notable for 17.1 x 19.7 x 19.3 cm infiltrative soft tissue mass retroperitoneum.  LDH 137.  Discussed with patient obtaining biopsy, will decline at this time and prefers outpatient follow-up with oncology.  Referral  placed.   Hypokalemia Repleted during hospitalization.   Paroxysmal atrial fibrillation Amiodarone 200 mg p.o. daily, Metoprolol tartrate 50 mg p.o. twice daily, Eliquis 5 mg p.o. twice daily  Chronic combined systolic/diastolic congestive heart failure, compensated Essential hypertension Metoprolol tartrate 50 g p.o. twice daily, Torsemide 40 mg p.o. twice daily, Metolazone 5 g p.o. weekly   Hyperlipidemia Crestor 40 mg p.o. daily   DM2 Continue Tresiba 60 units subcutaneously daily, semaglutide, Farxiga.   CKD stage IIIb Creatinine stable, 1.26 at time of discharge.   Anxiety/depression Sertraline 50 mg p.o. twice daily   BPH Tamsulosin 0.4 mg p.o. daily   COPD Stable, continue Breztri inhaler   OSA Does not utilize CPAP at baseline.   Obesity, class III Body mass index is 41.43 kg/m.    Weakness/debility/deconditioning: He recommends home health on discharge.  Discharge Diagnoses:  Principal Problem:   Cellulitis of scrotum Active Problems:   PAF (paroxysmal atrial fibrillation) (HCC)   CKD stage 3b, GFR 30-44 ml/min (HCC)   COPD (chronic obstructive pulmonary disease) (HCC)   Obstructive sleep apnea   Restrictive lung disease   Restless legs syndrome   Hyperlipidemia   Depressive disorder   Class 3 severe obesity due to excess calories with serious comorbidity and body mass index (BMI) of 40.0 to 44.9 in adult [facility])   Diabetic polyneuropathy (HCC)   Gastroesophageal reflux disease without esophagitis   Chronic combined systolic and diastolic congestive heart failure (HCC)   Mild protein malnutrition (HCC)   Hyperbilirubinemia   Thrombocytopenia (HCC)   Retroperitoneal mass   Splenomegaly   Aortic atherosclerosis [facility])    Discharge Instructions  Discharge Instructions     Ambulatory referral to Hematology / Oncology   Complete by: As directed    Retroperitoneal mass concerning for liposarcoma   Call MD for:  difficulty breathing, headache or  visual disturbances   Complete by: As directed    Call MD for:  extreme fatigue   Complete by: As directed    Call MD for:  persistant dizziness or light-headedness   Complete by: As directed    Call MD for:  persistant nausea and vomiting   Complete by: As directed    Call MD for:  severe uncontrolled pain   Complete by: As directed    Call MD for:  temperature >100.4   Complete by: As directed    Diet - low sodium heart healthy   Complete by: As directed    Increase activity slowly   Complete by: As directed       Allergies as of 02/09/2024       Reactions   Androgel [testosterone] Other (See Comments)   Pedal edema   Biaxin [clarithromycin] Other (See Comments)   Taste perception alteration   Duloxetine Other (See Comments)   Hallucinations   Gabapentin Other (See Comments)   hallucinations   Ibuprofen Other (See Comments)   GI upset   Lyrica [pregabalin] Swelling   Spironolactone Other (See Comments)   Unknown         Medication List     STOP taking these medications    fluticasone 50 MCG/ACT nasal spray Commonly known as: FLONASE       TAKE these medications    acetaminophen 500 MG tablet Commonly known as: TYLENOL Take 1,000 mg by mouth every 6 (six) hours as needed for moderate pain or headache.   amiodarone 200 MG tablet Commonly known as: PACERONE TAKE ONE TABLET BY MOUTH EVERY EVENING   amoxicillin-clavulanate 875-125 MG tablet Commonly known as: AUGMENTIN Take 1 tablet by mouth 2 (two) times daily for 10 days.   apixaban 5 MG Tabs tablet Commonly known as: ELIQUIS  Take 1 tablet (5 mg total) by mouth 2 (two) times daily.   Breztri Aerosphere 160-9-4.8 MCG/ACT Aero Generic drug: budeson-glycopyrrolate-formoterol Inhale 2 puffs into the lungs 2 (two) times daily.   cholecalciferol 25 MCG (1000 UNIT) tablet Commonly known as: VITAMIN D3 Take 1,000 Units by mouth daily.   dapagliflozin propanediol 10 MG Tabs tablet Commonly known as:  Farxiga Take 1 tablet (10 mg total) by mouth daily before breakfast.   doxycycline 100 MG tablet Commonly known as: VIBRA-TABS Take 1 tablet (100 mg total) by mouth 2 (two) times daily for 10 days.   HYDROcodone-acetaminophen 10-325 MG tablet Commonly known as: NORCO Take 1-2 tablets by mouth 2 (two) times daily as needed for moderate pain (pain score 4-6).   icosapent Ethyl 1 g capsule Commonly known as: Vascepa Take 2 capsules (2 g total) by mouth 2 (two) times daily. What changed: how much to take   ipratropium-albuterol 0.5-2.5 (3) MG/3ML Soln Commonly known as: DUONEB INHALE THE CONTENTS OF 1 VIAL VIA NEBULIZER EVERY 6 HOURS AS NEEDED FOR SHORTNESS OF BREATH   levocetirizine 5 MG tablet Commonly known as: XYZAL Take 5 mg by mouth daily as needed for allergies.   metolazone 5 MG tablet Commonly known as: ZAROXOLYN Take 5 mg by mouth once a week. May take an additional 5 mg tablet for swelling if needed.   metoprolol tartrate 50 MG tablet Commonly known as: LOPRESSOR TAKE 1 TABLET BY MOUTH EVERY MORNING, TAKE 1 TABLET IN THE EVENING AND TAKE 1 TABLET AT BEDTIME   montelukast 10 MG tablet Commonly known as: SINGULAIR TAKE 1 TABLET BY MOUTH EVERY DAY AT BEDTIME   Omeprazole 20 MG Tbec Take 20 mg by mouth 2 (two) times daily before a meal.   Ozempic (1 MG/DOSE) 2 MG/1.5ML Sopn Generic drug: Semaglutide (1 MG/DOSE) Inject 1 mg into the skin once a week.   potassium chloride SA 20 MEQ tablet Commonly known as: KLOR-CON M TAKE 2 TABLETS BY MOUTH AT BREAKFAST AND AT BEDTIME   rOPINIRole 1 MG tablet Commonly known as: REQUIP Take 1 tablet (1 mg total) by mouth at bedtime. What changed:  when to take this reasons to take this   rosuvastatin 40 MG tablet Commonly known as: CRESTOR TAKE 1 TABLET BY MOUTH EVERY DAY AT BEDTIME   sertraline 50 MG tablet Commonly known as: ZOLOFT TAKE 1 TABLET BY MOUTH AT BREAKFAST AND AT BEDTIME   tamsulosin 0.4 MG Caps  capsule Commonly known as: FLOMAX TAKE 1 CAPSULE BY MOUTH EVERY DAY AT BEDTIME   tiZANidine 2 MG tablet Commonly known as: ZANAFLEX TAKE 1 TABLET BY MOUTH EVERY DAY AT BEDTIME   torsemide 20 MG tablet Commonly known as: DEMADEX TAKE TWO (2) TABLETS BY MOUTH EACH MORNING AND TAKE 2 TABLETS AT [employer] FlexTouch 100 UNIT/ML FlexTouch Pen Generic drug: insulin degludec Inject 60 Units into the skin every evening.   Ventolin HFA 108 (90 Base) MCG/ACT inhaler Generic drug: albuterol INHALE 2 PUFFS BY MOUTH EVERY 4 HOURS AS NEEDED FOR SHORTNESS OF BREATH AND WHEEZING        Follow-up Information     Cox, Kirsten, MD. Schedule an appointment as soon as possible for a visit in 1 week(s).   Specialty: Family Medicine Contact information: [address] [place] [address] [phone]         [facility]. Schedule an appointment as soon as possible for a visit.   Contact information: [address] [place] [address] [phone]        [facility] Dept Of [place]. Schedule an appointment as soon as possible for a visit.   Specialty: Oncology Why: Retroperitoneal mass concerning for liposarcoma Contact information: [address] [place] [address] [phone]               Allergies  Allergen Reactions   Androgel [Testosterone] Other (See Comments)    Pedal edema    Biaxin [Clarithromycin] Other (See Comments)    Taste perception alteration   Duloxetine Other (See Comments)    Hallucinations   Gabapentin Other (See Comments)    hallucinations   Ibuprofen Other (See Comments)    GI upset    Lyrica [Pregabalin] Swelling   Spironolactone Other (See Comments)    Unknown     Consultations: None   Procedures/Studies: CT ABDOMEN PELVIS W CONTRAST Result Date: 02/06/2024 CLINICAL DATA:  Sepsis, necrotizing fasciitis EXAM: CT ABDOMEN  AND PELVIS WITH CONTRAST TECHNIQUE: Multidetector CT imaging of the abdomen and pelvis was performed using the standard protocol following bolus administration of intravenous contrast. RADIATION DOSE REDUCTION: This exam was performed according to the departmental dose-optimization program which includes automated exposure control, adjustment of the mA and/or kV according to patient size and/or use of iterative reconstruction technique. CONTRAST:  OMNIPAQUE IOHEXOL 300 MG/ML  SOLN COMPARISON:  None Available. FINDINGS: Lower chest: No acute abnormality. Consolidative changes within the visualized posterior basal right lower lobe with traction bronchiolectasis likely representing post inflammatory scarring. Moderate hiatal hernia with herniation of retroperitoneal fat into the lower thorax. Hepatobiliary: Cholelithiasis without superimposed pericholecystic inflammatory change. Mild lobular contour of the liver and relative hypertrophy of the left hepatic lobe may reflect changes of underlying cirrhosis, but is nonspecific. Tiny cyst noted within the left hepatic lobe. No definite enhancing intrahepatic mass identified. No intra or extrahepatic biliary ductal dilation. Pancreas: Unremarkable Spleen: Moderate splenomegaly is present the spleen measuring 17.9 cm in greatest dimension. No intrasplenic lesion identified. Adrenals/Urinary Tract: The adrenal glands are unremarkable. The right kidney is displaced superiorly appears non rotated into the anterior-posterior dimension a lipomatous mass within the retroperitoneum demonstrating infiltrative soft tissue components, though this represents a minority the overall mass which is largely consistent of macroscopic fat. This is most in keeping with a retroperitoneal liposarcoma and measures roughly 17.1 x 19.7 x 19.3 cm in greatest dimension. This is incompletely included on this examination. This appears to abut Gerota's fascia but direct invasion Gerota's space is  not clearly identified. Simple cortical cyst noted within the left kidney for which no follow-up imaging is recommended. The kidneys are otherwise unremarkable. Bladder unremarkable. Stomach/Bowel: Bowel is incompletely included within the field of view. The descending colon is partially excluded from view. Visualized stomach, small, and large bowel are unremarkable. The appendix is absent. No free intraperitoneal fluid or gas. Vascular/Lymphatic: Aortic atherosclerosis. No enlarged abdominal or pelvic lymph nodes. Reproductive: Moderate prostatic hypertrophy Other: None significant Musculoskeletal: Postsurgical changes are seen within lumbar spine L4-S1 lumbar fusion and L4-5 posterior decompression. Advanced degenerative changes are seen within the lumbar spine. No acute bone abnormality. IMPRESSION: 1. No acute intra-abdominal pathology identified. No definite radiographic explanation for the patient's reported symptoms. 2. 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 This is  incompletely included on this examination. 3. Cholelithiasis. 4. Mild lobular contour of the liver and relative hypertrophy of the left hepatic lobe may reflect changes of underlying cirrhosis, but is not definitive. This could be further assessed with tissue sampling or hepatic elastography 5. Moderate splenomegaly, possibly reflecting changes of portal venous hypertension. 6. Moderate hiatal hernia. 7. Moderate prostatic hypertrophy. Aortic Atherosclerosis (ICD10-I70.0). Electronically Signed   By: Helyn Numbers M.D.   On: 02/06/2024 04:12   US Scrotum Result Date: 02/05/2024 CLINICAL DATA:  scrotal pain and swelling EXAM: ULTRASOUND OF SCROTUM TECHNIQUE: Complete ultrasound examination of the testicles, epididymis, and other scrotal structures was performed.  COMPARISON:  None Available. FINDINGS: Right testicle Measurements: 3.9 x 3 x 3.5 cm. Heterogeneous echogenicity. An area of diminished echogenicity peripherally but no discrete mass. Blood flow is demonstrated. Left testicle Measurements: 4.1 x 2.1 x 3.4 cm. Homogeneous echogenicity. No mass or microlithiasis visualized. Blood flow is demonstrated. Right epididymis:  Enlarged, heterogeneous and hyperemic. Left epididymis:  Not well delineated. Hydrocele:  Large complex on the right with internal septations. Varicocele:  None visualized. Diffuse scrotal skin thickening and hyperemia. The degree of scrotal edema limits assessment. IMPRESSION: 1. Enlarged heterogeneous hyperemic right epididymis consistent with epididymitis. The adjacent right testicle is also heterogeneous, suspicious for orchitis. 2. Diffuse scrotal skin thickening and edema, may be reactive or cellulitis. No discrete subcutaneous fluid collection. If there is concern for soft tissue gas, recommend CT. 3. Complex right hydrocele. Electronically Signed   By: Narda Rutherford M.D.   On: 02/05/2024 21:12     Subjective: Patient seen examined bedside, lying in bed.  Spouse present.  Swelling to scrotum slowly improving.  Ready for discharge home.  Discussed will continue antibiotics on discharge to complete 14-day course and recommend follow-up with urology as needed as well as PCP in 1 week.  No other specific complaints, concerns or questions at this time.  Denies headache, no dizziness, no chest pain, no palpitations, no shortness of breath, no abdominal pain, no fever/chills/night sweats, no nausea/vomiting/diarrhea, no focal weakness, no fatigue, no paresthesias.  No acute events overnight per nurse staff.  Discharge Exam: Vitals:   02/09/24 0521 02/09/24 0925  BP: (!) 161/94   Pulse: 79   Resp: 16   Temp: (!) 97.5 F (36.4 C)   SpO2: 96% 93%   Vitals:   02/08/24 1228 02/08/24 2036 02/09/24 0521 02/09/24 0925  BP: (!) 155/95 (!)  146/93 (!) 161/94   Pulse: 93 86 79   Resp: 19 16 16    Temp: 97.8 F (36.6 C) 97.8 F (36.6 C) (!) 97.5 F (36.4 C)   TempSrc: Oral Oral Oral   SpO2: 95% 93% 96% 93%  Weight:      Height:        Physical Exam: GEN: NAD, alert and oriented x 3, obese HEENT: NCAT, PERRL, EOMI, sclera clear, MMM PULM: CTAB w/o wheezes/crackles, normal respiratory effort, on room air CV: RRR w/o M/G/R GI: abd soft, NTND, NABS, no R/G/M GU: Scrotal erythema with edema, TTP greatest on right side with thickened skin MSK: no peripheral edema, muscle strength globally intact 5/5 bilateral upper/lower extremities NEURO: No focal neurological deficit PSYCH: normal mood/affect Integumentary: Scrotum as above, otherwise no other concerning rashes/lesions/wounds noted on exposed skin surfaces.     The results of significant diagnostics from this hospitalization (including imaging, microbiology, ancillary and laboratory) are listed below for reference.     Microbiology: Recent Results (from the past 240 hours)  Urine  Culture     Status: Abnormal   Collection Time: 02/05/24  9:01 PM   Specimen: Urine, Clean Catch  Result Value Ref Range Status   Specimen Description   Final    URINE, CLEAN CATCH Performed at [facility], [address] [place], [place] [address]    Special Requests   Final    NONE Performed at [facility], [address] [place], [place] [address]    Culture MULTIPLE SPECIES PRESENT, SUGGEST RECOLLECTION (A)  Final   Report Status 02/07/2024 FINAL  Final  Blood culture (routine x 2)     Status: None (Preliminary result)   Collection Time: 02/06/24 12:48 AM   Specimen: BLOOD LEFT HAND  Result Value Ref Range Status   Specimen Description   Final    BLOOD LEFT HAND Performed at [facility], [address] [place], [place] [address]    Special Requests   Final    BOTTLES DRAWN AEROBIC AND ANAEROBIC Blood Culture adequate volume Performed at  [facility], [address] [place], [place] [address]    Culture   Final    NO GROWTH 3 DAYS Performed at [facility], [address], [place], [place] [address]    Report Status PENDING  Incomplete  Blood culture (routine x 2)     Status: None (Preliminary result)   Collection Time: 02/06/24 12:48 AM   Specimen: BLOOD LEFT FOREARM  Result Value Ref Range Status   Specimen Description   Final    BLOOD LEFT FOREARM Performed at [facility], [address] [place], [place] [address]    Special Requests   Final    BOTTLES DRAWN AEROBIC ONLY Blood Culture results may not be optimal due to an inadequate volume of blood received in culture bottles Performed at [facility], [address] [place], [place] [address]    Culture   Final    NO GROWTH 3 DAYS Performed at [facility], [address] [place], [place] [address]    Report Status PENDING  Incomplete     Labs: BNP (last 3 results) No results for input(s): "BNP" in the last 8760 hours. Basic Metabolic Panel: Recent Labs  Lab 02/06/24 0048 02/07/24 0412 02/08/24 0421 02/09/24 0454  NA 138 138 136 140  K 3.5 3.6 3.4* 3.5  CL 102 106 105 104  CO2 26 23 23 26   GLUCOSE 155* 125* 157* 105*  BUN 20 16 18 20   CREATININE 1.43* 1.21 1.30* 1.26*  CALCIUM 9.1 8.9 8.8* 9.1  MG  --   --  2.2 2.2   Liver Function Tests: Recent Labs  Lab 02/06/24 0048 02/07/24 0412 02/08/24 0421 02/09/24 0454  AST 27 22 21 21   ALT 19 17 18 17   ALKPHOS 87 81 89 94  BILITOT 1.7* 1.7* 1.2 1.1  PROT 7.0 6.3* 6.2* 6.3*  ALBUMIN 3.3* 2.8* 2.7* 2.8*   No results for input(s): "LIPASE", "AMYLASE" in the last 168 hours. No results for input(s): "AMMONIA" in the last 168 hours. CBC: Recent Labs  Lab 02/06/24 0048 02/07/24 0412 02/08/24 0421 02/09/24 0454  WBC 13.1* 8.8 8.4 8.7  NEUTROABS 10.5*  --   --   --   HGB 14.3 14.3 14.2 14.1  HCT 44.7 46.2 46.4 46.4  MCV 91.6 95.3 93.7 95.1   PLT 120* 121* 158 178   Cardiac Enzymes:  No results for input(s): "CKTOTAL", "CKMB", "CKMBINDEX", "TROPONINI" in the last 168 hours. BNP: Invalid input(s): "POCBNP" CBG: Recent Labs  Lab 02/08/24 0744 02/08/24 1125 02/08/24 1637 02/08/24 2145 02/09/24 0742  GLUCAP 113* 133* 145* 146* 105*   D-Dimer No results for input(s): "DDIMER" in the last 72 hours. Hgb A1c No results for input(s): "HGBA1C" in the last 72 hours. Lipid Profile No results for input(s): "CHOL", "HDL", "LDLCALC", "TRIG", "CHOLHDL", "LDLDIRECT" in the last 72 hours. Thyroid function studies No results for input(s): "TSH", "T4TOTAL", "T3FREE", "THYROIDAB" in the last 72 hours.  Invalid input(s): "FREET3" Anemia work up No results for input(s): "VITAMINB12", "FOLATE", "FERRITIN", "TIBC", "IRON", "RETICCTPCT" in the last 72 hours. Urinalysis    Component Value Date/Time   COLORURINE YELLOW 02/05/2024 2101   APPEARANCEUR CLEAR 02/05/2024 2101   LABSPEC 1.010 02/05/2024 2101   PHURINE 6.5 02/05/2024 2101   GLUCOSEU >=500 (A) 02/05/2024 2101   HGBUR TRACE (A) 02/05/2024 2101   BILIRUBINUR NEGATIVE 02/05/2024 2101   BILIRUBINUR large (A) 05/06/2023 1543   KETONESUR NEGATIVE 02/05/2024 2101   PROTEINUR NEGATIVE 02/05/2024 2101   UROBILINOGEN 2.0 (A) 05/06/2023 1543   NITRITE NEGATIVE 02/05/2024 2101   LEUKOCYTESUR NEGATIVE 02/05/2024 2101   Sepsis Labs Recent Labs  Lab 02/06/24 0048 02/07/24 0412 02/08/24 0421 02/09/24 0454  WBC 13.1* 8.8 8.4 8.7   Microbiology Recent Results (from the past 240 hours)  Urine Culture     Status: Abnormal   Collection Time: 02/05/24  9:01 PM   Specimen: Urine, Clean Catch  Result Value Ref Range Status   Specimen Description   Final    URINE, CLEAN CATCH Performed at [facility], [address] [place], [place] [address]    Special Requests   Final    NONE Performed at [facility], [address] [place], [place] [address]     Culture MULTIPLE SPECIES PRESENT, SUGGEST RECOLLECTION (A)  Final   Report Status 02/07/2024 FINAL  Final  Blood culture (routine x 2)     Status: None (Preliminary result)   Collection Time: 02/06/24 12:48 AM   Specimen: BLOOD LEFT HAND  Result Value Ref Range Status   Specimen Description   Final    BLOOD LEFT HAND Performed at [facility], [address] [place], [place] [address]    Special Requests   Final    BOTTLES DRAWN AEROBIC AND ANAEROBIC Blood Culture adequate volume Performed at [facility], [address] [place], [place] [address]    Culture   Final    NO GROWTH 3 DAYS Performed at [facility], [address] [place], [place] [id number]    Report Status PENDING  Incomplete  Blood culture (routine x 2)     Status: None (Preliminary result)   Collection Time: 02/06/24 12:48 AM   Specimen: BLOOD LEFT FOREARM  Result Value Ref Range Status   Specimen Description   Final    BLOOD LEFT FOREARM Performed at [facility], [address] [place], [place] [address]    Special Requests   Final    BOTTLES DRAWN AEROBIC ONLY Blood Culture results may not be optimal due to an inadequate volume of blood received in culture bottles Performed at [facility], [address] [place], [place] [address]    Culture   Final    NO GROWTH 3 DAYS Performed at [facility], [address] [place], [place] [address]    Report Status PENDING  Incomplete     Time coordinating discharge: Over 30 minutes  SIGNED:   Alvira Philips [place], DO  [facility] 02/09/2024, 11:32 AM

## 2024-02-09 NOTE — Plan of Care (Signed)
  Problem: Education: Goal: Ability to describe self-care measures that may prevent or decrease complications (Diabetes Survival Skills Education) will improve Outcome: Adequate for Discharge Goal: Individualized Educational Video(s) Outcome: Adequate for Discharge   Problem: Coping: Goal: Ability to adjust to condition or change in health will improve Outcome: Adequate for Discharge   Problem: Fluid Volume: Goal: Ability to maintain a balanced intake and output will improve Outcome: Adequate for Discharge   Problem: Health Behavior/Discharge Planning: Goal: Ability to identify and utilize available resources and services will improve Outcome: Adequate for Discharge Goal: Ability to manage health-related needs will improve Outcome: Adequate for Discharge   Problem: Metabolic: Goal: Ability to maintain appropriate glucose levels will improve Outcome: Adequate for Discharge   Problem: Nutritional: Goal: Maintenance of adequate nutrition will improve Outcome: Adequate for Discharge Goal: Progress toward achieving an optimal weight will improve Outcome: Adequate for Discharge   Problem: Skin Integrity: Goal: Risk for impaired skin integrity will decrease Outcome: Adequate for Discharge   Problem: Tissue Perfusion: Goal: Adequacy of tissue perfusion will improve Outcome: Adequate for Discharge   Problem: Education: Goal: Knowledge of General Education information will improve Description: Including pain rating scale, medication(s)/side effects and non-pharmacologic comfort measures Outcome: Adequate for Discharge   Problem: Health Behavior/Discharge Planning: Goal: Ability to manage health-related needs will improve Outcome: Adequate for Discharge   Problem: Clinical Measurements: Goal: Ability to maintain clinical measurements within normal limits will improve Outcome: Adequate for Discharge Goal: Will remain free from infection Outcome: Adequate for Discharge Goal:  Diagnostic test results will improve Outcome: Adequate for Discharge Goal: Respiratory complications will improve Outcome: Adequate for Discharge Goal: Cardiovascular complication will be avoided Outcome: Adequate for Discharge   Problem: Activity: Goal: Risk for activity intolerance will decrease Outcome: Adequate for Discharge   Problem: Nutrition: Goal: Adequate nutrition will be maintained Outcome: Adequate for Discharge   Problem: Coping: Goal: Level of anxiety will decrease Outcome: Adequate for Discharge   Problem: Elimination: Goal: Will not experience complications related to bowel motility Outcome: Adequate for Discharge Goal: Will not experience complications related to urinary retention Outcome: Adequate for Discharge   Problem: Pain Managment: Goal: General experience of comfort will improve and/or be controlled Outcome: Adequate for Discharge   Problem: Safety: Goal: Ability to remain free from injury will improve Outcome: Adequate for Discharge   Problem: Skin Integrity: Goal: Risk for impaired skin integrity will decrease Outcome: Adequate for Discharge   Problem: Clinical Measurements: Goal: Ability to avoid or minimize complications of infection will improve Outcome: Adequate for Discharge   Problem: Skin Integrity: Goal: Skin integrity will improve Outcome: Adequate for Discharge

## 2024-02-10 ENCOUNTER — Ambulatory Visit: Payer: Medicare HMO | Admitting: Cardiology

## 2024-02-11 LAB — CULTURE, BLOOD (ROUTINE X 2)
Culture: NO GROWTH
Culture: NO GROWTH
Special Requests: ADEQUATE

## 2024-02-15 ENCOUNTER — Ambulatory Visit: Payer: Self-pay | Admitting: Family Medicine

## 2024-02-15 ENCOUNTER — Other Ambulatory Visit: Payer: Self-pay

## 2024-02-15 DIAGNOSIS — E441 Mild protein-calorie malnutrition: Secondary | ICD-10-CM | POA: Diagnosis not present

## 2024-02-15 DIAGNOSIS — E1122 Type 2 diabetes mellitus with diabetic chronic kidney disease: Secondary | ICD-10-CM | POA: Diagnosis not present

## 2024-02-15 DIAGNOSIS — N453 Epididymo-orchitis: Secondary | ICD-10-CM | POA: Diagnosis not present

## 2024-02-15 DIAGNOSIS — G894 Chronic pain syndrome: Secondary | ICD-10-CM | POA: Diagnosis not present

## 2024-02-15 DIAGNOSIS — B379 Candidiasis, unspecified: Secondary | ICD-10-CM

## 2024-02-15 DIAGNOSIS — K219 Gastro-esophageal reflux disease without esophagitis: Secondary | ICD-10-CM | POA: Diagnosis not present

## 2024-02-15 DIAGNOSIS — D696 Thrombocytopenia, unspecified: Secondary | ICD-10-CM | POA: Diagnosis not present

## 2024-02-15 DIAGNOSIS — R161 Splenomegaly, not elsewhere classified: Secondary | ICD-10-CM | POA: Diagnosis not present

## 2024-02-15 DIAGNOSIS — G4733 Obstructive sleep apnea (adult) (pediatric): Secondary | ICD-10-CM | POA: Diagnosis not present

## 2024-02-15 DIAGNOSIS — J449 Chronic obstructive pulmonary disease, unspecified: Secondary | ICD-10-CM | POA: Diagnosis not present

## 2024-02-15 DIAGNOSIS — E876 Hypokalemia: Secondary | ICD-10-CM | POA: Diagnosis not present

## 2024-02-15 DIAGNOSIS — I480 Paroxysmal atrial fibrillation: Secondary | ICD-10-CM | POA: Diagnosis not present

## 2024-02-15 DIAGNOSIS — N1832 Chronic kidney disease, stage 3b: Secondary | ICD-10-CM | POA: Diagnosis not present

## 2024-02-15 DIAGNOSIS — F32A Depression, unspecified: Secondary | ICD-10-CM | POA: Diagnosis not present

## 2024-02-15 DIAGNOSIS — E785 Hyperlipidemia, unspecified: Secondary | ICD-10-CM | POA: Diagnosis not present

## 2024-02-15 DIAGNOSIS — M545 Low back pain, unspecified: Secondary | ICD-10-CM | POA: Diagnosis not present

## 2024-02-15 DIAGNOSIS — J984 Other disorders of lung: Secondary | ICD-10-CM | POA: Diagnosis not present

## 2024-02-15 DIAGNOSIS — N492 Inflammatory disorders of scrotum: Secondary | ICD-10-CM | POA: Diagnosis not present

## 2024-02-15 DIAGNOSIS — I130 Hypertensive heart and chronic kidney disease with heart failure and stage 1 through stage 4 chronic kidney disease, or unspecified chronic kidney disease: Secondary | ICD-10-CM | POA: Diagnosis not present

## 2024-02-15 DIAGNOSIS — I5042 Chronic combined systolic (congestive) and diastolic (congestive) heart failure: Secondary | ICD-10-CM | POA: Diagnosis not present

## 2024-02-15 DIAGNOSIS — E66813 Obesity, class 3: Secondary | ICD-10-CM | POA: Diagnosis not present

## 2024-02-15 DIAGNOSIS — R1900 Intra-abdominal and pelvic swelling, mass and lump, unspecified site: Secondary | ICD-10-CM | POA: Diagnosis not present

## 2024-02-15 DIAGNOSIS — G2581 Restless legs syndrome: Secondary | ICD-10-CM | POA: Diagnosis not present

## 2024-02-15 DIAGNOSIS — I700 Atherosclerosis of aorta: Secondary | ICD-10-CM | POA: Diagnosis not present

## 2024-02-15 DIAGNOSIS — F419 Anxiety disorder, unspecified: Secondary | ICD-10-CM | POA: Diagnosis not present

## 2024-02-15 DIAGNOSIS — E1142 Type 2 diabetes mellitus with diabetic polyneuropathy: Secondary | ICD-10-CM | POA: Diagnosis not present

## 2024-02-15 MED ORDER — FLUCONAZOLE 100 MG PO TABS: 100.0000 mg | ORAL_TABLET | Freq: Every day | ORAL | 0 refills | Status: DC

## 2024-02-15 MED ORDER — NYSTATIN 100000 UNIT/GM EX POWD: 1.0000 | Freq: Three times a day (TID) | CUTANEOUS | 1 refills | Status: DC

## 2024-02-15 NOTE — Telephone Encounter (Signed)
 Copied from CRM [id number]. Topic: Clinical - Red Word Triage >> Feb 15, 2024 12:58 PM Eunice Blase wrote: Red Word that prompted transfer to Nurse Triage: Received call from [facility] per April pt is in pain with yeast on back, buttock area.   Chief Complaint: Yeast Infection Suspicion Symptoms: Scrotal Swelling, Pain, Discoloration Frequency: Ongoing since Hospital Discharge  Pertinent Negatives: Patient denies fever  Disposition: [] ED /[] Urgent Care (no appt availability in office) / [] Appointment(In office/virtual)/ []  [facility]/ [] Home Care/ [] Refused Recommended Disposition /[] [facility]/ [x]  Follow-up with PCP Additional Notes: Spoke with April with [facility], called in concerning a suspicion of a yeast infection with pain on his back and lower buttocks. Requesting for Diflucan to be sent in for the patient. Recently discharged from the Hospital on Last Wednesday. States the rash is on his back and buttocks and is painful. Offered an in office appointment for tomorrow, refused and stated there was no way he could come in because of the swelling to his scrotum. Requested to speak with Eber Jones, Dr. Renea Ee Nurse. Attempted to contact [facility], no answer.  Requesting Callback to April, [phone] to speak with Dr. Renea Ee Nurse.  Reason for Disposition  [1] Caller demands to speak with the PCP AND [2] about sick adult (or sick caller)  Answer Assessment - Initial Assessment Questions 1. SCROTAL SWELLING: "What does the scrotum look like?" "How swollen is it?" (mild, moderate severe; compare to other side)     Reddened  2. LOCATION: "Where is the swelling located?"     Whole Scrotum  3. ONSET: "When did the swelling start?"     Since Hospital Stay  4. PATTERN: "Does it come and go, or has it been constant since it started?"     Constant  5. SCROTAL PAIN: "Is there any pain?" If Yes, ask: "How bad is it?"  (Scale 1-10; or mild, moderate, severe)     Tender  6. HERNIA:  "Has a doctor ever told you that you have a hernia?"     No  7. OTHER SYMPTOMS: "Do you have any other symptoms?" (e.g., abdomen pain, difficulty passing urine, fever, vomiting)     No  Answer Assessment - Initial Assessment Questions 1. SITUATION:  Document reason for call.     Suspicion of Yeast infection, requesting Diflucan        3. ASSESSMENT: Document your nursing assessment.     Painful rash on the lower back and buttocks, discoloration noted on both areas, accompanied by scrotal swelling. Recently discharged from the hospital on Last Wednesday.   4. RESPONSE: Document what your response or recommendation was.     Offered the patient an appointment in office to see Dr. Sedalia Muta at the earliest availability.  Protocols used: Scrotum Swelling-A-AH, Difficult Call-A-AH

## 2024-02-15 NOTE — Telephone Encounter (Signed)
 Please advise   Copied from CRM [id number]. Topic: Clinical - Prescription Issue >> Feb 15, 2024 12:54 PM Eunice Blase wrote: Reason for CRM: Received call from [facility] per April ph: [phone] stated pt has yeast back, buttock and scrotum area. Please call in diflucan.

## 2024-02-15 NOTE — Telephone Encounter (Signed)
 I called Shawn Sullivan and let her know that Dr Sedalia Muta sent Diflucan prescription and Nystatin powder to the pharmacy. She asked to be sent to [facility] in [place].

## 2024-02-15 NOTE — Telephone Encounter (Signed)
 Pt wife calling, stating she missed call from office. At time of call no notes entered about anyone reaching out to pt about rx of diflucan.

## 2024-02-16 ENCOUNTER — Ambulatory Visit: Admitting: Family Medicine

## 2024-02-19 DIAGNOSIS — G894 Chronic pain syndrome: Secondary | ICD-10-CM | POA: Diagnosis not present

## 2024-02-19 DIAGNOSIS — I130 Hypertensive heart and chronic kidney disease with heart failure and stage 1 through stage 4 chronic kidney disease, or unspecified chronic kidney disease: Secondary | ICD-10-CM | POA: Diagnosis not present

## 2024-02-19 DIAGNOSIS — I480 Paroxysmal atrial fibrillation: Secondary | ICD-10-CM | POA: Diagnosis not present

## 2024-02-19 DIAGNOSIS — E66813 Obesity, class 3: Secondary | ICD-10-CM | POA: Diagnosis not present

## 2024-02-19 DIAGNOSIS — R161 Splenomegaly, not elsewhere classified: Secondary | ICD-10-CM | POA: Diagnosis not present

## 2024-02-19 DIAGNOSIS — I700 Atherosclerosis of aorta: Secondary | ICD-10-CM | POA: Diagnosis not present

## 2024-02-19 DIAGNOSIS — J449 Chronic obstructive pulmonary disease, unspecified: Secondary | ICD-10-CM | POA: Diagnosis not present

## 2024-02-19 DIAGNOSIS — N1832 Chronic kidney disease, stage 3b: Secondary | ICD-10-CM | POA: Diagnosis not present

## 2024-02-19 DIAGNOSIS — I5042 Chronic combined systolic (congestive) and diastolic (congestive) heart failure: Secondary | ICD-10-CM | POA: Diagnosis not present

## 2024-02-19 DIAGNOSIS — D696 Thrombocytopenia, unspecified: Secondary | ICD-10-CM | POA: Diagnosis not present

## 2024-02-19 DIAGNOSIS — N453 Epididymo-orchitis: Secondary | ICD-10-CM | POA: Diagnosis not present

## 2024-02-19 DIAGNOSIS — E441 Mild protein-calorie malnutrition: Secondary | ICD-10-CM | POA: Diagnosis not present

## 2024-02-19 DIAGNOSIS — F419 Anxiety disorder, unspecified: Secondary | ICD-10-CM | POA: Diagnosis not present

## 2024-02-19 DIAGNOSIS — E785 Hyperlipidemia, unspecified: Secondary | ICD-10-CM | POA: Diagnosis not present

## 2024-02-19 DIAGNOSIS — G4733 Obstructive sleep apnea (adult) (pediatric): Secondary | ICD-10-CM | POA: Diagnosis not present

## 2024-02-19 DIAGNOSIS — M545 Low back pain, unspecified: Secondary | ICD-10-CM | POA: Diagnosis not present

## 2024-02-19 DIAGNOSIS — F32A Depression, unspecified: Secondary | ICD-10-CM | POA: Diagnosis not present

## 2024-02-19 DIAGNOSIS — G2581 Restless legs syndrome: Secondary | ICD-10-CM | POA: Diagnosis not present

## 2024-02-19 DIAGNOSIS — N492 Inflammatory disorders of scrotum: Secondary | ICD-10-CM | POA: Diagnosis not present

## 2024-02-19 DIAGNOSIS — E876 Hypokalemia: Secondary | ICD-10-CM | POA: Diagnosis not present

## 2024-02-19 DIAGNOSIS — E1142 Type 2 diabetes mellitus with diabetic polyneuropathy: Secondary | ICD-10-CM | POA: Diagnosis not present

## 2024-02-19 DIAGNOSIS — E1122 Type 2 diabetes mellitus with diabetic chronic kidney disease: Secondary | ICD-10-CM | POA: Diagnosis not present

## 2024-02-19 DIAGNOSIS — R1900 Intra-abdominal and pelvic swelling, mass and lump, unspecified site: Secondary | ICD-10-CM | POA: Diagnosis not present

## 2024-02-19 DIAGNOSIS — J984 Other disorders of lung: Secondary | ICD-10-CM | POA: Diagnosis not present

## 2024-02-19 DIAGNOSIS — K219 Gastro-esophageal reflux disease without esophagitis: Secondary | ICD-10-CM | POA: Diagnosis not present

## 2024-02-22 ENCOUNTER — Encounter: Payer: Self-pay | Admitting: Medical Oncology

## 2024-02-22 ENCOUNTER — Other Ambulatory Visit

## 2024-02-22 ENCOUNTER — Other Ambulatory Visit: Payer: Self-pay | Admitting: Family Medicine

## 2024-02-23 ENCOUNTER — Telehealth: Payer: Self-pay | Admitting: Family Medicine

## 2024-02-23 NOTE — Telephone Encounter (Signed)
 [facility] POC Order [phone]

## 2024-02-25 DIAGNOSIS — G2581 Restless legs syndrome: Secondary | ICD-10-CM | POA: Diagnosis not present

## 2024-02-25 DIAGNOSIS — I130 Hypertensive heart and chronic kidney disease with heart failure and stage 1 through stage 4 chronic kidney disease, or unspecified chronic kidney disease: Secondary | ICD-10-CM | POA: Diagnosis not present

## 2024-02-25 DIAGNOSIS — K219 Gastro-esophageal reflux disease without esophagitis: Secondary | ICD-10-CM | POA: Diagnosis not present

## 2024-02-25 DIAGNOSIS — I480 Paroxysmal atrial fibrillation: Secondary | ICD-10-CM | POA: Diagnosis not present

## 2024-02-25 DIAGNOSIS — E1142 Type 2 diabetes mellitus with diabetic polyneuropathy: Secondary | ICD-10-CM | POA: Diagnosis not present

## 2024-02-25 DIAGNOSIS — I5042 Chronic combined systolic (congestive) and diastolic (congestive) heart failure: Secondary | ICD-10-CM | POA: Diagnosis not present

## 2024-02-25 DIAGNOSIS — N1832 Chronic kidney disease, stage 3b: Secondary | ICD-10-CM | POA: Diagnosis not present

## 2024-02-25 DIAGNOSIS — J449 Chronic obstructive pulmonary disease, unspecified: Secondary | ICD-10-CM | POA: Diagnosis not present

## 2024-02-25 DIAGNOSIS — E1122 Type 2 diabetes mellitus with diabetic chronic kidney disease: Secondary | ICD-10-CM | POA: Diagnosis not present

## 2024-02-25 DIAGNOSIS — E785 Hyperlipidemia, unspecified: Secondary | ICD-10-CM | POA: Diagnosis not present

## 2024-02-28 NOTE — Progress Notes (Signed)
 Rapid Diagnostic Clinic [facility] [phone]   Fax:([phone]) [phone]  INITIAL CONSULTATION:  Patient Care Team: Mercy Stall, MD as PCP - General (Family Medicine) Devon Fogo, [facility] (Inactive) (Pharmacist) Hassan Links, MD as Consulting Physician (Cardiology) Moishe Angel, PA-C as Physician Assistant (Physician Assistant) Allie Ivanoff, MD as Referring Physician (Dermatology) Clementine Cutting, MD as Consulting Physician (Nephrology) Annella Barrows, RN as Oncology Nurse Navigator (Medical Oncology)  CHIEF COMPLAINTS/PURPOSE OF CONSULTATION:  Retroperitoneal mass  HISTORY OF PRESENTING ILLNESS:  Shawn Sullivan 78 y.o. male with medical history significant for atrial fibrillation, CKD, COPD, diabetic polyneuropathy, hyperlipidemia, T2DM and OSA presents to the rapid diagnostic clinic for evaluation of large retroperitoneal mass. He is accompanied by his daughter for this visit.   On review of the previous records, Shawn Sullivan was admitted from 02/05/24- 02/09/24 for progressive scrotal swelling and pain over 2-3 days. He was diagnosed with scrotal cellulitis, orchitis and epididymitis. 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  On exam today, Shawn Sullivan reports his energy levels are improving since hospital discharge. He is no longer having any scrotal pain or infectious symptoms including fevers. He is no longer on antibiotic therapy. He has chronic low back pain that he rates 3 out of 10 on a pain scale. He denies nausea, vomiting or bowel habit changes. He denies any abdominal pain. He does bruise easily but denies any overt signs of bleeding. He has stable shortness of breath with exertion but none at  rest. He denies fevers, chills, sweats,chest pain or cough. He has no other complaints. Rest of the 10 point ROS is below.   MEDICAL HISTORY:  Past Medical History:  Diagnosis Date   Acquired thrombophilia (HCC) 12/24/2021   Acute cough 08/23/2023   Acute recurrent frontal sinusitis 08/23/2023   Anticoagulated 07/01/2018   Anticoagulated 07/01/2018   Aortic atherosclerosis (HCC) 02/06/2024   Atrial fibrillation, chronic (HCC) 05/31/2020   Benign essential hypertension 05/06/2013   Bilateral pseudophakia 12/16/2015   Cellulitis of scrotum 02/06/2024   Chronic combined systolic and diastolic congestive heart failure (HCC) 02/06/2024   Chronic obstructive pulmonary disease (HCC) 11/05/2015   Chronic pain syndrome 08/30/2017   CKD stage 3b, GFR 30-44 ml/min (HCC) 07/01/2018   Class 3 severe obesity due to excess calories with serious comorbidity and body mass index (BMI) of 40.0 to 44.9 in adult (HCC) 10/04/2017   COPD (chronic obstructive pulmonary disease) (HCC) 07/01/2018   Depressive disorder 05/06/2013   Dermatochalasis 12/16/2015   Diabetic polyneuropathy (HCC) 02/23/2020   Disorder of bursae and tendons in shoulder region 01/05/2014   Edema 11/04/2015   Formatting of this note might be different from the original.  proBNP is low, 83, Echo wo findings of heart failure     Encounter for immunization 09/12/2023   Encounter for screening for pneumonia 05/06/2023   Gastroesophageal reflux disease without esophagitis 02/23/2020   Generalized edema 06/22/2016   Hyperbilirubinemia 02/06/2024   Hyperlipidemia 12/16/2015   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 stage 3b chronic kidney disease (HCC) 06/14/2019   Hypertensive heart disease 07/01/2018   Low back pain 08/17/2013   Lumbosacral spondylosis 05/06/2013   Mild protein malnutrition (HCC) 02/06/2024   Obstructive sleep apnea 07/01/2018  On amiodarone therapy  03/24/2019   PAF (paroxysmal atrial fibrillation) (HCC) 07/01/2018   Persistent atrial fibrillation (HCC) 07/01/2018   Piriformis syndrome 05/03/2014   Postlaminectomy syndrome, lumbar region 05/06/2013   Presbyopia of both eyes 12/16/2015   Restless legs syndrome 03/23/2016   Restrictive lung disease 06/22/2016   Retroperitoneal mass 02/06/2024   Seasonal allergic rhinitis due to pollen 09/12/2023   SI (sacroiliac) pain 08/12/2020   Sleep apnea 05/06/2013   Splenomegaly 02/06/2024   Status post intraocular lens implant 12/16/2015   Thrombocytopenia (HCC) 02/06/2024   Type 2 diabetes mellitus without complications (HCC) 11/05/2015   Uncomplicated opioid dependence (HCC) 12/24/2021    SURGICAL HISTORY: Past Surgical History:  Procedure Laterality Date   APPENDECTOMY     BACK SURGERY     CARDIOVERSION N/A 12/01/2018   Procedure: CARDIOVERSION;  Surgeon: Luana Rumple, MD;  Location: [facility];  Service: Cardiovascular;  Laterality: N/A;   CATARACT EXTRACTION Bilateral    LUMBAR FUSION     NECK SURGERY      SOCIAL HISTORY: Social History   Socioeconomic History   Marital status: Married    Spouse name: Not on file   Number of children: Not on file   Years of education: Not on file   Highest education level: Not on file  Occupational History   Not on file  Tobacco Use   Smoking status: Former    Current packs/day: 0.00    Types: Cigarettes    Quit date: 53    Years since quitting: 32.3   Smokeless tobacco: Never  Vaping Use   Vaping status: Never Used  Substance and Sexual Activity   Alcohol use: Not Currently   Drug use: Not Currently    Types: Hydrocodone   Sexual activity: Not on file  Other Topics Concern   Not on file  Social History Narrative   Not on file   Social Drivers of Health   Financial Resource Strain: Low Risk  (08/12/2023)   Overall Financial Resource Strain (CARDIA)    Difficulty of Paying Living Expenses: Not hard at all  Food  Insecurity: No Food Insecurity (02/06/2024)   Hunger Vital Sign    Worried About Running Out of Food in the Last Year: Never true    Ran Out of Food in the Last Year: Never true  Transportation Needs: No Transportation Needs (02/06/2024)   PRAPARE - Administrator, Civil Service (Medical): No    Lack of Transportation (Non-Medical): No  Physical Activity: Inactive (08/12/2023)   Exercise Vital Sign    Days of Exercise per Week: 0 days    Minutes of Exercise per Session: 0 min  Stress: No Stress Concern Present (08/12/2023)   [employer] of Occupational Health - Occupational Stress Questionnaire    Feeling of Stress : Not at all  Social Connections: Socially Integrated (02/06/2024)   Social Connection and Isolation Panel [NHANES]    Frequency of Communication with Friends and Family: Three times a week    Frequency of Social Gatherings with Friends and Family: Once a week    Attends Religious Services: 1 to 4 times per year    Active Member of [employer] or Organizations: Yes    Attends Banker Meetings: 1 to 4 times per year    Marital Status: Married  Catering manager Violence: Not At Risk (02/06/2024)   Humiliation, Afraid, Rape, and Kick questionnaire    Fear of Current or Ex-Partner: No    Emotionally Abused: No  Physically Abused: No    Sexually Abused: No    FAMILY HISTORY: Family History  Problem Relation Age of Onset   Atrial fibrillation Mother    Cancer Mother        vaginal cancer   Heart disease Father     ALLERGIES:  is allergic to androgel [testosterone], biaxin [clarithromycin], duloxetine, gabapentin, ibuprofen, lyrica [pregabalin], and spironolactone.  MEDICATIONS:  Current Outpatient Medications  Medication Sig Dispense Refill   acetaminophen (TYLENOL) 500 MG tablet Take 1,000 mg by mouth every 6 (six) hours as needed for moderate pain or headache.     amiodarone (PACERONE) 200 MG tablet TAKE ONE TABLET BY MOUTH EVERY EVENING 90 tablet  3   apixaban (ELIQUIS) 5 MG TABS tablet Take 1 tablet (5 mg total) by mouth 2 (two) times daily. 28 tablet 0   Budeson-Glycopyrrol-Formoterol (BREZTRI AEROSPHERE) 160-9-4.8 MCG/ACT AERO Inhale 2 puffs into the lungs 2 (two) times daily. 10.7 g 3   cholecalciferol (VITAMIN D3) 25 MCG (1000 UT) tablet Take 1,000 Units by mouth daily.      dapagliflozin propanediol (FARXIGA) 10 MG TABS tablet Take 1 tablet (10 mg total) by mouth daily before breakfast. 90 tablet 3   fluconazole (DIFLUCAN) 100 MG tablet Take 1 tablet (100 mg total) by mouth daily. 7 tablet 0   HYDROcodone-acetaminophen (NORCO) 10-325 MG tablet Take 1-2 tablets by mouth 2 (two) times daily as needed for moderate pain (pain score 4-6).     icosapent Ethyl (VASCEPA) 1 g capsule Take 2 capsules (2 g total) by mouth 2 (two) times daily. (Patient taking differently: Take 1 g by mouth 2 (two) times daily.) 120 capsule 2   insulin degludec (TRESIBA FLEXTOUCH) 100 UNIT/ML FlexTouch Pen Inject 60 Units into the skin every evening.     ipratropium-albuterol (DUONEB) 0.5-2.5 (3) MG/3ML SOLN INHALE CONTENTS OF 1 VIAL VIA NEBULIZER EVERY 6 HOURS AS NEEDED FOR SHORTNESS OF BREATH 360 mL 10   levocetirizine (XYZAL) 5 MG tablet Take 5 mg by mouth daily as needed for allergies.     metolazone (ZAROXOLYN) 5 MG tablet Take 5 mg by mouth once a week. May take an additional 5 mg tablet for swelling if needed.     metoprolol tartrate (LOPRESSOR) 50 MG tablet TAKE 1 TABLET BY MOUTH EVERY MORNING, TAKE 1 TABLET IN THE EVENING AND TAKE 1 TABLET AT BEDTIME 90 tablet 6   montelukast (SINGULAIR) 10 MG tablet TAKE 1 TABLET BY MOUTH EVERY DAY AT BEDTIME 30 tablet 10   nystatin (MYCOSTATIN/NYSTOP) powder Apply 1 Application topically 3 (three) times daily. 30 g 1   Omeprazole 20 MG TBEC Take 20 mg by mouth 2 (two) times daily before a meal.      potassium chloride SA (KLOR-CON M) 20 MEQ tablet TAKE 2 TABLETS BY MOUTH AT BREAKFAST AND AT BEDTIME 120 tablet 10    rOPINIRole (REQUIP) 1 MG tablet Take 1 tablet (1 mg total) by mouth at bedtime. (Patient taking differently: Take 1 mg by mouth at bedtime as needed (restless legs).) 30 tablet 2   rosuvastatin (CRESTOR) 40 MG tablet TAKE 1 TABLET BY MOUTH EVERY DAY AT BEDTIME 30 tablet 10   Semaglutide, 1 MG/DOSE, (OZEMPIC, 1 MG/DOSE,) 2 MG/1.5ML SOPN Inject 1 mg into the skin once a week. 9 mL 3   sertraline (ZOLOFT) 50 MG tablet TAKE 1 TABLET BY MOUTH AT BREAKFAST AND AT BEDTIME 60 tablet 10   tamsulosin (FLOMAX) 0.4 MG CAPS capsule TAKE 1 CAPSULE BY MOUTH EVERY  DAY AT BEDTIME 30 capsule 10   tiZANidine (ZANAFLEX) 2 MG tablet TAKE 1 TABLET BY MOUTH EVERY DAY AT BEDTIME 30 tablet 10   torsemide (DEMADEX) 20 MG tablet TAKE TWO (2) TABLETS BY MOUTH EACH MORNING AND TAKE 2 TABLETS AT NOON 120 tablet 10   VENTOLIN HFA 108 (90 Base) MCG/ACT inhaler INHALE 2 PUFFS BY MOUTH EVERY 4 HOURS AS NEEDED FOR SHORTNESS OF BREATH AND WHEEZING 18 g 10   No current facility-administered medications for this visit.    REVIEW OF SYSTEMS:   Constitutional: ( - ) fevers, ( - )  chills , ( - ) night sweats Eyes: ( - ) blurriness of vision, ( - ) double vision, ( - ) watery eyes Ears, nose, mouth, throat, and face: ( - ) mucositis, ( - ) sore throat Respiratory: ( - ) cough, ( - ) dyspnea, ( - ) wheezes Cardiovascular: ( - ) palpitation, ( - ) chest discomfort, ( - ) lower extremity swelling Gastrointestinal:  ( - ) nausea, ( - ) heartburn, ( - ) change in bowel habits Skin: ( - ) abnormal skin rashes Lymphatics: ( - ) new lymphadenopathy, ( - ) easy bruising Neurological: ( - ) numbness, ( - ) tingling, ( - ) new weaknesses Behavioral/Psych: ( - ) mood change, ( - ) new changes  All other systems were reviewed with the patient and are negative.  PHYSICAL EXAMINATION: ECOG PERFORMANCE STATUS: 2 - Symptomatic, <50% confined to bed  Vitals:   02/29/24 1206  BP: 126/83  Pulse: (!) 102  Resp: 17  Temp: 98.1 F (36.7 C)   SpO2: 92%   Filed Weights   02/29/24 1206  Weight: (!) 323 lb 3.2 oz (146.6 kg)    GENERAL: chronically ill appearing male in NAD  SKIN: skin color, texture, turgor are normal, no rashes or significant lesions EYES: conjunctiva are pink and non-injected, sclera clear OROPHARYNX: no exudate, no erythema; lips, buccal mucosa, and tongue normal  NECK: supple, non-tender LYMPH:  no palpable lymphadenopathy in the cervical or supraclavicular lymph nodes.  LUNGS: clear to auscultation and percussion with normal breathing effort HEART: regular rate & rhythm and no murmurs and no lower extremity edema ABDOMEN: diffuse distention, non-tender, normal bowel sounds Musculoskeletal: no cyanosis of digits and no clubbing  PSYCH: alert & oriented x 3, fluent speech NEURO: no focal motor/sensory deficits  LABORATORY DATA:  I have reviewed the data as listed    Latest Ref Rng & Units 02/29/2024    1:11 PM 02/09/2024    4:54 AM 02/08/2024    4:21 AM  CBC  WBC 4.0 - 10.5 K/uL 6.8  8.7  8.4   Hemoglobin 13.0 - 17.0 g/dL 16.1  09.6  04.5   Hematocrit 39.0 - 52.0 % 46.1  46.4  46.4   Platelets 150 - 400 K/uL 133  178  158        Latest Ref Rng & Units 02/29/2024    1:11 PM 02/09/2024    4:54 AM 02/08/2024    4:21 AM  CMP  Glucose 70 - 99 mg/dL 409  811  914   BUN 8 - 23 mg/dL 14  20  18    Creatinine 0.61 - 1.24 mg/dL 7.82  9.56  2.13   Sodium 135 - 145 mmol/L 142  140  136   Potassium 3.5 - 5.1 mmol/L 4.4  3.5  3.4   Chloride 98 - 111 mmol/L 105  104  105  CO2 22 - 32 mmol/L 30  26  23    Calcium 8.9 - 10.3 mg/dL 16.1  9.1  8.8   Total Protein 6.5 - 8.1 g/dL 6.6  6.3  6.2   Total Bilirubin 0.0 - 1.2 mg/dL 0.8  1.1  1.2   Alkaline Phos 38 - 126 U/L 105  94  89   AST 15 - 41 U/L 40  21  21   ALT 0 - 44 U/L 29  17  18       RADIOGRAPHIC STUDIES: I have personally reviewed the radiological images as listed and agreed with the findings in the report. No results found.   ASSESSMENT &  PLAN Shawn Sullivan is a 78 y.o. male who presents to the rapid diagnostic clinic for evaluation of large retroperitoneal mass.   #Right retroperitoneal mass: --Seen on CT abdomen/pelvis from 02/06/2024 measuring 17.1 x 19.7 x 19.3 cm in greatest dimension. Findings are strongly suspicious for liposarcoma.  --Recommend labs today to check CBC, CMP and LDH levels --Recommend IR biopsy to confirm tissue diagnosis --RTC once workup is complete  Orders Placed This Encounter  Procedures   US  BIOPSY (ABDOMINAL RETROPERTIONEAL MASS)    Standing Status:   Future    Expected Date:   03/01/2024    Expiration Date:   02/28/2025    Lab orders requested (DO NOT place separate lab orders, these will be automatically ordered during procedure specimen collection)::   Surgical Pathology    Reason for Exam (SYMPTOM  OR DIAGNOSIS REQUIRED):   retroperitoneal mass    Preferred location?:   [facility]   CBC with Differential (Cancer Center Only)    Standing Status:   Future    Number of Occurrences:   1    Expiration Date:   02/28/2025   CMP (Cancer Center only)    Standing Status:   Future    Number of Occurrences:   1    Expiration Date:   02/28/2025   Lactate dehydrogenase (LDH)    Standing Status:   Future    Number of Occurrences:   1    Expiration Date:   02/28/2025    All questions were answered. The patient knows to call the clinic with any problems, questions or concerns.  I have spent a total of 60 minutes minutes of face-to-face and non-face-to-face time, preparing to see the patient, obtaining and/or reviewing separately obtained history, performing a medically appropriate examination, counseling and educating the patient, ordering medications/tests/procedures, referring and communicating with other health care professionals, documenting clinical information in the electronic health record, independently interpreting results and communicating results to the patient, and care coordination.    Wyline Hearing, PA-C Department of Hematology/Oncology [facility] Phone: [phone]  Patient was seen with Dr. Rosaline Coma.   I have read the above note and personally examined the patient. I agree with the assessment and plan as noted above.  Briefly Shawn Sullivan is an 78 year old male who presents for evaluation of a retroperitoneal mass, concerning for sarcoma.  Patient underwent a CT scan on 02/06/2024 which showed a large lipomatous mass in the retroperitoneum, measured 17.1 x 19.7 x 19.3 cm.  Due to concern for this finding the patient was referred to hematology for further evaluation management.  At this time would recommend a CT biopsy of the lesion in order to determine if this mass is malignant or represents a benign lesion.  No other sites of disease elsewhere in  the body.  Pending the results of the biopsy we can determine if the patient needs care at a tertiary care center for sarcoma.  The patient voiced understanding of our findings and plan moving forward.   Rogerio Clay, MD Department of Hematology/Oncology [facility] Phone: [phone] Pager: [phone] Email: [email or web address]

## 2024-02-29 ENCOUNTER — Inpatient Hospital Stay: Attending: Physician Assistant | Admitting: Physician Assistant

## 2024-02-29 ENCOUNTER — Inpatient Hospital Stay

## 2024-02-29 ENCOUNTER — Encounter: Payer: Self-pay | Admitting: Physician Assistant

## 2024-02-29 ENCOUNTER — Encounter: Payer: Self-pay | Admitting: Medical Oncology

## 2024-02-29 VITALS — BP 126/83 | HR 102 | Temp 98.1°F | Resp 17 | Wt 323.2 lb

## 2024-02-29 DIAGNOSIS — R1900 Intra-abdominal and pelvic swelling, mass and lump, unspecified site: Secondary | ICD-10-CM

## 2024-02-29 DIAGNOSIS — R1909 Other intra-abdominal and pelvic swelling, mass and lump: Secondary | ICD-10-CM | POA: Insufficient documentation

## 2024-02-29 DIAGNOSIS — Z87891 Personal history of nicotine dependence: Secondary | ICD-10-CM | POA: Diagnosis not present

## 2024-02-29 DIAGNOSIS — Z8049 Family history of malignant neoplasm of other genital organs: Secondary | ICD-10-CM | POA: Diagnosis not present

## 2024-02-29 LAB — CMP (CANCER CENTER ONLY)
ALT: 29 U/L (ref 0–44)
AST: 40 U/L (ref 15–41)
Albumin: 3.5 g/dL (ref 3.5–5.0)
Alkaline Phosphatase: 105 U/L (ref 38–126)
Anion gap: 7 (ref 5–15)
BUN: 14 mg/dL (ref 8–23)
CO2: 30 mmol/L (ref 22–32)
Calcium: 10 mg/dL (ref 8.9–10.3)
Chloride: 105 mmol/L (ref 98–111)
Creatinine: 1.43 mg/dL — ABNORMAL HIGH (ref 0.61–1.24)
GFR, Estimated: 50 mL/min — ABNORMAL LOW (ref >60)
Glucose, Bld: 109 mg/dL — ABNORMAL HIGH (ref 70–99)
Potassium: 4.4 mmol/L (ref 3.5–5.1)
Sodium: 142 mmol/L (ref 135–145)
Total Bilirubin: 0.8 mg/dL (ref 0.0–1.2)
Total Protein: 6.6 g/dL (ref 6.5–8.1)

## 2024-02-29 LAB — CBC WITH DIFFERENTIAL (CANCER CENTER ONLY)
Abs Immature Granulocytes: 0.02 10*3/uL (ref 0.00–0.07)
Basophils Absolute: 0 10*3/uL (ref 0.0–0.1)
Basophils Relative: 1 %
Eosinophils Absolute: 0.3 10*3/uL (ref 0.0–0.5)
Eosinophils Relative: 4 %
HCT: 46.1 % (ref 39.0–52.0)
Hemoglobin: 14.5 g/dL (ref 13.0–17.0)
Immature Granulocytes: 0 %
Lymphocytes Relative: 21 %
Lymphs Abs: 1.4 10*3/uL (ref 0.7–4.0)
MCH: 29.5 pg (ref 26.0–34.0)
MCHC: 31.5 g/dL (ref 30.0–36.0)
MCV: 93.7 fL (ref 80.0–100.0)
Monocytes Absolute: 0.6 10*3/uL (ref 0.1–1.0)
Monocytes Relative: 9 %
Neutro Abs: 4.5 10*3/uL (ref 1.7–7.7)
Neutrophils Relative %: 65 %
Platelet Count: 133 10*3/uL — ABNORMAL LOW (ref 150–400)
RBC: 4.92 MIL/uL (ref 4.22–5.81)
RDW: 15.7 % — ABNORMAL HIGH (ref 11.5–15.5)
Smear Review: NORMAL
WBC Count: 6.8 10*3/uL (ref 4.0–10.5)
nRBC: 0 % (ref 0.0–0.2)

## 2024-02-29 LAB — LACTATE DEHYDROGENASE: LDH: 150 U/L (ref 98–192)

## 2024-02-29 NOTE — Progress Notes (Signed)
 Rapid Diagnostic Clinic  Patient presented to clinic for his scheduled appointment with Shawn Sullivan. Patient's daughter, Shawn Sullivan is present with patient. I introduced myself and provided patient and daughter with my direct contact information. They were both encouraged to call me with questions/concerns.  Gregary Cromer, RN, BSN, [facility] Oncology Nurse Navigator, Rapid Diagnostic Clinic 02/29/2024 12:56 PM

## 2024-02-29 NOTE — Patient Instructions (Signed)
 Diagnostic Clinic Office Visit Discharge Information and Instructions  Thank you for choosing [facility] for your healthcare needs.  Below is a summary of today's discussion, along with our contact information and an outline of what to expect next.  Reason for Visit:  Retroperitoneal mass  Proposed Diagnostic Care Plan: Labs today Core needle biopsy of retroperitoneal mass  What to Expect: - Generally, when lab tests are ordered the results can take up to 1 week for results to be available.  At that point, we will contact you to discuss your results with you.  Unless there is a critical result, we will typically wait for all of your lab results to be available before contacting you. - If a biopsy is part of your Care Plan, those results can take on average 7-10 days to result.  Once results are available, we will contact you to discuss your pathology results and any next steps. - If you have additional imaging ordered, such as a CT Scan, MRI, Ultrasound, Bone Scan, or PET scan, your imaging will need to be authorized then scheduled with the earliest available appointment.  You may be asked to travel to another hospital within [facility] who has a sooner availability, please consider doing so if asked. - If you use MyChart, your results will be available to you in the MyChart portal.  Your provider will be in touch with you as soon as all of your results are available to be discussed.  Your Diagnostic Clinic Provider:  Georga Kaufmann PA-C and Dr. Jeanie Sewer, contact number [phone] Your Diagnostic Navigator:  Chauncy Lean, RN, contact number [phone].  If you or your caregiver have number blocking on your cell phones, please ensure the cancer center's numbers are not blocked.  If you are not a registered MyChart user, please consider enrolling in MyChart to receive your test results and visit notes.  You can also access your discharge instructions electronically.  MyChart also  gives you an electronic means to communicate with your Care Team instead of needing to call in to the cancer center.  We appreciate you trusting [place] with your healthcare and look forward to partnering with you as we work to uncover what your potential diagnosis may be.  Please do not hesitate to reach out at any point with questions or concerns.

## 2024-02-29 NOTE — Progress Notes (Signed)
 PROCEDURE / BIOPSY REVIEW Date: 02/29/24  Requested Biopsy site: RP Reason for request: mass Imaging review: Best seen on CT AP   Decision: Approved Imaging modality to perform: Ultrasound Schedule with: Moderate Sedation Schedule for: Any VIR  Additional comments:  Rapid Diagnostic Clinic [facility]) Pt. Expedite request per referring provider. @VIR : Large lipomatous RP mass, Q liposarc.  @Schedulers . [place] R RP mass Bx.   Please contact me with questions, concerns, or if issue pertaining to this request arise.  Roanna Banning, MD [facility]

## 2024-03-01 ENCOUNTER — Encounter: Payer: Self-pay | Admitting: Physician Assistant

## 2024-03-03 DIAGNOSIS — R161 Splenomegaly, not elsewhere classified: Secondary | ICD-10-CM | POA: Diagnosis not present

## 2024-03-03 DIAGNOSIS — E441 Mild protein-calorie malnutrition: Secondary | ICD-10-CM | POA: Diagnosis not present

## 2024-03-03 DIAGNOSIS — E66813 Obesity, class 3: Secondary | ICD-10-CM | POA: Diagnosis not present

## 2024-03-03 DIAGNOSIS — N453 Epididymo-orchitis: Secondary | ICD-10-CM | POA: Diagnosis not present

## 2024-03-03 DIAGNOSIS — K219 Gastro-esophageal reflux disease without esophagitis: Secondary | ICD-10-CM | POA: Diagnosis not present

## 2024-03-03 DIAGNOSIS — J984 Other disorders of lung: Secondary | ICD-10-CM | POA: Diagnosis not present

## 2024-03-03 DIAGNOSIS — G4733 Obstructive sleep apnea (adult) (pediatric): Secondary | ICD-10-CM | POA: Diagnosis not present

## 2024-03-03 DIAGNOSIS — F419 Anxiety disorder, unspecified: Secondary | ICD-10-CM | POA: Diagnosis not present

## 2024-03-03 DIAGNOSIS — R1900 Intra-abdominal and pelvic swelling, mass and lump, unspecified site: Secondary | ICD-10-CM | POA: Diagnosis not present

## 2024-03-03 DIAGNOSIS — J449 Chronic obstructive pulmonary disease, unspecified: Secondary | ICD-10-CM | POA: Diagnosis not present

## 2024-03-03 DIAGNOSIS — E785 Hyperlipidemia, unspecified: Secondary | ICD-10-CM | POA: Diagnosis not present

## 2024-03-03 DIAGNOSIS — E1142 Type 2 diabetes mellitus with diabetic polyneuropathy: Secondary | ICD-10-CM | POA: Diagnosis not present

## 2024-03-03 DIAGNOSIS — I480 Paroxysmal atrial fibrillation: Secondary | ICD-10-CM | POA: Diagnosis not present

## 2024-03-03 DIAGNOSIS — I5042 Chronic combined systolic (congestive) and diastolic (congestive) heart failure: Secondary | ICD-10-CM | POA: Diagnosis not present

## 2024-03-03 DIAGNOSIS — N492 Inflammatory disorders of scrotum: Secondary | ICD-10-CM | POA: Diagnosis not present

## 2024-03-03 DIAGNOSIS — G2581 Restless legs syndrome: Secondary | ICD-10-CM | POA: Diagnosis not present

## 2024-03-03 DIAGNOSIS — I700 Atherosclerosis of aorta: Secondary | ICD-10-CM | POA: Diagnosis not present

## 2024-03-03 DIAGNOSIS — F32A Depression, unspecified: Secondary | ICD-10-CM | POA: Diagnosis not present

## 2024-03-03 DIAGNOSIS — E876 Hypokalemia: Secondary | ICD-10-CM | POA: Diagnosis not present

## 2024-03-03 DIAGNOSIS — D696 Thrombocytopenia, unspecified: Secondary | ICD-10-CM | POA: Diagnosis not present

## 2024-03-03 DIAGNOSIS — G894 Chronic pain syndrome: Secondary | ICD-10-CM | POA: Diagnosis not present

## 2024-03-03 DIAGNOSIS — I130 Hypertensive heart and chronic kidney disease with heart failure and stage 1 through stage 4 chronic kidney disease, or unspecified chronic kidney disease: Secondary | ICD-10-CM | POA: Diagnosis not present

## 2024-03-03 DIAGNOSIS — E1122 Type 2 diabetes mellitus with diabetic chronic kidney disease: Secondary | ICD-10-CM | POA: Diagnosis not present

## 2024-03-03 DIAGNOSIS — M545 Low back pain, unspecified: Secondary | ICD-10-CM | POA: Diagnosis not present

## 2024-03-03 DIAGNOSIS — N1832 Chronic kidney disease, stage 3b: Secondary | ICD-10-CM | POA: Diagnosis not present

## 2024-03-08 ENCOUNTER — Other Ambulatory Visit (HOSPITAL_COMMUNITY): Payer: Self-pay

## 2024-03-13 ENCOUNTER — Other Ambulatory Visit: Payer: Self-pay | Admitting: Radiology

## 2024-03-13 DIAGNOSIS — Z01812 Encounter for preprocedural laboratory examination: Secondary | ICD-10-CM

## 2024-03-14 ENCOUNTER — Encounter: Payer: Self-pay | Admitting: Medical Oncology

## 2024-03-14 ENCOUNTER — Ambulatory Visit

## 2024-03-14 ENCOUNTER — Other Ambulatory Visit: Payer: Self-pay | Admitting: Physician Assistant

## 2024-03-14 DIAGNOSIS — J449 Chronic obstructive pulmonary disease, unspecified: Secondary | ICD-10-CM | POA: Diagnosis not present

## 2024-03-14 DIAGNOSIS — N453 Epididymo-orchitis: Secondary | ICD-10-CM | POA: Diagnosis not present

## 2024-03-14 DIAGNOSIS — F419 Anxiety disorder, unspecified: Secondary | ICD-10-CM | POA: Diagnosis not present

## 2024-03-14 DIAGNOSIS — G894 Chronic pain syndrome: Secondary | ICD-10-CM | POA: Diagnosis not present

## 2024-03-14 DIAGNOSIS — I130 Hypertensive heart and chronic kidney disease with heart failure and stage 1 through stage 4 chronic kidney disease, or unspecified chronic kidney disease: Secondary | ICD-10-CM | POA: Diagnosis not present

## 2024-03-14 DIAGNOSIS — R1900 Intra-abdominal and pelvic swelling, mass and lump, unspecified site: Secondary | ICD-10-CM

## 2024-03-14 DIAGNOSIS — N492 Inflammatory disorders of scrotum: Secondary | ICD-10-CM | POA: Diagnosis not present

## 2024-03-14 DIAGNOSIS — K219 Gastro-esophageal reflux disease without esophagitis: Secondary | ICD-10-CM | POA: Diagnosis not present

## 2024-03-14 DIAGNOSIS — E1122 Type 2 diabetes mellitus with diabetic chronic kidney disease: Secondary | ICD-10-CM | POA: Diagnosis not present

## 2024-03-14 DIAGNOSIS — E441 Mild protein-calorie malnutrition: Secondary | ICD-10-CM | POA: Diagnosis not present

## 2024-03-14 DIAGNOSIS — E876 Hypokalemia: Secondary | ICD-10-CM | POA: Diagnosis not present

## 2024-03-14 DIAGNOSIS — N1832 Chronic kidney disease, stage 3b: Secondary | ICD-10-CM | POA: Diagnosis not present

## 2024-03-14 DIAGNOSIS — E785 Hyperlipidemia, unspecified: Secondary | ICD-10-CM | POA: Diagnosis not present

## 2024-03-14 DIAGNOSIS — E66813 Obesity, class 3: Secondary | ICD-10-CM | POA: Diagnosis not present

## 2024-03-14 DIAGNOSIS — J984 Other disorders of lung: Secondary | ICD-10-CM | POA: Diagnosis not present

## 2024-03-14 DIAGNOSIS — I5042 Chronic combined systolic (congestive) and diastolic (congestive) heart failure: Secondary | ICD-10-CM | POA: Diagnosis not present

## 2024-03-14 DIAGNOSIS — I700 Atherosclerosis of aorta: Secondary | ICD-10-CM | POA: Diagnosis not present

## 2024-03-14 DIAGNOSIS — R161 Splenomegaly, not elsewhere classified: Secondary | ICD-10-CM | POA: Diagnosis not present

## 2024-03-14 DIAGNOSIS — G4733 Obstructive sleep apnea (adult) (pediatric): Secondary | ICD-10-CM | POA: Diagnosis not present

## 2024-03-14 DIAGNOSIS — M545 Low back pain, unspecified: Secondary | ICD-10-CM | POA: Diagnosis not present

## 2024-03-14 DIAGNOSIS — D696 Thrombocytopenia, unspecified: Secondary | ICD-10-CM | POA: Diagnosis not present

## 2024-03-14 DIAGNOSIS — E1142 Type 2 diabetes mellitus with diabetic polyneuropathy: Secondary | ICD-10-CM | POA: Diagnosis not present

## 2024-03-14 DIAGNOSIS — G2581 Restless legs syndrome: Secondary | ICD-10-CM | POA: Diagnosis not present

## 2024-03-14 DIAGNOSIS — F32A Depression, unspecified: Secondary | ICD-10-CM | POA: Diagnosis not present

## 2024-03-14 DIAGNOSIS — I480 Paroxysmal atrial fibrillation: Secondary | ICD-10-CM | POA: Diagnosis not present

## 2024-03-16 ENCOUNTER — Telehealth: Payer: Self-pay | Admitting: Hematology and Oncology

## 2024-03-16 ENCOUNTER — Encounter: Payer: Self-pay | Admitting: Medical Oncology

## 2024-03-16 NOTE — Progress Notes (Signed)
 Patient for CT retroperitoneal mass bx on Mon 03/20/24, I called and spoke with the patient's daughter, Carmelina Chinchilla on the phone and gave pre-procedure instructions. Carmelina Chinchilla was made aware to have the patient here at 12p, last dose of Eliquis was Friday 03/17/24, NPO after MN prior to procedure as well as driver post procedure/recovery/discharge. Carmelina Chinchilla stated understanding.  Called 03/14/24

## 2024-03-16 NOTE — Progress Notes (Signed)
 Rapid Diagnostic Clinic  Patient's clinic visit rescheduled to one week after biopsy, after daughter called concerned inability to make both appointments on the same day. Scheduling adjusted patient's schedule and informed daughter of new appointment.   Esperanza Hedges, RN, BSN, [facility] Oncology Nurse Navigator, Rapid Diagnostic Clinic 03/16/2024 11:00 AM

## 2024-03-17 ENCOUNTER — Other Ambulatory Visit: Payer: Self-pay | Admitting: Physician Assistant

## 2024-03-17 DIAGNOSIS — Z01818 Encounter for other preprocedural examination: Secondary | ICD-10-CM

## 2024-03-20 ENCOUNTER — Other Ambulatory Visit: Payer: Self-pay

## 2024-03-20 ENCOUNTER — Ambulatory Visit: Admitting: Hematology and Oncology

## 2024-03-20 ENCOUNTER — Other Ambulatory Visit

## 2024-03-20 ENCOUNTER — Ambulatory Visit
Admission: RE | Admit: 2024-03-20 | Discharge: 2024-03-20 | Disposition: A | Discharge status: Home / Self Care | Source: Ambulatory Visit | Attending: Physician Assistant

## 2024-03-20 DIAGNOSIS — Z794 Long term (current) use of insulin: Secondary | ICD-10-CM | POA: Diagnosis not present

## 2024-03-20 DIAGNOSIS — J449 Chronic obstructive pulmonary disease, unspecified: Secondary | ICD-10-CM | POA: Insufficient documentation

## 2024-03-20 DIAGNOSIS — E1122 Type 2 diabetes mellitus with diabetic chronic kidney disease: Secondary | ICD-10-CM | POA: Diagnosis not present

## 2024-03-20 DIAGNOSIS — N1832 Chronic kidney disease, stage 3b: Secondary | ICD-10-CM | POA: Diagnosis not present

## 2024-03-20 DIAGNOSIS — I5042 Chronic combined systolic (congestive) and diastolic (congestive) heart failure: Secondary | ICD-10-CM | POA: Insufficient documentation

## 2024-03-20 DIAGNOSIS — Z87891 Personal history of nicotine dependence: Secondary | ICD-10-CM | POA: Diagnosis not present

## 2024-03-20 DIAGNOSIS — K689 Other disorders of retroperitoneum: Secondary | ICD-10-CM | POA: Diagnosis not present

## 2024-03-20 DIAGNOSIS — Z01818 Encounter for other preprocedural examination: Secondary | ICD-10-CM | POA: Diagnosis not present

## 2024-03-20 DIAGNOSIS — I4819 Other persistent atrial fibrillation: Secondary | ICD-10-CM | POA: Insufficient documentation

## 2024-03-20 DIAGNOSIS — R1909 Other intra-abdominal and pelvic swelling, mass and lump: Secondary | ICD-10-CM | POA: Diagnosis not present

## 2024-03-20 DIAGNOSIS — Z7901 Long term (current) use of anticoagulants: Secondary | ICD-10-CM | POA: Diagnosis not present

## 2024-03-20 DIAGNOSIS — Z7985 Long-term (current) use of injectable non-insulin antidiabetic drugs: Secondary | ICD-10-CM | POA: Insufficient documentation

## 2024-03-20 DIAGNOSIS — Z01812 Encounter for preprocedural laboratory examination: Secondary | ICD-10-CM

## 2024-03-20 DIAGNOSIS — I130 Hypertensive heart and chronic kidney disease with heart failure and stage 1 through stage 4 chronic kidney disease, or unspecified chronic kidney disease: Secondary | ICD-10-CM | POA: Diagnosis not present

## 2024-03-20 DIAGNOSIS — Z7984 Long term (current) use of oral hypoglycemic drugs: Secondary | ICD-10-CM | POA: Insufficient documentation

## 2024-03-20 DIAGNOSIS — R1900 Intra-abdominal and pelvic swelling, mass and lump, unspecified site: Secondary | ICD-10-CM | POA: Insufficient documentation

## 2024-03-20 LAB — CBC
HCT: 46 % (ref 39.0–52.0)
Hemoglobin: 14.7 g/dL (ref 13.0–17.0)
MCH: 29.8 pg (ref 26.0–34.0)
MCHC: 32 g/dL (ref 30.0–36.0)
MCV: 93.1 fL (ref 80.0–100.0)
Platelets: 153 10*3/uL (ref 150–400)
RBC: 4.94 MIL/uL (ref 4.22–5.81)
RDW: 15.6 % — ABNORMAL HIGH (ref 11.5–15.5)
WBC: 8.9 10*3/uL (ref 4.0–10.5)
nRBC: 0 % (ref 0.0–0.2)

## 2024-03-20 LAB — GLUCOSE, CAPILLARY
Glucose-Capillary: 131 mg/dL — ABNORMAL HIGH (ref 70–99)
Glucose-Capillary: 140 mg/dL — ABNORMAL HIGH (ref 70–99)

## 2024-03-20 LAB — PROTIME-INR
INR: 1 (ref 0.8–1.2)
Prothrombin Time: 13.5 s (ref 11.4–15.2)

## 2024-03-20 MED ORDER — MIDAZOLAM HCL 2 MG/2ML IJ SOLN
INTRAMUSCULAR | Status: AC
  Filled 2024-03-20: qty 4

## 2024-03-20 MED ORDER — FENTANYL CITRATE (PF) 100 MCG/2ML IJ SOLN
INTRAMUSCULAR | Status: DC
Start: 2024-03-20 — End: 2024-03-21
  Filled 2024-03-20: qty 2

## 2024-03-20 MED ORDER — SODIUM CHLORIDE 0.9 % IV SOLN
INTRAVENOUS | Status: DC
Start: 2024-03-20 — End: 2024-03-20

## 2024-03-20 MED ORDER — FENTANYL CITRATE (PF) 100 MCG/2ML IJ SOLN
INTRAMUSCULAR | Status: AC | PRN
  Administered 2024-03-20 (×2): 50 ug via INTRAVENOUS

## 2024-03-20 MED ORDER — LIDOCAINE 1 % OPTIME INJ - NO CHARGE
10.0000 mL | Freq: Once | INTRAMUSCULAR | Status: DC
  Filled 2024-03-20: qty 10

## 2024-03-20 MED ORDER — MIDAZOLAM HCL 2 MG/2ML IJ SOLN
INTRAMUSCULAR | Status: AC | PRN
Start: 2024-03-20 — End: 2024-03-20
  Administered 2024-03-20 (×2): 1 mg via INTRAVENOUS

## 2024-03-20 MED ORDER — SODIUM CHLORIDE 0.9 % IV SOLN: INTRAVENOUS | Status: DC

## 2024-03-20 MED ORDER — FENTANYL CITRATE (PF) 100 MCG/2ML IJ SOLN
INTRAMUSCULAR | Status: AC
  Filled 2024-03-20: qty 2

## 2024-03-20 NOTE — Procedures (Signed)
 Interventional Radiology Procedure Note  Procedure:  CT guided biopsy of right RP mass.   Complications: None EBL: None  Recommendations: - Bedrest 1 hours.   - Routine wound care - Follow up pathology - Advance diet   Signed,  Myrlene Asper, DO

## 2024-03-20 NOTE — H&P (Signed)
 Chief Complaint: Retroperitoneal mass; request for image guided retroperitoneal mass biopsy  Referring Provider(s): Darilyn Edin   Supervising Physician: Myrlene Asper  Patient Status: [facility] - Out-pt  History of Present Illness: Shawn Sullivan is a 78 y.o. male with PMH significant for CKD, afib (eliquis ), DM II, COPD, HTN, OSA, and thrombocytopenia. 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  He was referred to IR for biopsy of the mass to determine treatment needs.   He has held eliquis  since Friday. His wife and daughter are bedside for interview, will be with patient for 24 hrs. Denies fever, chills, CP, abd pain, leg swelling, N/V. Endorses baseline level of SOB. He does not wear the CPAP prescribed to him. A&Ox4.   Patient is Full Code  Past Medical History:  Diagnosis Date   Acquired thrombophilia (HCC) 12/24/2021   Acute cough 08/23/2023   Acute recurrent frontal sinusitis 08/23/2023   Anticoagulated 07/01/2018   Anticoagulated 07/01/2018   Aortic atherosclerosis (HCC) 02/06/2024   Atrial fibrillation, chronic (HCC) 05/31/2020   Benign essential hypertension 05/06/2013   Bilateral pseudophakia 12/16/2015   Cellulitis of scrotum 02/06/2024   Chronic combined systolic and diastolic congestive heart failure (HCC) 02/06/2024   Chronic obstructive pulmonary disease (HCC) 11/05/2015   Chronic pain syndrome 08/30/2017   CKD stage 3b, GFR 30-44 ml/min (HCC) 07/01/2018   Class 3 severe obesity due to excess calories with serious comorbidity and body mass index (BMI) of 40.0 to 44.9 in adult (HCC) 10/04/2017   COPD (chronic obstructive pulmonary disease) (HCC) 07/01/2018   Depressive disorder  05/06/2013   Dermatochalasis 12/16/2015   Diabetic polyneuropathy (HCC) 02/23/2020   Disorder of bursae and tendons in shoulder region 01/05/2014   Edema 11/04/2015   Formatting of this note might be different from the original.  proBNP is low, 83, Echo wo findings of heart failure     Encounter for immunization 09/12/2023   Encounter for screening for pneumonia 05/06/2023   Gastroesophageal reflux disease without esophagitis 02/23/2020   Generalized edema 06/22/2016   Hyperbilirubinemia 02/06/2024   Hyperlipidemia 12/16/2015   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 stage 3b chronic kidney disease (HCC) 06/14/2019   Hypertensive heart disease 07/01/2018   Low back pain 08/17/2013   Lumbosacral spondylosis 05/06/2013   Mild protein malnutrition (HCC) 02/06/2024   Obstructive sleep apnea 07/01/2018   On amiodarone  therapy 03/24/2019   PAF (paroxysmal atrial fibrillation) (HCC) 07/01/2018   Persistent atrial fibrillation (HCC) 07/01/2018   Piriformis syndrome 05/03/2014   Postlaminectomy syndrome, lumbar region 05/06/2013   Presbyopia of both eyes 12/16/2015   Restless legs syndrome 03/23/2016   Restrictive lung disease 06/22/2016   Retroperitoneal mass 02/06/2024   Seasonal allergic rhinitis due to pollen 09/12/2023   SI (sacroiliac) pain 08/12/2020   Sleep apnea 05/06/2013   Splenomegaly 02/06/2024   Status post intraocular lens implant 12/16/2015   Thrombocytopenia (HCC) 02/06/2024   Type 2 diabetes mellitus without complications (HCC) 11/05/2015   Uncomplicated opioid dependence (HCC) 12/24/2021    Past Surgical History:  Procedure Laterality Date   APPENDECTOMY     BACK SURGERY     CARDIOVERSION N/A 12/01/2018   Procedure: CARDIOVERSION;  Surgeon: Luana Rumple, MD;  Location: [facility];  Service: Cardiovascular;  Laterality: N/A;   CATARACT EXTRACTION Bilateral    LUMBAR FUSION     NECK SURGERY       Allergies: Androgel [testosterone], Biaxin [clarithromycin], Duloxetine, Gabapentin, Ibuprofen, Lyrica [pregabalin], and Spironolactone  Medications: Prior to Admission medications   Medication Sig Start Date End Date Taking? Authorizing Provider  acetaminophen  (TYLENOL ) 500 MG tablet Take 1,000 mg by mouth every 6 (six) hours as needed for moderate pain or headache.   Yes [provider]  amiodarone  (PACERONE ) 200 MG tablet TAKE ONE TABLET BY MOUTH EVERY EVENING 07/14/23  Yes Hassan Links, MD  Budeson-Glycopyrrol-Formoterol  (BREZTRI  AEROSPHERE) 160-9-4.8 MCG/ACT AERO Inhale 2 puffs into the lungs 2 (two) times daily. 09/15/23  Yes Cox, Kirsten, MD  cholecalciferol (VITAMIN D3) 25 MCG (1000 UT) tablet Take 1,000 Units by mouth daily.    Yes [provider]  dapagliflozin  propanediol (FARXIGA ) 10 MG TABS tablet Take 1 tablet (10 mg total) by mouth daily before breakfast. 09/01/22  Yes Cox, Kirsten, MD  HYDROcodone-acetaminophen  [facility]) 10-325 MG tablet Take 1-2 tablets by mouth 2 (two) times daily as needed for moderate pain (pain score 4-6). 05/17/17  Yes [provider]  icosapent  Ethyl (VASCEPA ) 1 g capsule Take 2 capsules (2 g total) by mouth 2 (two) times daily. Patient taking differently: Take 1 g by mouth 2 (two) times daily. 09/09/23  Yes Cox, Kirsten, MD  insulin  degludec (TRESIBA  FLEXTOUCH) 100 UNIT/ML FlexTouch Pen Inject 60 Units into the skin every evening.   Yes [provider]  ipratropium-albuterol  (DUONEB) 0.5-2.5 (3) MG/3ML SOLN INHALE CONTENTS OF 1 VIAL VIA NEBULIZER EVERY 6 HOURS AS NEEDED FOR SHORTNESS OF BREATH 02/23/24  Yes Cox, Kirsten, MD  levocetirizine (XYZAL) 5 MG tablet Take 5 mg by mouth daily as needed for allergies.   Yes [provider]  metolazone  (ZAROXOLYN ) 5 MG tablet Take 5 mg by mouth once a week. May take an additional 5 mg tablet for swelling if needed. 04/26/20  Yes [provider]  metoprolol   tartrate (LOPRESSOR ) 50 MG tablet TAKE 1 TABLET BY MOUTH EVERY MORNING, TAKE 1 TABLET IN THE EVENING AND TAKE 1 TABLET AT BEDTIME 11/25/23  Yes Munley, Margart Shears, MD  montelukast  (SINGULAIR ) 10 MG tablet TAKE 1 TABLET BY MOUTH EVERY DAY AT BEDTIME 01/24/24  Yes Cox, Kirsten, MD  Omeprazole 20 MG TBEC Take 20 mg by mouth 2 (two) times daily before a meal.  06/25/14  Yes [provider]  potassium chloride  SA (KLOR-CON  M) 20 MEQ tablet TAKE 2 TABLETS BY MOUTH AT BREAKFAST AND AT BEDTIME 10/29/23  Yes Cox, Kirsten, MD  rOPINIRole  (REQUIP ) 1 MG tablet Take 1 tablet (1 mg total) by mouth at bedtime. Patient taking differently: Take 1 mg by mouth at bedtime as needed (restless legs). 09/07/23  Yes Cox, Kirsten, MD  rosuvastatin  (CRESTOR ) 40 MG tablet TAKE 1 TABLET BY MOUTH EVERY DAY AT BEDTIME 10/29/23  Yes Cox, Kirsten, MD  Semaglutide , 1 MG/DOSE, (OZEMPIC , 1 MG/DOSE,) 2 MG/1.5ML SOPN Inject 1 mg into the skin once a week. 03/24/23  Yes Cox, Kirsten, MD  sertraline  (ZOLOFT ) 50 MG tablet TAKE 1 TABLET BY MOUTH AT BREAKFAST AND AT BEDTIME 10/29/23  Yes Cox, Kirsten, MD  tamsulosin  (FLOMAX ) 0.4 MG CAPS capsule TAKE 1 CAPSULE BY MOUTH EVERY DAY AT BEDTIME 01/24/24  Yes Cox, Kirsten, MD  tiZANidine  (ZANAFLEX ) 2 MG tablet TAKE 1 TABLET BY MOUTH EVERY DAY AT BEDTIME 10/29/23  Yes Cox, Kirsten, MD  torsemide  (DEMADEX ) 20  MG tablet TAKE TWO (2) TABLETS BY MOUTH EACH MORNING AND TAKE 2 TABLETS AT NOON 10/29/23  Yes Cox, Kirsten, MD  VENTOLIN  HFA 108 (90 Base) MCG/ACT inhaler INHALE 2 PUFFS BY MOUTH EVERY 4 HOURS AS NEEDED FOR SHORTNESS OF BREATH AND WHEEZING 08/19/23  Yes Cox, Kirsten, MD  apixaban  (ELIQUIS ) 5 MG TABS tablet Take 1 tablet (5 mg total) by mouth 2 (two) times daily. 12/23/23   Hassan Links, MD  fluconazole  (DIFLUCAN ) 100 MG tablet Take 1 tablet (100 mg total) by mouth daily. Patient not taking: Reported on 03/20/2024 02/15/24   CoxBurleigh Carp, MD  nystatin  (MYCOSTATIN /NYSTOP ) powder Apply 1  Application topically 3 (three) times daily. Patient not taking: Reported on 03/20/2024 02/15/24   CoxBurleigh Carp, MD     Family History  Problem Relation Age of Onset   Atrial fibrillation Mother    Cancer Mother        vaginal cancer   Heart disease Father     Social History   Socioeconomic History   Marital status: Married    Spouse name: Not on file   Number of children: Not on file   Years of education: Not on file   Highest education level: Not on file  Occupational History   Not on file  Tobacco Use   Smoking status: Former    Current packs/day: 0.00    Types: Cigarettes    Quit date: 58    Years since quitting: 32.3   Smokeless tobacco: Never  Vaping Use   Vaping status: Never Used  Substance and Sexual Activity   Alcohol use: Not Currently   Drug use: Not Currently    Types: Hydrocodone   Sexual activity: Not on file  Other Topics Concern   Not on file  Social History Narrative   Not on file   Social Drivers of Health   Financial Resource Strain: Low Risk  (08/12/2023)   Overall Financial Resource Strain (CARDIA)    Difficulty of Paying Living Expenses: Not hard at all  Food Insecurity: No Food Insecurity (02/06/2024)   Hunger Vital Sign    Worried About Running Out of Food in the Last Year: Never true    Ran Out of Food in the Last Year: Never true  Transportation Needs: No Transportation Needs (02/06/2024)   PRAPARE - Administrator, Civil Service (Medical): No    Lack of Transportation (Non-Medical): No  Physical Activity: Inactive (08/12/2023)   Exercise Vital Sign    Days of Exercise per Week: 0 days    Minutes of Exercise per Session: 0 min  Stress: No Stress Concern Present (08/12/2023)   [employer] of Occupational Health - Occupational Stress Questionnaire    Feeling of Stress : Not at all  Social Connections: Socially Integrated (02/06/2024)   Social Connection and Isolation Panel [NHANES]    Frequency of Communication with  Friends and Family: Three times a week    Frequency of Social Gatherings with Friends and Family: Once a week    Attends Religious Services: 1 to 4 times per year    Active Member of [employer] or Organizations: Yes    Attends Banker Meetings: 1 to 4 times per year    Marital Status: Married     Review of Systems: A 12 point ROS discussed and pertinent positives are indicated in the HPI above.  All other systems are negative.   Vital Signs: BP (!) 157/101   Pulse 98   Temp  98 F (36.7 C) (Oral)   Resp 10   Ht 6' (1.829 m)   Wt (!) 321 lb (145.6 kg)   SpO2 97%   BMI 43.54 kg/m   Advance Care Plan: No documents on file    Physical Exam Constitutional:      Appearance: He is obese.  HENT:     Mouth/Throat:     Mouth: Mucous membranes are moist.     Pharynx: Oropharynx is clear.  Cardiovascular:     Rate and Rhythm: Normal rate. Rhythm irregular.     Pulses: Normal pulses.     Heart sounds: Normal heart sounds.  Pulmonary:     Effort: Pulmonary effort is normal.     Comments: Diminished bilaterally Abdominal:     General: There is distension.     Palpations: Abdomen is soft.     Tenderness: There is no abdominal tenderness.  Musculoskeletal:     Right lower leg: Edema present.     Left lower leg: Edema present.     Comments: 1+ BLE  Skin:    General: Skin is warm and dry.     Coloration: Skin is not jaundiced.  Neurological:     Mental Status: He is alert and oriented to person, place, and time.  Psychiatric:        Mood and Affect: Mood normal.        Behavior: Behavior normal.        Thought Content: Thought content normal.        Judgment: Judgment normal.      Labs:  CBC: Recent Labs    02/08/24 0421 02/09/24 0454 02/29/24 1311 03/20/24 1200  WBC 8.4 8.7 6.8 8.9  HGB 14.2 14.1 14.5 14.7  HCT 46.4 46.4 46.1 46.0  PLT 158 178 133* 153    COAGS: Recent Labs    03/20/24 1200  INR 1.0    BMP: Recent Labs    02/07/24 0412  02/08/24 0421 02/09/24 0454 02/29/24 1311  NA 138 136 140 142  K 3.6 3.4* 3.5 4.4  CL 106 105 104 105  CO2 23 23 26 30   GLUCOSE 125* 157* 105* 109*  BUN 16 18 20 14   CALCIUM  8.9 8.8* 9.1 10.0  CREATININE 1.21 1.30* 1.26* 1.43*  GFRNONAA >60 56* 58* 50*    LIVER FUNCTION TESTS: Recent Labs    02/07/24 0412 02/08/24 0421 02/09/24 0454 02/29/24 1311  BILITOT 1.7* 1.2 1.1 0.8  AST 22 21 21  40  ALT 17 18 17 29   ALKPHOS 81 89 94 105  PROT 6.3* 6.2* 6.3* 6.6  ALBUMIN 2.8* 2.7* 2.8* 3.5    TUMOR MARKERS: No results for input(s): "AFPTM", "CEA", "CA199", "CHROMGRNA" in the last 8760 hours.  Assessment and Plan:  Request for image guided retroperitoneal mass biopsy approved and scheduled for 03/20/24. VS and lab values reviewed and within acceptable limits for procedure. 02/29/24 CT abd available and reviewed.  No contraindications for procedure with moderate sedation identified.   Risks and benefits of image guided retroperitoneal mass biopsy was discussed with the patient and/or patient's family including, but not limited to bleeding, infection, damage to adjacent structures or low yield requiring additional tests.  All of the questions were answered and there is agreement to proceed.  Consent signed and in chart.  Thank you for allowing our service to participate in TAISHAWN SMALDONE 's care.    Electronically Signed: Terressa Fess, NP   03/20/2024, 1:36 PM     I  spent a total of  30 Minutes   in face to face in clinical consultation, greater than 50% of which was counseling/coordinating care for image guided retroperitoneal mass biopsy.    (A copy of this note was sent to the referring provider and the time of visit.)

## 2024-03-21 ENCOUNTER — Ambulatory Visit: Payer: Medicare HMO | Admitting: Family Medicine

## 2024-03-21 DIAGNOSIS — E66813 Obesity, class 3: Secondary | ICD-10-CM | POA: Diagnosis not present

## 2024-03-21 DIAGNOSIS — J449 Chronic obstructive pulmonary disease, unspecified: Secondary | ICD-10-CM | POA: Diagnosis not present

## 2024-03-21 DIAGNOSIS — F419 Anxiety disorder, unspecified: Secondary | ICD-10-CM | POA: Diagnosis not present

## 2024-03-21 DIAGNOSIS — G2581 Restless legs syndrome: Secondary | ICD-10-CM | POA: Diagnosis not present

## 2024-03-21 DIAGNOSIS — N1832 Chronic kidney disease, stage 3b: Secondary | ICD-10-CM | POA: Diagnosis not present

## 2024-03-21 DIAGNOSIS — K219 Gastro-esophageal reflux disease without esophagitis: Secondary | ICD-10-CM | POA: Diagnosis not present

## 2024-03-21 DIAGNOSIS — R1900 Intra-abdominal and pelvic swelling, mass and lump, unspecified site: Secondary | ICD-10-CM | POA: Diagnosis not present

## 2024-03-21 DIAGNOSIS — I130 Hypertensive heart and chronic kidney disease with heart failure and stage 1 through stage 4 chronic kidney disease, or unspecified chronic kidney disease: Secondary | ICD-10-CM | POA: Diagnosis not present

## 2024-03-21 DIAGNOSIS — M545 Low back pain, unspecified: Secondary | ICD-10-CM | POA: Diagnosis not present

## 2024-03-21 DIAGNOSIS — J984 Other disorders of lung: Secondary | ICD-10-CM | POA: Diagnosis not present

## 2024-03-21 DIAGNOSIS — E1122 Type 2 diabetes mellitus with diabetic chronic kidney disease: Secondary | ICD-10-CM | POA: Diagnosis not present

## 2024-03-21 DIAGNOSIS — G894 Chronic pain syndrome: Secondary | ICD-10-CM | POA: Diagnosis not present

## 2024-03-21 DIAGNOSIS — E1142 Type 2 diabetes mellitus with diabetic polyneuropathy: Secondary | ICD-10-CM | POA: Diagnosis not present

## 2024-03-21 DIAGNOSIS — E441 Mild protein-calorie malnutrition: Secondary | ICD-10-CM | POA: Diagnosis not present

## 2024-03-21 DIAGNOSIS — I5042 Chronic combined systolic (congestive) and diastolic (congestive) heart failure: Secondary | ICD-10-CM | POA: Diagnosis not present

## 2024-03-21 DIAGNOSIS — E785 Hyperlipidemia, unspecified: Secondary | ICD-10-CM | POA: Diagnosis not present

## 2024-03-21 DIAGNOSIS — D696 Thrombocytopenia, unspecified: Secondary | ICD-10-CM | POA: Diagnosis not present

## 2024-03-21 DIAGNOSIS — I480 Paroxysmal atrial fibrillation: Secondary | ICD-10-CM | POA: Diagnosis not present

## 2024-03-21 DIAGNOSIS — R161 Splenomegaly, not elsewhere classified: Secondary | ICD-10-CM | POA: Diagnosis not present

## 2024-03-21 DIAGNOSIS — E876 Hypokalemia: Secondary | ICD-10-CM | POA: Diagnosis not present

## 2024-03-21 DIAGNOSIS — N453 Epididymo-orchitis: Secondary | ICD-10-CM | POA: Diagnosis not present

## 2024-03-21 DIAGNOSIS — F32A Depression, unspecified: Secondary | ICD-10-CM | POA: Diagnosis not present

## 2024-03-21 DIAGNOSIS — G4733 Obstructive sleep apnea (adult) (pediatric): Secondary | ICD-10-CM | POA: Diagnosis not present

## 2024-03-21 DIAGNOSIS — I700 Atherosclerosis of aorta: Secondary | ICD-10-CM | POA: Diagnosis not present

## 2024-03-21 DIAGNOSIS — N492 Inflammatory disorders of scrotum: Secondary | ICD-10-CM | POA: Diagnosis not present

## 2024-03-21 LAB — SURGICAL PATHOLOGY

## 2024-03-22 ENCOUNTER — Encounter: Payer: Self-pay | Admitting: Medical Oncology

## 2024-03-22 ENCOUNTER — Other Ambulatory Visit: Payer: Self-pay | Admitting: Physician Assistant

## 2024-03-22 ENCOUNTER — Telehealth: Payer: Self-pay | Admitting: Hematology and Oncology

## 2024-03-22 DIAGNOSIS — R1900 Intra-abdominal and pelvic swelling, mass and lump, unspecified site: Secondary | ICD-10-CM

## 2024-03-22 NOTE — Progress Notes (Signed)
 [facility]  Call to patient's daughter, Shawn Sullivan, to inform her per Wyline Hearing, PA-C, biopsy resulted with no malignancy. Due to the size of the mass though, [name] was placing a referral for surgical consult for resection. Daughter expressed understand, denied questions at this time and confirmed upcoming appointment with Dr. Rosaline Coma.  Daughter encouraged to call with further questions/concerns.   Esperanza Hedges, RN, BSN, [facility] Oncology Nurse Navigator, [facility] 03/22/2024 11:19 AM

## 2024-03-24 ENCOUNTER — Telehealth: Payer: Self-pay

## 2024-03-24 ENCOUNTER — Telehealth: Payer: Self-pay | Admitting: Physician Assistant

## 2024-03-24 ENCOUNTER — Encounter: Payer: Self-pay | Admitting: Physician Assistant

## 2024-03-24 NOTE — Telephone Encounter (Signed)
 Referral to tertiary center for excisional biopsy vs resection of lipomatous RP mass sent via fax.  Received confirmation of successful transmission.

## 2024-03-24 NOTE — Telephone Encounter (Signed)
 Contacted patient's daughter Carmelina Chinchilla to discuss recommendation from general surgery.  Per discussion with Dr. Cherlynn Cornfield it is recommended for patient to have evaluation at a tertiary center for resection versus excisional biopsy.  Referral has been faxed to Dr. Kirk Peper the surgical oncologist at [facility].  Daughter was also provided with office phone number to help with scheduling.  She is agreeable with plan and has no further questions at this time.

## 2024-03-28 ENCOUNTER — Other Ambulatory Visit

## 2024-03-28 ENCOUNTER — Ambulatory Visit: Admitting: Hematology and Oncology

## 2024-03-28 DIAGNOSIS — G894 Chronic pain syndrome: Secondary | ICD-10-CM | POA: Diagnosis not present

## 2024-03-28 DIAGNOSIS — N1832 Chronic kidney disease, stage 3b: Secondary | ICD-10-CM | POA: Diagnosis not present

## 2024-03-28 DIAGNOSIS — J984 Other disorders of lung: Secondary | ICD-10-CM | POA: Diagnosis not present

## 2024-03-28 DIAGNOSIS — E1142 Type 2 diabetes mellitus with diabetic polyneuropathy: Secondary | ICD-10-CM | POA: Diagnosis not present

## 2024-03-28 DIAGNOSIS — I5042 Chronic combined systolic (congestive) and diastolic (congestive) heart failure: Secondary | ICD-10-CM | POA: Diagnosis not present

## 2024-03-28 DIAGNOSIS — F419 Anxiety disorder, unspecified: Secondary | ICD-10-CM | POA: Diagnosis not present

## 2024-03-28 DIAGNOSIS — R161 Splenomegaly, not elsewhere classified: Secondary | ICD-10-CM | POA: Diagnosis not present

## 2024-03-28 DIAGNOSIS — G2581 Restless legs syndrome: Secondary | ICD-10-CM | POA: Diagnosis not present

## 2024-03-28 DIAGNOSIS — E876 Hypokalemia: Secondary | ICD-10-CM | POA: Diagnosis not present

## 2024-03-28 DIAGNOSIS — J449 Chronic obstructive pulmonary disease, unspecified: Secondary | ICD-10-CM | POA: Diagnosis not present

## 2024-03-28 DIAGNOSIS — F32A Depression, unspecified: Secondary | ICD-10-CM | POA: Diagnosis not present

## 2024-03-28 DIAGNOSIS — E1122 Type 2 diabetes mellitus with diabetic chronic kidney disease: Secondary | ICD-10-CM | POA: Diagnosis not present

## 2024-03-28 DIAGNOSIS — N492 Inflammatory disorders of scrotum: Secondary | ICD-10-CM | POA: Diagnosis not present

## 2024-03-28 DIAGNOSIS — E785 Hyperlipidemia, unspecified: Secondary | ICD-10-CM | POA: Diagnosis not present

## 2024-03-28 DIAGNOSIS — M545 Low back pain, unspecified: Secondary | ICD-10-CM | POA: Diagnosis not present

## 2024-03-28 DIAGNOSIS — G4733 Obstructive sleep apnea (adult) (pediatric): Secondary | ICD-10-CM | POA: Diagnosis not present

## 2024-03-28 DIAGNOSIS — K219 Gastro-esophageal reflux disease without esophagitis: Secondary | ICD-10-CM | POA: Diagnosis not present

## 2024-03-28 DIAGNOSIS — I130 Hypertensive heart and chronic kidney disease with heart failure and stage 1 through stage 4 chronic kidney disease, or unspecified chronic kidney disease: Secondary | ICD-10-CM | POA: Diagnosis not present

## 2024-03-28 DIAGNOSIS — I480 Paroxysmal atrial fibrillation: Secondary | ICD-10-CM | POA: Diagnosis not present

## 2024-03-28 DIAGNOSIS — E66813 Obesity, class 3: Secondary | ICD-10-CM | POA: Diagnosis not present

## 2024-03-28 DIAGNOSIS — E441 Mild protein-calorie malnutrition: Secondary | ICD-10-CM | POA: Diagnosis not present

## 2024-03-28 DIAGNOSIS — D696 Thrombocytopenia, unspecified: Secondary | ICD-10-CM | POA: Diagnosis not present

## 2024-03-28 DIAGNOSIS — I700 Atherosclerosis of aorta: Secondary | ICD-10-CM | POA: Diagnosis not present

## 2024-03-28 DIAGNOSIS — R1900 Intra-abdominal and pelvic swelling, mass and lump, unspecified site: Secondary | ICD-10-CM | POA: Diagnosis not present

## 2024-03-28 DIAGNOSIS — N453 Epididymo-orchitis: Secondary | ICD-10-CM | POA: Diagnosis not present

## 2024-04-04 ENCOUNTER — Encounter: Payer: Self-pay | Admitting: Medical Oncology

## 2024-04-06 NOTE — Progress Notes (Unsigned)
 Subjective:  Patient ID: Shawn Sullivan, male    DOB: [birth year]  Age: 78 y.o. MRN: [id number]  Chief Complaint  Patient presents with   Medical Management of Chronic Issues    HPI:  Diabetes:  Complications: neuropathy.  Glucose checking: daily Glucose logs: 130-170 fasting. Hypoglycemia: no Most recent A1C: 7.7 Current medications: farxiga  10 mg daily, tresiba  60 U daily. Taking ozempic  1 mg weekly.  Last Eye Exam: due for an appt.  Foot checks: continues to check daily.    Hyperlipidemia: Current medications: crestor  40 mg daily and coenzyme q10.    Hypertension: Complications: CKD. Kidney function stable.  Current medications: on metoprolol  tartrate 50 mg three times a day, torsemide  20 mg 2 po twice daily, metolazone  5 mg once a wk as needed swelling. and potassium 20 meq 2 tablet twice daily.  Medications: checked and unchanged. Swelling has improved.    Atrial fibrillation: on amiodarone  200 mg daily, metoprolol  50 mg three times a day and eliquis  5 mg twice daily. Dr. Sandee Crook gave good report.    RLS: on requip  0.5 mg once daily as needed.    Chronic pain syndrome: Sees pain clinic.    Mild recurrent depression: on zoloft  50 mg twice daily    GERD: on omeprazole 20 mg 1 pill  oral twice daily    COPD: Breztri  2 puffs twice daily. Using duoneb treatments/albuterol  hfa once a day also on singulair  and xyzal prn   Diet: not healthy. Eating late at night. exercise: walking some.      12/20/2023   11:07 AM 08/12/2023    2:08 PM 02/15/2023   11:23 AM 11/09/2022   11:16 AM 08/20/2022   12:01 PM  Depression screen PHQ 2/9  Decreased Interest 1 0 0 0 0  Down, Depressed, Hopeless 0 0 0 0 0  PHQ - 2 Score 1 0 0 0 0  Altered sleeping 3  0  3  Tired, decreased energy 3  0  3  Change in appetite 0  0  3  Feeling bad or failure about yourself  0  0  0  Trouble concentrating 0  0  0  Moving slowly or fidgety/restless 0  0  0  Suicidal thoughts 0  0  0  PHQ-9 Score  7  0  9  Difficult doing work/chores Not difficult at all  Not difficult at all  Somewhat difficult        08/12/2023    2:12 PM  Fall Risk   Falls in the past year? 0  Number falls in past yr: 0  Injury with Fall? 0  Risk for fall due to : No Fall Risks  Follow up Falls prevention discussed    Patient Care Team: Mercy Stall, MD as PCP - General (Family Medicine) Devon Fogo, [facility] (Inactive) (Pharmacist) Hassan Links, MD as Consulting Physician (Cardiology) Moishe Angel, PA-C as Physician Assistant (Physician Assistant) Allie Ivanoff, MD as Referring Physician (Dermatology) Clementine Cutting, MD as Consulting Physician (Nephrology) Annella Barrows, RN as Oncology Nurse Navigator (Medical Oncology)   Review of Systems  Constitutional:  Negative for chills, diaphoresis, fatigue and fever.  HENT:  Negative for congestion, ear pain and sore throat.   Respiratory:  Negative for cough and shortness of breath.   Cardiovascular:  Negative for chest pain and leg swelling.  Gastrointestinal:  Positive for abdominal pain (Has abd mass). Negative for constipation, diarrhea, nausea and vomiting.  Genitourinary:  Negative for  dysuria and urgency.  Musculoskeletal:  Negative for arthralgias and myalgias.  Neurological:  Negative for dizziness and headaches.  Psychiatric/Behavioral:  Negative for dysphoric mood.     Current Outpatient Medications on File Prior to Visit  Medication Sig Dispense Refill   acetaminophen  (TYLENOL ) 500 MG tablet Take 1,000 mg by mouth every 6 (six) hours as needed for moderate pain or headache.     amiodarone  (PACERONE ) 200 MG tablet TAKE ONE TABLET BY MOUTH EVERY EVENING 90 tablet 3   apixaban  (ELIQUIS ) 5 MG TABS tablet Take 1 tablet (5 mg total) by mouth 2 (two) times daily. 28 tablet 0   Budeson-Glycopyrrol-Formoterol  (BREZTRI  AEROSPHERE) 160-9-4.8 MCG/ACT AERO Inhale 2 puffs into the lungs 2 (two) times daily. 10.7 g 3   cholecalciferol  (VITAMIN D3) 25 MCG (1000 UT) tablet Take 1,000 Units by mouth daily.      dapagliflozin  propanediol (FARXIGA ) 10 MG TABS tablet Take 1 tablet (10 mg total) by mouth daily before breakfast. 90 tablet 3   HYDROcodone-acetaminophen  (NORCO) 10-325 MG tablet Take 1-2 tablets by mouth 2 (two) times daily as needed for moderate pain (pain score 4-6).     icosapent  Ethyl (VASCEPA ) 1 g capsule Take 2 capsules (2 g total) by mouth 2 (two) times daily. (Patient taking differently: Take 1 g by mouth 2 (two) times daily.) 120 capsule 2   insulin  degludec (TRESIBA  FLEXTOUCH) 100 UNIT/ML FlexTouch Pen Inject 60 Units into the skin every evening.     ipratropium-albuterol  (DUONEB) 0.5-2.5 (3) MG/3ML SOLN INHALE CONTENTS OF 1 VIAL VIA NEBULIZER EVERY 6 HOURS AS NEEDED FOR SHORTNESS OF BREATH 360 mL 10   levocetirizine (XYZAL) 5 MG tablet Take 5 mg by mouth daily as needed for allergies.     metolazone  (ZAROXOLYN ) 5 MG tablet Take 5 mg by mouth once a week. May take an additional 5 mg tablet for swelling if needed.     metoprolol  tartrate (LOPRESSOR ) 50 MG tablet TAKE 1 TABLET BY MOUTH EVERY MORNING, TAKE 1 TABLET IN THE EVENING AND TAKE 1 TABLET AT BEDTIME 90 tablet 6   montelukast  (SINGULAIR ) 10 MG tablet TAKE 1 TABLET BY MOUTH EVERY DAY AT BEDTIME 30 tablet 10   nystatin  (MYCOSTATIN /NYSTOP ) powder Apply 1 Application topically 3 (three) times daily. (Patient not taking: Reported on 03/20/2024) 30 g 1   Omeprazole 20 MG TBEC Take 20 mg by mouth 2 (two) times daily before a meal.      potassium chloride  SA (KLOR-CON  M) 20 MEQ tablet TAKE 2 TABLETS BY MOUTH AT BREAKFAST AND AT BEDTIME 120 tablet 10   rOPINIRole  (REQUIP ) 1 MG tablet Take 1 tablet (1 mg total) by mouth at bedtime. (Patient taking differently: Take 1 mg by mouth at bedtime as needed (restless legs).) 30 tablet 2   rosuvastatin  (CRESTOR ) 40 MG tablet TAKE 1 TABLET BY MOUTH EVERY DAY AT BEDTIME 30 tablet 10   Semaglutide , 1 MG/DOSE, (OZEMPIC , 1 MG/DOSE,) 2  MG/1.5ML SOPN Inject 1 mg into the skin once a week. 9 mL 3   sertraline  (ZOLOFT ) 50 MG tablet TAKE 1 TABLET BY MOUTH AT BREAKFAST AND AT BEDTIME 60 tablet 10   tamsulosin  (FLOMAX ) 0.4 MG CAPS capsule TAKE 1 CAPSULE BY MOUTH EVERY DAY AT BEDTIME 30 capsule 10   tiZANidine  (ZANAFLEX ) 2 MG tablet TAKE 1 TABLET BY MOUTH EVERY DAY AT BEDTIME 30 tablet 10   torsemide  (DEMADEX ) 20 MG tablet TAKE TWO (2) TABLETS BY MOUTH EACH MORNING AND TAKE 2 TABLETS AT NOON 120  tablet 10   VENTOLIN  HFA 108 (90 Base) MCG/ACT inhaler INHALE 2 PUFFS BY MOUTH EVERY 4 HOURS AS NEEDED FOR SHORTNESS OF BREATH AND WHEEZING 18 g 10   No current facility-administered medications on file prior to visit.   Past Medical History:  Diagnosis Date   Acquired thrombophilia (HCC) 12/24/2021   Acute cough 08/23/2023   Acute recurrent frontal sinusitis 08/23/2023   Anticoagulated 07/01/2018   Anticoagulated 07/01/2018   Aortic atherosclerosis (HCC) 02/06/2024   Atrial fibrillation, chronic (HCC) 05/31/2020   Benign essential hypertension 05/06/2013   Bilateral pseudophakia 12/16/2015   Cellulitis of scrotum 02/06/2024   Chronic combined systolic and diastolic congestive heart failure (HCC) 02/06/2024   Chronic obstructive pulmonary disease (HCC) 11/05/2015   Chronic pain syndrome 08/30/2017   CKD stage 3b, GFR 30-44 ml/min (HCC) 07/01/2018   Class 3 severe obesity due to excess calories with serious comorbidity and body mass index (BMI) of 40.0 to 44.9 in adult 10/04/2017   COPD (chronic obstructive pulmonary disease) (HCC) 07/01/2018   Depressive disorder 05/06/2013   Dermatochalasis 12/16/2015   Diabetic polyneuropathy (HCC) 02/23/2020   Disorder of bursae and tendons in shoulder region 01/05/2014   Edema 11/04/2015   Formatting of this note might be different from the original.  proBNP is low, 83, Echo wo findings of heart failure     Encounter for immunization 09/12/2023   Encounter for screening for pneumonia  05/06/2023   Gastroesophageal reflux disease without esophagitis 02/23/2020   Generalized edema 06/22/2016   Hyperbilirubinemia 02/06/2024   Hyperlipidemia 12/16/2015   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 stage 3b chronic kidney disease (HCC) 06/14/2019   Hypertensive heart disease 07/01/2018   Low back pain 08/17/2013   Lumbosacral spondylosis 05/06/2013   Mild protein malnutrition (HCC) 02/06/2024   Obstructive sleep apnea 07/01/2018   On amiodarone  therapy 03/24/2019   PAF (paroxysmal atrial fibrillation) (HCC) 07/01/2018   Persistent atrial fibrillation (HCC) 07/01/2018   Piriformis syndrome 05/03/2014   Postlaminectomy syndrome, lumbar region 05/06/2013   Presbyopia of both eyes 12/16/2015   Restless legs syndrome 03/23/2016   Restrictive lung disease 06/22/2016   Retroperitoneal mass 02/06/2024   Seasonal allergic rhinitis due to pollen 09/12/2023   SI (sacroiliac) pain 08/12/2020   Sleep apnea 05/06/2013   Splenomegaly 02/06/2024   Status post intraocular lens implant 12/16/2015   Thrombocytopenia (HCC) 02/06/2024   Type 2 diabetes mellitus without complications (HCC) 11/05/2015   Uncomplicated opioid dependence (HCC) 12/24/2021   Past Surgical History:  Procedure Laterality Date   APPENDECTOMY     BACK SURGERY     CARDIOVERSION N/A 12/01/2018   Procedure: CARDIOVERSION;  Surgeon: Luana Rumple, MD;  Location: [facility];  Service: Cardiovascular;  Laterality: N/A;   CATARACT EXTRACTION Bilateral    LUMBAR FUSION     NECK SURGERY      Family History  Problem Relation Age of Onset   Atrial fibrillation Mother    Cancer Mother        vaginal cancer   Heart disease Father    Social History   Socioeconomic History   Marital status: Married    Spouse name: Not on file   Number of children: Not on file   Years of education: Not on file   Highest education level: Not on file  Occupational  History   Not on file  Tobacco Use   Smoking status: Former    Current packs/day: 0.00  Types: Cigarettes    Quit date: 54    Years since quitting: 32.3   Smokeless tobacco: Never  Vaping Use   Vaping status: Never Used  Substance and Sexual Activity   Alcohol use: Not Currently   Drug use: Not Currently    Types: Hydrocodone   Sexual activity: Not on file  Other Topics Concern   Not on file  Social History Narrative   Not on file   Social Drivers of Health   Financial Resource Strain: Low Risk  (08/12/2023)   Overall Financial Resource Strain (CARDIA)    Difficulty of Paying Living Expenses: Not hard at all  Food Insecurity: No Food Insecurity (02/06/2024)   Hunger Vital Sign    Worried About Running Out of Food in the Last Year: Never true    Ran Out of Food in the Last Year: Never true  Transportation Needs: No Transportation Needs (02/06/2024)   PRAPARE - Administrator, Civil Service (Medical): No    Lack of Transportation (Non-Medical): No  Physical Activity: Inactive (08/12/2023)   Exercise Vital Sign    Days of Exercise per Week: 0 days    Minutes of Exercise per Session: 0 min  Stress: No Stress Concern Present (08/12/2023)   [employer] of Occupational Health - Occupational Stress Questionnaire    Feeling of Stress : Not at all  Social Connections: Socially Integrated (02/06/2024)   Social Connection and Isolation Panel [NHANES]    Frequency of Communication with Friends and Family: Three times a week    Frequency of Social Gatherings with Friends and Family: Once a week    Attends Religious Services: 1 to 4 times per year    Active Member of [employer] or Organizations: Yes    Attends Banker Meetings: 1 to 4 times per year    Marital Status: Married    Objective:  BP 112/70   Pulse (!) 103   Temp 98 F (36.7 C)   Ht 6\' 1"  (1.854 m)   Wt (!) 322 lb (146.1 kg)   SpO2 92%   BMI 42.48 kg/m      04/07/2024   11:13 AM 03/20/2024     2:54 PM 03/20/2024    2:45 PM  BP/Weight  Systolic BP 112 151 144  Diastolic BP 70 100 97  Wt. (Lbs) 322    BMI 42.48 kg/m2      Physical Exam Vitals reviewed.  Constitutional:      Appearance: Normal appearance. He is obese.  Neck:     Vascular: No carotid bruit.  Cardiovascular:     Rate and Rhythm: Normal rate and regular rhythm.     Heart sounds: No murmur heard. Pulmonary:     Effort: Pulmonary effort is normal.     Breath sounds: Normal breath sounds.  Abdominal:     General: Abdomen is flat. Bowel sounds are normal.     Palpations: Abdomen is soft.     Tenderness: There is no abdominal tenderness.  Neurological:     Mental Status: He is alert and oriented to person, place, and time.  Psychiatric:        Mood and Affect: Mood normal.        Behavior: Behavior normal.     Diabetic Foot Exam - Simple   Simple Foot Form Diabetic Foot exam was performed with the following findings: Yes 04/07/2024 11:22 AM  Visual Inspection No deformities, no ulcerations, no other skin breakdown bilaterally: Yes Sensation Testing  Intact to touch and monofilament testing bilaterally: Yes Pulse Check Posterior Tibialis and Dorsalis pulse intact bilaterally: Yes Comments      Lab Results  Component Value Date   WBC 9.0 04/07/2024   HGB 15.6 04/07/2024   HCT 48.8 04/07/2024   PLT 199 04/07/2024   GLUCOSE 119 (H) 04/07/2024   CHOL 140 04/07/2024   TRIG 166 (H) 04/07/2024   HDL 32 (L) 04/07/2024   LDLCALC 79 04/07/2024   ALT 21 04/07/2024   AST 32 04/07/2024   NA 143 04/07/2024   K 3.7 04/07/2024   CL 99 04/07/2024   CREATININE 1.62 (H) 04/07/2024   BUN 23 04/07/2024   CO2 25 04/07/2024   TSH 3.790 11/09/2022   INR 1.0 03/20/2024   HGBA1C 7.0 (H) 04/07/2024   MICROALBUR 80 01/14/2021      Assessment & Plan:  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 stage 3b chronic kidney disease  (HCC) Assessment & Plan: Well controlled.  No changes to medicines. Continue on metoprolol  tartrate 50 mg three times a day, torsemide  20 mg 2 po twice daily, metolazone  5 mg once a wk as needed swelling. and potassium 20 meq two twice daily.  Continue to work on eating a healthy diet and exercise.  Labs drawn today.    Orders: -     Comprehensive metabolic panel with GFR  Mixed hyperlipidemia Assessment & Plan: Well controlled.  No changes to medicines. Continue crestor  40 mg before bed and coenzyme q10 daily.  Continue to work on eating a healthy diet and exercise.  Labs drawn today.    Orders: -     Lipid panel  Diabetic polyneuropathy associated with type 2 diabetes mellitus (HCC) Assessment & Plan: Control: good Recommend check sugars fasting daily. Recommend check feet daily. Recommend annual eye exams. Medicines: Continue : farxiga  10 mg daily, tresiba  60 U daily. Taking ozempic  1 mg weekly.    Continue to work on eating a healthy diet and exercise.  Labs drawn today.     Orders: -     CBC with Differential/Platelet -     Hemoglobin A1c  PAF (paroxysmal atrial fibrillation) (HCC) Assessment & Plan: Continue amiodarone  200 mg daily, metoprolol  50 mg three times a day and eliquis  5 mg twice daily. Issue with cost of eliquis . Refer to pharmacy.   Uncomplicated opioid dependence (HCC) Assessment & Plan: Management per specialist. Treating for chronic pain.   Obstructive sleep apnea Assessment & Plan: Continue cpap.   Simple chronic bronchitis (HCC) Assessment & Plan: Stable.  Continue Breztri  2 puffs twice daily. Using duoneb treatments/albuterol  hfa once a day a most. Also on singulair  and xyzal prn   Other orders -     traZODone HCl; Take 0.5-1 tablets (25-50 mg total) by mouth at bedtime as needed for sleep.  Dispense: 30 tablet; Refill: 3     Meds ordered this encounter  Medications   traZODone (DESYREL) 50 MG tablet    Sig: Take 0.5-1 tablets  (25-50 mg total) by mouth at bedtime as needed for sleep.    Dispense:  30 tablet    Refill:  3    Orders Placed This Encounter  Procedures   CBC with Differential/Platelet   Comprehensive metabolic panel with GFR   Lipid panel   Hemoglobin A1c     Follow-up: Return in about 3 months (around 07/08/2024) for chronic follow up.   I,Katherina A Bramblett,acting as a scribe for  Mercy Stall, MD.,have documented all relevant documentation on the behalf of Mercy Stall, MD,as directed by  Mercy Stall, MD while in the presence of Mercy Stall, MD.   An After Visit Summary was printed and given to the patient.  I attest that I have reviewed this visit and agree with the plan scribed by my staff.   Mercy Stall, MD [facility] [phone]

## 2024-04-07 ENCOUNTER — Ambulatory Visit (INDEPENDENT_AMBULATORY_CARE_PROVIDER_SITE_OTHER): Admitting: Family Medicine

## 2024-04-07 ENCOUNTER — Encounter: Payer: Self-pay | Admitting: Family Medicine

## 2024-04-07 VITALS — BP 112/70 | HR 103 | Temp 98.0°F | Ht 73.0 in | Wt 322.0 lb

## 2024-04-07 DIAGNOSIS — I480 Paroxysmal atrial fibrillation: Secondary | ICD-10-CM | POA: Diagnosis not present

## 2024-04-07 DIAGNOSIS — J410 Simple chronic bronchitis: Secondary | ICD-10-CM | POA: Diagnosis not present

## 2024-04-07 DIAGNOSIS — E1142 Type 2 diabetes mellitus with diabetic polyneuropathy: Secondary | ICD-10-CM | POA: Diagnosis not present

## 2024-04-07 DIAGNOSIS — G4733 Obstructive sleep apnea (adult) (pediatric): Secondary | ICD-10-CM | POA: Diagnosis not present

## 2024-04-07 DIAGNOSIS — I130 Hypertensive heart and chronic kidney disease with heart failure and stage 1 through stage 4 chronic kidney disease, or unspecified chronic kidney disease: Secondary | ICD-10-CM

## 2024-04-07 DIAGNOSIS — I5041 Acute combined systolic (congestive) and diastolic (congestive) heart failure: Secondary | ICD-10-CM

## 2024-04-07 DIAGNOSIS — F1120 Opioid dependence, uncomplicated: Secondary | ICD-10-CM | POA: Diagnosis not present

## 2024-04-07 DIAGNOSIS — N1832 Chronic kidney disease, stage 3b: Secondary | ICD-10-CM | POA: Diagnosis not present

## 2024-04-07 DIAGNOSIS — E782 Mixed hyperlipidemia: Secondary | ICD-10-CM | POA: Diagnosis not present

## 2024-04-07 LAB — COMPREHENSIVE METABOLIC PANEL WITH GFR
ALT: 21 IU/L (ref 0–44)
AST: 32 IU/L (ref 0–40)
Albumin: 3.9 g/dL (ref 3.8–4.8)
Alkaline Phosphatase: 144 IU/L — ABNORMAL HIGH (ref 44–121)
BUN/Creatinine Ratio: 14 (ref 10–24)
BUN: 23 mg/dL (ref 8–27)
Bilirubin Total: 1.3 mg/dL — ABNORMAL HIGH (ref 0.0–1.2)
CO2: 25 mmol/L (ref 20–29)
Calcium: 9.7 mg/dL (ref 8.6–10.2)
Chloride: 99 mmol/L (ref 96–106)
Creatinine, Ser: 1.62 mg/dL — ABNORMAL HIGH (ref 0.76–1.27)
Globulin, Total: 2.5 g/dL (ref 1.5–4.5)
Glucose: 119 mg/dL — ABNORMAL HIGH (ref 70–99)
Potassium: 3.7 mmol/L (ref 3.5–5.2)
Sodium: 143 mmol/L (ref 134–144)
Total Protein: 6.4 g/dL (ref 6.0–8.5)
eGFR: 43 mL/min/{1.73_m2} — ABNORMAL LOW (ref >59)

## 2024-04-07 LAB — CBC WITH DIFFERENTIAL/PLATELET
Basophils Absolute: 0 10*3/uL (ref 0.0–0.2)
Basos: 0 %
EOS (ABSOLUTE): 0.2 10*3/uL (ref 0.0–0.4)
Eos: 2 %
Hematocrit: 48.8 % (ref 37.5–51.0)
Hemoglobin: 15.6 g/dL (ref 13.0–17.7)
Immature Grans (Abs): 0 10*3/uL (ref 0.0–0.1)
Immature Granulocytes: 0 %
Lymphocytes Absolute: 1.8 10*3/uL (ref 0.7–3.1)
Lymphs: 20 %
MCH: 29.8 pg (ref 26.6–33.0)
MCHC: 32 g/dL (ref 31.5–35.7)
MCV: 93 fL (ref 79–97)
Monocytes Absolute: 0.7 10*3/uL (ref 0.1–0.9)
Monocytes: 7 %
Neutrophils Absolute: 6.3 10*3/uL (ref 1.4–7.0)
Neutrophils: 71 %
Platelets: 199 10*3/uL (ref 150–450)
RBC: 5.23 x10E6/uL (ref 4.14–5.80)
RDW: 14.1 % (ref 11.6–15.4)
WBC: 9 10*3/uL (ref 3.4–10.8)

## 2024-04-07 LAB — LIPID PANEL
Chol/HDL Ratio: 4.4 ratio (ref 0.0–5.0)
Cholesterol, Total: 140 mg/dL (ref 100–199)
HDL: 32 mg/dL — ABNORMAL LOW (ref >39)
LDL Chol Calc (NIH): 79 mg/dL (ref 0–99)
Triglycerides: 166 mg/dL — ABNORMAL HIGH (ref 0–149)
VLDL Cholesterol Cal: 29 mg/dL (ref 5–40)

## 2024-04-07 LAB — HEMOGLOBIN A1C
Est. average glucose Bld gHb Est-mCnc: 154 mg/dL
Hgb A1c MFr Bld: 7 % — ABNORMAL HIGH (ref 4.8–5.6)

## 2024-04-07 MED ORDER — TRAZODONE HCL 50 MG PO TABS: 25.0000 mg | ORAL_TABLET | Freq: Every evening | ORAL | 3 refills | Status: DC | PRN

## 2024-04-07 NOTE — Assessment & Plan Note (Signed)
Well controlled.  No changes to medicines. Continue crestor 40 mg before bed and coenzyme q10 daily.  Continue to work on eating a healthy diet and exercise.  Labs drawn today.

## 2024-04-07 NOTE — Assessment & Plan Note (Signed)
 Well controlled.  No changes to medicines. Continue on metoprolol tartrate 50 mg three times a day, torsemide 20 mg 2 po twice daily, metolazone 5 mg once a wk as needed swelling. and potassium 20 meq two twice daily.  Continue to work on eating a healthy diet and exercise.  Labs drawn today.

## 2024-04-07 NOTE — Assessment & Plan Note (Signed)
 Control: good Recommend check sugars fasting daily. Recommend check feet daily. Recommend annual eye exams. Medicines: Continue : farxiga 10 mg daily, tresiba 60 U daily.Taking ozempic 1 mg weekly.    Continue to work on eating a healthy diet and exercise.  Labs drawn today.

## 2024-04-07 NOTE — Assessment & Plan Note (Signed)
 Stable.  Continue Breztri  2 puffs twice daily. Using duoneb treatments/albuterol  hfa once a day a most. Also on singulair  and xyzal prn

## 2024-04-07 NOTE — Assessment & Plan Note (Signed)
 Continue amiodarone 200 mg daily, metoprolol 50 mg three times a day and eliquis 5 mg twice daily. Issue with cost of eliquis. Refer to pharmacy.

## 2024-04-07 NOTE — Assessment & Plan Note (Signed)
Management per specialist. Treating for chronic pain.

## 2024-04-07 NOTE — Assessment & Plan Note (Signed)
 Continue cpap.

## 2024-04-09 ENCOUNTER — Encounter: Payer: Self-pay | Admitting: Family Medicine

## 2024-04-12 ENCOUNTER — Encounter: Payer: Self-pay | Admitting: Family Medicine

## 2024-04-14 ENCOUNTER — Telehealth: Payer: Self-pay

## 2024-04-14 NOTE — Telephone Encounter (Signed)
 I left message on voicemail to let him know that he has patient assistant for Tresiba  and ozempic  at the office. Also called his daughter Carmelina Chinchilla day.

## 2024-04-17 DIAGNOSIS — E1122 Type 2 diabetes mellitus with diabetic chronic kidney disease: Secondary | ICD-10-CM | POA: Diagnosis not present

## 2024-04-17 DIAGNOSIS — R600 Localized edema: Secondary | ICD-10-CM | POA: Diagnosis not present

## 2024-04-17 DIAGNOSIS — N1832 Chronic kidney disease, stage 3b: Secondary | ICD-10-CM | POA: Diagnosis not present

## 2024-04-17 DIAGNOSIS — E669 Obesity, unspecified: Secondary | ICD-10-CM | POA: Diagnosis not present

## 2024-04-17 DIAGNOSIS — E559 Vitamin D deficiency, unspecified: Secondary | ICD-10-CM | POA: Diagnosis not present

## 2024-04-17 DIAGNOSIS — D631 Anemia in chronic kidney disease: Secondary | ICD-10-CM | POA: Diagnosis not present

## 2024-04-17 DIAGNOSIS — E876 Hypokalemia: Secondary | ICD-10-CM | POA: Diagnosis not present

## 2024-04-17 DIAGNOSIS — I129 Hypertensive chronic kidney disease with stage 1 through stage 4 chronic kidney disease, or unspecified chronic kidney disease: Secondary | ICD-10-CM | POA: Diagnosis not present

## 2024-04-17 DIAGNOSIS — N2581 Secondary hyperparathyroidism of renal origin: Secondary | ICD-10-CM | POA: Diagnosis not present

## 2024-04-19 DIAGNOSIS — G894 Chronic pain syndrome: Secondary | ICD-10-CM | POA: Diagnosis not present

## 2024-04-19 DIAGNOSIS — M961 Postlaminectomy syndrome, not elsewhere classified: Secondary | ICD-10-CM | POA: Diagnosis not present

## 2024-04-19 DIAGNOSIS — M533 Sacrococcygeal disorders, not elsewhere classified: Secondary | ICD-10-CM | POA: Diagnosis not present

## 2024-04-19 DIAGNOSIS — Z79899 Other long term (current) drug therapy: Secondary | ICD-10-CM | POA: Diagnosis not present

## 2024-04-20 ENCOUNTER — Other Ambulatory Visit: Payer: Self-pay | Admitting: Cardiology

## 2024-04-25 DIAGNOSIS — R1900 Intra-abdominal and pelvic swelling, mass and lump, unspecified site: Secondary | ICD-10-CM | POA: Diagnosis not present

## 2024-04-25 DIAGNOSIS — C786 Secondary malignant neoplasm of retroperitoneum and peritoneum: Secondary | ICD-10-CM | POA: Diagnosis not present

## 2024-04-25 DIAGNOSIS — C499 Malignant neoplasm of connective and soft tissue, unspecified: Secondary | ICD-10-CM | POA: Diagnosis not present

## 2024-05-02 NOTE — Telephone Encounter (Signed)
 Patient's daughter Carmelina Chinchilla came into the office and picked up the Tresiba  and Ozempic . Carmelina Chinchilla signed off that the medications were picked up. Paper work placed in [employer]

## 2024-05-08 ENCOUNTER — Other Ambulatory Visit: Payer: Self-pay

## 2024-05-08 MED ORDER — BREZTRI AEROSPHERE 160-9-4.8 MCG/ACT IN AERO: 2.0000 | INHALATION_SPRAY | Freq: Two times a day (BID) | RESPIRATORY_TRACT | 3 refills | Status: AC

## 2024-05-08 MED ORDER — DAPAGLIFLOZIN PROPANEDIOL 10 MG PO TABS: 10.0000 mg | ORAL_TABLET | Freq: Every day | ORAL | 3 refills | Status: AC

## 2024-05-10 DIAGNOSIS — C499 Malignant neoplasm of connective and soft tissue, unspecified: Secondary | ICD-10-CM | POA: Diagnosis not present

## 2024-05-10 DIAGNOSIS — C786 Secondary malignant neoplasm of retroperitoneum and peritoneum: Secondary | ICD-10-CM | POA: Diagnosis not present

## 2024-05-10 DIAGNOSIS — K862 Cyst of pancreas: Secondary | ICD-10-CM | POA: Diagnosis not present

## 2024-05-10 DIAGNOSIS — R911 Solitary pulmonary nodule: Secondary | ICD-10-CM | POA: Diagnosis not present

## 2024-05-10 DIAGNOSIS — R918 Other nonspecific abnormal finding of lung field: Secondary | ICD-10-CM | POA: Diagnosis not present

## 2024-05-10 DIAGNOSIS — N281 Cyst of kidney, acquired: Secondary | ICD-10-CM | POA: Diagnosis not present

## 2024-05-10 DIAGNOSIS — R160 Hepatomegaly, not elsewhere classified: Secondary | ICD-10-CM | POA: Diagnosis not present

## 2024-05-10 DIAGNOSIS — K8020 Calculus of gallbladder without cholecystitis without obstruction: Secondary | ICD-10-CM | POA: Diagnosis not present

## 2024-05-16 ENCOUNTER — Ambulatory Visit: Payer: Self-pay | Admitting: Family Medicine

## 2024-05-16 ENCOUNTER — Ambulatory Visit (INDEPENDENT_AMBULATORY_CARE_PROVIDER_SITE_OTHER): Admitting: Family Medicine

## 2024-05-16 ENCOUNTER — Ambulatory Visit (HOSPITAL_BASED_OUTPATIENT_CLINIC_OR_DEPARTMENT_OTHER)
Admission: RE | Admit: 2024-05-16 | Discharge: 2024-05-16 | Disposition: A | Discharge status: Home / Self Care | Source: Ambulatory Visit | Attending: Family Medicine | Admitting: Family Medicine

## 2024-05-16 VITALS — BP 118/70 | HR 91 | Temp 98.0°F | Ht 73.0 in | Wt 308.0 lb

## 2024-05-16 DIAGNOSIS — J441 Chronic obstructive pulmonary disease with (acute) exacerbation: Secondary | ICD-10-CM | POA: Diagnosis not present

## 2024-05-16 DIAGNOSIS — E1142 Type 2 diabetes mellitus with diabetic polyneuropathy: Secondary | ICD-10-CM

## 2024-05-16 DIAGNOSIS — R0602 Shortness of breath: Secondary | ICD-10-CM | POA: Diagnosis not present

## 2024-05-16 DIAGNOSIS — I700 Atherosclerosis of aorta: Secondary | ICD-10-CM | POA: Diagnosis not present

## 2024-05-16 DIAGNOSIS — J9811 Atelectasis: Secondary | ICD-10-CM | POA: Diagnosis not present

## 2024-05-16 LAB — POCT GLUCOSE FINGERSTICK: Glucose: 162 — AB (ref 70–99)

## 2024-05-16 MED ORDER — AMOXICILLIN-POT CLAVULANATE 875-125 MG PO TABS: 1.0000 | ORAL_TABLET | Freq: Two times a day (BID) | ORAL | 0 refills | Status: DC

## 2024-05-16 MED ORDER — NOVOLOG FLEXPEN 100 UNIT/ML ~~LOC~~ SOPN: PEN_INJECTOR | SUBCUTANEOUS | 2 refills | Status: DC

## 2024-05-16 MED ORDER — DOXYCYCLINE HYCLATE 100 MG PO TABS: 100.0000 mg | ORAL_TABLET | Freq: Two times a day (BID) | ORAL | 0 refills | Status: DC

## 2024-05-16 MED ORDER — CEFTRIAXONE SODIUM 1 G IJ SOLR
1.0000 g | Freq: Once | INTRAMUSCULAR | Status: AC
  Administered 2024-05-16: 1 g via INTRAMUSCULAR

## 2024-05-16 MED ORDER — PREDNISONE 50 MG PO TABS: 50.0000 mg | ORAL_TABLET | Freq: Every day | ORAL | 0 refills | Status: DC

## 2024-05-16 MED ORDER — TRIAMCINOLONE ACETONIDE 40 MG/ML IJ SUSP
80.0000 mg | Freq: Once | INTRAMUSCULAR | Status: AC
  Administered 2024-05-16: 80 mg via INTRAMUSCULAR

## 2024-05-16 NOTE — Progress Notes (Deleted)
 Subjective:  Patient ID: Shawn Sullivan, male    DOB: 04/06/1946  Age: 78 y.o. MRN: [id number]  No chief complaint on file.   HPI:     12/20/2023   11:07 AM 08/12/2023    2:08 PM 02/15/2023   11:23 AM 11/09/2022   11:16 AM 08/20/2022   12:01 PM  Depression screen PHQ 2/9  Decreased Interest 1 0 0 0 0  Down, Depressed, Hopeless 0 0 0 0 0  PHQ - 2 Score 1 0 0 0 0  Altered sleeping 3  0  3  Tired, decreased energy 3  0  3  Change in appetite 0  0  3  Feeling bad or failure about yourself  0  0  0  Trouble concentrating 0  0  0  Moving slowly or fidgety/restless 0  0  0  Suicidal thoughts 0  0  0  PHQ-9 Score 7  0  9  Difficult doing work/chores Not difficult at all  Not difficult at all  Somewhat difficult        08/12/2023    2:12 PM  Fall Risk   Falls in the past year? 0  Number falls in past yr: 0  Injury with Fall? 0  Risk for fall due to : No Fall Risks  Follow up Falls prevention discussed    Patient Care Team: Mercy Stall, MD as PCP - General (Family Medicine) Devon Fogo, [facility] (Inactive) (Pharmacist) Hassan Links, MD as Consulting Physician (Cardiology) Moishe Angel, PA-C as Physician Assistant (Physician Assistant) Allie Ivanoff, MD as Referring Physician (Dermatology) Clementine Cutting, MD as Consulting Physician (Nephrology) Annella Barrows, RN as Oncology Nurse Navigator (Medical Oncology)   Review of Systems  Current Outpatient Medications on File Prior to Visit  Medication Sig Dispense Refill   acetaminophen  (TYLENOL ) 500 MG tablet Take 1,000 mg by mouth every 6 (six) hours as needed for moderate pain or headache.     amiodarone  (PACERONE ) 200 MG tablet TAKE 1 TABLET BY MOUTH EACH EVENING 90 tablet 0   apixaban  (ELIQUIS ) 5 MG TABS tablet Take 1 tablet (5 mg total) by mouth 2 (two) times daily. 28 tablet 0   budesonide-glycopyrrolate-formoterol  (BREZTRI  AEROSPHERE) 160-9-4.8 MCG/ACT AERO inhaler Inhale 2 puffs into the lungs 2  (two) times daily. 10.7 g 3   cholecalciferol (VITAMIN D3) 25 MCG (1000 UT) tablet Take 1,000 Units by mouth daily.      dapagliflozin  propanediol (FARXIGA ) 10 MG TABS tablet Take 1 tablet (10 mg total) by mouth daily before breakfast. 90 tablet 3   HYDROcodone-acetaminophen  (NORCO) 10-325 MG tablet Take 1-2 tablets by mouth 2 (two) times daily as needed for moderate pain (pain score 4-6).     icosapent  Ethyl (VASCEPA ) 1 g capsule Take 2 capsules (2 g total) by mouth 2 (two) times daily. (Patient taking differently: Take 1 g by mouth 2 (two) times daily.) 120 capsule 2   insulin  degludec (TRESIBA  FLEXTOUCH) 100 UNIT/ML FlexTouch Pen Inject 60 Units into the skin every evening.     ipratropium-albuterol  (DUONEB) 0.5-2.5 (3) MG/3ML SOLN INHALE CONTENTS OF 1 VIAL VIA NEBULIZER EVERY 6 HOURS AS NEEDED FOR SHORTNESS OF BREATH 360 mL 10   levocetirizine (XYZAL) 5 MG tablet Take 5 mg by mouth daily as needed for allergies.     metolazone  (ZAROXOLYN ) 5 MG tablet Take 5 mg by mouth once a week. May take an additional 5 mg tablet for swelling if needed.  metoprolol  tartrate (LOPRESSOR ) 50 MG tablet TAKE 1 TABLET BY MOUTH EVERY MORNING, TAKE 1 TABLET IN THE EVENING AND TAKE 1 TABLET AT BEDTIME 90 tablet 6   montelukast  (SINGULAIR ) 10 MG tablet TAKE 1 TABLET BY MOUTH EVERY DAY AT BEDTIME 30 tablet 10   nystatin  (MYCOSTATIN /NYSTOP ) powder Apply 1 Application topically 3 (three) times daily. (Patient not taking: Reported on 03/20/2024) 30 g 1   Omeprazole 20 MG TBEC Take 20 mg by mouth 2 (two) times daily before a meal.      potassium chloride  SA (KLOR-CON  M) 20 MEQ tablet TAKE 2 TABLETS BY MOUTH AT BREAKFAST AND AT BEDTIME 120 tablet 10   rOPINIRole  (REQUIP ) 1 MG tablet Take 1 tablet (1 mg total) by mouth at bedtime. (Patient taking differently: Take 1 mg by mouth at bedtime as needed (restless legs).) 30 tablet 2   rosuvastatin  (CRESTOR ) 40 MG tablet TAKE 1 TABLET BY MOUTH EVERY DAY AT BEDTIME 30 tablet 10    Semaglutide , 1 MG/DOSE, (OZEMPIC , 1 MG/DOSE,) 2 MG/1.5ML SOPN Inject 1 mg into the skin once a week. 9 mL 3   sertraline  (ZOLOFT ) 50 MG tablet TAKE 1 TABLET BY MOUTH AT BREAKFAST AND AT BEDTIME 60 tablet 10   tamsulosin  (FLOMAX ) 0.4 MG CAPS capsule TAKE 1 CAPSULE BY MOUTH EVERY DAY AT BEDTIME 30 capsule 10   tiZANidine  (ZANAFLEX ) 2 MG tablet TAKE 1 TABLET BY MOUTH EVERY DAY AT BEDTIME 30 tablet 10   torsemide  (DEMADEX ) 20 MG tablet TAKE TWO (2) TABLETS BY MOUTH EACH MORNING AND TAKE 2 TABLETS AT NOON 120 tablet 10   traZODone  (DESYREL ) 50 MG tablet Take 0.5-1 tablets (25-50 mg total) by mouth at bedtime as needed for sleep. 30 tablet 3   VENTOLIN  HFA 108 (90 Base) MCG/ACT inhaler INHALE 2 PUFFS BY MOUTH EVERY 4 HOURS AS NEEDED FOR SHORTNESS OF BREATH AND WHEEZING 18 g 10   No current facility-administered medications on file prior to visit.   Past Medical History:  Diagnosis Date   Acquired thrombophilia (HCC) 12/24/2021   Acute cough 08/23/2023   Acute recurrent frontal sinusitis 08/23/2023   Anticoagulated 07/01/2018   Anticoagulated 07/01/2018   Aortic atherosclerosis (HCC) 02/06/2024   Atrial fibrillation, chronic (HCC) 05/31/2020   Benign essential hypertension 05/06/2013   Bilateral pseudophakia 12/16/2015   Cellulitis of scrotum 02/06/2024   Chronic combined systolic and diastolic congestive heart failure (HCC) 02/06/2024   Chronic obstructive pulmonary disease (HCC) 11/05/2015   Chronic pain syndrome 08/30/2017   CKD stage 3b, GFR 30-44 ml/min (HCC) 07/01/2018   Class 3 severe obesity due to excess calories with serious comorbidity and body mass index (BMI) of 40.0 to 44.9 in adult 10/04/2017   COPD (chronic obstructive pulmonary disease) (HCC) 07/01/2018   Depressive disorder 05/06/2013   Dermatochalasis 12/16/2015   Diabetic polyneuropathy (HCC) 02/23/2020   Disorder of bursae and tendons in shoulder region 01/05/2014   Edema 11/04/2015   Formatting of this note might be  different from the original.  proBNP is low, 83, Echo wo findings of heart failure     Encounter for immunization 09/12/2023   Encounter for screening for pneumonia 05/06/2023   Gastroesophageal reflux disease without esophagitis 02/23/2020   Generalized edema 06/22/2016   Hyperbilirubinemia 02/06/2024   Hyperlipidemia 12/16/2015   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 stage 3b chronic kidney disease (HCC) 06/14/2019   Hypertensive heart disease 07/01/2018   Low back pain 08/17/2013   Lumbosacral  spondylosis 05/06/2013   Mild protein malnutrition (HCC) 02/06/2024   Obstructive sleep apnea 07/01/2018   On amiodarone  therapy 03/24/2019   PAF (paroxysmal atrial fibrillation) (HCC) 07/01/2018   Persistent atrial fibrillation (HCC) 07/01/2018   Piriformis syndrome 05/03/2014   Postlaminectomy syndrome, lumbar region 05/06/2013   Presbyopia of both eyes 12/16/2015   Restless legs syndrome 03/23/2016   Restrictive lung disease 06/22/2016   Retroperitoneal mass 02/06/2024   Seasonal allergic rhinitis due to pollen 09/12/2023   SI (sacroiliac) pain 08/12/2020   Sleep apnea 05/06/2013   Splenomegaly 02/06/2024   Status post intraocular lens implant 12/16/2015   Thrombocytopenia (HCC) 02/06/2024   Type 2 diabetes mellitus without complications (HCC) 11/05/2015   Uncomplicated opioid dependence (HCC) 12/24/2021   Past Surgical History:  Procedure Laterality Date   APPENDECTOMY     BACK SURGERY     CARDIOVERSION N/A 12/01/2018   Procedure: CARDIOVERSION;  Surgeon: Luana Rumple, MD;  Location: [facility];  Service: Cardiovascular;  Laterality: N/A;   CATARACT EXTRACTION Bilateral    LUMBAR FUSION     NECK SURGERY      Family History  Problem Relation Age of Onset   Atrial fibrillation Mother    Cancer Mother        vaginal cancer   Heart disease Father    Social History   Socioeconomic History   Marital  status: Married    Spouse name: Not on file   Number of children: Not on file   Years of education: Not on file   Highest education level: Not on file  Occupational History   Not on file  Tobacco Use   Smoking status: Former    Current packs/day: 0.00    Types: Cigarettes    Quit date: 1993    Years since quitting: 32.4   Smokeless tobacco: Never  Vaping Use   Vaping status: Never Used  Substance and Sexual Activity   Alcohol use: Not Currently   Drug use: Not Currently    Types: Hydrocodone   Sexual activity: Not on file  Other Topics Concern   Not on file  Social History Narrative   Not on file   Social Drivers of Health   Financial Resource Strain: Low Risk  (08/12/2023)   Overall Financial Resource Strain (CARDIA)    Difficulty of Paying Living Expenses: Not hard at all  Food Insecurity: No Food Insecurity (02/06/2024)   Hunger Vital Sign    Worried About Running Out of Food in the Last Year: Never true    Ran Out of Food in the Last Year: Never true  Transportation Needs: No Transportation Needs (02/06/2024)   PRAPARE - Administrator, Civil Service (Medical): No    Lack of Transportation (Non-Medical): No  Physical Activity: Inactive (08/12/2023)   Exercise Vital Sign    Days of Exercise per Week: 0 days    Minutes of Exercise per Session: 0 min  Stress: No Stress Concern Present (08/12/2023)   [employer] of Occupational Health - Occupational Stress Questionnaire    Feeling of Stress : Not at all  Social Connections: Socially Integrated (02/06/2024)   Social Connection and Isolation Panel    Frequency of Communication with Friends and Family: Three times a week    Frequency of Social Gatherings with Friends and Family: Once a week    Attends Religious Services: 1 to 4 times per year    Active Member of [employer] or Organizations: Yes    Attends Club or  Organization Meetings: 1 to 4 times per year    Marital Status: Married    Objective:  There were  no vitals taken for this visit.     04/07/2024   11:13 AM 03/20/2024    2:54 PM 03/20/2024    2:45 PM  BP/Weight  Systolic BP 112 151 144  Diastolic BP 70 100 97  Wt. (Lbs) 322    BMI 42.48 kg/m2      Physical Exam  {Perform Simple Foot Exam  Perform Detailed exam:1} {Insert foot Exam (Optional):30965}   Lab Results  Component Value Date   WBC 9.0 04/07/2024   HGB 15.6 04/07/2024   HCT 48.8 04/07/2024   PLT 199 04/07/2024   GLUCOSE 119 (H) 04/07/2024   CHOL 140 04/07/2024   TRIG 166 (H) 04/07/2024   HDL 32 (L) 04/07/2024   LDLCALC 79 04/07/2024   ALT 21 04/07/2024   AST 32 04/07/2024   NA 143 04/07/2024   K 3.7 04/07/2024   CL 99 04/07/2024   CREATININE 1.62 (H) 04/07/2024   BUN 23 04/07/2024   CO2 25 04/07/2024   TSH 3.790 11/09/2022   INR 1.0 03/20/2024   HGBA1C 7.0 (H) 04/07/2024   MICROALBUR 80 01/14/2021      Assessment & Plan:  There are no diagnoses linked to this encounter.   No orders of the defined types were placed in this encounter.   No orders of the defined types were placed in this encounter.    Follow-up: No follow-ups on file.   I,Marla I Leal-Borjas,acting as a scribe for Mercy Stall, MD.,have documented all relevant documentation on the behalf of Mercy Stall, MD,as directed by  Mercy Stall, MD while in the presence of Mercy Stall, MD.   An After Visit Summary was printed and given to the patient.  Mercy Stall, MD [facility] [phone]

## 2024-05-16 NOTE — Patient Instructions (Signed)
 Go to get chest xray at the [facility] [address], [place], [place] [address] [phone]  IMAGING 8-5 PM MONDAY Bro[place]ax.    Start on prednisone  50 mg daily x 5 days. Given augmentin  and doxycycline  for treatment of possible pneumonia.  Use albuterol  nebulizer 4 times a day for the next 2 days and then four times a day as needed shortness of breath/wheezing.  Continue breztri .   Recommend add novolog  sliding scale before meals.   Recommend preprandial (before meals) novolog  insulin      Check sugars before meals Start on 6 U prior to breakfast, lunch, and supper.  + 2 u if sugar between 201-250 + 4 U if sugar between 251-300 + 6 U if sugar between 301-350 + 8 U if sugar between 351-400 + 10 U if sugar > 400.

## 2024-05-16 NOTE — Progress Notes (Unsigned)
 Acute Office Visit  Subjective:    Patient ID: Shawn Sullivan, male    DOB: 08/21/1946, 78 y.o.   MRN: [id number]  Chief Complaint  Patient presents with   URI    HPI: Patient is in today for sinus pain and pressure, coughing and some shob has been using breathing txs more than usual, Symptoms started 1 week ago. No fevers, chills. On breztri  2 puffs twice daily, albuterol  nebulizer machine about 3 time a day.   Diabetes: sugars: 150-400. Improved over the last couple of weeks. On farxiga  10 mg daily,  Increased tresiba  to 70 U over the last few weeks.  on ozempic  1 mg weekly.     Past Medical History:  Diagnosis Date   Acquired thrombophilia (HCC) 12/24/2021   Acute cough 08/23/2023   Acute recurrent frontal sinusitis 08/23/2023   Anticoagulated 07/01/2018   Anticoagulated 07/01/2018   Aortic atherosclerosis (HCC) 02/06/2024   Atrial fibrillation, chronic (HCC) 05/31/2020   Benign essential hypertension 05/06/2013   Bilateral pseudophakia 12/16/2015   Cellulitis of scrotum 02/06/2024   Chronic combined systolic and diastolic congestive heart failure (HCC) 02/06/2024   Chronic obstructive pulmonary disease (HCC) 11/05/2015   Chronic pain syndrome 08/30/2017   CKD stage 3b, GFR 30-44 ml/min (HCC) 07/01/2018   Class 3 severe obesity due to excess calories with serious comorbidity and body mass index (BMI) of 40.0 to 44.9 in adult 10/04/2017   COPD (chronic obstructive pulmonary disease) (HCC) 07/01/2018   Depressive disorder 05/06/2013   Dermatochalasis 12/16/2015   Diabetic polyneuropathy (HCC) 02/23/2020   Disorder of bursae and tendons in shoulder region 01/05/2014   Edema 11/04/2015   Formatting of this note might be different from the original.  proBNP is low, 83, Echo wo findings of heart failure     Encounter for immunization 09/12/2023   Encounter for screening for pneumonia 05/06/2023   Gastroesophageal reflux disease without esophagitis 02/23/2020    Generalized edema 06/22/2016   Hyperbilirubinemia 02/06/2024   Hyperlipidemia 12/16/2015   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 stage 3b chronic kidney disease (HCC) 06/14/2019   Hypertensive heart disease 07/01/2018   Low back pain 08/17/2013   Lumbosacral spondylosis 05/06/2013   Mild protein malnutrition (HCC) 02/06/2024   Obstructive sleep apnea 07/01/2018   On amiodarone  therapy 03/24/2019   PAF (paroxysmal atrial fibrillation) (HCC) 07/01/2018   Persistent atrial fibrillation (HCC) 07/01/2018   Piriformis syndrome 05/03/2014   Postlaminectomy syndrome, lumbar region 05/06/2013   Presbyopia of both eyes 12/16/2015   Restless legs syndrome 03/23/2016   Restrictive lung disease 06/22/2016   Retroperitoneal mass 02/06/2024   Seasonal allergic rhinitis due to pollen 09/12/2023   SI (sacroiliac) pain 08/12/2020   Sleep apnea 05/06/2013   Splenomegaly 02/06/2024   Status post intraocular lens implant 12/16/2015   Thrombocytopenia (HCC) 02/06/2024   Type 2 diabetes mellitus without complications (HCC) 11/05/2015   Uncomplicated opioid dependence (HCC) 12/24/2021    Past Surgical History:  Procedure Laterality Date   APPENDECTOMY     BACK SURGERY     CARDIOVERSION N/A 12/01/2018   Procedure: CARDIOVERSION;  Surgeon: Francyne Headland, MD;  Location: [facility];  Service: Cardiovascular;  Laterality: N/A;   CATARACT EXTRACTION Bilateral    LUMBAR FUSION     NECK SURGERY      Family History  Problem Relation Age of Onset   Atrial fibrillation Mother    Cancer Mother  vaginal cancer   Heart disease Father     Social History   Socioeconomic History   Marital status: Married    Spouse name: Not on file   Number of children: Not on file   Years of education: Not on file   Highest education level: Not on file  Occupational History   Not on file  Tobacco Use   Smoking status: Former    Current  packs/day: 0.00    Types: Cigarettes    Quit date: 74    Years since quitting: 32.4   Smokeless tobacco: Never  Vaping Use   Vaping status: Never Used  Substance and Sexual Activity   Alcohol use: Not Currently   Drug use: Not Currently    Types: Hydrocodone   Sexual activity: Not on file  Other Topics Concern   Not on file  Social History Narrative   Not on file   Social Drivers of Health   Financial Resource Strain: Low Risk  (08/12/2023)   Overall Financial Resource Strain (CARDIA)    Difficulty of Paying Living Expenses: Not hard at all  Food Insecurity: No Food Insecurity (02/06/2024)   Hunger Vital Sign    Worried About Running Out of Food in the Last Year: Never true    Ran Out of Food in the Last Year: Never true  Transportation Needs: No Transportation Needs (02/06/2024)   PRAPARE - Administrator, Civil Service (Medical): No    Lack of Transportation (Non-Medical): No  Physical Activity: Inactive (08/12/2023)   Exercise Vital Sign    Days of Exercise per Week: 0 days    Minutes of Exercise per Session: 0 min  Stress: No Stress Concern Present (08/12/2023)   [employer] of Occupational Health - Occupational Stress Questionnaire    Feeling of Stress : Not at all  Social Connections: Socially Integrated (02/06/2024)   Social Connection and Isolation Panel    Frequency of Communication with Friends and Family: Three times a week    Frequency of Social Gatherings with Friends and Family: Once a week    Attends Religious Services: 1 to 4 times per year    Active Member of [employer] or Organizations: Yes    Attends Banker Meetings: 1 to 4 times per year    Marital Status: Married  Catering manager Violence: Not At Risk (02/06/2024)   Humiliation, Afraid, Rape, and Kick questionnaire    Fear of Current or Ex-Partner: No    Emotionally Abused: No    Physically Abused: No    Sexually Abused: No    Outpatient Medications Prior to Visit   Medication Sig Dispense Refill   acetaminophen  (TYLENOL ) 500 MG tablet Take 1,000 mg by mouth every 6 (six) hours as needed for moderate pain or headache.     amiodarone  (PACERONE ) 200 MG tablet TAKE 1 TABLET BY MOUTH EACH EVENING 90 tablet 0   apixaban  (ELIQUIS ) 5 MG TABS tablet Take 1 tablet (5 mg total) by mouth 2 (two) times daily. 28 tablet 0   budesonide-glycopyrrolate-formoterol  (BREZTRI  AEROSPHERE) 160-9-4.8 MCG/ACT AERO inhaler Inhale 2 puffs into the lungs 2 (two) times daily. 10.7 g 3   cholecalciferol (VITAMIN D3) 25 MCG (1000 UT) tablet Take 1,000 Units by mouth daily.      dapagliflozin  propanediol (FARXIGA ) 10 MG TABS tablet Take 1 tablet (10 mg total) by mouth daily before breakfast. 90 tablet 3   HYDROcodone-acetaminophen  (NORCO) 10-325 MG tablet Take 1-2 tablets by mouth 2 (two) times  daily as needed for moderate pain (pain score 4-6).     icosapent  Ethyl (VASCEPA ) 1 g capsule Take 2 capsules (2 g total) by mouth 2 (two) times daily. (Patient taking differently: Take 1 g by mouth 2 (two) times daily.) 120 capsule 2   insulin  degludec (TRESIBA  FLEXTOUCH) 100 UNIT/ML FlexTouch Pen Inject 60 Units into the skin every evening.     ipratropium-albuterol  (DUONEB) 0.5-2.5 (3) MG/3ML SOLN INHALE CONTENTS OF 1 VIAL VIA NEBULIZER EVERY 6 HOURS AS NEEDED FOR SHORTNESS OF BREATH 360 mL 10   levocetirizine (XYZAL) 5 MG tablet Take 5 mg by mouth daily as needed for allergies.     metolazone  (ZAROXOLYN ) 5 MG tablet Take 5 mg by mouth once a week. May take an additional 5 mg tablet for swelling if needed.     metoprolol  tartrate (LOPRESSOR ) 50 MG tablet TAKE 1 TABLET BY MOUTH EVERY MORNING, TAKE 1 TABLET IN THE EVENING AND TAKE 1 TABLET AT BEDTIME 90 tablet 6   montelukast  (SINGULAIR ) 10 MG tablet TAKE 1 TABLET BY MOUTH EVERY DAY AT BEDTIME 30 tablet 10   nystatin  (MYCOSTATIN /NYSTOP ) powder Apply 1 Application topically 3 (three) times daily. 30 g 1   Omeprazole 20 MG TBEC Take 20 mg by mouth 2  (two) times daily before a meal.      potassium chloride  SA (KLOR-CON  M) 20 MEQ tablet TAKE 2 TABLETS BY MOUTH AT BREAKFAST AND AT BEDTIME 120 tablet 10   rOPINIRole  (REQUIP ) 1 MG tablet Take 1 tablet (1 mg total) by mouth at bedtime. (Patient taking differently: Take 1 mg by mouth at bedtime as needed (restless legs).) 30 tablet 2   rosuvastatin  (CRESTOR ) 40 MG tablet TAKE 1 TABLET BY MOUTH EVERY DAY AT BEDTIME 30 tablet 10   Semaglutide , 1 MG/DOSE, (OZEMPIC , 1 MG/DOSE,) 2 MG/1.5ML SOPN Inject 1 mg into the skin once a week. 9 mL 3   sertraline  (ZOLOFT ) 50 MG tablet TAKE 1 TABLET BY MOUTH AT BREAKFAST AND AT BEDTIME 60 tablet 10   tamsulosin  (FLOMAX ) 0.4 MG CAPS capsule TAKE 1 CAPSULE BY MOUTH EVERY DAY AT BEDTIME 30 capsule 10   tiZANidine  (ZANAFLEX ) 2 MG tablet TAKE 1 TABLET BY MOUTH EVERY DAY AT BEDTIME 30 tablet 10   torsemide  (DEMADEX ) 20 MG tablet TAKE TWO (2) TABLETS BY MOUTH EACH MORNING AND TAKE 2 TABLETS AT NOON 120 tablet 10   traZODone  (DESYREL ) 50 MG tablet Take 0.5-1 tablets (25-50 mg total) by mouth at bedtime as needed for sleep. 30 tablet 3   VENTOLIN  HFA 108 (90 Base) MCG/ACT inhaler INHALE 2 PUFFS BY MOUTH EVERY 4 HOURS AS NEEDED FOR SHORTNESS OF BREATH AND WHEEZING 18 g 10   No facility-administered medications prior to visit.    Allergies  Allergen Reactions   Androgel [Testosterone] Other (See Comments)    Pedal edema    Biaxin [Clarithromycin] Other (See Comments)    Taste perception alteration   Duloxetine Other (See Comments)    Hallucinations   Gabapentin Other (See Comments)    hallucinations   Ibuprofen Other (See Comments)    GI upset    Lyrica [Pregabalin] Swelling   Spironolactone Other (See Comments)    Unknown     Review of Systems  Constitutional:  Negative for chills, fatigue and fever.  HENT:  Positive for sinus pressure and sinus pain. Negative for congestion, ear pain, postnasal drip, rhinorrhea and sore throat.   Respiratory:  Positive for  cough and shortness of breath.  Cardiovascular:  Negative for chest pain.  Neurological:  Negative for dizziness and headaches.       Objective:        05/16/2024   10:25 AM 04/07/2024   11:13 AM 03/20/2024    2:54 PM  Vitals with BMI  Height 6' 1 6' 1   Weight 308 lbs 322 lbs   BMI 40.64 42.49   Systolic 118 112 848  Diastolic 70 70 100  Pulse 91 103 89    No data found.   Physical Exam Vitals reviewed.  Constitutional:      Appearance: Normal appearance. He is obese.  HENT:     Right Ear: Tympanic membrane normal.     Left Ear: Tympanic membrane normal.     Nose: Nose normal.     Mouth/Throat:     Pharynx: No oropharyngeal exudate or posterior oropharyngeal erythema.   Cardiovascular:     Rate and Rhythm: Normal rate and regular rhythm.     Heart sounds: Normal heart sounds.  Pulmonary:     Effort: Pulmonary effort is normal.     Breath sounds: Wheezing and rhonchi present. No rales.  Abdominal:     General: Bowel sounds are normal.   Neurological:     Mental Status: He is alert.   Psychiatric:        Mood and Affect: Mood normal.        Behavior: Behavior normal.     Health Maintenance Due  Topic Date Due   OPHTHALMOLOGY EXAM  Never done   DTaP/Tdap/Td (1 - Tdap) Never done   Zoster Vaccines- Shingrix (2 of 2) 04/18/2023   COVID-19 Vaccine (5 - 2024-25 season) 08/01/2023    There are no preventive care reminders to display for this patient.   Lab Results  Component Value Date   TSH 3.790 11/09/2022   Lab Results  Component Value Date   WBC 9.0 04/07/2024   HGB 15.6 04/07/2024   HCT 48.8 04/07/2024   MCV 93 04/07/2024   PLT 199 04/07/2024   Lab Results  Component Value Date   NA 143 04/07/2024   K 3.7 04/07/2024   CO2 25 04/07/2024   GLUCOSE 162 (A) 05/16/2024   BUN 23 04/07/2024   CREATININE 1.62 (H) 04/07/2024   BILITOT 1.3 (H) 04/07/2024   ALKPHOS 144 (H) 04/07/2024   AST 32 04/07/2024   ALT 21 04/07/2024   PROT 6.4  04/07/2024   ALBUMIN 3.9 04/07/2024   CALCIUM  9.7 04/07/2024   ANIONGAP 7 02/29/2024   EGFR 43 (L) 04/07/2024   Lab Results  Component Value Date   CHOL 140 04/07/2024   Lab Results  Component Value Date   HDL 32 (L) 04/07/2024   Lab Results  Component Value Date   LDLCALC 79 04/07/2024   Lab Results  Component Value Date   TRIG 166 (H) 04/07/2024   Lab Results  Component Value Date   CHOLHDL 4.4 04/07/2024   Lab Results  Component Value Date   HGBA1C 7.0 (H) 04/07/2024       Assessment & Plan:  COPD with exacerbation (HCC) Assessment & Plan: Ordered chest xray Given Rocephin  1 g IM #1 and Kenalog  80 mg IM # 1 at the office Start on prednisone  50 mg daily x 5 days. Given augmentin  and doxycycline  for treatment of possible pneumonia.  Use albuterol  nebulizer 4 times a day for the next 2 days and then four times a day as needed shortness of breath/wheezing.  Continue  breztri .     Orders: -     cefTRIAXone  Sodium -     Triamcinolone  Acetonide -     Amoxicillin -Pot Clavulanate; Take 1 tablet by mouth 2 (two) times daily.  Dispense: 14 tablet; Refill: 0 -     Doxycycline  Hyclate; Take 1 tablet (100 mg total) by mouth 2 (two) times daily.  Dispense: 14 tablet; Refill: 0 -     predniSONE ; Take 1 tablet (50 mg total) by mouth daily with breakfast.  Dispense: 5 tablet; Refill: 0 -     DG Chest 2 View; Future  Diabetic polyneuropathy associated with type 2 diabetes mellitus (HCC) Assessment & Plan: Recommend add novolog  sliding scale before meals.   Recommend preprandial (before meals) novolog  insulin      Check sugars before meals Start on 6 U prior to breakfast, lunch, and supper.  + 2 u if sugar between 201-250 + 4 U if sugar between 251-300 + 6 U if sugar between 301-350 + 8 U if sugar between 351-400 + 10 U if sugar > 400.    Orders: -     POCT Glucose Fingerstick     Meds ordered this encounter  Medications   cefTRIAXone  (ROCEPHIN ) injection 1 g    triamcinolone  acetonide (KENALOG -40) injection 80 mg   amoxicillin -clavulanate (AUGMENTIN ) 875-125 MG tablet    Sig: Take 1 tablet by mouth 2 (two) times daily.    Dispense:  14 tablet    Refill:  0   doxycycline  (VIBRA -TABS) 100 MG tablet    Sig: Take 1 tablet (100 mg total) by mouth 2 (two) times daily.    Dispense:  14 tablet    Refill:  0   predniSONE  (DELTASONE ) 50 MG tablet    Sig: Take 1 tablet (50 mg total) by mouth daily with breakfast.    Dispense:  5 tablet    Refill:  0   DISCONTD: insulin  aspart (NOVOLOG  FLEXPEN) 100 UNIT/ML FlexPen    Sig: Recommend increase preprandial (before meals) insulin  novolog  Check sugars before meals Take 6 U prior to breakfast, lunch, and supper. + 1 u if sugar is greater than 200. + 2 u if sugar between 201-250 + 3 U if sugar between 251-300 + 4 U if sugar between 301-350 + 5 U if sugar between 351-400 + 6 U if sugar > 400.    Dispense:  15 mL    Refill:  2    Orders Placed This Encounter  Procedures   DG Chest 2 View   POCT Glucose Fingerstick     Follow-up: Return in about 6 weeks (around 06/27/2024) for chronic follow up and sooner if not improving. SABRA  An After Visit Summary was printed and given to the patient.   LILLETTE Kato I Leal-Borjas,acting as a scribe for Abigail Free, MD.,have documented all relevant documentation on the behalf of Abigail Free, MD,as directed by  Abigail Free, MD while in the presence of Abigail Free, MD.   I attest that I have reviewed this visit and agree with the plan scribed by my staff.   Abigail Free, MD [facility] [phone]

## 2024-05-17 ENCOUNTER — Other Ambulatory Visit: Payer: Self-pay | Admitting: Family Medicine

## 2024-05-17 DIAGNOSIS — J441 Chronic obstructive pulmonary disease with (acute) exacerbation: Secondary | ICD-10-CM

## 2024-05-17 MED ORDER — NOVOLOG FLEXPEN 100 UNIT/ML ~~LOC~~ SOPN: PEN_INJECTOR | SUBCUTANEOUS | 2 refills | Status: AC

## 2024-05-20 DIAGNOSIS — J441 Chronic obstructive pulmonary disease with (acute) exacerbation: Secondary | ICD-10-CM

## 2024-05-20 HISTORY — DX: Chronic obstructive pulmonary disease with (acute) exacerbation: J44.1

## 2024-05-20 NOTE — Assessment & Plan Note (Signed)
 Recommend add novolog  sliding scale before meals.   Recommend preprandial (before meals) novolog  insulin      Check sugars before meals Start on 6 U prior to breakfast, lunch, and supper.  + 2 u if sugar between 201-250 + 4 U if sugar between 251-300 + 6 U if sugar between 301-350 + 8 U if sugar between 351-400 + 10 U if sugar > 400.

## 2024-05-20 NOTE — Assessment & Plan Note (Addendum)
 Ordered chest xray Given Rocephin  1 g IM #1 and Kenalog  80 mg IM # 1 at the office Start on prednisone  50 mg daily x 5 days. Given augmentin  and doxycycline  for treatment of possible pneumonia.  Use albuterol  nebulizer 4 times a day for the next 2 days and then four times a day as needed shortness of breath/wheezing.  Continue breztri .

## 2024-05-21 ENCOUNTER — Other Ambulatory Visit: Payer: Self-pay | Admitting: Family Medicine

## 2024-05-21 ENCOUNTER — Encounter: Payer: Self-pay | Admitting: Family Medicine

## 2024-05-24 ENCOUNTER — Other Ambulatory Visit: Payer: Self-pay | Admitting: Family Medicine

## 2024-05-25 NOTE — Telephone Encounter (Signed)
 Copied from CRM [id number]. Topic: Clinical - Medication Question >> May 25, 2024 12:19 PM Carlatta H wrote: Reason for CRM: Medication request for cough and cold sores//Please call Dorthea at [phone]   ----------------------------------------------------------------------- From previous Reason for Contact - Medication Refill: Medication:   Has the patient contacted their pharmacy?   (Agent: If no, request that the patient contact the pharmacy for the refill. If patient does not wish to contact the pharmacy document the reason why and proceed with request.) (Agent: If yes, when and what did the pharmacy advise?)  This is the patient's preferred pharmacy:  [facility] - [place], [place] - [address] [place] [address] Phone: [phone] Fax: [phone]  [facility] [address] [place], [place] - [address] [place] [address] Phone: [phone] Fax: [phone]  [facility] - [address] [place] - [address] [place] [address] Phone: [phone] Fax: [phone]  [facility] [place]  - [place], [place] - [address] [place] [address] Phone: [phone] Fax: [phone]  [facility] - [place], [place] - [address] [place] [address] Phone: [phone] Fax: [phone]  Is this the correct pharmacy for this prescription?   If no, delete pharmacy and type the correct one.   Has the prescription been filled recently?    Is the patient out of the medication?    Has the patient been seen for an appointment in the last year OR does the patient have an upcoming appointment?    Can we respond through MyChart?    Agent: Please be advised that Rx refills may take up to 3 business days. We ask that you follow-up with your pharmacy.

## 2024-05-26 ENCOUNTER — Other Ambulatory Visit: Payer: Self-pay

## 2024-05-26 MED ORDER — BENZONATATE 200 MG PO CAPS: 200.0000 mg | ORAL_CAPSULE | Freq: Three times a day (TID) | ORAL | 0 refills | Status: DC | PRN

## 2024-05-26 MED ORDER — VALACYCLOVIR HCL 500 MG PO TABS: 500.0000 mg | ORAL_TABLET | Freq: Three times a day (TID) | ORAL | 0 refills | Status: DC

## 2024-06-06 ENCOUNTER — Encounter: Payer: Self-pay | Admitting: Medical Oncology

## 2024-06-06 NOTE — Progress Notes (Signed)
 [facility]  Outgoing call to patient's daughter for follow-up  and workup regarding Shawn Sullivan and [facility] referral. Daughter states that patient had his CT and MRI at [facility] and that she needs to set up an appointment with Dr. Debora for follow-up for imaging. Daughter states that her mom, patient's spouse, had an aneurism and that she has been taking care of them both. Daughter did request that I cancel patient's appointment with Dr. Federico for 06/12/24 for now. Daughter denies having any questions at this time. I thanked her for the update and encouraged her to call me with any questions or concerns she may have.   Shawn KYM Raider, RN, BSN, [facility] Oncology Nurse Navigator, [facility] 06/06/2024 4:02 PM

## 2024-06-12 ENCOUNTER — Ambulatory Visit: Admitting: Hematology and Oncology

## 2024-06-12 ENCOUNTER — Other Ambulatory Visit

## 2024-06-15 ENCOUNTER — Encounter: Payer: Self-pay | Admitting: Oncology

## 2024-06-25 ENCOUNTER — Other Ambulatory Visit: Payer: Self-pay | Admitting: Cardiology

## 2024-07-18 ENCOUNTER — Ambulatory Visit: Admitting: Family Medicine

## 2024-07-20 ENCOUNTER — Other Ambulatory Visit: Payer: Self-pay | Admitting: Cardiology

## 2024-07-22 ENCOUNTER — Emergency Department (HOSPITAL_BASED_OUTPATIENT_CLINIC_OR_DEPARTMENT_OTHER)

## 2024-07-22 ENCOUNTER — Encounter (HOSPITAL_BASED_OUTPATIENT_CLINIC_OR_DEPARTMENT_OTHER): Payer: Self-pay | Admitting: Emergency Medicine

## 2024-07-22 ENCOUNTER — Emergency Department (HOSPITAL_BASED_OUTPATIENT_CLINIC_OR_DEPARTMENT_OTHER): Admission: EM | Admit: 2024-07-22 | Discharge: 2024-07-22 | Disposition: A | Discharge status: Home / Self Care

## 2024-07-22 DIAGNOSIS — G9389 Other specified disorders of brain: Secondary | ICD-10-CM | POA: Diagnosis not present

## 2024-07-22 DIAGNOSIS — R5383 Other fatigue: Secondary | ICD-10-CM | POA: Diagnosis present

## 2024-07-22 DIAGNOSIS — I700 Atherosclerosis of aorta: Secondary | ICD-10-CM | POA: Diagnosis not present

## 2024-07-22 DIAGNOSIS — Z794 Long term (current) use of insulin: Secondary | ICD-10-CM | POA: Diagnosis not present

## 2024-07-22 DIAGNOSIS — R4182 Altered mental status, unspecified: Secondary | ICD-10-CM | POA: Diagnosis not present

## 2024-07-22 DIAGNOSIS — Z7901 Long term (current) use of anticoagulants: Secondary | ICD-10-CM | POA: Diagnosis not present

## 2024-07-22 DIAGNOSIS — I6523 Occlusion and stenosis of bilateral carotid arteries: Secondary | ICD-10-CM | POA: Diagnosis not present

## 2024-07-22 DIAGNOSIS — Z79899 Other long term (current) drug therapy: Secondary | ICD-10-CM | POA: Diagnosis not present

## 2024-07-22 DIAGNOSIS — E876 Hypokalemia: Secondary | ICD-10-CM | POA: Diagnosis not present

## 2024-07-22 DIAGNOSIS — R000 Tachycardia, unspecified: Secondary | ICD-10-CM | POA: Diagnosis not present

## 2024-07-22 DIAGNOSIS — I6789 Other cerebrovascular disease: Secondary | ICD-10-CM | POA: Insufficient documentation

## 2024-07-22 LAB — COMPREHENSIVE METABOLIC PANEL WITH GFR
ALT: 28 U/L (ref 0–44)
AST: 38 U/L (ref 15–41)
Albumin: 4 g/dL (ref 3.5–5.0)
Alkaline Phosphatase: 111 U/L (ref 38–126)
Anion gap: 14 (ref 5–15)
BUN: 34 mg/dL — ABNORMAL HIGH (ref 8–23)
CO2: 34 mmol/L — ABNORMAL HIGH (ref 22–32)
Calcium: 10.1 mg/dL (ref 8.9–10.3)
Chloride: 93 mmol/L — ABNORMAL LOW (ref 98–111)
Creatinine, Ser: 2 mg/dL — ABNORMAL HIGH (ref 0.61–1.24)
GFR, Estimated: 33 mL/min — ABNORMAL LOW (ref >60)
Glucose, Bld: 137 mg/dL — ABNORMAL HIGH (ref 70–99)
Potassium: 2.5 mmol/L — CL (ref 3.5–5.1)
Sodium: 140 mmol/L (ref 135–145)
Total Bilirubin: 2.2 mg/dL — ABNORMAL HIGH (ref 0.0–1.2)
Total Protein: 6.7 g/dL (ref 6.5–8.1)

## 2024-07-22 LAB — CBG MONITORING, ED: Glucose-Capillary: 158 mg/dL — ABNORMAL HIGH (ref 70–99)

## 2024-07-22 LAB — I-STAT VENOUS BLOOD GAS, ED
Acid-Base Excess: 18 mmol/L — ABNORMAL HIGH (ref 0.0–2.0)
Bicarbonate: 41.8 mmol/L — ABNORMAL HIGH (ref 20.0–28.0)
Calcium, Ion: 1.03 mmol/L — ABNORMAL LOW (ref 1.15–1.40)
HCT: 49 % (ref 39.0–52.0)
Hemoglobin: 16.7 g/dL (ref 13.0–17.0)
O2 Saturation: 99 %
Patient temperature: 98.7
Potassium: 2.9 mmol/L — ABNORMAL LOW (ref 3.5–5.1)
Sodium: 137 mmol/L (ref 135–145)
TCO2: 43 mmol/L — ABNORMAL HIGH (ref 22–32)
pCO2, Ven: 42.4 mmHg — ABNORMAL LOW (ref 44–60)
pH, Ven: 7.603 (ref 7.25–7.43)
pO2, Ven: 128 mmHg — ABNORMAL HIGH (ref 32–45)

## 2024-07-22 LAB — URINALYSIS, MICROSCOPIC (REFLEX)

## 2024-07-22 LAB — URINALYSIS, ROUTINE W REFLEX MICROSCOPIC
Bilirubin Urine: NEGATIVE
Glucose, UA: >=500 mg/dL — AB
Hgb urine dipstick: NEGATIVE
Ketones, ur: NEGATIVE mg/dL
Nitrite: NEGATIVE
Protein, ur: NEGATIVE mg/dL
Specific Gravity, Urine: 1.01 (ref 1.005–1.030)
pH: 6.5 (ref 5.0–8.0)

## 2024-07-22 LAB — URINE DRUG SCREEN
Amphetamines: NOT DETECTED
Barbiturates: NOT DETECTED
Benzodiazepines: NOT DETECTED
Cocaine: NOT DETECTED
Fentanyl: NOT DETECTED
Methadone Scn, Ur: NOT DETECTED
Opiates: DETECTED — AB
Tetrahydrocannabinol: NOT DETECTED

## 2024-07-22 LAB — CBC WITH DIFFERENTIAL/PLATELET
Abs Immature Granulocytes: 0.03 K/uL (ref 0.00–0.07)
Basophils Absolute: 0.1 K/uL (ref 0.0–0.1)
Basophils Relative: 0 %
Eosinophils Absolute: 0.2 K/uL (ref 0.0–0.5)
Eosinophils Relative: 2 %
HCT: 49.5 % (ref 39.0–52.0)
Hemoglobin: 16.3 g/dL (ref 13.0–17.0)
Immature Granulocytes: 0 %
Lymphocytes Relative: 17 %
Lymphs Abs: 2 K/uL (ref 0.7–4.0)
MCH: 30.6 pg (ref 26.0–34.0)
MCHC: 32.9 g/dL (ref 30.0–36.0)
MCV: 93 fL (ref 80.0–100.0)
Monocytes Absolute: 0.9 K/uL (ref 0.1–1.0)
Monocytes Relative: 8 %
Neutro Abs: 8.5 K/uL — ABNORMAL HIGH (ref 1.7–7.7)
Neutrophils Relative %: 73 %
Platelets: 144 K/uL — ABNORMAL LOW (ref 150–400)
RBC: 5.32 MIL/uL (ref 4.22–5.81)
RDW: 14.8 % (ref 11.5–15.5)
WBC: 11.7 K/uL — ABNORMAL HIGH (ref 4.0–10.5)
nRBC: 0 % (ref 0.0–0.2)

## 2024-07-22 LAB — MAGNESIUM: Magnesium: 2 mg/dL (ref 1.7–2.4)

## 2024-07-22 LAB — ETHANOL: Alcohol, Ethyl (B): <15 mg/dL (ref <15)

## 2024-07-22 MED ORDER — POTASSIUM CHLORIDE CRYS ER 20 MEQ PO TBCR
40.0000 meq | EXTENDED_RELEASE_TABLET | Freq: Once | ORAL | Status: AC
  Administered 2024-07-22: 40 meq via ORAL
  Filled 2024-07-22: qty 2

## 2024-07-22 MED ORDER — POTASSIUM CHLORIDE 10 MEQ/100ML IV SOLN
10.0000 meq | Freq: Once | INTRAVENOUS | Status: AC
  Administered 2024-07-22: 10 meq via INTRAVENOUS
  Filled 2024-07-22: qty 100

## 2024-07-22 MED ORDER — SODIUM CHLORIDE 0.9 % IV SOLN: Freq: Once | INTRAVENOUS | Status: AC

## 2024-07-22 NOTE — Discharge Instructions (Addendum)
 As we discussed, you can be discharged home but need to see your doctor early next week for recheck of your potassium and a repeat examination. If you develop ANY concerning symptoms prior to being seen by your doctor in follow up, please return to the ED.   You can increase your postassium supplement from 2 pills in the morning and 2 pills in the daytime to 3 in the morning and 2 in the evening for the next 4 days. Again, it is essential that your doctor reassess your health next week.

## 2024-07-22 NOTE — ED Notes (Signed)
 Safety socks Fall risk armband Fall risk sign on door

## 2024-07-22 NOTE — ED Notes (Signed)
Lab notified to add-on magnesium to previously collected blood  sample.   

## 2024-07-22 NOTE — ED Provider Notes (Signed)
 [place] [facility] AT [facility] Provider Note   CSN: [id number] Arrival date & time: 07/22/24  1431     Patient presents with: Altered Mental Status   Shawn Sullivan is a 78 y.o. male.   Patient to ED with daughter for evaluation of symptoms developing over the last 2-3 days of generalized weakness, fatigue, lack of energy and being out of it per daughter. No fever, cough. The patient denies chest pain, SOB. He has a couple days of nausea and vomiting 2 weeks ago but none since. Daughter reports brief period of diarrhea one week ago but no continuing symptoms. He feels some difficulty urinating, feeling that he has to urinate but little is produced. He has been eating and drinking. Of note, his wife of 62 years died about one month ago and appetite has been decreased since.   The history is provided by the patient and a relative. No language interpreter was used.  Altered Mental Status      Prior to Admission medications   Medication Sig Start Date End Date Taking? Authorizing Provider  acetaminophen  (TYLENOL ) 500 MG tablet Take 1,000 mg by mouth every 6 (six) hours as needed for moderate pain or headache.    [provider]  amiodarone  (PACERONE ) 200 MG tablet TAKE 1 TABLET BY MOUTH EACH EVENING 04/21/24   Monetta Redell PARAS, MD  amoxicillin -clavulanate (AUGMENTIN ) 875-125 MG tablet Take 1 tablet by mouth 2 (two) times daily. 05/16/24   CoxAbigail, MD  benzonatate  (TESSALON ) 200 MG capsule Take 1 capsule (200 mg total) by mouth 3 (three) times daily as needed for cough. 05/26/24   CoxAbigail, MD  budesonide-glycopyrrolate-formoterol  (BREZTRI  AEROSPHERE) 160-9-4.8 MCG/ACT AERO inhaler Inhale 2 puffs into the lungs 2 (two) times daily. 05/08/24   CoxAbigail, MD  cholecalciferol (VITAMIN D3) 25 MCG (1000 UT) tablet Take 1,000 Units by mouth daily.     [provider]  dapagliflozin  propanediol (FARXIGA ) 10 MG TABS tablet Take 1 tablet (10 mg  total) by mouth daily before breakfast. 05/08/24   Cox, Kirsten, MD  doxycycline  (VIBRA -TABS) 100 MG tablet Take 1 tablet (100 mg total) by mouth 2 (two) times daily. 05/16/24   Sherre Abigail, MD  ELIQUIS  5 MG TABS tablet Take 1 tablet by mouth twice daily 05/21/24   Cox, Kirsten, MD  HYDROcodone-acetaminophen  [facility]) 10-325 MG tablet Take 1-2 tablets by mouth 2 (two) times daily as needed for moderate pain (pain score 4-6). 05/17/17   [provider]  icosapent  Ethyl (VASCEPA ) 1 g capsule Take 2 capsules (2 g total) by mouth 2 (two) times daily. Patient taking differently: Take 1 g by mouth 2 (two) times daily. 09/09/23   CoxAbigail, MD  insulin  aspart (NOVOLOG  FLEXPEN) 100 UNIT/ML FlexPen Recommend increase preprandial (before meals) insulin  novolog  Check sugars before meals Take 6 U prior to breakfast, lunch, and supper. + 1 u if sugar 150-200. + 2 u if sugar between 201-250 + 3 U if sugar between 251-300 + 4 U if sugar between 301-350 + 5 U if sugar between 351-400 + 6 U if sugar > 400. 05/17/24   Cox, Kirsten, MD  insulin  degludec (TRESIBA  FLEXTOUCH) 100 UNIT/ML FlexTouch Pen Inject 60 Units into the skin every evening.    [provider]  ipratropium-albuterol  (DUONEB) 0.5-2.5 (3) MG/3ML SOLN INHALE CONTENTS OF 1 VIAL VIA NEBULIZER EVERY 6 HOURS AS NEEDED FOR SHORTNESS OF BREATH 02/23/24   CoxAbigail, MD  levocetirizine (XYZAL) 5 MG tablet  Take 5 mg by mouth daily as needed for allergies.    [provider]  metolazone  (ZAROXOLYN ) 5 MG tablet Take 5 mg by mouth once a week. May take an additional 5 mg tablet for swelling if needed. 04/26/20   [provider]  metoprolol  tartrate (LOPRESSOR ) 50 MG tablet TAKE 1 TABLET BY MOUTH EVERY MORNING , 1 TABLET IN THE EVENING AND 1 TABLET AT BEDTIME 06/27/24   Munley, Redell PARAS, MD  montelukast  (SINGULAIR ) 10 MG tablet TAKE 1 TABLET BY MOUTH EVERY DAY AT BEDTIME 01/24/24   Cox, Kirsten, MD  nystatin  (MYCOSTATIN /NYSTOP ) powder Apply  1 Application topically 3 (three) times daily. 02/15/24   Sherre Clapper, MD  Omeprazole 20 MG TBEC Take 20 mg by mouth 2 (two) times daily before a meal.  06/25/14   [provider]  potassium chloride  SA (KLOR-CON  M) 20 MEQ tablet TAKE 2 TABLETS BY MOUTH AT BREAKFAST AND AT BEDTIME 10/29/23   CoxClapper, MD  predniSONE  (DELTASONE ) 50 MG tablet Take 1 tablet (50 mg total) by mouth daily with breakfast. 05/16/24   Cox, Clapper, MD  rOPINIRole  (REQUIP ) 1 MG tablet Take 1 tablet (1 mg total) by mouth at bedtime. Patient taking differently: Take 1 mg by mouth at bedtime as needed (restless legs). 09/07/23   Cox, Clapper, MD  rosuvastatin  (CRESTOR ) 40 MG tablet TAKE 1 TABLET BY MOUTH EVERY DAY AT BEDTIME 10/29/23   Cox, Kirsten, MD  Semaglutide , 1 MG/DOSE, (OZEMPIC , 1 MG/DOSE,) 2 MG/1.5ML SOPN Inject 1 mg into the skin once a week. 03/24/23   Cox, Clapper, MD  sertraline  (ZOLOFT ) 50 MG tablet TAKE 1 TABLET BY MOUTH AT BREAKFAST AND AT BEDTIME 10/29/23   Cox, Kirsten, MD  tamsulosin  (FLOMAX ) 0.4 MG CAPS capsule TAKE 1 CAPSULE BY MOUTH EVERY DAY AT BEDTIME 01/24/24   Cox, Kirsten, MD  tiZANidine  (ZANAFLEX ) 2 MG tablet TAKE 1 TABLET BY MOUTH EVERY DAY AT BEDTIME 10/29/23   Cox, Kirsten, MD  torsemide  (DEMADEX ) 20 MG tablet TAKE TWO (2) TABLETS BY MOUTH EACH MORNING AND TAKE 2 TABLETS AT NOON 10/29/23   CoxClapper, MD  traZODone  (DESYREL ) 50 MG tablet Take 0.5-1 tablets (25-50 mg total) by mouth at bedtime as needed for sleep. 04/07/24   CoxClapper, MD  valACYclovir  (VALTREX ) 500 MG tablet Take 1 tablet (500 mg total) by mouth 3 (three) times daily. 05/26/24   Sherre Clapper, MD  VENTOLIN  HFA 108 [phone] Base) MCG/ACT inhaler INHALE 2 PUFFS BY MOUTH EVERY 6 HOURS AS NEEDED AS NEEDED FOR SHORTNESS OF BREATH/WHEEZING 05/25/24   Sherre Clapper, MD    Allergies: Androgel [testosterone], Biaxin [clarithromycin], Duloxetine, Gabapentin, Ibuprofen, Lyrica [pregabalin], and Spironolactone    Review of  Systems  Updated Vital Signs BP 130/75   Pulse 72   Temp 98.2 F (36.8 C)   Resp 15   Ht 6' 1 (1.854 m)   Wt (!) 139.7 kg   SpO2 94%   BMI 40.63 kg/m   Physical Exam Vitals and nursing note reviewed.  Constitutional:      General: He is not in acute distress.    Appearance: Normal appearance. He is obese.  HENT:     Head: Atraumatic.     Nose: Nose normal.     Mouth/Throat:     Mouth: Mucous membranes are moist.  Eyes:     Pupils: Pupils are equal, round, and reactive to light.  Cardiovascular:     Rate and Rhythm: Regular rhythm. Tachycardia present.  Pulses: Normal pulses.     Heart sounds: No murmur heard. Pulmonary:     Effort: Pulmonary effort is normal.     Breath sounds: No wheezing, rhonchi or rales.  Chest:     Chest wall: No tenderness.  Abdominal:     Comments: Abdomen is protuberant, diffusely tender to palpation. No guarding. Small, soft, nontender umbilical hernia.   Musculoskeletal:        General: Normal range of motion.     Cervical back: Normal range of motion and neck supple.  Skin:    General: Skin is warm and dry.  Neurological:     Mental Status: He is oriented to person, place, and time.     GCS: GCS eye subscore is 4. GCS verbal subscore is 5. GCS motor subscore is 6.     Cranial Nerves: No dysarthria.     Sensory: No sensory deficit.     Motor: No weakness.     (all labs ordered are listed, but only abnormal results are displayed) Labs Reviewed  CBC WITH DIFFERENTIAL/PLATELET - Abnormal; Notable for the following components:      Result Value   WBC 11.7 (*)    Platelets 144 (*)    Neutro Abs 8.5 (*)    All other components within normal limits  COMPREHENSIVE METABOLIC PANEL WITH GFR - Abnormal; Notable for the following components:   Potassium 2.5 (*)    Chloride 93 (*)    CO2 34 (*)    Glucose, Bld 137 (*)    BUN 34 (*)    Creatinine, Ser 2.00 (*)    Total Bilirubin 2.2 (*)    GFR, Estimated 33 (*)    All other  components within normal limits  URINALYSIS, ROUTINE W REFLEX MICROSCOPIC - Abnormal; Notable for the following components:   Glucose, UA >=500 (*)    Leukocytes,Ua SMALL (*)    All other components within normal limits  URINE DRUG SCREEN - Abnormal; Notable for the following components:   Opiates DETECTED (*)    All other components within normal limits  URINALYSIS, MICROSCOPIC (REFLEX) - Abnormal; Notable for the following components:   Bacteria, UA FEW (*)    All other components within normal limits  CBG MONITORING, ED - Abnormal; Notable for the following components:   Glucose-Capillary 158 (*)    All other components within normal limits  I-STAT VENOUS BLOOD GAS, ED - Abnormal; Notable for the following components:   pH, Ven 7.603 (*)    pCO2, Ven 42.4 (*)    pO2, Ven 128 (*)    Bicarbonate 41.8 (*)    TCO2 43 (*)    Acid-Base Excess 18.0 (*)    Potassium 2.9 (*)    Calcium , Ion 1.03 (*)    All other components within normal limits  ETHANOL  MAGNESIUM    Results for orders placed or performed during the hospital encounter of 07/22/24  CBC with Differential   Collection Time: 07/22/24  3:23 PM  Result Value Ref Range   WBC 11.7 (H) 4.0 - 10.5 K/uL   RBC 5.32 4.22 - 5.81 MIL/uL   Hemoglobin 16.3 13.0 - 17.0 g/dL   HCT 50.4 60.9 - 47.9 %   MCV 93.0 80.0 - 100.0 fL   MCH 30.6 26.0 - 34.0 pg   MCHC 32.9 30.0 - 36.0 g/dL   RDW 85.1 88.4 - 84.4 %   Platelets 144 (L) 150 - 400 K/uL   nRBC 0.0 0.0 - 0.2 %  Neutrophils Relative % 73 %   Neutro Abs 8.5 (H) 1.7 - 7.7 K/uL   Lymphocytes Relative 17 %   Lymphs Abs 2.0 0.7 - 4.0 K/uL   Monocytes Relative 8 %   Monocytes Absolute 0.9 0.1 - 1.0 K/uL   Eosinophils Relative 2 %   Eosinophils Absolute 0.2 0.0 - 0.5 K/uL   Basophils Relative 0 %   Basophils Absolute 0.1 0.0 - 0.1 K/uL   Immature Granulocytes 0 %   Abs Immature Granulocytes 0.03 0.00 - 0.07 K/uL  Comprehensive metabolic panel   Collection Time: 07/22/24  3:23 PM   Result Value Ref Range   Sodium 140 135 - 145 mmol/L   Potassium 2.5 (LL) 3.5 - 5.1 mmol/L   Chloride 93 (L) 98 - 111 mmol/L   CO2 34 (H) 22 - 32 mmol/L   Glucose, Bld 137 (H) 70 - 99 mg/dL   BUN 34 (H) 8 - 23 mg/dL   Creatinine, Ser 7.99 (H) 0.61 - 1.24 mg/dL   Calcium  10.1 8.9 - 10.3 mg/dL   Total Protein 6.7 6.5 - 8.1 g/dL   Albumin 4.0 3.5 - 5.0 g/dL   AST 38 15 - 41 U/L   ALT 28 0 - 44 U/L   Alkaline Phosphatase 111 38 - 126 U/L   Total Bilirubin 2.2 (H) 0.0 - 1.2 mg/dL   GFR, Estimated 33 (L) >60 mL/min   Anion gap 14 5 - 15  Ethanol   Collection Time: 07/22/24  3:23 PM  Result Value Ref Range   Alcohol, Ethyl (B) <15 <15 mg/dL  Magnesium    Collection Time: 07/22/24  3:23 PM  Result Value Ref Range   Magnesium  2.0 1.7 - 2.4 mg/dL  Urinalysis, Routine w reflex microscopic -Urine, Clean Catch   Collection Time: 07/22/24  3:27 PM  Result Value Ref Range   Color, Urine YELLOW YELLOW   APPearance CLEAR CLEAR   Specific Gravity, Urine 1.010 1.005 - 1.030   pH 6.5 5.0 - 8.0   Glucose, UA >=500 (A) NEGATIVE mg/dL   Hgb urine dipstick NEGATIVE NEGATIVE   Bilirubin Urine NEGATIVE NEGATIVE   Ketones, ur NEGATIVE NEGATIVE mg/dL   Protein, ur NEGATIVE NEGATIVE mg/dL   Nitrite NEGATIVE NEGATIVE   Leukocytes,Ua SMALL (A) NEGATIVE  Urine Drug Screen   Collection Time: 07/22/24  3:27 PM  Result Value Ref Range   Opiates DETECTED (A) NONE DETECTED   Cocaine NONE DETECTED NONE DETECTED   Benzodiazepines NONE DETECTED NONE DETECTED   Amphetamines NONE DETECTED NONE DETECTED   Tetrahydrocannabinol NONE DETECTED NONE DETECTED   Barbiturates NONE DETECTED NONE DETECTED   Methadone Scn, Ur NONE DETECTED NONE DETECTED   Fentanyl  NONE DETECTED NONE DETECTED  Urinalysis, Microscopic (reflex)   Collection Time: 07/22/24  3:27 PM  Result Value Ref Range   RBC / HPF 0-5 0 - 5 RBC/hpf   WBC, UA 11-20 0 - 5 WBC/hpf   Bacteria, UA FEW (A) NONE SEEN   Squamous Epithelial / HPF 0-5 0 - 5  /HPF  POC CBG, ED   Collection Time: 07/22/24  3:47 PM  Result Value Ref Range   Glucose-Capillary 158 (H) 70 - 99 mg/dL   Comment 1 Notify RN   I-Stat venous blood gas, ED   Collection Time: 07/22/24  4:19 PM  Result Value Ref Range   pH, Ven 7.603 (HH) 7.25 - 7.43   pCO2, Ven 42.4 (L) 44 - 60 mmHg   pO2, Ven 128 (H) 32 -  45 mmHg   Bicarbonate 41.8 (H) 20.0 - 28.0 mmol/L   TCO2 43 (H) 22 - 32 mmol/L   O2 Saturation 99 %   Acid-Base Excess 18.0 (H) 0.0 - 2.0 mmol/L   Sodium 137 135 - 145 mmol/L   Potassium 2.9 (L) 3.5 - 5.1 mmol/L   Calcium , Ion 1.03 (L) 1.15 - 1.40 mmol/L   HCT 49.0 39.0 - 52.0 %   Hemoglobin 16.7 13.0 - 17.0 g/dL   Patient temperature 01.2 F    Collection site Femoral    Drawn by HIDE    Sample type VENOUS     EKG: EKG Interpretation Date/Time:  Saturday July 22 2024 16:09:30 EDT Ventricular Rate:  102 PR Interval:    QRS Duration:  189 QT Interval:  406 QTC Calculation: 529 R Axis:   270  Text Interpretation: Atrial fibrillation Ventricular premature complex Nonspecific IVCD with LAD Abnormal T, consider ischemia, lateral leads Compared with prior EKG from 06/02/2023 Confirmed by Gennaro Bouchard ([id number]) on 07/22/2024 4:20:03 PM  Radiology: CT Head Wo Contrast Result Date: 07/22/2024 CLINICAL DATA:  Altered mental status. EXAM: CT HEAD WITHOUT CONTRAST TECHNIQUE: Contiguous axial images were obtained from the base of the skull through the vertex without intravenous contrast. RADIATION DOSE REDUCTION: This exam was performed according to the departmental dose-optimization program which includes automated exposure control, adjustment of the mA and/or kV according to patient size and/or use of iterative reconstruction technique. COMPARISON:  None Available. FINDINGS: Brain: There is generalized cerebral atrophy with widening of the extra-axial spaces and ventricular dilatation. There are areas of decreased attenuation within the white matter tracts of the  supratentorial brain, consistent with microvascular disease changes. Vascular: Marked severity bilateral cavernous carotid artery calcification is noted. Skull: Normal. Negative for fracture or focal lesion. Sinuses/Orbits: No acute finding. Other: None. IMPRESSION: 1. Generalized cerebral atrophy and microvascular disease changes of the supratentorial brain. 2. No acute intracranial abnormality. Electronically Signed   By: Suzen Dials M.D.   On: 07/22/2024 17:27   DG Chest 2 View Result Date: 07/22/2024 CLINICAL DATA:  Altered mental status. EXAM: DG CHEST 2V COMPARISON:  Chest radiograph dated 05/16/2024. FINDINGS: No focal consolidation, pleural effusion or pneumothorax. No cardiomegaly. Atherosclerotic calcification of the aorta. No acute osseous pathology. IMPRESSION: No active cardiopulmonary disease. Electronically Signed   By: Vanetta Chou M.D.   On: 07/22/2024 16:17     Procedures   Medications Ordered in the ED  potassium chloride  10 mEq in 100 mL IVPB (0 mEq Intravenous Stopped 07/22/24 1830)  potassium chloride  SA (KLOR-CON  M) CR tablet 40 mEq (40 mEq Oral Given 07/22/24 1711)  0.9 %  sodium chloride  infusion (0 mLs Intravenous Stopped 07/22/24 1831)    Clinical Course as of 07/22/24 1923  Sat Jul 22, 2024  1529 Patient BIB family for evaluation of what is described as progressive lethargy, generalized weakness and fatigue. No reported fevers. Not vomiting, is drinking plenty of water. Patient reports compliance with medications. No falls or injuries.  [SU]  1807 Labs show a significant hypokalemia of 2.5. IV and PO potassium ordered. His blood gas shows a metabolic alkalosis, unknown etiology. Reviewed with Dr. Gennaro.   On recheck, daughter reports they recently increased diuretics secondary to LE edema, which has now resolved. He did not increase his potassium supplement during that time. Likely the source of his low potassium  [SU]  1823 On recheck, the patient is  feeling better. Family reports he is more alert. His family brought  in food and he is hungry and eating. Potassium running, oral given. Discussed admission vs discharge home. The patient would like to discharge home. Discussed potassium supplementation at home and close PCP follow up, as well as strict return precautions.  [SU]    Clinical Course User Index [SU] Odell Balls, PA-C                                 Medical Decision Making Amount and/or Complexity of Data Reviewed Labs: ordered. Radiology: ordered. ECG/medicine tests: ordered.  Risk Prescription drug management.        Final diagnoses:  Hypokalemia    ED Discharge Orders     None          Odell Balls, PA-C 07/22/24 1923    Kammerer, Megan L, DO 07/23/24 1530

## 2024-07-22 NOTE — ED Notes (Signed)
 D/c paperwork reviewed with pt, including prescriptions and follow up care.  All questions and/or concerns addressed at time of d/c.  No further needs expressed. . Pt verbalized understanding, Wheeled by ED staff to ED exit, NAD.

## 2024-07-22 NOTE — ED Triage Notes (Signed)
 Pt and daughter report that he has been out of it and staggering around since yesterday; pt alert,  oriented to year, place and situation; c/o pain to back and legs

## 2024-08-01 DIAGNOSIS — M533 Sacrococcygeal disorders, not elsewhere classified: Secondary | ICD-10-CM | POA: Diagnosis not present

## 2024-08-01 DIAGNOSIS — G894 Chronic pain syndrome: Secondary | ICD-10-CM | POA: Diagnosis not present

## 2024-08-01 DIAGNOSIS — M961 Postlaminectomy syndrome, not elsewhere classified: Secondary | ICD-10-CM | POA: Diagnosis not present

## 2024-08-07 NOTE — Progress Notes (Deleted)
 Subjective:  Patient ID: Shawn Sullivan, male    DOB: 1946-05-13  Age: 78 y.o. MRN: [id number]  No chief complaint on file.   HPI: Discussed the use of AI scribe software for clinical note transcription with the patient, who gave verbal consent to proceed.  History of Present Illness        12/20/2023   11:07 AM 08/12/2023    2:08 PM 02/15/2023   11:23 AM 11/09/2022   11:16 AM 08/20/2022   12:01 PM  Depression screen PHQ 2/9  Decreased Interest 1 0 0 0 0  Down, Depressed, Hopeless 0 0 0 0 0  PHQ - 2 Score 1 0 0 0 0  Altered sleeping 3  0  3  Tired, decreased energy 3  0  3  Change in appetite 0  0  3  Feeling bad or failure about yourself  0  0  0  Trouble concentrating 0  0  0  Moving slowly or fidgety/restless 0  0  0  Suicidal thoughts 0  0  0  PHQ-9 Score 7  0  9  Difficult doing work/chores Not difficult at all  Not difficult at all  Somewhat difficult        08/12/2023    2:12 PM  Fall Risk   Falls in the past year? 0  Number falls in past yr: 0  Injury with Fall? 0  Risk for fall due to : No Fall Risks  Follow up Falls prevention discussed    Patient Care Team: Sherre Clapper, MD as PCP - General (Family Medicine) Nyle Rankin POUR, [facility] (Inactive) (Pharmacist) Monetta Redell PARAS, MD as Consulting Physician (Cardiology) Powell Bradley, PA-C as Physician Assistant (Physician Assistant) Trudy Lynwood DASEN, MD as Referring Physician (Dermatology) Dolan Mateo Larger, MD as Consulting Physician (Nephrology) Golden Forestine BROCKS, RN as Oncology Nurse Navigator (Medical Oncology)   Review of Systems  Current Outpatient Medications on File Prior to Visit  Medication Sig Dispense Refill   acetaminophen  (TYLENOL ) 500 MG tablet Take 1,000 mg by mouth every 6 (six) hours as needed for moderate pain or headache.     amiodarone  (PACERONE ) 200 MG tablet TAKE 1 TABLET BY MOUTH EACH EVENING. PLEASE CALL [phone] TO SCHEDULE AN OVERDUE APPOINTMENT PRIOR TO NEXT REFILL  REQUEST. THANK YOU. 1ST ATTEMPT 30 tablet 0   amoxicillin -clavulanate (AUGMENTIN ) 875-125 MG tablet Take 1 tablet by mouth 2 (two) times daily. 14 tablet 0   benzonatate  (TESSALON ) 200 MG capsule Take 1 capsule (200 mg total) by mouth 3 (three) times daily as needed for cough. 30 capsule 0   budesonide-glycopyrrolate-formoterol  (BREZTRI  AEROSPHERE) 160-9-4.8 MCG/ACT AERO inhaler Inhale 2 puffs into the lungs 2 (two) times daily. 10.7 g 3   cholecalciferol (VITAMIN D3) 25 MCG (1000 UT) tablet Take 1,000 Units by mouth daily.      dapagliflozin  propanediol (FARXIGA ) 10 MG TABS tablet Take 1 tablet (10 mg total) by mouth daily before breakfast. 90 tablet 3   doxycycline  (VIBRA -TABS) 100 MG tablet Take 1 tablet (100 mg total) by mouth 2 (two) times daily. 14 tablet 0   ELIQUIS  5 MG TABS tablet Take 1 tablet by mouth twice daily 180 tablet 0   HYDROcodone-acetaminophen  (NORCO) 10-325 MG tablet Take 1-2 tablets by mouth 2 (two) times daily as needed for moderate pain (pain score 4-6).     icosapent  Ethyl (VASCEPA ) 1 g capsule Take 2 capsules (2 g total) by mouth 2 (two) times daily. (Patient taking differently: Take  1 g by mouth 2 (two) times daily.) 120 capsule 2   insulin  aspart (NOVOLOG  FLEXPEN) 100 UNIT/ML FlexPen Recommend increase preprandial (before meals) insulin  novolog  Check sugars before meals Take 6 U prior to breakfast, lunch, and supper. + 1 u if sugar 150-200. + 2 u if sugar between 201-250 + 3 U if sugar between 251-300 + 4 U if sugar between 301-350 + 5 U if sugar between 351-400 + 6 U if sugar > 400. 15 mL 2   insulin  degludec (TRESIBA  FLEXTOUCH) 100 UNIT/ML FlexTouch Pen Inject 60 Units into the skin every evening.     ipratropium-albuterol  (DUONEB) 0.5-2.5 (3) MG/3ML SOLN INHALE CONTENTS OF 1 VIAL VIA NEBULIZER EVERY 6 HOURS AS NEEDED FOR SHORTNESS OF BREATH 360 mL 10   levocetirizine (XYZAL) 5 MG tablet Take 5 mg by mouth daily as needed for allergies.     metolazone  (ZAROXOLYN ) 5 MG  tablet Take 5 mg by mouth once a week. May take an additional 5 mg tablet for swelling if needed.     metoprolol  tartrate (LOPRESSOR ) 50 MG tablet TAKE 1 TABLET BY MOUTH EVERY MORNING , 1 TABLET IN THE EVENING AND 1 TABLET AT BEDTIME 90 tablet 1   montelukast  (SINGULAIR ) 10 MG tablet TAKE 1 TABLET BY MOUTH EVERY DAY AT BEDTIME 30 tablet 10   nystatin  (MYCOSTATIN /NYSTOP ) powder Apply 1 Application topically 3 (three) times daily. 30 g 1   Omeprazole 20 MG TBEC Take 20 mg by mouth 2 (two) times daily before a meal.      potassium chloride  SA (KLOR-CON  M) 20 MEQ tablet TAKE 2 TABLETS BY MOUTH AT BREAKFAST AND AT BEDTIME 120 tablet 10   predniSONE  (DELTASONE ) 50 MG tablet Take 1 tablet (50 mg total) by mouth daily with breakfast. 5 tablet 0   rOPINIRole  (REQUIP ) 1 MG tablet Take 1 tablet (1 mg total) by mouth at bedtime. (Patient taking differently: Take 1 mg by mouth at bedtime as needed (restless legs).) 30 tablet 2   rosuvastatin  (CRESTOR ) 40 MG tablet TAKE 1 TABLET BY MOUTH EVERY DAY AT BEDTIME 30 tablet 10   Semaglutide , 1 MG/DOSE, (OZEMPIC , 1 MG/DOSE,) 2 MG/1.5ML SOPN Inject 1 mg into the skin once a week. 9 mL 3   sertraline  (ZOLOFT ) 50 MG tablet TAKE 1 TABLET BY MOUTH AT BREAKFAST AND AT BEDTIME 60 tablet 10   tamsulosin  (FLOMAX ) 0.4 MG CAPS capsule TAKE 1 CAPSULE BY MOUTH EVERY DAY AT BEDTIME 30 capsule 10   tiZANidine  (ZANAFLEX ) 2 MG tablet TAKE 1 TABLET BY MOUTH EVERY DAY AT BEDTIME 30 tablet 10   torsemide  (DEMADEX ) 20 MG tablet TAKE TWO (2) TABLETS BY MOUTH EACH MORNING AND TAKE 2 TABLETS AT NOON 120 tablet 10   traZODone  (DESYREL ) 50 MG tablet Take 0.5-1 tablets (25-50 mg total) by mouth at bedtime as needed for sleep. 30 tablet 3   valACYclovir  (VALTREX ) 500 MG tablet Take 1 tablet (500 mg total) by mouth 3 (three) times daily. 21 tablet 0   VENTOLIN  HFA 108 (90 Base) MCG/ACT inhaler INHALE 2 PUFFS BY MOUTH EVERY 6 HOURS AS NEEDED AS NEEDED FOR SHORTNESS OF BREATH/WHEEZING 18 g 11   No  current facility-administered medications on file prior to visit.   Past Medical History:  Diagnosis Date   Acquired thrombophilia (HCC) 12/24/2021   Acute cough 08/23/2023   Acute recurrent frontal sinusitis 08/23/2023   Anticoagulated 07/01/2018   Anticoagulated 07/01/2018   Aortic atherosclerosis (HCC) 02/06/2024   Atrial fibrillation, chronic (HCC) 05/31/2020  Benign essential hypertension 05/06/2013   Bilateral pseudophakia 12/16/2015   Cellulitis of scrotum 02/06/2024   Chronic combined systolic and diastolic congestive heart failure (HCC) 02/06/2024   Chronic obstructive pulmonary disease (HCC) 11/05/2015   Chronic pain syndrome 08/30/2017   CKD stage 3b, GFR 30-44 ml/min (HCC) 07/01/2018   Class 3 severe obesity due to excess calories with serious comorbidity and body mass index (BMI) of 40.0 to 44.9 in adult 10/04/2017   COPD (chronic obstructive pulmonary disease) (HCC) 07/01/2018   Depressive disorder 05/06/2013   Dermatochalasis 12/16/2015   Diabetic polyneuropathy (HCC) 02/23/2020   Disorder of bursae and tendons in shoulder region 01/05/2014   Edema 11/04/2015   Formatting of this note might be different from the original.  proBNP is low, 83, Echo wo findings of heart failure     Encounter for immunization 09/12/2023   Encounter for screening for pneumonia 05/06/2023   Gastroesophageal reflux disease without esophagitis 02/23/2020   Generalized edema 06/22/2016   Hyperbilirubinemia 02/06/2024   Hyperlipidemia 12/16/2015   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 stage 3b chronic kidney disease (HCC) 06/14/2019   Hypertensive heart disease 07/01/2018   Low back pain 08/17/2013   Lumbosacral spondylosis 05/06/2013   Mild protein malnutrition (HCC) 02/06/2024   Obstructive sleep apnea 07/01/2018   On amiodarone  therapy 03/24/2019   PAF (paroxysmal atrial fibrillation) (HCC) 07/01/2018   Persistent  atrial fibrillation (HCC) 07/01/2018   Piriformis syndrome 05/03/2014   Postlaminectomy syndrome, lumbar region 05/06/2013   Presbyopia of both eyes 12/16/2015   Restless legs syndrome 03/23/2016   Restrictive lung disease 06/22/2016   Retroperitoneal mass 02/06/2024   Seasonal allergic rhinitis due to pollen 09/12/2023   SI (sacroiliac) pain 08/12/2020   Sleep apnea 05/06/2013   Splenomegaly 02/06/2024   Status post intraocular lens implant 12/16/2015   Thrombocytopenia (HCC) 02/06/2024   Type 2 diabetes mellitus without complications (HCC) 11/05/2015   Uncomplicated opioid dependence (HCC) 12/24/2021   Past Surgical History:  Procedure Laterality Date   APPENDECTOMY     BACK SURGERY     CARDIOVERSION N/A 12/01/2018   Procedure: CARDIOVERSION;  Surgeon: Francyne Headland, MD;  Location: [facility];  Service: Cardiovascular;  Laterality: N/A;   CATARACT EXTRACTION Bilateral    LUMBAR FUSION     NECK SURGERY      Family History  Problem Relation Age of Onset   Atrial fibrillation Mother    Cancer Mother        vaginal cancer   Heart disease Father    Social History   Socioeconomic History   Marital status: Married    Spouse name: Not on file   Number of children: Not on file   Years of education: Not on file   Highest education level: Not on file  Occupational History   Not on file  Tobacco Use   Smoking status: Former    Current packs/day: 0.00    Types: Cigarettes    Quit date: 1993    Years since quitting: 32.7   Smokeless tobacco: Never  Vaping Use   Vaping status: Never Used  Substance and Sexual Activity   Alcohol use: Not Currently   Drug use: Not Currently    Types: Hydrocodone   Sexual activity: Not on file  Other Topics Concern   Not on file  Social History Narrative   Not on file   Social Drivers of Health   Financial Resource Strain: Low Risk  (08/12/2023)  Overall Financial Resource Strain (CARDIA)    Difficulty of Paying Living Expenses:  Not hard at all  Food Insecurity: No Food Insecurity (02/06/2024)   Hunger Vital Sign    Worried About Running Out of Food in the Last Year: Never true    Ran Out of Food in the Last Year: Never true  Transportation Needs: No Transportation Needs (02/06/2024)   PRAPARE - Administrator, Civil Service (Medical): No    Lack of Transportation (Non-Medical): No  Physical Activity: Inactive (08/12/2023)   Exercise Vital Sign    Days of Exercise per Week: 0 days    Minutes of Exercise per Session: 0 min  Stress: No Stress Concern Present (08/12/2023)   [employer] of Occupational Health - Occupational Stress Questionnaire    Feeling of Stress : Not at all  Social Connections: Socially Integrated (02/06/2024)   Social Connection and Isolation Panel    Frequency of Communication with Friends and Family: Three times a week    Frequency of Social Gatherings with Friends and Family: Once a week    Attends Religious Services: 1 to 4 times per year    Active Member of [employer] or Organizations: Yes    Attends Banker Meetings: 1 to 4 times per year    Marital Status: Married    Objective:  There were no vitals taken for this visit.     07/22/2024    6:30 PM 07/22/2024    5:30 PM 07/22/2024    5:00 PM  BP/Weight  Systolic BP 130 119 135  Diastolic BP 75 78 78    Physical Exam  {Perform Simple Foot Exam  Perform Detailed exam:1} {Insert foot Exam (Optional):30965}   Lab Results  Component Value Date   WBC 11.7 (H) 07/22/2024   HGB 16.7 07/22/2024   HCT 49.0 07/22/2024   PLT 144 (L) 07/22/2024   GLUCOSE 137 (H) 07/22/2024   CHOL 140 04/07/2024   TRIG 166 (H) 04/07/2024   HDL 32 (L) 04/07/2024   LDLCALC 79 04/07/2024   ALT 28 07/22/2024   AST 38 07/22/2024   NA 137 07/22/2024   K 2.9 (L) 07/22/2024   CL 93 (L) 07/22/2024   CREATININE 2.00 (H) 07/22/2024   BUN 34 (H) 07/22/2024   CO2 34 (H) 07/22/2024   TSH 3.790 11/09/2022   INR 1.0 03/20/2024   HGBA1C  7.0 (H) 04/07/2024      Assessment & Plan:  There are no diagnoses linked to this encounter.   Assessment and Plan Assessment & Plan      No orders of the defined types were placed in this encounter.   No orders of the defined types were placed in this encounter.    Follow-up: No follow-ups on file.   I,Lennard Capek,acting as a Neurosurgeon for Abigail Free, MD.,have documented all relevant documentation on the behalf of Abigail Free, MD,as directed by  Abigail Free, MD while in the presence of Abigail Free, MD.   An After Visit Summary was printed and given to the patient.  Abigail Free, MD [facility] [phone]

## 2024-08-08 ENCOUNTER — Ambulatory Visit: Admitting: Family Medicine

## 2024-08-08 DIAGNOSIS — E782 Mixed hyperlipidemia: Secondary | ICD-10-CM

## 2024-08-08 DIAGNOSIS — I130 Hypertensive heart and chronic kidney disease with heart failure and stage 1 through stage 4 chronic kidney disease, or unspecified chronic kidney disease: Secondary | ICD-10-CM

## 2024-08-08 DIAGNOSIS — E1142 Type 2 diabetes mellitus with diabetic polyneuropathy: Secondary | ICD-10-CM

## 2024-08-15 ENCOUNTER — Ambulatory Visit (INDEPENDENT_AMBULATORY_CARE_PROVIDER_SITE_OTHER): Admitting: Family Medicine

## 2024-08-15 ENCOUNTER — Encounter: Payer: Self-pay | Admitting: Family Medicine

## 2024-08-15 VITALS — BP 138/76 | HR 92 | Temp 98.0°F | Ht 73.0 in | Wt 330.0 lb

## 2024-08-15 DIAGNOSIS — Z794 Long term (current) use of insulin: Secondary | ICD-10-CM

## 2024-08-15 DIAGNOSIS — I5041 Acute combined systolic (congestive) and diastolic (congestive) heart failure: Secondary | ICD-10-CM

## 2024-08-15 DIAGNOSIS — F332 Major depressive disorder, recurrent severe without psychotic features: Secondary | ICD-10-CM

## 2024-08-15 DIAGNOSIS — E1142 Type 2 diabetes mellitus with diabetic polyneuropathy: Secondary | ICD-10-CM | POA: Diagnosis not present

## 2024-08-15 DIAGNOSIS — F5101 Primary insomnia: Secondary | ICD-10-CM | POA: Diagnosis not present

## 2024-08-15 DIAGNOSIS — I130 Hypertensive heart and chronic kidney disease with heart failure and stage 1 through stage 4 chronic kidney disease, or unspecified chronic kidney disease: Secondary | ICD-10-CM | POA: Diagnosis not present

## 2024-08-15 DIAGNOSIS — N401 Enlarged prostate with lower urinary tract symptoms: Secondary | ICD-10-CM | POA: Diagnosis not present

## 2024-08-15 DIAGNOSIS — J410 Simple chronic bronchitis: Secondary | ICD-10-CM | POA: Diagnosis not present

## 2024-08-15 DIAGNOSIS — I480 Paroxysmal atrial fibrillation: Secondary | ICD-10-CM | POA: Diagnosis not present

## 2024-08-15 DIAGNOSIS — G894 Chronic pain syndrome: Secondary | ICD-10-CM | POA: Diagnosis not present

## 2024-08-15 DIAGNOSIS — E782 Mixed hyperlipidemia: Secondary | ICD-10-CM

## 2024-08-15 DIAGNOSIS — N1832 Chronic kidney disease, stage 3b: Secondary | ICD-10-CM

## 2024-08-15 LAB — POCT GLYCOSYLATED HEMOGLOBIN (HGB A1C): HbA1c POC (<> result, manual entry): 7.1 % (ref 4.0–5.6)

## 2024-08-15 LAB — POCT LIPID PANEL
HDL: 39
LDL: 77
Non-HDL: 103
TC: 142
TRG: 133

## 2024-08-15 MED ORDER — ESZOPICLONE 3 MG PO TABS: 3.0000 mg | ORAL_TABLET | Freq: Every day | ORAL | 2 refills | Status: DC

## 2024-08-15 MED ORDER — SERTRALINE HCL 100 MG PO TABS: 200.0000 mg | ORAL_TABLET | Freq: Every day | ORAL | 1 refills | Status: AC

## 2024-08-15 NOTE — Patient Instructions (Signed)
  VISIT SUMMARY: You visited today to discuss your depression following the recent loss of your wife, along with other health concerns including diabetes, atrial fibrillation, COPD, and sleep issues. We have made some adjustments to your medications and discussed follow-up plans.  YOUR PLAN: DEPRESSION: You are experiencing significant depression after the loss of your wife, with low mood, poor sleep, and overeating. -Increase Zoloft  to 200 mg daily. -Follow up in 6 weeks to assess response to medication adjustment.  INSOMNIA: You have been having trouble sleeping, and trazodone  has not been effective. -Discontinue trazodone . -Start Lunesta  for sleep. -Follow up in 6 weeks to assess response to new sleep medication.  TYPE 2 DIABETES MELLITUS WITH DIABETIC POLYNEUROPATHY AND INSULIN  USE: Your diabetes is well-controlled with your current regimen. -Continue current diabetes management regimen including Tresiba  70 units daily and Novolog  before largest meal. -Provide a sample of a continuous glucose monitor if available.  ATRIAL FIBRILLATION ON ANTICOAGULATION AND ANTIARRHYTHMIC THERAPY: Your atrial fibrillation is managed with amiodarone  and Eliquis . -Continue amiodarone  and Eliquis  as prescribed.  CHRONIC OBSTRUCTIVE PULMONARY DISEASE (COPD): Your COPD is managed with Breztri  and an albuterol  inhaler. -Continue Breztri  and albuterol  inhaler as prescribed.  HYPERLIPIDEMIA: Your cholesterol levels are well-controlled. -Continue current lipid management regimen.  BENIGN PROSTATIC HYPERPLASIA WITH LOWER URINARY TRACT SYMPTOMS: You have benign prostatic hyperplasia with nocturia, managed with Flomax . -Continue Flomax  as prescribed.  CHRONIC MUSCULOSKELETAL PAIN: You have chronic musculoskeletal pain managed with tizanidine  and pain medications from the pain clinic. -Continue tizanidine  and pain management regimen.  RESTLESS LEGS SYNDROME: Your restless legs syndrome is managed with  ropinirole , but it does not significantly aid sleep. -Continue ropinirole . -Consider alternative treatments if symptoms persist.  HYPOKALEMIA, RECURRENT: You have recurrent low potassium levels managed with potassium supplements. -Continue potassium supplementation, 4 tablets daily. -Monitor potassium levels regularly.  GENERAL HEALTH MAINTENANCE: You need a routine eye exam. - Call eye doctor for routine eye exam.                      Contains text generated by [employer]bridge.                                 Contains text generated by Abridge.

## 2024-08-15 NOTE — Progress Notes (Signed)
 Subjective:  Patient ID: Shawn Sullivan, male    DOB: 06/07/1946  Age: 78 y.o. MRN: [id number]  Chief Complaint  Patient presents with   Medical Management of Chronic Issues    HPI: Discussed the use of AI scribe software for clinical note transcription with the patient, who gave verbal consent to proceed.  History of Present Illness Shawn Sullivan is an 78 year old male who presents with depression following the recent death of his wife. He is accompanied by his son and daughter-in-law.  Depressive symptoms - Significant depression following the recent death of his wife of 62 years - Persistent low mood and lack of interest in activities nearly every day - Difficulty sleeping, with sleep onset around 1:00 to 3:00 AM - Chronic fatigue - Overeating daily, which is a change from initial poor appetite after his wife's passing - Passive thoughts of wanting to be with his wife, but no active suicidal ideation or plan; expresses he is 'waiting on the Lord' - Currently taking sertraline  (Zoloft ) 50 mg twice daily for depression - Son and daughter are providing support at home  Glycemic control and dietary habits - Diabetes managed with insulin : Tresiba  70 units daily and Novolog  before largest meal - Occasional blood glucose monitoring, most recent reading 118 mg/dL - Hemoglobin J8r 2.8% - Good glycemic control reported - Overeating almost every day  Cardiac rhythm disturbance and anticoagulation - Atrial fibrillation managed with amiodarone , metoprolol  50 mg three times daily, and apixaban  (Eliquis ) 5 mg twice daily - No recent falls - History of balance issues  Peripheral edema and diuretic use - Occasional lower extremity swelling - Takes torsemide  and metolazone  as needed for swelling - History of hospitalization due to hypokalemia after excessive torsemide  use - Currently taking potassium supplements four times daily  Sleep disturbance and restless legs - Restless  legs managed with ropinirole  (Requip ), but no improvement in sleep - Trazodone  used for sleep, but ineffective - Frequent nocturnal urination  Musculoskeletal pain - Neck pain attributed to poor posture while sitting - Tizanidine  used for muscle pain  COPD: Respiratory and allergic symptoms - Uses Breztri  for chronic lung issues - Albuterol  inhaler as needed - Singulair  for allergies - No current use of antibiotics - Denies recent infections  Otolaryngologic symptoms - Earache approximately one week ago  General constitutional symptoms - No recent fevers, chills, or sweats  Lipid management - Rosuvastatin  used for hyperlipidemia       08/15/2024    9:57 AM 12/20/2023   11:07 AM 08/12/2023    2:08 PM 02/15/2023   11:23 AM 11/09/2022   11:16 AM  Depression screen PHQ 2/9  Decreased Interest 3 1 0 0 0  Down, Depressed, Hopeless 3 0 0 0 0  PHQ - 2 Score 6 1 0 0 0  Altered sleeping 3 3  0   Tired, decreased energy 3 3  0   Change in appetite 3 0  0   Feeling bad or failure about yourself  1 0  0   Trouble concentrating 0 0  0   Moving slowly or fidgety/restless 1 0  0   Suicidal thoughts 1 0  0   PHQ-9 Score 18 7  0   Difficult doing work/chores  Not difficult at all  Not difficult at all         08/12/2023    2:12 PM  Fall Risk   Falls in the past year? 0  Number falls in past  yr: 0  Injury with Fall? 0  Risk for fall due to : No Fall Risks  Follow up Falls prevention discussed    Patient Care Team: Sherre Clapper, MD as PCP - General (Family Medicine) Nyle Rankin POUR, [facility] (Inactive) (Pharmacist) Monetta Redell PARAS, MD as Consulting Physician (Cardiology) Powell Bradley, PA-C as Physician Assistant (Physician Assistant) Trudy Lynwood DASEN, MD as Referring Physician (Dermatology) Dolan Mateo Larger, MD as Consulting Physician (Nephrology) Golden Forestine BROCKS, RN as Oncology Nurse Navigator (Medical Oncology)   Review of Systems  All other systems reviewed and  are negative.   Current Outpatient Medications on File Prior to Visit  Medication Sig Dispense Refill   acetaminophen  (TYLENOL ) 500 MG tablet Take 1,000 mg by mouth every 6 (six) hours as needed for moderate pain or headache.     amiodarone  (PACERONE ) 200 MG tablet TAKE 1 TABLET BY MOUTH EACH EVENING. PLEASE CALL [phone] TO SCHEDULE AN OVERDUE APPOINTMENT PRIOR TO NEXT REFILL REQUEST. THANK YOU. 1ST ATTEMPT 30 tablet 0   budesonide-glycopyrrolate-formoterol  (BREZTRI  AEROSPHERE) 160-9-4.8 MCG/ACT AERO inhaler Inhale 2 puffs into the lungs 2 (two) times daily. 10.7 g 3   cholecalciferol (VITAMIN D3) 25 MCG (1000 UT) tablet Take 1,000 Units by mouth daily.      dapagliflozin  propanediol (FARXIGA ) 10 MG TABS tablet Take 1 tablet (10 mg total) by mouth daily before breakfast. 90 tablet 3   ELIQUIS  5 MG TABS tablet Take 1 tablet by mouth twice daily 180 tablet 0   HYDROcodone-acetaminophen  (NORCO) 10-325 MG tablet Take 1-2 tablets by mouth 2 (two) times daily as needed for moderate pain (pain score 4-6).     icosapent  Ethyl (VASCEPA ) 1 g capsule Take 2 capsules (2 g total) by mouth 2 (two) times daily. (Patient taking differently: Take 1 g by mouth 2 (two) times daily.) 120 capsule 2   insulin  aspart (NOVOLOG  FLEXPEN) 100 UNIT/ML FlexPen Recommend increase preprandial (before meals) insulin  novolog  Check sugars before meals Take 6 U prior to breakfast, lunch, and supper. + 1 u if sugar 150-200. + 2 u if sugar between 201-250 + 3 U if sugar between 251-300 + 4 U if sugar between 301-350 + 5 U if sugar between 351-400 + 6 U if sugar > 400. 15 mL 2   insulin  degludec (TRESIBA  FLEXTOUCH) 100 UNIT/ML FlexTouch Pen Inject 60 Units into the skin every evening.     ipratropium-albuterol  (DUONEB) 0.5-2.5 (3) MG/3ML SOLN INHALE CONTENTS OF 1 VIAL VIA NEBULIZER EVERY 6 HOURS AS NEEDED FOR SHORTNESS OF BREATH 360 mL 10   levocetirizine (XYZAL) 5 MG tablet Take 5 mg by mouth daily as needed for allergies.      metolazone  (ZAROXOLYN ) 5 MG tablet Take 5 mg by mouth once a week. May take an additional 5 mg tablet for swelling if needed.     metoprolol  tartrate (LOPRESSOR ) 50 MG tablet TAKE 1 TABLET BY MOUTH EVERY MORNING , 1 TABLET IN THE EVENING AND 1 TABLET AT BEDTIME 90 tablet 1   montelukast  (SINGULAIR ) 10 MG tablet TAKE 1 TABLET BY MOUTH EVERY DAY AT BEDTIME 30 tablet 10   nystatin  (MYCOSTATIN /NYSTOP ) powder Apply 1 Application topically 3 (three) times daily. 30 g 1   Omeprazole 20 MG TBEC Take 20 mg by mouth 2 (two) times daily before a meal.      potassium chloride  SA (KLOR-CON  M) 20 MEQ tablet TAKE 2 TABLETS BY MOUTH AT BREAKFAST AND AT BEDTIME 120 tablet 10   rOPINIRole  (REQUIP ) 1 MG tablet Take  1 tablet (1 mg total) by mouth at bedtime. (Patient taking differently: Take 1 mg by mouth at bedtime as needed (restless legs).) 30 tablet 2   rosuvastatin  (CRESTOR ) 40 MG tablet TAKE 1 TABLET BY MOUTH EVERY DAY AT BEDTIME 30 tablet 10   Semaglutide , 1 MG/DOSE, (OZEMPIC , 1 MG/DOSE,) 2 MG/1.5ML SOPN Inject 1 mg into the skin once a week. 9 mL 3   tamsulosin  (FLOMAX ) 0.4 MG CAPS capsule TAKE 1 CAPSULE BY MOUTH EVERY DAY AT BEDTIME 30 capsule 10   tiZANidine  (ZANAFLEX ) 2 MG tablet TAKE 1 TABLET BY MOUTH EVERY DAY AT BEDTIME 30 tablet 10   torsemide  (DEMADEX ) 20 MG tablet TAKE TWO (2) TABLETS BY MOUTH EACH MORNING AND TAKE 2 TABLETS AT NOON 120 tablet 10   VENTOLIN  HFA 108 (90 Base) MCG/ACT inhaler INHALE 2 PUFFS BY MOUTH EVERY 6 HOURS AS NEEDED AS NEEDED FOR SHORTNESS OF BREATH/WHEEZING 18 g 11   No current facility-administered medications on file prior to visit.   Past Medical History:  Diagnosis Date   Acquired thrombophilia (HCC) 12/24/2021   Acute cough 08/23/2023   Acute recurrent frontal sinusitis 08/23/2023   Anticoagulated 07/01/2018   Anticoagulated 07/01/2018   Aortic atherosclerosis (HCC) 02/06/2024   Atrial fibrillation, chronic (HCC) 05/31/2020   Benign essential hypertension 05/06/2013    Bilateral pseudophakia 12/16/2015   Cellulitis of scrotum 02/06/2024   Chronic combined systolic and diastolic congestive heart failure (HCC) 02/06/2024   Chronic obstructive pulmonary disease (HCC) 11/05/2015   Chronic pain syndrome 08/30/2017   CKD stage 3b, GFR 30-44 ml/min (HCC) 07/01/2018   Class 3 severe obesity due to excess calories with serious comorbidity and body mass index (BMI) of 40.0 to 44.9 in adult 10/04/2017   COPD (chronic obstructive pulmonary disease) (HCC) 07/01/2018   Depressive disorder 05/06/2013   Dermatochalasis 12/16/2015   Diabetic polyneuropathy (HCC) 02/23/2020   Disorder of bursae and tendons in shoulder region 01/05/2014   Edema 11/04/2015   Formatting of this note might be different from the original.  proBNP is low, 83, Echo wo findings of heart failure     Encounter for immunization 09/12/2023   Encounter for screening for pneumonia 05/06/2023   Gastroesophageal reflux disease without esophagitis 02/23/2020   Generalized edema 06/22/2016   Hyperbilirubinemia 02/06/2024   Hyperlipidemia 12/16/2015   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 stage 3b chronic kidney disease (HCC) 06/14/2019   Hypertensive heart disease 07/01/2018   Low back pain 08/17/2013   Lumbosacral spondylosis 05/06/2013   Mild protein malnutrition (HCC) 02/06/2024   Obstructive sleep apnea 07/01/2018   On amiodarone  therapy 03/24/2019   PAF (paroxysmal atrial fibrillation) (HCC) 07/01/2018   Persistent atrial fibrillation (HCC) 07/01/2018   Piriformis syndrome 05/03/2014   Postlaminectomy syndrome, lumbar region 05/06/2013   Presbyopia of both eyes 12/16/2015   Restless legs syndrome 03/23/2016   Restrictive lung disease 06/22/2016   Retroperitoneal mass 02/06/2024   Seasonal allergic rhinitis due to pollen 09/12/2023   SI (sacroiliac) pain 08/12/2020   Sleep apnea 05/06/2013   Splenomegaly 02/06/2024    Status post intraocular lens implant 12/16/2015   Thrombocytopenia (HCC) 02/06/2024   Type 2 diabetes mellitus without complications (HCC) 11/05/2015   Uncomplicated opioid dependence (HCC) 12/24/2021   Past Surgical History:  Procedure Laterality Date   APPENDECTOMY     BACK SURGERY     CARDIOVERSION N/A 12/01/2018   Procedure: CARDIOVERSION;  Surgeon: Francyne Headland, MD;  Location: [facility];  Service: Cardiovascular;  Laterality: N/A;   CATARACT EXTRACTION Bilateral    LUMBAR FUSION     NECK SURGERY      Family History  Problem Relation Age of Onset   Atrial fibrillation Mother    Cancer Mother        vaginal cancer   Heart disease Father    Social History   Socioeconomic History   Marital status: Widowed    Spouse name: Not on file   Number of children: Not on file   Years of education: Not on file   Highest education level: Not on file  Occupational History   Not on file  Tobacco Use   Smoking status: Former    Current packs/day: 0.00    Types: Cigarettes    Quit date: 62    Years since quitting: 32.7   Smokeless tobacco: Never  Vaping Use   Vaping status: Never Used  Substance and Sexual Activity   Alcohol use: Not Currently   Drug use: Not Currently    Types: Hydrocodone   Sexual activity: Not on file  Other Topics Concern   Not on file  Social History Narrative   Not on file   Social Drivers of Health   Financial Resource Strain: Low Risk  (08/12/2023)   Overall Financial Resource Strain (CARDIA)    Difficulty of Paying Living Expenses: Not hard at all  Food Insecurity: No Food Insecurity (02/06/2024)   Hunger Vital Sign    Worried About Running Out of Food in the Last Year: Never true    Ran Out of Food in the Last Year: Never true  Transportation Needs: No Transportation Needs (02/06/2024)   PRAPARE - Administrator, Civil Service (Medical): No    Lack of Transportation (Non-Medical): No  Physical Activity: Inactive (08/12/2023)    Exercise Vital Sign    Days of Exercise per Week: 0 days    Minutes of Exercise per Session: 0 min  Stress: No Stress Concern Present (08/12/2023)   [employer] of Occupational Health - Occupational Stress Questionnaire    Feeling of Stress : Not at all  Social Connections: Socially Integrated (02/06/2024)   Social Connection and Isolation Panel    Frequency of Communication with Friends and Family: Three times a week    Frequency of Social Gatherings with Friends and Family: Once a week    Attends Religious Services: 1 to 4 times per year    Active Member of [employer] or Organizations: Yes    Attends Banker Meetings: 1 to 4 times per year    Marital Status: Married    Objective:  BP 138/76   Pulse 92   Temp 98 F (36.7 C)   Ht 6' 1 (1.854 m)   Wt (!) 330 lb (149.7 kg)   SpO2 98%   BMI 43.54 kg/m      08/15/2024    9:45 AM 07/22/2024    6:30 PM 07/22/2024    5:30 PM  BP/Weight  Systolic BP 138 130 119  Diastolic BP 76 75 78  Wt. (Lbs) 330    BMI 43.54 kg/m2      Physical Exam Vitals reviewed.  Constitutional:      Appearance: Normal appearance. He is obese.  Neck:     Vascular: No carotid bruit.  Cardiovascular:     Rate and Rhythm: Normal rate. Rhythm irregular.     Pulses: Normal pulses.     Heart sounds: Normal heart sounds.  Pulmonary:     Effort: Pulmonary effort is normal.     Breath sounds: Normal breath sounds. No wheezing, rhonchi or rales.  Abdominal:     General: Bowel sounds are normal.     Palpations: Abdomen is soft.     Tenderness: There is no abdominal tenderness.  Neurological:     Mental Status: He is alert and oriented to person, place, and time.  Psychiatric:        Behavior: Behavior normal.     Comments: depressed      Diabetic foot exam was performed with the following findings:   Intact posterior tibialis and dorsalis pedis pulses Decreased sensation BL Lower extremities. Thickened nails and calluses.       Lab  Results  Component Value Date   WBC 8.6 08/15/2024   HGB 15.7 08/15/2024   HCT 49.0 08/15/2024   PLT 156 08/15/2024   GLUCOSE 121 (H) 08/15/2024   CHOL 140 04/07/2024   TRIG 166 (H) 04/07/2024   HDL 32 (L) 04/07/2024   LDLCALC 79 04/07/2024   ALT 24 08/15/2024   AST 31 08/15/2024   NA 142 08/15/2024   K 3.4 (L) 08/15/2024   CL 94 (L) 08/15/2024   CREATININE 1.80 (H) 08/15/2024   BUN 24 08/15/2024   CO2 27 08/15/2024   TSH 3.790 11/09/2022   INR 1.0 03/20/2024   HGBA1C 7.1 08/15/2024      Assessment & Plan:  Diabetic polyneuropathy associated with type 2 diabetes mellitus (HCC) Assessment & Plan: - Continue current diabetes management regimen including Tresiba  70 units daily and Novolog  before largest meal. - Provide a sample of a continuous glucose monitor if available.  Orders: -     POCT glycosylated hemoglobin (Hb A1C)  Severe episode of recurrent major depressive disorder, without psychotic features (HCC) Assessment & Plan: - Increase Zoloft  to 200 mg daily. - Discussed counseling benefits, but he declined. - Follow up in 6 weeks to assess response to medication adjustment.  Orders: -     Sertraline  HCl; Take 2 tablets (200 mg total) by mouth daily.  Dispense: 180 tablet; Refill: 1  Primary insomnia Assessment & Plan: - Discontinue trazodone . - Start Lunesta  for sleep. - Follow up in 6 weeks to assess response to new sleep medication.   PAF (paroxysmal atrial fibrillation) (HCC) Assessment & Plan: Atrial fibrillation managed with amiodarone  and Eliquis , no recent falls. - Continue amiodarone  and Eliquis  as prescribed.   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 stage 3b chronic kidney disease (HCC) -     CBC with Differential/Platelet -     Comprehensive metabolic panel with GFR  Simple chronic bronchitis (HCC) Assessment & Plan: COPD managed with Breztri  and albuterol  inhaler. -  Continue Breztri  and albuterol  inhaler as prescribed.   Benign prostatic hyperplasia with lower urinary tract symptoms, symptom details unspecified Assessment & Plan: Benign prostatic hyperplasia with nocturia, managed with Flomax . - Continue Flomax  as prescribed.   Chronic pain syndrome Assessment & Plan: The current medical regimen is effective;  continue present plan and medications.  Management per specialist.     Mixed hyperlipidemia Assessment & Plan: Hyperlipidemia well-controlled, LDL 77, improved triglycerides and HDL. - Continue current lipid management regimen.  Orders: -     POCT Lipid Panel  Other orders -     Eszopiclone ; Take 1 tablet (3 mg total) by mouth at bedtime. Take immediately before bedtime  Dispense: 30 tablet; Refill: 2  Meds ordered this encounter  Medications   sertraline  (ZOLOFT ) 100 MG tablet    Sig: Take 2 tablets (200 mg total) by mouth daily.    Dispense:  180 tablet    Refill:  1   eszopiclone  3 MG TABS    Sig: Take 1 tablet (3 mg total) by mouth at bedtime. Take immediately before bedtime    Dispense:  30 tablet    Refill:  2    Orders Placed This Encounter  Procedures   CBC with Differential/Platelet   Comprehensive metabolic panel with GFR   POCT glycosylated hemoglobin (Hb A1C)   POCT Lipid Panel       Follow-up: Return in about 6 weeks (around 09/26/2024) for chronic follow up, 12 week chronic follow up.  Total time spent on today's visit was 40 minutes, including both face-to-face time and nonface-to-face time personally spent on review of chart (labs and imaging), discussing labs and goals, discussing further work-up, treatment options, referrals to specialist if needed, reviewing outside records of pertinent, answering patient's questions, and coordinating care.  I,Marla I Leal-Borjas,acting as a scribe for Abigail Free, MD.,have documented all relevant documentation on the behalf of Abigail Free, MD,as directed by   Abigail Free, MD while in the presence of Abigail Free, MD.    An After Visit Summary was printed and given to the patient.  I attest that I have reviewed this visit and agree with the plan scribed by my staff.   Abigail Free, MD [facility] [phone]

## 2024-08-16 ENCOUNTER — Ambulatory Visit: Payer: Self-pay | Admitting: Family Medicine

## 2024-08-16 LAB — CBC WITH DIFFERENTIAL/PLATELET
Basophils Absolute: 0.1 x10E3/uL (ref 0.0–0.2)
Basos: 1 %
EOS (ABSOLUTE): 0.2 x10E3/uL (ref 0.0–0.4)
Eos: 2 %
Hematocrit: 49 % (ref 37.5–51.0)
Hemoglobin: 15.7 g/dL (ref 13.0–17.7)
Immature Grans (Abs): 0 x10E3/uL (ref 0.0–0.1)
Immature Granulocytes: 0 %
Lymphocytes Absolute: 1.9 x10E3/uL (ref 0.7–3.1)
Lymphs: 22 %
MCH: 30.8 pg (ref 26.6–33.0)
MCHC: 32 g/dL (ref 31.5–35.7)
MCV: 96 fL (ref 79–97)
Monocytes Absolute: 0.7 x10E3/uL (ref 0.1–0.9)
Monocytes: 8 %
Neutrophils Absolute: 5.8 x10E3/uL (ref 1.4–7.0)
Neutrophils: 67 %
Platelets: 156 x10E3/uL (ref 150–450)
RBC: 5.09 x10E6/uL (ref 4.14–5.80)
RDW: 13.6 % (ref 11.6–15.4)
WBC: 8.6 x10E3/uL (ref 3.4–10.8)

## 2024-08-16 LAB — COMPREHENSIVE METABOLIC PANEL WITH GFR
ALT: 24 IU/L (ref 0–44)
AST: 31 IU/L (ref 0–40)
Albumin: 3.8 g/dL (ref 3.8–4.8)
Alkaline Phosphatase: 118 IU/L (ref 47–123)
BUN/Creatinine Ratio: 13 (ref 10–24)
BUN: 24 mg/dL (ref 8–27)
Bilirubin Total: 1.4 mg/dL — ABNORMAL HIGH (ref 0.0–1.2)
CO2: 27 mmol/L (ref 20–29)
Calcium: 10 mg/dL (ref 8.6–10.2)
Chloride: 94 mmol/L — ABNORMAL LOW (ref 96–106)
Creatinine, Ser: 1.8 mg/dL — ABNORMAL HIGH (ref 0.76–1.27)
Globulin, Total: 2.5 g/dL (ref 1.5–4.5)
Glucose: 121 mg/dL — ABNORMAL HIGH (ref 70–99)
Potassium: 3.4 mmol/L — ABNORMAL LOW (ref 3.5–5.2)
Sodium: 142 mmol/L (ref 134–144)
Total Protein: 6.3 g/dL (ref 6.0–8.5)
eGFR: 38 mL/min/1.73 — ABNORMAL LOW (ref >59)

## 2024-08-17 ENCOUNTER — Ambulatory Visit

## 2024-08-20 DIAGNOSIS — F5101 Primary insomnia: Secondary | ICD-10-CM | POA: Insufficient documentation

## 2024-08-20 DIAGNOSIS — N401 Enlarged prostate with lower urinary tract symptoms: Secondary | ICD-10-CM

## 2024-08-20 DIAGNOSIS — F332 Major depressive disorder, recurrent severe without psychotic features: Secondary | ICD-10-CM

## 2024-08-20 HISTORY — DX: Major depressive disorder, recurrent severe without psychotic features: F33.2

## 2024-08-20 HISTORY — DX: Primary insomnia: F51.01

## 2024-08-20 HISTORY — DX: Benign prostatic hyperplasia with lower urinary tract symptoms: N40.1

## 2024-08-20 NOTE — Assessment & Plan Note (Signed)
-   Increase Zoloft  to 200 mg daily. - Discussed counseling benefits, but he declined. - Follow up in 6 weeks to assess response to medication adjustment.

## 2024-08-20 NOTE — Assessment & Plan Note (Signed)
-   Continue ropinirole . - Consider alternative treatments if symptoms persist.

## 2024-08-20 NOTE — Assessment & Plan Note (Signed)
-   Continue potassium supplementation, 4 tablets daily. - Monitor potassium levels regularly.

## 2024-08-20 NOTE — Assessment & Plan Note (Signed)
 Atrial fibrillation managed with amiodarone  and Eliquis , no recent falls. - Continue amiodarone  and Eliquis  as prescribed.

## 2024-08-20 NOTE — Assessment & Plan Note (Signed)
 COPD managed with Breztri  and albuterol  inhaler. - Continue Breztri  and albuterol  inhaler as prescribed.

## 2024-08-20 NOTE — Assessment & Plan Note (Signed)
 Benign prostatic hyperplasia with nocturia, managed with Flomax . - Continue Flomax  as prescribed.

## 2024-08-20 NOTE — Assessment & Plan Note (Signed)
-   Discontinue trazodone . - Start Lunesta  for sleep. - Follow up in 6 weeks to assess response to new sleep medication.

## 2024-08-20 NOTE — Assessment & Plan Note (Signed)
-   Continue current diabetes management regimen including Tresiba  70 units daily and Novolog  before largest meal. - Provide a sample of a continuous glucose monitor if available.

## 2024-08-20 NOTE — Assessment & Plan Note (Signed)
The current medical regimen is effective;  continue present plan and medications. Management per specialist.   

## 2024-08-20 NOTE — Assessment & Plan Note (Signed)
 Hyperlipidemia well-controlled, LDL 77, improved triglycerides and HDL. - Continue current lipid management regimen.

## 2024-08-21 ENCOUNTER — Other Ambulatory Visit: Payer: Self-pay | Admitting: Cardiology

## 2024-08-21 ENCOUNTER — Other Ambulatory Visit: Payer: Self-pay | Admitting: Family Medicine

## 2024-08-21 MED ORDER — APIXABAN 5 MG PO TABS: 5.0000 mg | ORAL_TABLET | Freq: Two times a day (BID) | ORAL | Status: AC

## 2024-08-21 NOTE — Telephone Encounter (Signed)
 Copied from CRM [id number]. Topic: Clinical - Medication Refill >> Aug 21, 2024 11:09 AM Emylou G wrote: Medication: ELIQUIS  5 MG TABS tablet  Has the patient contacted their pharmacy? Yes (Agent: If no, request that the patient contact the pharmacy for the refill. If patient does not wish to contact the pharmacy document the reason why and proceed with request.) (Agent: If yes, when and what did the pharmacy advise?) said out of refills  This is the patient's preferred pharmacy:    [facility] [address], [place] - [address] [place] [address] Phone: [phone] Fax: [phone]   Is this the correct pharmacy for this prescription? Yes If no, delete pharmacy and type the correct one.   Has the prescription been filled recently? No  Is the patient out of the medication? Yes  Has the patient been seen for an appointment in the last year OR does the patient have an upcoming appointment? Yes  Can we respond through MyChart? Yes  Agent: Please be advised that Rx refills may take up to 3 business days. We ask that you follow-up with your pharmacy.

## 2024-08-22 ENCOUNTER — Telehealth: Payer: Self-pay

## 2024-08-22 NOTE — Telephone Encounter (Signed)
 Called patient and informed him that we have received his patient assistance for Ozempic  1 mg and Tresiba  and that it is ready for pick up.   Patient assistance received:   Ozempic  1 mg 4 boxes NDC [phone] Lot # [id number] EXP: 11/29/2026   Tresiba  U100 5 boxes  NDC# [id number]  Lot# [id number]  EXP: 08/29/2026

## 2024-08-29 NOTE — Telephone Encounter (Signed)
Patients daughter picked up patient assistance

## 2024-08-30 ENCOUNTER — Ambulatory Visit: Admitting: Family Medicine

## 2024-09-01 ENCOUNTER — Other Ambulatory Visit: Payer: Self-pay

## 2024-09-01 MED ORDER — ESZOPICLONE 3 MG PO TABS: 3.0000 mg | ORAL_TABLET | Freq: Every day | ORAL | 2 refills | Status: DC

## 2024-09-01 NOTE — Telephone Encounter (Signed)
 Copied from CRM [id number]. Topic: Clinical - Prescription Issue >> Sep 01, 2024 11:22 AM Carlatta H wrote: Reason for CRM: Patient never did receive eszopiclone  3 MG TABS [[id number]] medication//Please advised paitent

## 2024-09-07 ENCOUNTER — Ambulatory Visit

## 2024-09-07 VITALS — Ht 73.0 in | Wt 330.0 lb

## 2024-09-07 DIAGNOSIS — Z0000 Encounter for general adult medical examination without abnormal findings: Secondary | ICD-10-CM | POA: Diagnosis not present

## 2024-09-07 DIAGNOSIS — Z59869 Financial insecurity, unspecified: Secondary | ICD-10-CM

## 2024-09-07 NOTE — Patient Instructions (Signed)
 Shawn Sullivan,  Thank you for taking the time for your Medicare Wellness Visit. I appreciate your continued commitment to your health goals. Please review the care plan we discussed, and feel free to reach out if I can assist you further.  Medicare recommends these wellness visits once per year to help you and your care team stay ahead of potential health issues. These visits are designed to focus on prevention, allowing your provider to concentrate on managing your acute and chronic conditions during your regular appointments.  Please note that Annual Wellness Visits do not include a physical exam. Some assessments may be limited, especially if the visit was conducted virtually. If needed, we may recommend a separate in-person follow-up with your provider.  Ongoing Care Seeing your primary care provider every 3 to 6 months helps us  monitor your health and provide consistent, personalized care.   Referrals If a referral was made during today's visit and you haven't received any updates within two weeks, please contact the referred provider directly to check on the status.  Recommended Screenings:  Health Maintenance  Topic Date Due   DTaP/Tdap/Td vaccine (1 - Tdap) Never done   Eye exam for diabetics  01/17/2020   Zoster (Shingles) Vaccine (2 of 2) 04/18/2023   COVID-19 Vaccine (5 - 2025-26 season) 07/31/2024   Flu Shot  02/27/2025*   Yearly kidney health urinalysis for diabetes  12/19/2024   Hemoglobin A1C  02/12/2025   Complete foot exam   04/07/2025   Yearly kidney function blood test for diabetes  08/15/2025   Medicare Annual Wellness Visit  09/07/2025   Pneumococcal Vaccine for age over 57  Completed   Meningitis B Vaccine  Aged Out   Colon Cancer Screening  Discontinued   Hepatitis C Screening  Discontinued  *Topic was postponed. The date shown is not the original due date.       09/07/2024   10:53 AM  Advanced Directives  Does Patient Have a Medical Advance Directive? No   Would patient like information on creating a medical advance directive? Yes (MAU/Ambulatory/Procedural Areas - Information given)   Advance Care Planning is important because it: Ensures you receive medical care that aligns with your values, goals, and preferences. Provides guidance to your family and loved ones, reducing the emotional burden of decision-making during critical moments.  Information on Advanced Care Planning can be found at [place]  Secretary of [facility] Advance Health Care Directives Advance Health Care Directives ([email or web address]   Vision: Annual vision screenings are recommended for early detection of glaucoma, cataracts, and diabetic retinopathy. These exams can also reveal signs of chronic conditions such as diabetes and high blood pressure.  Dental: Annual dental screenings help detect early signs of oral cancer, gum disease, and other conditions linked to overall health, including heart disease and diabetes.  Please see the attached documents for additional preventive care recommendations.

## 2024-09-07 NOTE — Progress Notes (Signed)
 Subjective:   Shawn Sullivan is a 78 y.o. who presents for a Medicare Wellness preventive visit.  As a reminder, Annual Wellness Visits don't include a physical exam, and some assessments may be limited, especially if this visit is performed virtually. We may recommend an in-person follow-up visit with your provider if needed.  Visit Complete: Virtual I connected with  Shawn Sullivan on 09/07/24 by a audio enabled telemedicine application and verified that I am speaking with the correct person using two identifiers.  Patient Location: Home  Provider Location: Home Office  I discussed the limitations of evaluation and management by telemedicine. The patient expressed understanding and agreed to proceed.  Vital Signs: Because this visit was a virtual/telehealth visit, some criteria may be missing or patient reported. Any vitals not documented were not able to be obtained and vitals that have been documented are patient reported.  VideoDeclined- This patient declined Librarian, academic. Therefore the visit was completed with audio only.  Persons Participating in Visit: Patient assisted by daughter Shawn Sullivan.  AWV Questionnaire: No: Patient Medicare AWV questionnaire was not completed prior to this visit.  Cardiac Risk Factors include: advanced age (>77men, >58 women);diabetes mellitus;dyslipidemia;hypertension;male gender     Objective:    Today's Vitals   09/07/24 1050  Weight: (!) 330 lb (149.7 kg)  Height: 6' 1 (1.854 m)   Body mass index is 43.54 kg/m.     09/07/2024   10:53 AM 07/22/2024    2:40 PM 02/06/2024    9:00 AM 02/05/2024    8:17 PM 08/12/2023    2:13 PM 10/18/2020   10:12 AM 12/01/2018   12:28 PM  Advanced Directives  Does Patient Have a Medical Advance Directive? No No No No No No No   Would patient like information on creating a medical advance directive? Yes (MAU/Ambulatory/Procedural Areas - Information given)  No - Patient  declined  No - Patient declined  No - Patient declined      Data saved with a previous flowsheet row definition    Current Medications (verified) Outpatient Encounter Medications as of 09/07/2024  Medication Sig   acetaminophen  (TYLENOL ) 500 MG tablet Take 1,000 mg by mouth every 6 (six) hours as needed for moderate pain or headache.   amiodarone  (PACERONE ) 200 MG tablet TAKE 1 TABLET BY MOUTH EVERY EVENING.   apixaban  (ELIQUIS ) 5 MG TABS tablet Take 1 tablet by mouth twice daily   apixaban  (ELIQUIS ) 5 MG TABS tablet Take 1 tablet (5 mg total) by mouth 2 (two) times daily.   budesonide-glycopyrrolate-formoterol  (BREZTRI  AEROSPHERE) 160-9-4.8 MCG/ACT AERO inhaler Inhale 2 puffs into the lungs 2 (two) times daily.   cholecalciferol (VITAMIN D3) 25 MCG (1000 UT) tablet Take 1,000 Units by mouth daily.    dapagliflozin  propanediol (FARXIGA ) 10 MG TABS tablet Take 1 tablet (10 mg total) by mouth daily before breakfast.   eszopiclone  3 MG TABS Take 1 tablet (3 mg total) by mouth at bedtime. Take immediately before bedtime   HYDROcodone-acetaminophen  (NORCO) 10-325 MG tablet Take 1-2 tablets by mouth 2 (two) times daily as needed for moderate pain (pain score 4-6).   icosapent  Ethyl (VASCEPA ) 1 g capsule Take 2 capsules (2 g total) by mouth 2 (two) times daily. (Patient taking differently: Take 1 g by mouth 2 (two) times daily.)   insulin  aspart (NOVOLOG  FLEXPEN) 100 UNIT/ML FlexPen Recommend increase preprandial (before meals) insulin  novolog  Check sugars before meals Take 6 U prior to breakfast, lunch, and supper. +  1 u if sugar 150-200. + 2 u if sugar between 201-250 + 3 U if sugar between 251-300 + 4 U if sugar between 301-350 + 5 U if sugar between 351-400 + 6 U if sugar > 400.   insulin  degludec (TRESIBA  FLEXTOUCH) 100 UNIT/ML FlexTouch Pen Inject 60 Units into the skin every evening.   ipratropium-albuterol  (DUONEB) 0.5-2.5 (3) MG/3ML SOLN INHALE CONTENTS OF 1 VIAL VIA NEBULIZER EVERY 6 HOURS AS  NEEDED FOR SHORTNESS OF BREATH   levocetirizine (XYZAL) 5 MG tablet Take 5 mg by mouth daily as needed for allergies.   metolazone  (ZAROXOLYN ) 5 MG tablet Take 5 mg by mouth once a week. May take an additional 5 mg tablet for swelling if needed.   metoprolol  tartrate (LOPRESSOR ) 50 MG tablet TAKE 1 TABLET BY MOUTH EVERY MORNING, TAKE 1 TABLET IN THE EVENING, AND TAKE 1 TABLET AT BEDTIME.   montelukast  (SINGULAIR ) 10 MG tablet TAKE 1 TABLET BY MOUTH EVERY Sullivan AT BEDTIME   nystatin  (MYCOSTATIN /NYSTOP ) powder Apply 1 Application topically 3 (three) times daily.   Omeprazole 20 MG TBEC Take 20 mg by mouth 2 (two) times daily before a meal.    potassium chloride  SA (KLOR-CON  M) 20 MEQ tablet TAKE 2 TABLETS BY MOUTH AT BREAKFAST AND AT BEDTIME   rOPINIRole  (REQUIP ) 1 MG tablet Take 1 tablet (1 mg total) by mouth at bedtime. (Patient taking differently: Take 1 mg by mouth at bedtime as needed (restless legs).)   rosuvastatin  (CRESTOR ) 40 MG tablet TAKE 1 TABLET BY MOUTH EVERY Sullivan AT BEDTIME   Semaglutide , 1 MG/DOSE, (OZEMPIC , 1 MG/DOSE,) 2 MG/1.5ML SOPN Inject 1 mg into the skin once a week.   sertraline  (ZOLOFT ) 100 MG tablet Take 2 tablets (200 mg total) by mouth daily.   tamsulosin  (FLOMAX ) 0.4 MG CAPS capsule TAKE 1 CAPSULE BY MOUTH EVERY Sullivan AT BEDTIME   tiZANidine  (ZANAFLEX ) 2 MG tablet TAKE 1 TABLET BY MOUTH EVERY Sullivan AT BEDTIME   torsemide  (DEMADEX ) 20 MG tablet TAKE TWO (2) TABLETS BY MOUTH EACH MORNING AND TAKE 2 TABLETS AT NOON   VENTOLIN  HFA 108 (90 Base) MCG/ACT inhaler INHALE 2 PUFFS BY MOUTH EVERY 6 HOURS AS NEEDED AS NEEDED FOR SHORTNESS OF BREATH/WHEEZING   No facility-administered encounter medications on file as of 09/07/2024.    Allergies (verified) Ambien [zolpidem], Androgel [testosterone], Biaxin [clarithromycin], Duloxetine, Gabapentin, Ibuprofen, Lyrica [pregabalin], and Spironolactone   History: Past Medical History:  Diagnosis Date   Acquired thrombophilia 12/24/2021    Acute cough 08/23/2023   Acute recurrent frontal sinusitis 08/23/2023   Anticoagulated 07/01/2018   Anticoagulated 07/01/2018   Aortic atherosclerosis 02/06/2024   Atrial fibrillation, chronic (HCC) 05/31/2020   Benign essential hypertension 05/06/2013   Bilateral pseudophakia 12/16/2015   Cellulitis of scrotum 02/06/2024   Chronic combined systolic and diastolic congestive heart failure (HCC) 02/06/2024   Chronic obstructive pulmonary disease (HCC) 11/05/2015   Chronic pain syndrome 08/30/2017   CKD stage 3b, GFR 30-44 ml/min (HCC) 07/01/2018   Class 3 severe obesity due to excess calories with serious comorbidity and body mass index (BMI) of 40.0 to 44.9 in adult (HCC) 10/04/2017   COPD (chronic obstructive pulmonary disease) (HCC) 07/01/2018   Depressive disorder 05/06/2013   Dermatochalasis 12/16/2015   Diabetic polyneuropathy (HCC) 02/23/2020   Disorder of bursae and tendons in shoulder region 01/05/2014   Edema 11/04/2015   Formatting of this note might be different from the original.  proBNP is low, 81, Echo wo findings of heart failure  Encounter for immunization 09/12/2023   Encounter for screening for pneumonia 05/06/2023   Gastroesophageal reflux disease without esophagitis 02/23/2020   Generalized edema 06/22/2016   Hyperbilirubinemia 02/06/2024   Hyperlipidemia 12/16/2015   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 stage 3b chronic kidney disease (HCC) 06/14/2019   Hypertensive heart disease 07/01/2018   Low back pain 08/17/2013   Lumbosacral spondylosis 05/06/2013   Mild protein malnutrition 02/06/2024   Obstructive sleep apnea 07/01/2018   On amiodarone  therapy 03/24/2019   PAF (paroxysmal atrial fibrillation) (HCC) 07/01/2018   Persistent atrial fibrillation (HCC) 07/01/2018   Piriformis syndrome 05/03/2014   Postlaminectomy syndrome, lumbar region 05/06/2013   Presbyopia of both eyes  12/16/2015   Restless legs syndrome 03/23/2016   Restrictive lung disease 06/22/2016   Retroperitoneal mass 02/06/2024   Seasonal allergic rhinitis due to pollen 09/12/2023   SI (sacroiliac) pain 08/12/2020   Sleep apnea 05/06/2013   Splenomegaly 02/06/2024   Status post intraocular lens implant 12/16/2015   Thrombocytopenia 02/06/2024   Type 2 diabetes mellitus without complications (HCC) 11/05/2015   Uncomplicated opioid dependence (HCC) 12/24/2021   Past Surgical History:  Procedure Laterality Date   APPENDECTOMY     BACK SURGERY     CARDIOVERSION N/A 12/01/2018   Procedure: CARDIOVERSION;  Surgeon: Francyne Headland, MD;  Location: [facility];  Service: Cardiovascular;  Laterality: N/A;   CATARACT EXTRACTION Bilateral    LUMBAR FUSION     NECK SURGERY     Family History  Problem Relation Age of Onset   Atrial fibrillation Mother    Cancer Mother        vaginal cancer   Heart disease Father    Social History   Socioeconomic History   Marital status: Widowed    Spouse name: Not on file   Number of children: Not on file   Years of education: Not on file   Highest education level: Not on file  Occupational History   Not on file  Tobacco Use   Smoking status: Former    Current packs/Sullivan: 0.00    Types: Cigarettes    Quit date: 48    Years since quitting: 32.7   Smokeless tobacco: Never  Vaping Use   Vaping status: Never Used  Substance and Sexual Activity   Alcohol use: Not Currently   Drug use: Not Currently    Types: Hydrocodone   Sexual activity: Not on file  Other Topics Concern   Not on file  Social History Narrative   Not on file   Social Drivers of Health   Financial Resource Strain: Low Risk  (09/07/2024)   Overall Financial Resource Strain (CARDIA)    Difficulty of Paying Living Expenses: Not very hard  Food Insecurity: No Food Insecurity (09/07/2024)   Hunger Vital Sign    Worried About Running Out of Food in the Last Year: Never true    Ran  Out of Food in the Last Year: Never true  Transportation Needs: No Transportation Needs (09/07/2024)   PRAPARE - Administrator, Civil Service (Medical): No    Lack of Transportation (Non-Medical): No  Physical Activity: Inactive (09/07/2024)   Exercise Vital Sign    Days of Exercise per Week: 0 days    Minutes of Exercise per Session: 0 min  Stress: No Stress Concern Present (09/07/2024)   [employer] of Occupational Health - Occupational Stress Questionnaire    Feeling of Stress: Not at all  Social Connections: Moderately Integrated (09/07/2024)   Social Connection and Isolation Panel    Frequency of Communication with Friends and Family: Three times a week    Frequency of Social Gatherings with Friends and Family: Three times a week    Attends Religious Services: 1 to 4 times per year    Active Member of Clubs or Organizations: Yes    Attends Banker Meetings: 1 to 4 times per year    Marital Status: Widowed    Tobacco Counseling Counseling given: Not Answered    Clinical Intake:  Pre-visit preparation completed: Yes  Pain : No/denies pain  Diabetes: Yes CBG done?: No Did pt. bring in CBG monitor from home?: No  Lab Results  Component Value Date   HGBA1C 7.1 08/15/2024   HGBA1C 7.0 (H) 04/07/2024   HGBA1C 6.6 (H) 12/20/2023     How often do you need to have someone help you when you read instructions, pamphlets, or other written materials from your doctor or pharmacy?: 1 - Never  Interpreter Needed?: No  Information entered by :: Charmaine Bloodgood LPN   Activities of Daily Living     09/07/2024   10:51 AM 03/20/2024   12:06 PM  In your present state of health, do you have any difficulty performing the following activities:  Hearing? 0 0  Vision? 0 0  Difficulty concentrating or making decisions? 0 0  Walking or climbing stairs? 1   Dressing or bathing? 0   Doing errands, shopping? 1   Preparing Food and eating ? N   Using the  Toilet? N   In the past six months, have you accidently leaked urine? N   Do you have problems with loss of bowel control? N   Managing your Medications? Y   Managing your Finances? Y   Housekeeping or managing your Housekeeping? Y     Patient Care Team: Sherre Clapper, MD as PCP - General (Family Medicine) Nyle Rankin POUR, [facility] (Inactive) (Pharmacist) Monetta Redell PARAS, MD as Consulting Physician (Cardiology) Powell Slater RIGGERS as Physician Assistant (Physician Assistant) Trudy Lynwood DASEN, MD as Referring Physician (Dermatology) Dolan Mateo Larger, MD as Consulting Physician (Nephrology) Golden Forestine BROCKS, RN as Oncology Nurse Navigator (Medical Oncology)  I have updated your Care Teams any recent Medical Services you may have received from other providers in the past year.     Assessment:   This is a routine wellness examination for Shawn Sullivan.  Hearing/Vision screen Hearing Screening - Comments:: Patient is able to hear conversational tones without difficulty. No issues reported.   Vision Screening - Comments:: No vision problems; will schedule routine eye exam     Goals Addressed               This Visit's Progress     Live as long as I can. (pt-stated)   On track      Depression Screen     09/07/2024   10:52 AM 08/15/2024    9:57 AM 12/20/2023   11:07 AM 08/12/2023    2:08 PM 02/15/2023   11:23 AM 11/09/2022   11:16 AM 08/20/2022   12:01 PM  PHQ 2/9 Scores  PHQ - 2 Score 4 6 1  0 0 0 0  PHQ- 9 Score 15 18 7   0  9    Fall Risk     09/07/2024   10:54 AM 08/12/2023    2:12 PM 02/15/2023   11:22 AM 11/09/2022   11:15 AM 08/20/2022   12:01  PM  Fall Risk   Falls in the past year? 0 0 0 0 1  Number falls in past yr: 0 0 0 0 1  Injury with Fall? 0 0 0 0 0  Risk for fall due to : Impaired mobility No Fall Risks No Fall Risks No Fall Risks Orthopedic patient;Impaired balance/gait  Follow up Falls prevention discussed;Education provided;Falls evaluation completed  Falls prevention discussed Falls evaluation completed Falls evaluation completed  Falls evaluation completed;Falls prevention discussed      Data saved with a previous flowsheet row definition    MEDICARE RISK AT HOME:  Medicare Risk at Home Any stairs in or around the home?: No If so, are there any without handrails?: No Home free of loose throw rugs in walkways, pet beds, electrical cords, etc?: Yes Adequate lighting in your home to reduce risk of falls?: Yes Life alert?: No Use of a cane, walker or w/c?: Yes Grab bars in the bathroom?: Yes Shower chair or bench in shower?: Yes Elevated toilet seat or a handicapped toilet?: Yes  TIMED UP AND GO:  Was the test performed?  No  Cognitive Function: 6CIT completed        09/07/2024   10:54 AM 08/12/2023    2:13 PM  6CIT Screen  What Year? 0 points 0 points  What month? 0 points 0 points  What time? 0 points 0 points  Count back from 20 0 points 0 points  Months in reverse 0 points 0 points  Repeat phrase 2 points 0 points  Total Score 2 points 0 points    Immunizations Immunization History  Administered Date(s) Administered   Fluad Quad(high Dose 65+) 10/11/2020, 09/09/2021, 11/09/2022   Fluad Trivalent(High Dose 65+) 09/07/2023   Influenza-Unspecified 08/16/2019   Moderna Covid-19 Vaccine Bivalent Booster 74yrs & up 09/09/2021   Moderna SARS-COV2 Booster Vaccination 12/10/2020   Moderna Sars-Covid-2 Vaccination 12/22/2019, 01/19/2020   PNEUMOCOCCAL CONJUGATE-20 02/21/2023   Pfizer(Comirnaty)Fall Seasonal Vaccine 12 years and older 11/09/2022   Pneumococcal Conjugate-13 07/26/2014   Pneumococcal Polysaccharide-23 08/19/2012   Zoster Recombinant(Shingrix) 02/21/2023    Screening Tests Health Maintenance  Topic Date Due   DTaP/Tdap/Td (1 - Tdap) Never done   OPHTHALMOLOGY EXAM  01/17/2020   Zoster Vaccines- Shingrix (2 of 2) 04/18/2023   COVID-19 Vaccine (5 - 2025-26 season) 07/31/2024   Influenza Vaccine   02/27/2025 (Originally 06/30/2024)   Diabetic kidney evaluation - Urine ACR  12/19/2024   HEMOGLOBIN A1C  02/12/2025   FOOT EXAM  04/07/2025   Diabetic kidney evaluation - eGFR measurement  08/15/2025   Medicare Annual Wellness (AWV)  09/07/2025   Pneumococcal Vaccine: 50+ Years  Completed   Meningococcal B Vaccine  Aged Out   Colonoscopy  Discontinued   Hepatitis C Screening  Discontinued    Health Maintenance Items Addressed: Vaccines Due: Flu, Will schedule diabetic eye exam  Additional Screening:  Vision Screening: Recommended annual ophthalmology exams for early detection of glaucoma and other disorders of the eye. Is the patient up to date with their annual eye exam?  No  Who is the provider or what is the name of the office in which the patient attends annual eye exams? None; looking to establish  Dental Screening: Recommended annual dental exams for proper oral hygiene  Community Resource Referral / Chronic Care Management: CRR required this visit?  Yes   CCM required this visit?  No   Plan:    I have personally reviewed and noted the following in the patient's chart:  Medical and social history Use of alcohol, tobacco or illicit drugs  Current medications and supplements including opioid prescriptions. Patient is not currently taking opioid prescriptions. Functional ability and status Nutritional status Physical activity Advanced directives List of other physicians Hospitalizations, surgeries, and ER visits in previous 12 months Vitals Screenings to include cognitive, depression, and falls Referrals and appointments  In addition, I have reviewed and discussed with patient certain preventive protocols, quality metrics, and best practice recommendations. A written personalized care plan for preventive services as well as general preventive health recommendations were provided to patient.   Lavelle Pfeiffer [place], [place]   [id number]   After Visit Summary:  (MyChart) Due to this being a telephonic visit, the after visit summary with patients personalized plan was offered to patient via MyChart   Notes: Nothing significant to report at this time.

## 2024-09-11 ENCOUNTER — Telehealth: Payer: Self-pay

## 2024-09-11 NOTE — Telephone Encounter (Signed)
 2026 Renewal PAP: Patient assistance application for Breztri  and Farxiga  through [employer] (AZ&Me) has been mailed to pt's home address on file. Provider portion of application will be faxed to provider's office.   PAP: Patient assistance application for Novolog  and Tresiba  through [employer] has been mailed to pt's home address on file. Provider portion of application will be faxed to provider's office.   Provider portion of patient assistance application has been faxed to Dr. Abigail Free

## 2024-09-15 DIAGNOSIS — M47816 Spondylosis without myelopathy or radiculopathy, lumbar region: Secondary | ICD-10-CM | POA: Diagnosis not present

## 2024-09-15 NOTE — Telephone Encounter (Signed)
 Received provider portion of patient assistance applications

## 2024-09-19 ENCOUNTER — Other Ambulatory Visit: Payer: Self-pay | Admitting: Cardiology

## 2024-09-19 ENCOUNTER — Other Ambulatory Visit: Payer: Self-pay | Admitting: Family Medicine

## 2024-09-21 ENCOUNTER — Other Ambulatory Visit: Payer: Self-pay | Admitting: Cardiology

## 2024-09-24 NOTE — Assessment & Plan Note (Deleted)
 Shawn Sullivan

## 2024-09-24 NOTE — Progress Notes (Deleted)
 Subjective:  Patient ID: Shawn Sullivan, male    DOB: [birth year]  Age: 78 y.o. MRN: [id number]  No chief complaint on file.   HPI: Discussed the use of AI scribe software for clinical note transcription with the patient, who gave verbal consent to proceed.  History of Present Illness   Depression: increased zoloft  200 mg daily and insomnia started on lunesta  3 mg before bed. .     09/07/2024   10:52 AM 08/15/2024    9:57 AM 12/20/2023   11:07 AM 08/12/2023    2:08 PM 02/15/2023   11:23 AM  Depression screen PHQ 2/9  Decreased Interest 2 3 1  0 0  Down, Depressed, Hopeless 2 3 0 0 0  PHQ - 2 Score 4 6 1  0 0  Altered sleeping 3 3 3   0  Tired, decreased energy 3 3 3   0  Change in appetite 3 3 0  0  Feeling bad or failure about yourself  1 1 0  0  Trouble concentrating 0 0 0  0  Moving slowly or fidgety/restless 1 1 0  0  Suicidal thoughts 0 1 0  0  PHQ-9 Score 15 18 7   0  Difficult doing work/chores   Not difficult at all  Not difficult at all        09/07/2024   10:54 AM  Fall Risk   Falls in the past year? 0  Number falls in past yr: 0  Injury with Fall? 0  Risk for fall due to : Impaired mobility  Follow up Falls prevention discussed;Education provided;Falls evaluation completed    Patient Care Team: Marquis Down, Abigail, MD as PCP - General (Family Medicine) Nyle Rankin POUR, [facility] (Inactive) (Pharmacist) Monetta Redell PARAS, MD as Consulting Physician (Cardiology) Powell Bradley, PA-C as Physician Assistant (Physician Assistant) Trudy Lynwood DASEN, MD as Referring Physician (Dermatology) Dolan Mateo Larger, MD as Consulting Physician (Nephrology) Golden Forestine BROCKS, RN as Oncology Nurse Navigator (Medical Oncology)   Review of Systems  Current Outpatient Medications on File Prior to Visit  Medication Sig Dispense Refill   acetaminophen  (TYLENOL ) 500 MG tablet Take 1,000 mg by mouth every 6 (six) hours as needed for moderate pain or headache.     amiodarone  (PACERONE )  200 MG tablet TAKE 1 TABLET BY MOUTH EVERY EVENING 15 tablet 0   apixaban  (ELIQUIS ) 5 MG TABS tablet Take 1 tablet by mouth twice daily 180 tablet 3   apixaban  (ELIQUIS ) 5 MG TABS tablet Take 1 tablet (5 mg total) by mouth 2 (two) times daily.     budesonide-glycopyrrolate-formoterol  (BREZTRI  AEROSPHERE) 160-9-4.8 MCG/ACT AERO inhaler Inhale 2 puffs into the lungs 2 (two) times daily. 10.7 g 3   cholecalciferol (VITAMIN D3) 25 MCG (1000 UT) tablet Take 1,000 Units by mouth daily.      dapagliflozin  propanediol (FARXIGA ) 10 MG TABS tablet Take 1 tablet (10 mg total) by mouth daily before breakfast. 90 tablet 3   eszopiclone  3 MG TABS Take 1 tablet (3 mg total) by mouth at bedtime. Take immediately before bedtime 30 tablet 2   HYDROcodone-acetaminophen  (NORCO) 10-325 MG tablet Take 1-2 tablets by mouth 2 (two) times daily as needed for moderate pain (pain score 4-6).     icosapent  Ethyl (VASCEPA ) 1 g capsule Take 2 capsules (2 g total) by mouth 2 (two) times daily. (Patient taking differently: Take 1 g by mouth 2 (two) times daily.) 120 capsule 2   insulin  aspart (NOVOLOG  FLEXPEN) 100 UNIT/ML FlexPen Recommend increase  preprandial (before meals) insulin  novolog  Check sugars before meals Take 6 U prior to breakfast, lunch, and supper. + 1 u if sugar 150-200. + 2 u if sugar between 201-250 + 3 U if sugar between 251-300 + 4 U if sugar between 301-350 + 5 U if sugar between 351-400 + 6 U if sugar > 400. 15 mL 2   insulin  degludec (TRESIBA  FLEXTOUCH) 100 UNIT/ML FlexTouch Pen Inject 60 Units into the skin every evening.     ipratropium-albuterol  (DUONEB) 0.5-2.5 (3) MG/3ML SOLN INHALE CONTENTS OF 1 VIAL VIA NEBULIZER EVERY 6 HOURS AS NEEDED FOR SHORTNESS OF BREATH 360 mL 10   levocetirizine (XYZAL) 5 MG tablet Take 5 mg by mouth daily as needed for allergies.     metolazone  (ZAROXOLYN ) 5 MG tablet Take 5 mg by mouth once a week. May take an additional 5 mg tablet for swelling if needed.     metoprolol   tartrate (LOPRESSOR ) 50 MG tablet TAKE 1 TABLET BY MOUTH EVERY MORNING, TAKE 1 TABLET IN THE EVENING, AND TAKE 1 TABLET AT BEDTIME. 45 tablet 0   montelukast  (SINGULAIR ) 10 MG tablet TAKE 1 TABLET BY MOUTH EVERY DAY AT BEDTIME 30 tablet 10   nystatin  (MYCOSTATIN /NYSTOP ) powder Apply 1 Application topically 3 (three) times daily. 30 g 1   Omeprazole 20 MG TBEC Take 20 mg by mouth 2 (two) times daily before a meal.      potassium chloride  SA (KLOR-CON  M) 20 MEQ tablet TAKE 2 TABLETS BY MOUTH TWICE DAILY AT BREAKFAST AND AT BEDTIME 120 tablet 11   rOPINIRole  (REQUIP ) 1 MG tablet Take 1 tablet (1 mg total) by mouth at bedtime. (Patient taking differently: Take 1 mg by mouth at bedtime as needed (restless legs).) 30 tablet 2   rosuvastatin  (CRESTOR ) 40 MG tablet TAKE 1 TABLET BY MOUTH DAILY AT BEDTIME 30 tablet 11   Semaglutide , 1 MG/DOSE, (OZEMPIC , 1 MG/DOSE,) 2 MG/1.5ML SOPN Inject 1 mg into the skin once a week. 9 mL 3   sertraline  (ZOLOFT ) 100 MG tablet Take 2 tablets (200 mg total) by mouth daily. 180 tablet 1   tamsulosin  (FLOMAX ) 0.4 MG CAPS capsule TAKE 1 CAPSULE BY MOUTH EVERY DAY AT BEDTIME 30 capsule 10   tiZANidine  (ZANAFLEX ) 2 MG tablet TAKE 1 TABLET BY MOUTH DAILY AT BEDTIME 30 tablet 11   torsemide  (DEMADEX ) 20 MG tablet TAKE 2 TABLETS BY MOUTH TWICE DAILY EACH MORNING AND AT NOON 120 tablet 11   VENTOLIN  HFA 108 (90 Base) MCG/ACT inhaler INHALE 2 PUFFS BY MOUTH EVERY 6 HOURS AS NEEDED AS NEEDED FOR SHORTNESS OF BREATH/WHEEZING 18 g 11   No current facility-administered medications on file prior to visit.   Past Medical History:  Diagnosis Date   Acquired thrombophilia 12/24/2021   Acute cough 08/23/2023   Acute recurrent frontal sinusitis 08/23/2023   Anticoagulated 07/01/2018   Anticoagulated 07/01/2018   Aortic atherosclerosis 02/06/2024   Atrial fibrillation, chronic (HCC) 05/31/2020   Benign essential hypertension 05/06/2013   Bilateral pseudophakia 12/16/2015   Cellulitis  of scrotum 02/06/2024   Chronic combined systolic and diastolic congestive heart failure (HCC) 02/06/2024   Chronic obstructive pulmonary disease (HCC) 11/05/2015   Chronic pain syndrome 08/30/2017   CKD stage 3b, GFR 30-44 ml/min (HCC) 07/01/2018   Class 3 severe obesity due to excess calories with serious comorbidity and body mass index (BMI) of 40.0 to 44.9 in adult [facility]) 10/04/2017   COPD (chronic obstructive pulmonary disease) (HCC) 07/01/2018   Depressive disorder 05/06/2013  Dermatochalasis 12/16/2015   Diabetic polyneuropathy (HCC) 02/23/2020   Disorder of bursae and tendons in shoulder region 01/05/2014   Edema 11/04/2015   Formatting of this note might be different from the original.  proBNP is low, 83, Echo wo findings of heart failure     Encounter for immunization 09/12/2023   Encounter for screening for pneumonia 05/06/2023   Gastroesophageal reflux disease without esophagitis 02/23/2020   Generalized edema 06/22/2016   Hyperbilirubinemia 02/06/2024   Hyperlipidemia 12/16/2015   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 stage 3b chronic kidney disease (HCC) 06/14/2019   Hypertensive heart disease 07/01/2018   Low back pain 08/17/2013   Lumbosacral spondylosis 05/06/2013   Mild protein malnutrition 02/06/2024   Obstructive sleep apnea 07/01/2018   On amiodarone  therapy 03/24/2019   PAF (paroxysmal atrial fibrillation) (HCC) 07/01/2018   Persistent atrial fibrillation (HCC) 07/01/2018   Piriformis syndrome 05/03/2014   Postlaminectomy syndrome, lumbar region 05/06/2013   Presbyopia of both eyes 12/16/2015   Restless legs syndrome 03/23/2016   Restrictive lung disease 06/22/2016   Retroperitoneal mass 02/06/2024   Seasonal allergic rhinitis due to pollen 09/12/2023   SI (sacroiliac) pain 08/12/2020   Sleep apnea 05/06/2013   Splenomegaly 02/06/2024   Status post intraocular lens implant 12/16/2015    Thrombocytopenia 02/06/2024   Type 2 diabetes mellitus without complications (HCC) 11/05/2015   Uncomplicated opioid dependence (HCC) 12/24/2021   Past Surgical History:  Procedure Laterality Date   APPENDECTOMY     BACK SURGERY     CARDIOVERSION N/A 12/01/2018   Procedure: CARDIOVERSION;  Surgeon: Francyne Headland, MD;  Location: [facility];  Service: Cardiovascular;  Laterality: N/A;   CATARACT EXTRACTION Bilateral    LUMBAR FUSION     NECK SURGERY      Family History  Problem Relation Age of Onset   Atrial fibrillation Mother    Cancer Mother        vaginal cancer   Heart disease Father    Social History   Socioeconomic History   Marital status: Widowed    Spouse name: Not on file   Number of children: Not on file   Years of education: Not on file   Highest education level: Not on file  Occupational History   Not on file  Tobacco Use   Smoking status: Former    Current packs/day: 0.00    Types: Cigarettes    Quit date: 63    Years since quitting: 32.8   Smokeless tobacco: Never  Vaping Use   Vaping status: Never Used  Substance and Sexual Activity   Alcohol use: Not Currently   Drug use: Not Currently    Types: Hydrocodone   Sexual activity: Not on file  Other Topics Concern   Not on file  Social History Narrative   Not on file   Social Drivers of Health   Financial Resource Strain: Low Risk  (09/07/2024)   Overall Financial Resource Strain (CARDIA)    Difficulty of Paying Living Expenses: Not very hard  Food Insecurity: No Food Insecurity (09/07/2024)   Hunger Vital Sign    Worried About Running Out of Food in the Last Year: Never true    Ran Out of Food in the Last Year: Never true  Transportation Needs: No Transportation Needs (09/07/2024)   PRAPARE - Administrator, Civil Service (Medical): No    Lack of Transportation (Non-Medical): No  Physical Activity: Inactive (09/07/2024)   Exercise  Vital Sign    Days of Exercise per Week: 0 days     Minutes of Exercise per Session: 0 min  Stress: No Stress Concern Present (09/07/2024)   [employer] of Occupational Health - Occupational Stress Questionnaire    Feeling of Stress: Not at all  Social Connections: Moderately Integrated (09/07/2024)   Social Connection and Isolation Panel    Frequency of Communication with Friends and Family: Three times a week    Frequency of Social Gatherings with Friends and Family: Three times a week    Attends Religious Services: 1 to 4 times per year    Active Member of Clubs or Organizations: Yes    Attends Banker Meetings: 1 to 4 times per year    Marital Status: Widowed    Objective:  There were no vitals taken for this visit.     09/07/2024   10:50 AM 08/15/2024    9:45 AM 07/22/2024    6:30 PM  BP/Weight  Systolic BP -- 138 130  Diastolic BP -- 76 75  Wt. (Lbs) 330 330   BMI 43.54 kg/m2 43.54 kg/m2     Physical Exam  {Perform Simple Foot Exam  Perform Detailed exam:1} {Insert foot Exam (Optional):30965}   Lab Results  Component Value Date   WBC 8.6 08/15/2024   HGB 15.7 08/15/2024   HCT 49.0 08/15/2024   PLT 156 08/15/2024   GLUCOSE 121 (H) 08/15/2024   CHOL 140 04/07/2024   TRIG 166 (H) 04/07/2024   HDL 32 (L) 04/07/2024   LDLCALC 79 04/07/2024   ALT 24 08/15/2024   AST 31 08/15/2024   NA 142 08/15/2024   K 3.4 (L) 08/15/2024   CL 94 (L) 08/15/2024   CREATININE 1.80 (H) 08/15/2024   BUN 24 08/15/2024   CO2 27 08/15/2024   TSH 3.790 11/09/2022   INR 1.0 03/20/2024   HGBA1C 7.1 08/15/2024    Results for orders placed or performed in visit on 08/15/24  POCT glycosylated hemoglobin (Hb A1C)   Collection Time: 08/15/24 10:29 AM  Result Value Ref Range   Hemoglobin A1C     HbA1c POC (<> result, manual entry) 7.1 4.0 - 5.6 %   HbA1c, POC (prediabetic range)     HbA1c, POC (controlled diabetic range)    POCT Lipid Panel   Collection Time: 08/15/24 10:29 AM  Result Value Ref Range   TC 142     HDL 39    TRG 133    LDL 77    Non-HDL 103    TC/HDL    CBC with Differential/Platelet   Collection Time: 08/15/24 10:33 AM  Result Value Ref Range   WBC 8.6 3.4 - 10.8 x10E3/uL   RBC 5.09 4.14 - 5.80 x10E6/uL   Hemoglobin 15.7 13.0 - 17.7 g/dL   Hematocrit 50.9 62.4 - 51.0 %   MCV 96 79 - 97 fL   MCH 30.8 26.6 - 33.0 pg   MCHC 32.0 31.5 - 35.7 g/dL   RDW 86.3 88.3 - 84.5 %   Platelets 156 150 - 450 x10E3/uL   Neutrophils 67 Not Estab. %   Lymphs 22 Not Estab. %   Monocytes 8 Not Estab. %   Eos 2 Not Estab. %   Basos 1 Not Estab. %   Neutrophils Absolute 5.8 1.4 - 7.0 x10E3/uL   Lymphocytes Absolute 1.9 0.7 - 3.1 x10E3/uL   Monocytes Absolute 0.7 0.1 - 0.9 x10E3/uL   EOS (ABSOLUTE) 0.2 0.0 - 0.4 x10E3/uL  Basophils Absolute 0.1 0.0 - 0.2 x10E3/uL   Immature Granulocytes 0 Not Estab. %   Immature Grans (Abs) 0.0 0.0 - 0.1 x10E3/uL  Comprehensive metabolic panel with GFR   Collection Time: 08/15/24 10:33 AM  Result Value Ref Range   Glucose 121 (H) 70 - 99 mg/dL   BUN 24 8 - 27 mg/dL   Creatinine, Ser 8.19 (H) 0.76 - 1.27 mg/dL   eGFR 38 (L) >40 fO/fpw/8.26   BUN/Creatinine Ratio 13 10 - 24   Sodium 142 134 - 144 mmol/L   Potassium 3.4 (L) 3.5 - 5.2 mmol/L   Chloride 94 (L) 96 - 106 mmol/L   CO2 27 20 - 29 mmol/L   Calcium  10.0 8.6 - 10.2 mg/dL   Total Protein 6.3 6.0 - 8.5 g/dL   Albumin 3.8 3.8 - 4.8 g/dL   Globulin, Total 2.5 1.5 - 4.5 g/dL   Bilirubin Total 1.4 (H) 0.0 - 1.2 mg/dL   Alkaline Phosphatase 118 47 - 123 IU/L   AST 31 0 - 40 IU/L   ALT 24 0 - 44 IU/L  .  Assessment & Plan:   Assessment & Plan Primary insomnia     Severe episode of recurrent major depressive disorder, without psychotic features (HCC)       There is no height or weight on file to calculate BMI.  Assessment and Plan Assessment & Plan      No orders of the defined types were placed in this encounter.   No orders of the defined types were placed in this encounter.       Follow-up: No follow-ups on file.  An After Visit Summary was printed and given to the patient.  Abigail Free, MD [facility] [phone]

## 2024-09-25 ENCOUNTER — Ambulatory Visit: Admitting: Family Medicine

## 2024-09-25 DIAGNOSIS — F332 Major depressive disorder, recurrent severe without psychotic features: Secondary | ICD-10-CM

## 2024-09-25 DIAGNOSIS — F5101 Primary insomnia: Secondary | ICD-10-CM

## 2024-10-02 ENCOUNTER — Other Ambulatory Visit: Payer: Self-pay | Admitting: Cardiology

## 2024-10-03 ENCOUNTER — Telehealth: Payer: Self-pay | Admitting: Cardiology

## 2024-10-03 NOTE — Telephone Encounter (Signed)
 Pt needs an appt and pt's medication has already been sent to pt's pharmacy as requested. Confirmation received.

## 2024-10-03 NOTE — Telephone Encounter (Signed)
*  STAT* If patient is at the pharmacy, call can be transferred to refill team.   1. Which medications need to be refilled? (please list name of each medication and dose if known) amiodarone  (PACERONE ) 200 MG tablet   2. Which pharmacy/location (including street and city if local pharmacy) is medication to be sent to? [facility] - [address], [place] - [address] Phone: [phone]  Fax: [phone]      3. Do they need a 30 day or 90 day supply? 90

## 2024-10-17 NOTE — Telephone Encounter (Signed)
 PAP: Application for Shawn Sullivan has been submitted to [employer] ([employer]), via fax  PAP: Application for Novolog and [place] has been submitted to [employer], via fax

## 2024-10-19 NOTE — Telephone Encounter (Signed)
 PAP: Patient assistance application for Breztri  and Farxiga  has been approved by PAP Companies: [employer] from 1/1/20206 to 11/29/2025. Medication should be delivered to PAP Delivery: Home. For further shipping updates, please contact [employer] ([employer]) at [phone]. Patient ID is: [id number]

## 2024-10-20 NOTE — Telephone Encounter (Signed)
 PAP: Patient assistance application for Novolog  and Tresiba  has been approved by PAP Companies: [employer] from 11/30/2024 to 12/31/20026. Medication should be delivered to PAP Delivery: Provider's office. For further shipping updates, please contact [employer] at [phone]. Patient ID is: [id number]

## 2024-10-23 ENCOUNTER — Ambulatory Visit: Payer: Self-pay

## 2024-10-23 NOTE — Telephone Encounter (Signed)
 FYI Only or Action Required?: FYI only for provider: appointment scheduled on 11/26.  Patient was last seen in primary care on 08/15/2024 by Sherre Clapper, MD.  Called Nurse Triage reporting Bed Sores.  Symptoms began unsure.  Symptoms are: gradually worsening.  Triage Disposition: See Physician Within 24 Hours  Patient/caregiver understands and will follow disposition?: Yes with modifications, unable to come in 11/25, appointment scheduled for 11/26     Copied from CRM #[id number]. Topic: Clinical - Red Word Triage >> Oct 23, 2024  3:12 PM Hadassah PARAS wrote: Red Word that prompted transfer to Nurse Triage: Pt's daughter Dorthea calling in to reschedule missed appointment. She stated pt has worsening bed sores and is requesting for PCP to send medication     Reason for Disposition  Large sore (> 1 inch or 2.5 cm across)  Answer Assessment - Initial Assessment Questions 1. APPEARANCE of SORES: What do the sores look like?     Unsure  2. NUMBER: How many sores are there?     Daughter is unsure  3. SIZE: How big is the largest sore?     Unsure  4. LOCATION: Where are the sores located?     Upper buttocks  5. ONSET: When did the sores begin?     Unsure  6. TENDER: Does it hurt when you touch it?  (Scale 1-10; or mild, moderate, severe)      Yes 7. CAUSE: What do you think is causing the sores?     Bed sore  8. OTHER SYMPTOMS: Do you have any other symptoms? (e.g., fever, new weakness)     No  Protocols used: Sores-A-AH

## 2024-10-24 DIAGNOSIS — I129 Hypertensive chronic kidney disease with stage 1 through stage 4 chronic kidney disease, or unspecified chronic kidney disease: Secondary | ICD-10-CM | POA: Diagnosis not present

## 2024-10-24 DIAGNOSIS — R600 Localized edema: Secondary | ICD-10-CM | POA: Diagnosis not present

## 2024-10-24 DIAGNOSIS — E876 Hypokalemia: Secondary | ICD-10-CM | POA: Diagnosis not present

## 2024-10-24 DIAGNOSIS — N189 Chronic kidney disease, unspecified: Secondary | ICD-10-CM | POA: Diagnosis not present

## 2024-10-24 DIAGNOSIS — E1122 Type 2 diabetes mellitus with diabetic chronic kidney disease: Secondary | ICD-10-CM | POA: Diagnosis not present

## 2024-10-24 DIAGNOSIS — E669 Obesity, unspecified: Secondary | ICD-10-CM | POA: Diagnosis not present

## 2024-10-24 DIAGNOSIS — N2581 Secondary hyperparathyroidism of renal origin: Secondary | ICD-10-CM | POA: Diagnosis not present

## 2024-10-24 DIAGNOSIS — E559 Vitamin D deficiency, unspecified: Secondary | ICD-10-CM | POA: Diagnosis not present

## 2024-10-24 DIAGNOSIS — N1832 Chronic kidney disease, stage 3b: Secondary | ICD-10-CM | POA: Diagnosis not present

## 2024-10-24 DIAGNOSIS — D631 Anemia in chronic kidney disease: Secondary | ICD-10-CM | POA: Diagnosis not present

## 2024-10-24 NOTE — Telephone Encounter (Signed)
 Attempted to have patient come today but he was unable to.

## 2024-10-25 ENCOUNTER — Ambulatory Visit: Admitting: Physician Assistant

## 2024-10-25 ENCOUNTER — Encounter: Payer: Self-pay | Admitting: Physician Assistant

## 2024-10-25 VITALS — BP 136/70 | HR 64 | Temp 98.2°F | Resp 16 | Ht 73.0 in | Wt 344.0 lb

## 2024-10-25 DIAGNOSIS — I480 Paroxysmal atrial fibrillation: Secondary | ICD-10-CM

## 2024-10-25 DIAGNOSIS — L89311 Pressure ulcer of right buttock, stage 1: Secondary | ICD-10-CM | POA: Diagnosis not present

## 2024-10-25 DIAGNOSIS — F5101 Primary insomnia: Secondary | ICD-10-CM

## 2024-10-25 DIAGNOSIS — F419 Anxiety disorder, unspecified: Secondary | ICD-10-CM

## 2024-10-25 DIAGNOSIS — R2681 Unsteadiness on feet: Secondary | ICD-10-CM

## 2024-10-25 MED ORDER — LORAZEPAM 0.5 MG PO TABS: 0.5000 mg | ORAL_TABLET | Freq: Every evening | ORAL | 1 refills | Status: DC | PRN

## 2024-10-25 MED ORDER — AMIODARONE HCL 200 MG PO TABS: ORAL_TABLET | ORAL | 0 refills | Status: DC

## 2024-10-25 NOTE — Progress Notes (Signed)
 Subjective:  Patient ID: Shawn Sullivan, male    DOB: 1946-07-28  Age: 78 y.o. MRN: [id number]  Chief Complaint  Patient presents with   Medical Management of Chronic Issues   Sore    On bottom    HPI Pt in today with daughter with complaints of pressure sore on buttocks.  States he has had issues for the past few months.  Area is not open and draining at this time but is red and painful Is agreeable for home health nursing to come out for evaluation and treatment as well  Pt has issues with mobility - he has chronic pain syndrome causing pain in lower back, hips and legs.  He also has restless leg syndrome.  He is sitting quite a bit and even more in the past few months since his wife passed away.  He is agreeable to have physical therapy come out to his home for evaluation which he has been resistant to do in the past  Pt is having issues with falling asleep and staying asleep.  Worsening symptoms since passing of wife a few months ago.  He tried ambien in the past which made him feel too groggy.  He was prescribed Lunesta  but it was too expensive so he did not try this medication .  His daughter has given him a low dose of xanax (which was wifes medication ) in the past and that did help him sleep and with the anxiety he usually has at night      10/25/2024    2:55 PM 09/07/2024   10:52 AM 08/15/2024    9:57 AM 12/20/2023   11:07 AM 08/12/2023    2:08 PM  Depression screen PHQ 2/9  Decreased Interest 0 2 3 1  0  Down, Depressed, Hopeless 2 2 3  0 0  PHQ - 2 Score 2 4 6 1  0  Altered sleeping 2 3 3 3    Tired, decreased energy 0 3 3 3    Change in appetite 0 3 3 0   Feeling bad or failure about yourself  0 1 1 0   Trouble concentrating 0 0 0 0   Moving slowly or fidgety/restless 0 1 1 0   Suicidal thoughts 0 0 1 0   PHQ-9 Score 4 15  18  7     Difficult doing work/chores    Not difficult at all      Data saved with a previous flowsheet row definition        11/09/2022   11:15  AM 02/15/2023   11:22 AM 08/12/2023    2:12 PM 09/07/2024   10:54 AM 10/25/2024    2:55 PM  Fall Risk  Falls in the past year? 0 0 0 0 1  Was there an injury with Fall? 0 0 0 0 0  Fall Risk Category Calculator 0 0 0 0 2  Fall Risk Category (Retired) Low       (RETIRED) Patient Fall Risk Level Low fall risk       Patient at Risk for Falls Due to No Fall Risks No Fall Risks No Fall Risks Impaired mobility History of fall(s)  Fall risk Follow up Falls evaluation completed  Falls evaluation completed Falls prevention discussed Falls prevention discussed;Education provided;Falls evaluation completed Education provided;Falls evaluation completed     Data saved with a previous flowsheet row definition     ROS CONSTITUTIONAL: Negative for chills, fatigue, fever, CARDIOVASCULAR: Negative for chest pain, dizziness, palpitations and pedal edema.  RESPIRATORY: Negative for recent cough and dyspnea.  MSK: see HPI INTEGUMENTARY: see HPI NEUROLOGICAL: Negative for dizziness and headaches.  PSYCHIATRIC: see HPI   Current Outpatient Medications:    acetaminophen  (TYLENOL ) 500 MG tablet, Take 1,000 mg by mouth every 6 (six) hours as needed for moderate pain or headache., Disp: , Rfl:    apixaban  (ELIQUIS ) 5 MG TABS tablet, Take 1 tablet (5 mg total) by mouth 2 (two) times daily., Disp: , Rfl:    budesonide-glycopyrrolate-formoterol  (BREZTRI  AEROSPHERE) 160-9-4.8 MCG/ACT AERO inhaler, Inhale 2 puffs into the lungs 2 (two) times daily., Disp: 10.7 g, Rfl: 3   cholecalciferol (VITAMIN D3) 25 MCG (1000 UT) tablet, Take 1,000 Units by mouth daily. , Disp: , Rfl:    dapagliflozin  propanediol (FARXIGA ) 10 MG TABS tablet, Take 1 tablet (10 mg total) by mouth daily before breakfast., Disp: 90 tablet, Rfl: 3   HYDROcodone-acetaminophen  (NORCO) 10-325 MG tablet, Take 1-2 tablets by mouth 2 (two) times daily as needed for moderate pain (pain score 4-6)., Disp: , Rfl:    icosapent  Ethyl (VASCEPA ) 1 g capsule, Take 2  capsules (2 g total) by mouth 2 (two) times daily., Disp: 120 capsule, Rfl: 2   insulin  aspart (NOVOLOG  FLEXPEN) 100 UNIT/ML FlexPen, Recommend increase preprandial (before meals) insulin  novolog  Check sugars before meals Take 6 U prior to breakfast, lunch, and supper. + 1 u if sugar 150-200. + 2 u if sugar between 201-250 + 3 U if sugar between 251-300 + 4 U if sugar between 301-350 + 5 U if sugar between 351-400 + 6 U if sugar > 400., Disp: 15 mL, Rfl: 2   insulin  degludec (TRESIBA  FLEXTOUCH) 100 UNIT/ML FlexTouch Pen, Inject 60 Units into the skin every evening. (Patient taking differently: Inject 70 Units into the skin every evening.), Disp: , Rfl:    LORazepam  (ATIVAN ) 0.5 MG tablet, Take 1 tablet (0.5 mg total) by mouth at bedtime as needed for anxiety or sleep., Disp: 30 tablet, Rfl: 1   metolazone  (ZAROXOLYN ) 5 MG tablet, Take 5 mg by mouth once a week. May take an additional 5 mg tablet for swelling if needed., Disp: , Rfl:    metoprolol  tartrate (LOPRESSOR ) 50 MG tablet, TAKE 1 TABLET BY MOUTH EVERY MORNING, TAKE 1 TABLET IN THE EVENING, AND TAKE 1 TABLET AT BEDTIME., Disp: 45 tablet, Rfl: 0   montelukast  (SINGULAIR ) 10 MG tablet, TAKE 1 TABLET BY MOUTH EVERY DAY AT BEDTIME, Disp: 30 tablet, Rfl: 10   nystatin  (MYCOSTATIN /NYSTOP ) powder, Apply 1 Application topically 3 (three) times daily., Disp: 30 g, Rfl: 1   Omeprazole 20 MG TBEC, Take 20 mg by mouth 2 (two) times daily before a meal. , Disp: , Rfl:    potassium chloride  SA (KLOR-CON  M) 20 MEQ tablet, TAKE 2 TABLETS BY MOUTH TWICE DAILY AT BREAKFAST AND AT BEDTIME, Disp: 120 tablet, Rfl: 11   rOPINIRole  (REQUIP ) 1 MG tablet, Take 1 tablet (1 mg total) by mouth at bedtime. (Patient taking differently: Take 1 mg by mouth at bedtime as needed (restless legs).), Disp: 30 tablet, Rfl: 2   rosuvastatin  (CRESTOR ) 40 MG tablet, TAKE 1 TABLET BY MOUTH DAILY AT BEDTIME, Disp: 30 tablet, Rfl: 11   Semaglutide , 1 MG/DOSE, (OZEMPIC , 1 MG/DOSE,) 2  MG/1.5ML SOPN, Inject 1 mg into the skin once a week., Disp: 9 mL, Rfl: 3   sertraline  (ZOLOFT ) 100 MG tablet, Take 2 tablets (200 mg total) by mouth daily., Disp: 180 tablet, Rfl: 1   tamsulosin  (FLOMAX ) 0.4  MG CAPS capsule, TAKE 1 CAPSULE BY MOUTH EVERY DAY AT BEDTIME, Disp: 30 capsule, Rfl: 10   tiZANidine  (ZANAFLEX ) 2 MG tablet, TAKE 1 TABLET BY MOUTH DAILY AT BEDTIME, Disp: 30 tablet, Rfl: 11   torsemide  (DEMADEX ) 20 MG tablet, TAKE 2 TABLETS BY MOUTH TWICE DAILY EACH MORNING AND AT NOON, Disp: 120 tablet, Rfl: 11   VENTOLIN  HFA 108 (90 Base) MCG/ACT inhaler, INHALE 2 PUFFS BY MOUTH EVERY 6 HOURS AS NEEDED AS NEEDED FOR SHORTNESS OF BREATH/WHEEZING, Disp: 18 g, Rfl: 11   amiodarone  (PACERONE ) 200 MG tablet, TAKE 1 TABLET BY MOUTH EVERY EVENING, Disp: 30 tablet, Rfl: 0   ipratropium-albuterol  (DUONEB) 0.5-2.5 (3) MG/3ML SOLN, INHALE CONTENTS OF 1 VIAL VIA NEBULIZER EVERY 6 HOURS AS NEEDED FOR SHORTNESS OF BREATH, Disp: 360 mL, Rfl: 10   levocetirizine (XYZAL) 5 MG tablet, Take 5 mg by mouth daily as needed for allergies., Disp: , Rfl:   Past Medical History:  Diagnosis Date   Acquired thrombophilia 12/24/2021   Acute cough 08/23/2023   Acute recurrent frontal sinusitis 08/23/2023   Anticoagulated 07/01/2018   Anticoagulated 07/01/2018   Aortic atherosclerosis 02/06/2024   Atrial fibrillation, chronic (HCC) 05/31/2020   Benign essential hypertension 05/06/2013   Bilateral pseudophakia 12/16/2015   Cellulitis of scrotum 02/06/2024   Chronic combined systolic and diastolic congestive heart failure (HCC) 02/06/2024   Chronic obstructive pulmonary disease (HCC) 11/05/2015   Chronic pain syndrome 08/30/2017   CKD stage 3b, GFR 30-44 ml/min (HCC) 07/01/2018   Class 3 severe obesity due to excess calories with serious comorbidity and body mass index (BMI) of 40.0 to 44.9 in adult (HCC) 10/04/2017   COPD (chronic obstructive pulmonary disease) (HCC) 07/01/2018   Depressive disorder 05/06/2013    Dermatochalasis 12/16/2015   Diabetic polyneuropathy (HCC) 02/23/2020   Disorder of bursae and tendons in shoulder region 01/05/2014   Edema 11/04/2015   Formatting of this note might be different from the original.  proBNP is low, 83, Echo wo findings of heart failure     Encounter for immunization 09/12/2023   Encounter for screening for pneumonia 05/06/2023   Gastroesophageal reflux disease without esophagitis 02/23/2020   Generalized edema 06/22/2016   Hyperbilirubinemia 02/06/2024   Hyperlipidemia 12/16/2015   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 stage 3b chronic kidney disease (HCC) 06/14/2019   Hypertensive heart disease 07/01/2018   Low back pain 08/17/2013   Lumbosacral spondylosis 05/06/2013   Mild protein malnutrition 02/06/2024   Obstructive sleep apnea 07/01/2018   On amiodarone  therapy 03/24/2019   PAF (paroxysmal atrial fibrillation) (HCC) 07/01/2018   Persistent atrial fibrillation (HCC) 07/01/2018   Piriformis syndrome 05/03/2014   Postlaminectomy syndrome, lumbar region 05/06/2013   Presbyopia of both eyes 12/16/2015   Restless legs syndrome 03/23/2016   Restrictive lung disease 06/22/2016   Retroperitoneal mass 02/06/2024   Seasonal allergic rhinitis due to pollen 09/12/2023   SI (sacroiliac) pain 08/12/2020   Sleep apnea 05/06/2013   Splenomegaly 02/06/2024   Status post intraocular lens implant 12/16/2015   Thrombocytopenia 02/06/2024   Type 2 diabetes mellitus without complications (HCC) 11/05/2015   Uncomplicated opioid dependence (HCC) 12/24/2021   Objective:  PHYSICAL EXAM:   BP 136/70   Pulse 64   Temp 98.2 F (36.8 C) (Temporal)   Resp 16   Ht 6' 1 (1.854 m)   Wt (!) 344 lb (156 kg)   SpO2 95%   BMI 45.39 kg/m    GEN:  Well nourished, well developed, in no acute distress - does walk with cane Cardiac: RRR; no murmurs, rubs, or gallops,no edema -  Respiratory:   normal respiratory rate and pattern with no distress - normal breath sounds with no rales, rhonchi, wheezes or rubs MS: no deformity or atrophy  Skin: both buttocks are red - small nonulcerated lesion size of dime on right buttock - no open wound Neuro:  Alert and Oriented x 3, - CN II-Xii grossly intact Psych: euthymic mood, appropriate affect and demeanor  Assessment & Plan:    Pressure injury of right buttock, stage 1 -     Ambulatory referral to Home Health Recommend keep area moisturized Change positioning every 15 minutes Home health referral made Primary insomnia -     LORazepam ; Take 1 tablet (0.5 mg total) by mouth at bedtime as needed for anxiety or sleep.  Dispense: 30 tablet; Refill: 1  PAF (paroxysmal atrial fibrillation) (HCC) -     Amiodarone  HCl; TAKE 1 TABLET BY MOUTH EVERY EVENING  Dispense: 30 tablet; Refill: 0  Gait instability -     Ambulatory referral to Home Health  Anxiety -     LORazepam ; Take 1 tablet (0.5 mg total) by mouth at bedtime as needed for anxiety or sleep.  Dispense: 30 tablet; Refill: 1     Follow-up: Return for as scheduled for next chronic visit with Dr Sherre.  An After Visit Summary was printed and given to the patient.  CAMIE JONELLE NICHOLAUS DEVONNA [facility] [phone]

## 2024-10-30 DIAGNOSIS — Z79899 Other long term (current) drug therapy: Secondary | ICD-10-CM | POA: Diagnosis not present

## 2024-10-30 DIAGNOSIS — M961 Postlaminectomy syndrome, not elsewhere classified: Secondary | ICD-10-CM | POA: Diagnosis not present

## 2024-10-30 DIAGNOSIS — M47816 Spondylosis without myelopathy or radiculopathy, lumbar region: Secondary | ICD-10-CM | POA: Diagnosis not present

## 2024-10-30 DIAGNOSIS — M533 Sacrococcygeal disorders, not elsewhere classified: Secondary | ICD-10-CM | POA: Diagnosis not present

## 2024-11-02 ENCOUNTER — Telehealth: Payer: Self-pay

## 2024-11-02 NOTE — Telephone Encounter (Signed)
 I left voicemail to call us  back.  Copied from CRM #[id number]. Topic: Clinical - Home Health Verbal Orders >> Nov 02, 2024 10:58 AM Pinkey ORN wrote: Caller/Agency: [facility]  Callback Number: [phone] Service Requested: Skilled Nursing Frequency: 1 week x 5 weeks  Any new concerns about the patient? No

## 2024-11-03 NOTE — Telephone Encounter (Signed)
 Called Deandra to give her verbal approval for skilled nursing.

## 2024-11-03 NOTE — Telephone Encounter (Signed)
 Called Elspeth to give him verbal approval for physical therapy  Copied from CRM [id number]. Topic: Clinical - Home Health Verbal Orders >> Nov 03, 2024  1:18 PM Donna BRAVO wrote: Caller/Agency: [facility] Callback Number: [phone] Service Requested: Physical Therapy Frequency: 1 week 5 Any new concerns about the patient? No

## 2024-11-20 ENCOUNTER — Other Ambulatory Visit: Payer: Self-pay | Admitting: Physician Assistant

## 2024-11-20 DIAGNOSIS — I480 Paroxysmal atrial fibrillation: Secondary | ICD-10-CM

## 2024-11-21 ENCOUNTER — Other Ambulatory Visit: Payer: Self-pay | Admitting: Family Medicine

## 2024-11-21 MED ORDER — METOPROLOL TARTRATE 50 MG PO TABS: ORAL_TABLET | ORAL | 0 refills | Status: AC

## 2024-11-21 NOTE — Telephone Encounter (Signed)
 Copied from CRM [id number]. Topic: Clinical - Medication Refill >> Nov 21, 2024  8:38 AM Emylou G wrote: Medication: metoprolol  tartrate (LOPRESSOR ) 50 MG tablet  Has the patient contacted their pharmacy? No (Agent: If no, request that the patient contact the pharmacy for the refill. If patient does not wish to contact the pharmacy document the reason why and proceed with request.) (Agent: If yes, when and what did the pharmacy advise?)  This is the patient's preferred pharmacy:    [facility] [address] [place] - [address] [place] [address] Phone: [phone] Fax: [phone]    Is this the correct pharmacy for this prescription? Yes If no, delete pharmacy and type the correct one.   Has the prescription been filled recently? Yes  Is the patient out of the medication? Yes  Has the patient been seen for an appointment in the last year OR does the patient have an upcoming appointment? Yes  Can we respond through MyChart? No  Agent: Please be advised that Rx refills may take up to 3 business days. We ask that you follow-up with your pharmacy.

## 2024-11-28 ENCOUNTER — Emergency Department (HOSPITAL_COMMUNITY)

## 2024-11-28 ENCOUNTER — Other Ambulatory Visit: Payer: Self-pay

## 2024-11-28 ENCOUNTER — Inpatient Hospital Stay (HOSPITAL_COMMUNITY)
Admission: EM | Admit: 2024-11-28 | Discharge: 2024-12-04 | DRG: 871 | Disposition: A | Discharge status: Home / Self Care | Attending: Internal Medicine | Admitting: Internal Medicine

## 2024-11-28 ENCOUNTER — Encounter (HOSPITAL_COMMUNITY): Payer: Self-pay

## 2024-11-28 DIAGNOSIS — I4891 Unspecified atrial fibrillation: Principal | ICD-10-CM

## 2024-11-28 DIAGNOSIS — I5020 Unspecified systolic (congestive) heart failure: Secondary | ICD-10-CM | POA: Diagnosis present

## 2024-11-28 DIAGNOSIS — Z961 Presence of intraocular lens: Secondary | ICD-10-CM | POA: Diagnosis present

## 2024-11-28 DIAGNOSIS — Z87891 Personal history of nicotine dependence: Secondary | ICD-10-CM

## 2024-11-28 DIAGNOSIS — I4819 Other persistent atrial fibrillation: Secondary | ICD-10-CM | POA: Diagnosis present

## 2024-11-28 DIAGNOSIS — Z6841 Body Mass Index (BMI) 40.0 and over, adult: Secondary | ICD-10-CM | POA: Diagnosis not present

## 2024-11-28 DIAGNOSIS — Z981 Arthrodesis status: Secondary | ICD-10-CM

## 2024-11-28 DIAGNOSIS — E876 Hypokalemia: Secondary | ICD-10-CM | POA: Diagnosis present

## 2024-11-28 DIAGNOSIS — Z1152 Encounter for screening for COVID-19: Secondary | ICD-10-CM

## 2024-11-28 DIAGNOSIS — J9601 Acute respiratory failure with hypoxia: Secondary | ICD-10-CM | POA: Diagnosis present

## 2024-11-28 DIAGNOSIS — G894 Chronic pain syndrome: Secondary | ICD-10-CM | POA: Diagnosis present

## 2024-11-28 DIAGNOSIS — F32A Depression, unspecified: Secondary | ICD-10-CM | POA: Diagnosis present

## 2024-11-28 DIAGNOSIS — F419 Anxiety disorder, unspecified: Secondary | ICD-10-CM | POA: Diagnosis not present

## 2024-11-28 DIAGNOSIS — H109 Unspecified conjunctivitis: Secondary | ICD-10-CM

## 2024-11-28 DIAGNOSIS — E785 Hyperlipidemia, unspecified: Secondary | ICD-10-CM | POA: Diagnosis present

## 2024-11-28 DIAGNOSIS — E1142 Type 2 diabetes mellitus with diabetic polyneuropathy: Secondary | ICD-10-CM | POA: Diagnosis present

## 2024-11-28 DIAGNOSIS — K219 Gastro-esophageal reflux disease without esophagitis: Secondary | ICD-10-CM | POA: Diagnosis present

## 2024-11-28 DIAGNOSIS — D696 Thrombocytopenia, unspecified: Secondary | ICD-10-CM | POA: Diagnosis present

## 2024-11-28 DIAGNOSIS — J1000 Influenza due to other identified influenza virus with unspecified type of pneumonia: Secondary | ICD-10-CM | POA: Diagnosis present

## 2024-11-28 DIAGNOSIS — Z888 Allergy status to other drugs, medicaments and biological substances status: Secondary | ICD-10-CM

## 2024-11-28 DIAGNOSIS — E66813 Obesity, class 3: Secondary | ICD-10-CM | POA: Diagnosis present

## 2024-11-28 DIAGNOSIS — G4733 Obstructive sleep apnea (adult) (pediatric): Secondary | ICD-10-CM | POA: Diagnosis present

## 2024-11-28 DIAGNOSIS — G2581 Restless legs syndrome: Secondary | ICD-10-CM | POA: Diagnosis present

## 2024-11-28 DIAGNOSIS — R6520 Severe sepsis without septic shock: Secondary | ICD-10-CM | POA: Diagnosis present

## 2024-11-28 DIAGNOSIS — E118 Type 2 diabetes mellitus with unspecified complications: Secondary | ICD-10-CM

## 2024-11-28 DIAGNOSIS — H1031 Unspecified acute conjunctivitis, right eye: Secondary | ICD-10-CM

## 2024-11-28 DIAGNOSIS — Z7985 Long-term (current) use of injectable non-insulin antidiabetic drugs: Secondary | ICD-10-CM | POA: Diagnosis not present

## 2024-11-28 DIAGNOSIS — A4189 Other specified sepsis: Principal | ICD-10-CM | POA: Diagnosis present

## 2024-11-28 DIAGNOSIS — B309 Viral conjunctivitis, unspecified: Secondary | ICD-10-CM | POA: Diagnosis present

## 2024-11-28 DIAGNOSIS — I130 Hypertensive heart and chronic kidney disease with heart failure and stage 1 through stage 4 chronic kidney disease, or unspecified chronic kidney disease: Secondary | ICD-10-CM | POA: Diagnosis present

## 2024-11-28 DIAGNOSIS — F332 Major depressive disorder, recurrent severe without psychotic features: Secondary | ICD-10-CM

## 2024-11-28 DIAGNOSIS — Z9842 Cataract extraction status, left eye: Secondary | ICD-10-CM

## 2024-11-28 DIAGNOSIS — J440 Chronic obstructive pulmonary disease with acute lower respiratory infection: Secondary | ICD-10-CM | POA: Diagnosis present

## 2024-11-28 DIAGNOSIS — I480 Paroxysmal atrial fibrillation: Secondary | ICD-10-CM | POA: Diagnosis present

## 2024-11-28 DIAGNOSIS — N1832 Chronic kidney disease, stage 3b: Secondary | ICD-10-CM | POA: Diagnosis present

## 2024-11-28 DIAGNOSIS — Z8249 Family history of ischemic heart disease and other diseases of the circulatory system: Secondary | ICD-10-CM

## 2024-11-28 DIAGNOSIS — A419 Sepsis, unspecified organism: Secondary | ICD-10-CM | POA: Diagnosis present

## 2024-11-28 DIAGNOSIS — Z7901 Long term (current) use of anticoagulants: Secondary | ICD-10-CM | POA: Diagnosis not present

## 2024-11-28 DIAGNOSIS — J441 Chronic obstructive pulmonary disease with (acute) exacerbation: Secondary | ICD-10-CM | POA: Diagnosis present

## 2024-11-28 DIAGNOSIS — F5101 Primary insomnia: Secondary | ICD-10-CM

## 2024-11-28 DIAGNOSIS — E1122 Type 2 diabetes mellitus with diabetic chronic kidney disease: Secondary | ICD-10-CM | POA: Diagnosis present

## 2024-11-28 DIAGNOSIS — Z794 Long term (current) use of insulin: Secondary | ICD-10-CM

## 2024-11-28 DIAGNOSIS — J101 Influenza due to other identified influenza virus with other respiratory manifestations: Secondary | ICD-10-CM | POA: Diagnosis present

## 2024-11-28 DIAGNOSIS — I5043 Acute on chronic combined systolic (congestive) and diastolic (congestive) heart failure: Secondary | ICD-10-CM | POA: Diagnosis present

## 2024-11-28 DIAGNOSIS — Z9841 Cataract extraction status, right eye: Secondary | ICD-10-CM

## 2024-11-28 DIAGNOSIS — I878 Other specified disorders of veins: Secondary | ICD-10-CM | POA: Diagnosis present

## 2024-11-28 DIAGNOSIS — Z79899 Other long term (current) drug therapy: Secondary | ICD-10-CM

## 2024-11-28 HISTORY — DX: Unspecified diastolic (congestive) heart failure: I50.30

## 2024-11-28 HISTORY — DX: Sepsis, unspecified organism: A41.9

## 2024-11-28 HISTORY — DX: Unspecified conjunctivitis: H10.9

## 2024-11-28 HISTORY — DX: Influenza due to other identified influenza virus with other respiratory manifestations: J10.1

## 2024-11-28 HISTORY — DX: Unspecified systolic (congestive) heart failure: I50.20

## 2024-11-28 LAB — CBG MONITORING, ED
Glucose-Capillary: 172 mg/dL — ABNORMAL HIGH (ref 70–99)
Glucose-Capillary: 230 mg/dL — ABNORMAL HIGH (ref 70–99)
Glucose-Capillary: 249 mg/dL — ABNORMAL HIGH (ref 70–99)
Glucose-Capillary: 183 mg/dL — ABNORMAL HIGH (ref 70–99)
Glucose-Capillary: 129 mg/dL — ABNORMAL HIGH (ref 70–99)

## 2024-11-28 LAB — CBC
HCT: 44.1 % (ref 39.0–52.0)
Hemoglobin: 13.8 g/dL (ref 13.0–17.0)
MCH: 29.9 pg (ref 26.0–34.0)
MCHC: 31.3 g/dL (ref 30.0–36.0)
MCV: 95.5 fL (ref 80.0–100.0)
Platelets: 94 K/uL — ABNORMAL LOW (ref 150–400)
RBC: 4.62 MIL/uL (ref 4.22–5.81)
RDW: 15.9 % — ABNORMAL HIGH (ref 11.5–15.5)
WBC: 7.5 K/uL (ref 4.0–10.5)
nRBC: 0 % (ref 0.0–0.2)

## 2024-11-28 LAB — BASIC METABOLIC PANEL WITH GFR
Anion gap: 10 (ref 5–15)
BUN: 22 mg/dL (ref 8–23)
CO2: 29 mmol/L (ref 22–32)
Calcium: 9 mg/dL (ref 8.9–10.3)
Chloride: 101 mmol/L (ref 98–111)
Creatinine, Ser: 1.58 mg/dL — ABNORMAL HIGH (ref 0.61–1.24)
GFR, Estimated: 44 mL/min — ABNORMAL LOW
Glucose, Bld: 146 mg/dL — ABNORMAL HIGH (ref 70–99)
Potassium: 4.2 mmol/L (ref 3.5–5.1)
Sodium: 141 mmol/L (ref 135–145)

## 2024-11-28 LAB — RESP PANEL BY RT-PCR (RSV, FLU A&B, COVID)  RVPGX2
Influenza A by PCR: POSITIVE — AB
Influenza B by PCR: NEGATIVE
Resp Syncytial Virus by PCR: NEGATIVE
SARS Coronavirus 2 by RT PCR: NEGATIVE

## 2024-11-28 LAB — I-STAT CG4 LACTIC ACID, ED
Lactic Acid, Venous: 2.1 mmol/L (ref 0.5–1.9)
Lactic Acid, Venous: 2 mmol/L (ref 0.5–1.9)

## 2024-11-28 LAB — PRO BRAIN NATRIURETIC PEPTIDE: Pro Brain Natriuretic Peptide: 2909 pg/mL — ABNORMAL HIGH

## 2024-11-28 LAB — PROCALCITONIN: Procalcitonin: 0.25 ng/mL

## 2024-11-28 LAB — MAGNESIUM: Magnesium: 2 mg/dL (ref 1.7–2.4)

## 2024-11-28 LAB — C-REACTIVE PROTEIN: CRP: 10.3 mg/dL — ABNORMAL HIGH

## 2024-11-28 MED ORDER — SODIUM CHLORIDE 0.9% FLUSH
3.0000 mL | Freq: Two times a day (BID) | INTRAVENOUS | Status: DC
  Administered 2024-11-28 – 2024-12-04 (×11): 3 mL via INTRAVENOUS

## 2024-11-28 MED ORDER — INSULIN ASPART 100 UNIT/ML IJ SOLN
0.0000 [IU] | Freq: Three times a day (TID) | INTRAMUSCULAR | Status: DC
  Administered 2024-11-28 (×2): 2 [IU] via SUBCUTANEOUS
  Administered 2024-11-28: 3 [IU] via SUBCUTANEOUS
  Administered 2024-11-29: 1 [IU] via SUBCUTANEOUS
  Administered 2024-11-29 – 2024-11-30 (×2): 2 [IU] via SUBCUTANEOUS
  Administered 2024-11-30 – 2024-12-01 (×2): 1 [IU] via SUBCUTANEOUS
  Administered 2024-12-02: 2 [IU] via SUBCUTANEOUS
  Administered 2024-12-02: 1 [IU] via SUBCUTANEOUS
  Administered 2024-12-03: 2 [IU] via SUBCUTANEOUS
  Administered 2024-12-04: 1 [IU] via SUBCUTANEOUS
  Filled 2024-11-28: qty 3
  Filled 2024-11-28 (×2): qty 1
  Filled 2024-11-28 (×2): qty 2
  Filled 2024-11-28: qty 1
  Filled 2024-11-28 (×5): qty 2

## 2024-11-28 MED ORDER — ACETAMINOPHEN 650 MG RE SUPP: 650.0000 mg | Freq: Four times a day (QID) | RECTAL | Status: DC | PRN

## 2024-11-28 MED ORDER — DILTIAZEM HCL-DEXTROSE 125-5 MG/125ML-% IV SOLN (PREMIX)
5.0000 mg/h | INTRAVENOUS | Status: DC
  Administered 2024-11-28: 15 mg/h via INTRAVENOUS
  Administered 2024-11-28: 5 mg/h via INTRAVENOUS
  Filled 2024-11-28 (×2): qty 125

## 2024-11-28 MED ORDER — OSELTAMIVIR PHOSPHATE 75 MG PO CAPS: 75.0000 mg | ORAL_CAPSULE | Freq: Once | ORAL | Status: DC

## 2024-11-28 MED ORDER — ROSUVASTATIN CALCIUM 20 MG PO TABS
40.0000 mg | ORAL_TABLET | Freq: Every day | ORAL | Status: DC
  Administered 2024-11-28 – 2024-12-03 (×6): 40 mg via ORAL
  Filled 2024-11-28 (×6): qty 2

## 2024-11-28 MED ORDER — IPRATROPIUM-ALBUTEROL 0.5-2.5 (3) MG/3ML IN SOLN
3.0000 mL | Freq: Four times a day (QID) | RESPIRATORY_TRACT | Status: DC
  Administered 2024-11-28 – 2024-11-29 (×4): 3 mL via RESPIRATORY_TRACT
  Filled 2024-11-28 (×4): qty 3

## 2024-11-28 MED ORDER — AMIODARONE LOAD VIA INFUSION
150.0000 mg | Freq: Once | INTRAVENOUS | Status: AC
  Administered 2024-11-28: 150 mg via INTRAVENOUS
  Filled 2024-11-28: qty 83.34

## 2024-11-28 MED ORDER — PREDNISONE 20 MG PO TABS
40.0000 mg | ORAL_TABLET | Freq: Every day | ORAL | Status: DC
  Administered 2024-11-29 – 2024-12-04 (×6): 40 mg via ORAL
  Filled 2024-11-28 (×6): qty 2

## 2024-11-28 MED ORDER — FUROSEMIDE 10 MG/ML IJ SOLN
40.0000 mg | Freq: Two times a day (BID) | INTRAMUSCULAR | Status: DC
  Administered 2024-11-28 (×2): 40 mg via INTRAVENOUS
  Filled 2024-11-28 (×2): qty 4

## 2024-11-28 MED ORDER — AMIODARONE HCL IN DEXTROSE 360-4.14 MG/200ML-% IV SOLN
30.0000 mg/h | INTRAVENOUS | Status: DC
  Administered 2024-11-29 – 2024-11-30 (×5): 30 mg/h via INTRAVENOUS
  Filled 2024-11-28 (×6): qty 200

## 2024-11-28 MED ORDER — OSELTAMIVIR PHOSPHATE 30 MG PO CAPS
30.0000 mg | ORAL_CAPSULE | Freq: Two times a day (BID) | ORAL | Status: DC
  Administered 2024-11-29 (×2): 30 mg via ORAL
  Filled 2024-11-28 (×2): qty 1

## 2024-11-28 MED ORDER — FUROSEMIDE 10 MG/ML IJ SOLN: 40.0000 mg | Freq: Two times a day (BID) | INTRAMUSCULAR | Status: DC

## 2024-11-28 MED ORDER — AMIODARONE HCL IN DEXTROSE 360-4.14 MG/200ML-% IV SOLN
60.0000 mg/h | INTRAVENOUS | Status: DC
  Administered 2024-11-28 – 2024-11-29 (×2): 60 mg/h via INTRAVENOUS
  Filled 2024-11-28: qty 200

## 2024-11-28 MED ORDER — TAMSULOSIN HCL 0.4 MG PO CAPS
0.4000 mg | ORAL_CAPSULE | Freq: Every day | ORAL | Status: DC
  Administered 2024-11-29 – 2024-12-03 (×5): 0.4 mg via ORAL
  Filled 2024-11-28 (×5): qty 1

## 2024-11-28 MED ORDER — SERTRALINE HCL 100 MG PO TABS
200.0000 mg | ORAL_TABLET | Freq: Every day | ORAL | Status: DC
  Administered 2024-11-29 – 2024-12-04 (×6): 200 mg via ORAL
  Filled 2024-11-28 (×6): qty 2

## 2024-11-28 MED ORDER — ALBUTEROL SULFATE (2.5 MG/3ML) 0.083% IN NEBU
2.5000 mg | INHALATION_SOLUTION | RESPIRATORY_TRACT | Status: DC | PRN
  Administered 2024-11-29 – 2024-11-30 (×3): 2.5 mg via RESPIRATORY_TRACT
  Filled 2024-11-28 (×3): qty 3

## 2024-11-28 MED ORDER — ACETAMINOPHEN 325 MG PO TABS
650.0000 mg | ORAL_TABLET | Freq: Once | ORAL | Status: AC
  Administered 2024-11-28: 650 mg via ORAL
  Filled 2024-11-28: qty 2

## 2024-11-28 MED ORDER — ROPINIROLE HCL 1 MG PO TABS: 1.0000 mg | ORAL_TABLET | Freq: Every evening | ORAL | Status: DC | PRN

## 2024-11-28 MED ORDER — SODIUM CHLORIDE 0.9 % IV BOLUS (SEPSIS)
1000.0000 mL | Freq: Once | INTRAVENOUS | Status: AC
  Administered 2024-11-28: 1000 mL via INTRAVENOUS

## 2024-11-28 MED ORDER — TRIMETHOBENZAMIDE HCL 100 MG/ML IM SOLN: 200.0000 mg | Freq: Four times a day (QID) | INTRAMUSCULAR | Status: DC | PRN

## 2024-11-28 MED ORDER — INSULIN GLARGINE 100 UNIT/ML ~~LOC~~ SOLN
50.0000 [IU] | Freq: Every day | SUBCUTANEOUS | Status: AC
  Administered 2024-11-28 – 2024-12-03 (×6): 50 [IU] via SUBCUTANEOUS
  Filled 2024-11-28 (×7): qty 0.5

## 2024-11-28 MED ORDER — APIXABAN 5 MG PO TABS
5.0000 mg | ORAL_TABLET | Freq: Two times a day (BID) | ORAL | Status: DC
  Administered 2024-11-28 – 2024-12-04 (×13): 5 mg via ORAL
  Filled 2024-11-28 (×13): qty 1

## 2024-11-28 MED ORDER — INSULIN ASPART 100 UNIT/ML IJ SOLN
0.0000 [IU] | Freq: Every day | INTRAMUSCULAR | Status: DC
  Administered 2024-11-28: 1 [IU] via SUBCUTANEOUS
  Administered 2024-12-02 – 2024-12-03 (×2): 2 [IU] via SUBCUTANEOUS
  Filled 2024-11-28: qty 1
  Filled 2024-11-28 (×2): qty 2

## 2024-11-28 MED ORDER — GUAIFENESIN ER 600 MG PO TB12
600.0000 mg | ORAL_TABLET | Freq: Two times a day (BID) | ORAL | Status: DC
  Administered 2024-11-28 – 2024-12-04 (×13): 600 mg via ORAL
  Filled 2024-11-28 (×13): qty 1

## 2024-11-28 MED ORDER — HYDROCODONE-ACETAMINOPHEN 10-325 MG PO TABS
1.0000 | ORAL_TABLET | Freq: Two times a day (BID) | ORAL | Status: DC | PRN
  Administered 2024-11-29 – 2024-12-03 (×5): 1 via ORAL
  Filled 2024-11-28 (×5): qty 1

## 2024-11-28 MED ORDER — LORAZEPAM 0.5 MG PO TABS
0.5000 mg | ORAL_TABLET | Freq: Every evening | ORAL | Status: DC | PRN
  Administered 2024-11-29 – 2024-12-02 (×3): 0.5 mg via ORAL
  Filled 2024-11-28 (×3): qty 1

## 2024-11-28 MED ORDER — ACETAMINOPHEN 325 MG PO TABS: 650.0000 mg | ORAL_TABLET | Freq: Four times a day (QID) | ORAL | Status: DC | PRN

## 2024-11-28 MED ORDER — POLYVINYL ALCOHOL 1.4 % OP SOLN
1.0000 [drp] | OPHTHALMIC | Status: DC | PRN
  Filled 2024-11-28: qty 15

## 2024-11-28 MED ORDER — OSELTAMIVIR PHOSPHATE 30 MG PO CAPS
30.0000 mg | ORAL_CAPSULE | Freq: Once | ORAL | Status: AC
  Administered 2024-11-28: 30 mg via ORAL
  Filled 2024-11-28: qty 1

## 2024-11-28 NOTE — H&P (Signed)
 " History and Physical    Patient: Shawn Sullivan FMW:[id number] DOB: 09/14/1946 DOA: 11/28/2024 DOS: the patient was seen and examined on 11/28/2024 PCP: Sherre Clapper, MD  Patient coming from: Home  Chief Complaint:  Chief Complaint  Patient presents with   Shortness of Breath   HPI: Shawn Sullivan is a 78 y.o. male with medical history significant of hypertension, hyperlipidemia, heart failure with preserved ejection fraction, diabetes mellitus type 2, COPD, chronic back pain, CKD stage IIIb, anxiety and depression, morbid obesity, and OSA not on CPAP who presents with shortness of breath. He is accompanied by his son.  He has been experiencing shortness of breath for approximately one week, accompanied by wheezing and difficulty breathing, which have significantly impacted his mobility, rendering him unable to walk. No use of oxygen or CPAP at night is reported.  He has a cough with sputum production, fever, and chills. A test confirmed Influenza A. He mentions recent exposure to sick family members, specifically his brother-in-law and sister.  He has a history of heart failure and reports increased swelling in his legs and feet. He has been using lotion to manage the swelling. He also mentions having atrial fibrillation and is currently taking Eliquis .  He has a history of COPD and reports significant wheezing. No history of smoking or asthma.  He is experiencing knee problems, which have required the use of heated braces for support. He has not attended physical therapy in the past few weeks due to the holidays.  He reports issues with his right eye, although he is not currently using any specific eye drops or antibiotics for this condition.   With EMS as O2 saturations noted to be 87% on room air.  Patient given DuoNebs and Solu-Medrol 125 mg IV.    In the emergency department patient was noted to be febrile up to 101.7 F with heart rates elevated into the 150s in atrial  fibrillation, blood pressures currently maintained, and O2 saturations currently maintained on 5L of humidified nasal cannula oxygen.  Labs significant for proBNP 2909, lactic acid 2.1, platelets 94, BUN 22,  creatinine 1.58, and glucose 146.Chest x-ray noting coarsened interstitial markings and perihilar vascular fullness without overt edema and cardiomegaly.  Influenza A screening was positive.  Blood cultures were obtained.  Patient had been given 1 L of normal saline IV fluids, acetaminophen  650 mg p.o., started on diltiazem  drip, and Tamiflu  30 mg p.o.   Review of Systems: As mentioned in the history of present illness. All other systems reviewed and are negative. Past Medical History:  Diagnosis Date   Acquired thrombophilia 12/24/2021   Acute cough 08/23/2023   Acute recurrent frontal sinusitis 08/23/2023   Anticoagulated 07/01/2018   Anticoagulated 07/01/2018   Aortic atherosclerosis 02/06/2024   Atrial fibrillation, chronic (HCC) 05/31/2020   Benign essential hypertension 05/06/2013   Bilateral pseudophakia 12/16/2015   Cellulitis of scrotum 02/06/2024   Chronic combined systolic and diastolic congestive heart failure (HCC) 02/06/2024   Chronic obstructive pulmonary disease (HCC) 11/05/2015   Chronic pain syndrome 08/30/2017   CKD stage 3b, GFR 30-44 ml/min (HCC) 07/01/2018   Class 3 severe obesity due to excess calories with serious comorbidity and body mass index (BMI) of 40.0 to 44.9 in adult [facility]) 10/04/2017   COPD (chronic obstructive pulmonary disease) (HCC) 07/01/2018   Depressive disorder 05/06/2013   Dermatochalasis 12/16/2015   Diabetic polyneuropathy (HCC) 02/23/2020   Disorder of bursae and tendons in shoulder region 01/05/2014   Edema 11/04/2015  Formatting of this note might be different from the original.  proBNP is low, 83, Echo wo findings of heart failure     Encounter for immunization 09/12/2023   Encounter for screening for pneumonia 05/06/2023    Gastroesophageal reflux disease without esophagitis 02/23/2020   Generalized edema 06/22/2016   Hyperbilirubinemia 02/06/2024   Hyperlipidemia 12/16/2015   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 stage 3b chronic kidney disease (HCC) 06/14/2019   Hypertensive heart disease 07/01/2018   Low back pain 08/17/2013   Lumbosacral spondylosis 05/06/2013   Mild protein malnutrition 02/06/2024   Obstructive sleep apnea 07/01/2018   On amiodarone  therapy 03/24/2019   PAF (paroxysmal atrial fibrillation) (HCC) 07/01/2018   Persistent atrial fibrillation (HCC) 07/01/2018   Piriformis syndrome 05/03/2014   Postlaminectomy syndrome, lumbar region 05/06/2013   Presbyopia of both eyes 12/16/2015   Restless legs syndrome 03/23/2016   Restrictive lung disease 06/22/2016   Retroperitoneal mass 02/06/2024   Seasonal allergic rhinitis due to pollen 09/12/2023   SI (sacroiliac) pain 08/12/2020   Sleep apnea 05/06/2013   Splenomegaly 02/06/2024   Status post intraocular lens implant 12/16/2015   Thrombocytopenia 02/06/2024   Type 2 diabetes mellitus without complications (HCC) 11/05/2015   Uncomplicated opioid dependence (HCC) 12/24/2021   Past Surgical History:  Procedure Laterality Date   APPENDECTOMY     BACK SURGERY     CARDIOVERSION N/A 12/01/2018   Procedure: CARDIOVERSION;  Surgeon: Francyne Headland, MD;  Location: [facility] ENDOSCOPY;  Service: Cardiovascular;  Laterality: N/A;   CATARACT EXTRACTION Bilateral    LUMBAR FUSION     NECK SURGERY     Social History:  reports that he quit smoking about 33 years ago. His smoking use included cigarettes. He has never used smokeless tobacco. He reports that he does not currently use alcohol. He reports that he does not currently use drugs after having used the following drugs: Hydrocodone.  Allergies[1]  Family History  Problem Relation Age of Onset   Atrial fibrillation Mother     Cancer Mother        vaginal cancer   Heart disease Father     Prior to Admission medications  Medication Sig Start Date End Date Taking? Authorizing Provider  acetaminophen  (TYLENOL ) 500 MG tablet Take 1,000 mg by mouth every 6 (six) hours as needed for moderate pain or headache.    [provider]  amiodarone  (PACERONE ) 200 MG tablet TAKE 1 TABLET BY MOUTH ONCE DAILY IN THE EVENING 11/20/24   Cox, Abigail, MD  apixaban  (ELIQUIS ) 5 MG TABS tablet Take 1 tablet (5 mg total) by mouth 2 (two) times daily. 08/21/24   CoxAbigail, MD  budesonide-glycopyrrolate-formoterol  (BREZTRI  AEROSPHERE) 160-9-4.8 MCG/ACT AERO inhaler Inhale 2 puffs into the lungs 2 (two) times daily. 05/08/24   CoxAbigail, MD  cholecalciferol (VITAMIN D3) 25 MCG (1000 UT) tablet Take 1,000 Units by mouth daily.     [provider]  dapagliflozin  propanediol (FARXIGA ) 10 MG TABS tablet Take 1 tablet (10 mg total) by mouth daily before breakfast. 05/08/24   Cox, Kirsten, MD  HYDROcodone-acetaminophen  [facility]) 10-325 MG tablet Take 1-2 tablets by mouth 2 (two) times daily as needed for moderate pain (pain score 4-6). 05/17/17   [provider]  icosapent  Ethyl (VASCEPA ) 1 g capsule Take 2 capsules (2 g total) by mouth 2 (two) times daily. 09/09/23   CoxAbigail, MD  insulin  aspart (NOVOLOG  FLEXPEN) 100 UNIT/ML FlexPen Recommend increase preprandial (before  meals) insulin  novolog  Check sugars before meals Take 6 U prior to breakfast, lunch, and supper. + 1 u if sugar 150-200. + 2 u if sugar between 201-250 + 3 U if sugar between 251-300 + 4 U if sugar between 301-350 + 5 U if sugar between 351-400 + 6 U if sugar > 400. 05/17/24   Cox, Kirsten, MD  insulin  degludec (TRESIBA  FLEXTOUCH) 100 UNIT/ML FlexTouch Pen Inject 60 Units into the skin every evening. Patient taking differently: Inject 70 Units into the skin every evening.    [provider]  ipratropium-albuterol  (DUONEB) 0.5-2.5 (3) MG/3ML SOLN  INHALE CONTENTS OF 1 VIAL VIA NEBULIZER EVERY 6 HOURS AS NEEDED FOR SHORTNESS OF BREATH 02/23/24   Cox, Abigail, MD  levocetirizine (XYZAL) 5 MG tablet Take 5 mg by mouth daily as needed for allergies.    [provider]  LORazepam  (ATIVAN ) 0.5 MG tablet Take 1 tablet (0.5 mg total) by mouth at bedtime as needed for anxiety or sleep. 10/25/24   Nicholaus Credit, PA-C  metolazone  (ZAROXOLYN ) 5 MG tablet Take 5 mg by mouth once a week. May take an additional 5 mg tablet for swelling if needed. 04/26/20   [provider]  metoprolol  tartrate (LOPRESSOR ) 50 MG tablet TAKE 1 TABLET BY MOUTH EVERY MORNING, TAKE 1 TABLET IN THE EVENING, AND TAKE 1 TABLET AT BEDTIME. 11/21/24   Cox, Kirsten, MD  montelukast  (SINGULAIR ) 10 MG tablet TAKE 1 TABLET BY MOUTH EVERY DAY AT BEDTIME 01/24/24   Cox, Kirsten, MD  nystatin  (MYCOSTATIN /NYSTOP ) powder Apply 1 Application topically 3 (three) times daily. 02/15/24   Sherre Abigail, MD  Omeprazole 20 MG TBEC Take 20 mg by mouth 2 (two) times daily before a meal.  06/25/14   [provider]  potassium chloride  SA (KLOR-CON  M) 20 MEQ tablet TAKE 2 TABLETS BY MOUTH TWICE DAILY AT BREAKFAST AND AT BEDTIME 09/20/24   Cox, Kirsten, MD  rOPINIRole  (REQUIP ) 1 MG tablet Take 1 tablet (1 mg total) by mouth at bedtime. Patient taking differently: Take 1 mg by mouth at bedtime as needed (restless legs). 09/07/23   Cox, Abigail, MD  rosuvastatin  (CRESTOR ) 40 MG tablet TAKE 1 TABLET BY MOUTH DAILY AT BEDTIME 09/20/24   Cox, Kirsten, MD  Semaglutide , 1 MG/DOSE, (OZEMPIC , 1 MG/DOSE,) 2 MG/1.5ML SOPN Inject 1 mg into the skin once a week. 03/24/23   CoxAbigail, MD  sertraline  (ZOLOFT ) 100 MG tablet Take 2 tablets (200 mg total) by mouth daily. 08/15/24   Cox, Abigail, MD  tamsulosin  (FLOMAX ) 0.4 MG CAPS capsule TAKE 1 CAPSULE BY MOUTH EVERY DAY AT BEDTIME 01/24/24   Cox, Kirsten, MD  tiZANidine  (ZANAFLEX ) 2 MG tablet TAKE 1 TABLET BY MOUTH DAILY AT BEDTIME 09/20/24   Cox,  Kirsten, MD  torsemide  (DEMADEX ) 20 MG tablet TAKE 2 TABLETS BY MOUTH TWICE DAILY EACH MORNING AND AT NOON 09/20/24   Sherre Abigail, MD  VENTOLIN  HFA 108 [phone] Base) MCG/ACT inhaler INHALE 2 PUFFS BY MOUTH EVERY 6 HOURS AS NEEDED AS NEEDED FOR SHORTNESS OF BREATH/WHEEZING 05/25/24   Sherre Abigail, MD    Physical Exam: Vitals:   11/28/24 0600 11/28/24 0615 11/28/24 0630 11/28/24 0645  BP: (!) 140/62 130/78 (!) 137/90 (!) 144/68  Pulse: (!) 141 96 (!) 135 (!) 143  Resp:      Temp:      SpO2: 96% 93% 94% 94%  Weight:      Height:       Constitutional: Elderly obese male who  appears to be acutely ill Eyes: PERRL, conjunctival injection and irritation noted of the right eye ENMT: Mucous membranes are moist.  Hard of hearing. Neck: Supple.  Unable to assess for JVD Respiratory: Decreased aeration with expiratory wheezes and rhonchi appreciated.  Patient on nasal cannula oxygen Cardiovascular: Regular rate and rhythm, no murmurs / rubs / gallops.  At least 3+ pitting edema noted of lower extremities. Abdomen: Protuberant abdomen no tenderness to palpation. Bowel sounds positive.  Musculoskeletal: no clubbing / cyanosis. No joint deformity upper and lower extremities. Good ROM, no contractures. Normal muscle tone.  Skin: Venous stasis changes of the bilateral lower extremities. Neurologic: CN 2-12 grossly intact. Strength 5/5 in all 4.  Psychiatric: Normal judgment and insight. Alert and oriented x 3. Normal mood.   Data Reviewed:  EKG revealed atrial fibrillation with RVR at 144 bpm.  Reviewed labs, imaging, and pertinent records as documented.  Assessment and Plan:  Acute respiratory failure with hypoxia Sepsis secondary to influenza Patient presented with 2-day history of fever and shortness of breath.  Found to be febrile up to 101.7 F with tachycardia meeting SIRS criteria.  O2 saturations noted to be as low as 87% on room air with EMS for which patient was initially placed on 10 L of  nasal cannula oxygen.  Chest x-ray noting coarsened interstitial markings and perihilar vascular fullness without overt edema and cardiomegaly.  Influenza A screening positive.  Initial lactic acid elevated 2.1. - Admit to progressive bed - Continuous pulse oximetry with nasal cannula oxygen maintain O2 saturation greater than 90% - Incentive spirometry and flutter valve - Check procalcitonin - Continue Tamiflu  - Acetaminophen  as needed for fever  Paroxysmal atrial fibrillation on chronic anticoagulation On admission patient noted to be in A-fib with RVR with heart rates into the 150s.  Patient had initially been placed on Cardizem  drip without improvement in heart rates. - Goal potassium at least 4 and magnesium  at least 2.  Replace electrolytes to goal - Continue Eliquis  - Changed to amiodarone  drip - Check echocardiogram  COPD exacerbation Patient noted to have expiratory wheezes on physical exam.  Patient had received Solu-Medrol IV. - DuoNebs scheduled and albuterol  nebs  as needed - Prednisone  - Empiric antibiotics of Rocephin   Heart failure with preserved ejection fraction  proBNP elevated 2909.  Chest x-ray noting cardiomegaly without overt pulmonary edema but coarsened interstitial markings and perihilar vascular fullness. - Strict I&O's and daily weights - Check echocardiogram - Lasix  IV 40 mg IV twice daily x 2 doses.  Reassess and determine need of further IV diuresis in a.m.  Diabetes mellitus type 2, with long-term use of insulin  Last available hemoglobin A1c noted to be 7.1 when checked 08/15/2024. - Hypoglycemic protocols - CBGs before every meal with sensitive SSI - Continue home long-acting insulin  - Adjust insulin  regimen as deemed medically appropriate  Conjunctivitis of the right eye Possibly viral - Artificial tears  Thrombocytopenia Acute on chronic.  Platelet count noted to be 94.  Review of records notes that platelet counts have intermittently been low  in the past. - Continue to monitor  Anxiety and depression - Continue Zoloft  and Ativan  as needed  Chronic kidney disease stage III B Creatinine noted to be 1.58 which is around patient's baseline. - Continue to monitor with diuresis  GERD - Continue pharmacy substitution of Protonix   Morbid obesity BMI 45.37 kg/m.  Patient on semaglutide  in outpatient setting.  DVT prophylaxis: Eliquis  Advance Care Planning:   Code Status: Full Code  Consults: None  Family Communication: Son updated at bedside  Severity of Illness: The appropriate patient status for this patient is INPATIENT. Inpatient status is judged to be reasonable and necessary in order to provide the required intensity of service to ensure the patient's safety. The patient's presenting symptoms, physical exam findings, and initial radiographic and laboratory data in the context of their chronic comorbidities is felt to place them at high risk for further clinical deterioration. Furthermore, it is not anticipated that the patient will be medically stable for discharge from the hospital within 2 midnights of admission.   * I certify that at the point of admission it is my clinical judgment that the patient will require inpatient hospital care spanning beyond 2 midnights from the point of admission due to high intensity of service, high risk for further deterioration and high frequency of surveillance required.*  Author: Maximino DELENA Sharps, MD 11/28/2024 7:16 AM  For on call review [email or web address].      [1]  Allergies Allergen Reactions   Ambien [Zolpidem]     Made him crazy   Androgel [Testosterone] Other (See Comments)    Pedal edema    Biaxin [Clarithromycin] Other (See Comments)    Taste perception alteration   Duloxetine Other (See Comments)    Hallucinations   Gabapentin Other (See Comments)    hallucinations   Ibuprofen Other (See Comments)    GI upset    Lyrica [Pregabalin] Swelling   Spironolactone  Other (See Comments)    Unknown    "

## 2024-11-28 NOTE — ED Notes (Signed)
Lab results was reported to Nurse. 

## 2024-11-28 NOTE — ED Notes (Signed)
 Lactic results to amanda r.rn by at

## 2024-11-28 NOTE — ED Provider Notes (Signed)
 " [place] EMERGENCY DEPARTMENT AT [facility] Provider Note   CSN: [id number] Arrival date & time: 11/28/24  0445     Patient presents with: Shortness of Breath   Shawn Sullivan is a 78 y.o. male.   The history is provided by the patient.   Patient with extensive history including CHF, atrial fibrillation, COPD presents with shortness of breath.  Patient reports for the past week he has had increasing shortness of breath.  He also reports associated fever, cough, sore throat and bodyaches.  He is concerned he has the flu.  He is not vaccinated.  No vomiting or diarrhea.  No chest pain.  He is a former smoker  He is not on home oxygen.  EMS had reported patient was 87% pulse ox on room air on arrival.  He has been given nebulizers and Solu-Medrol with some improvement.  Patient was noted to be in A-fib with rapid ventricular response   Past Medical History:  Diagnosis Date   Acquired thrombophilia 12/24/2021   Acute cough 08/23/2023   Acute recurrent frontal sinusitis 08/23/2023   Anticoagulated 07/01/2018   Anticoagulated 07/01/2018   Aortic atherosclerosis 02/06/2024   Atrial fibrillation, chronic (HCC) 05/31/2020   Benign essential hypertension 05/06/2013   Bilateral pseudophakia 12/16/2015   Cellulitis of scrotum 02/06/2024   Chronic combined systolic and diastolic congestive heart failure (HCC) 02/06/2024   Chronic obstructive pulmonary disease (HCC) 11/05/2015   Chronic pain syndrome 08/30/2017   CKD stage 3b, GFR 30-44 ml/min (HCC) 07/01/2018   Class 3 severe obesity due to excess calories with serious comorbidity and body mass index (BMI) of 40.0 to 44.9 in adult (HCC) 10/04/2017   COPD (chronic obstructive pulmonary disease) (HCC) 07/01/2018   Depressive disorder 05/06/2013   Dermatochalasis 12/16/2015   Diabetic polyneuropathy (HCC) 02/23/2020   Disorder of bursae and tendons in shoulder region 01/05/2014   Edema 11/04/2015   Formatting of this  note might be different from the original.  proBNP is low, 83, Echo wo findings of heart failure     Encounter for immunization 09/12/2023   Encounter for screening for pneumonia 05/06/2023   Gastroesophageal reflux disease without esophagitis 02/23/2020   Generalized edema 06/22/2016   Hyperbilirubinemia 02/06/2024   Hyperlipidemia 12/16/2015   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 stage 3b chronic kidney disease (HCC) 06/14/2019   Hypertensive heart disease 07/01/2018   Low back pain 08/17/2013   Lumbosacral spondylosis 05/06/2013   Mild protein malnutrition 02/06/2024   Obstructive sleep apnea 07/01/2018   On amiodarone  therapy 03/24/2019   PAF (paroxysmal atrial fibrillation) (HCC) 07/01/2018   Persistent atrial fibrillation (HCC) 07/01/2018   Piriformis syndrome 05/03/2014   Postlaminectomy syndrome, lumbar region 05/06/2013   Presbyopia of both eyes 12/16/2015   Restless legs syndrome 03/23/2016   Restrictive lung disease 06/22/2016   Retroperitoneal mass 02/06/2024   Seasonal allergic rhinitis due to pollen 09/12/2023   SI (sacroiliac) pain 08/12/2020   Sleep apnea 05/06/2013   Splenomegaly 02/06/2024   Status post intraocular lens implant 12/16/2015   Thrombocytopenia 02/06/2024   Type 2 diabetes mellitus without complications (HCC) 11/05/2015   Uncomplicated opioid dependence (HCC) 12/24/2021    Prior to Admission medications  Medication Sig Start Date End Date Taking? Authorizing Provider  acetaminophen  (TYLENOL ) 500 MG tablet Take 1,000 mg by mouth every 6 (six) hours as needed for moderate pain or headache.    [provider]  amiodarone  (PACERONE ) 200  MG tablet TAKE 1 TABLET BY MOUTH ONCE DAILY IN THE EVENING 11/20/24   Cox, Abigail, MD  apixaban  (ELIQUIS ) 5 MG TABS tablet Take 1 tablet (5 mg total) by mouth 2 (two) times daily. 08/21/24   Cox, Abigail, MD   budesonide-glycopyrrolate-formoterol  (BREZTRI  AEROSPHERE) 160-9-4.8 MCG/ACT AERO inhaler Inhale 2 puffs into the lungs 2 (two) times daily. 05/08/24   CoxAbigail, MD  cholecalciferol (VITAMIN D3) 25 MCG (1000 UT) tablet Take 1,000 Units by mouth daily.     [provider]  dapagliflozin  propanediol (FARXIGA ) 10 MG TABS tablet Take 1 tablet (10 mg total) by mouth daily before breakfast. 05/08/24   Cox, Kirsten, MD  HYDROcodone-acetaminophen  [facility]) 10-325 MG tablet Take 1-2 tablets by mouth 2 (two) times daily as needed for moderate pain (pain score 4-6). 05/17/17   [provider]  icosapent  Ethyl (VASCEPA ) 1 g capsule Take 2 capsules (2 g total) by mouth 2 (two) times daily. 09/09/23   CoxAbigail, MD  insulin  aspart (NOVOLOG  FLEXPEN) 100 UNIT/ML FlexPen Recommend increase preprandial (before meals) insulin  novolog  Check sugars before meals Take 6 U prior to breakfast, lunch, and supper. + 1 u if sugar 150-200. + 2 u if sugar between 201-250 + 3 U if sugar between 251-300 + 4 U if sugar between 301-350 + 5 U if sugar between 351-400 + 6 U if sugar > 400. 05/17/24   Cox, Kirsten, MD  insulin  degludec (TRESIBA  FLEXTOUCH) 100 UNIT/ML FlexTouch Pen Inject 60 Units into the skin every evening. Patient taking differently: Inject 70 Units into the skin every evening.    [provider]  ipratropium-albuterol  (DUONEB) 0.5-2.5 (3) MG/3ML SOLN INHALE CONTENTS OF 1 VIAL VIA NEBULIZER EVERY 6 HOURS AS NEEDED FOR SHORTNESS OF BREATH 02/23/24   Cox, Abigail, MD  levocetirizine (XYZAL) 5 MG tablet Take 5 mg by mouth daily as needed for allergies.    [provider]  LORazepam  (ATIVAN ) 0.5 MG tablet Take 1 tablet (0.5 mg total) by mouth at bedtime as needed for anxiety or sleep. 10/25/24   Nicholaus Credit, PA-C  metolazone  (ZAROXOLYN ) 5 MG tablet Take 5 mg by mouth once a week. May take an additional 5 mg tablet for swelling if needed. 04/26/20   [provider]  metoprolol   tartrate (LOPRESSOR ) 50 MG tablet TAKE 1 TABLET BY MOUTH EVERY MORNING, TAKE 1 TABLET IN THE EVENING, AND TAKE 1 TABLET AT BEDTIME. 11/21/24   Cox, Kirsten, MD  montelukast  (SINGULAIR ) 10 MG tablet TAKE 1 TABLET BY MOUTH EVERY DAY AT BEDTIME 01/24/24   Cox, Kirsten, MD  nystatin  (MYCOSTATIN /NYSTOP ) powder Apply 1 Application topically 3 (three) times daily. 02/15/24   Sherre Abigail, MD  Omeprazole 20 MG TBEC Take 20 mg by mouth 2 (two) times daily before a meal.  06/25/14   [provider]  potassium chloride  SA (KLOR-CON  M) 20 MEQ tablet TAKE 2 TABLETS BY MOUTH TWICE DAILY AT BREAKFAST AND AT BEDTIME 09/20/24   Cox, Abigail, MD  rOPINIRole  (REQUIP ) 1 MG tablet Take 1 tablet (1 mg total) by mouth at bedtime. Patient taking differently: Take 1 mg by mouth at bedtime as needed (restless legs). 09/07/23   Cox, Abigail, MD  rosuvastatin  (CRESTOR ) 40 MG tablet TAKE 1 TABLET BY MOUTH DAILY AT BEDTIME 09/20/24   Cox, Kirsten, MD  Semaglutide , 1 MG/DOSE, (OZEMPIC , 1 MG/DOSE,) 2 MG/1.5ML SOPN Inject 1 mg into the skin once a week. 03/24/23   CoxAbigail, MD  sertraline  (ZOLOFT ) 100 MG tablet Take 2  tablets (200 mg total) by mouth daily. 08/15/24   Cox, Abigail, MD  tamsulosin  (FLOMAX ) 0.4 MG CAPS capsule TAKE 1 CAPSULE BY MOUTH EVERY DAY AT BEDTIME 01/24/24   Cox, Kirsten, MD  tiZANidine  (ZANAFLEX ) 2 MG tablet TAKE 1 TABLET BY MOUTH DAILY AT BEDTIME 09/20/24   Cox, Kirsten, MD  torsemide  (DEMADEX ) 20 MG tablet TAKE 2 TABLETS BY MOUTH TWICE DAILY EACH MORNING AND AT NOON 09/20/24   CoxAbigail, MD  VENTOLIN  HFA 108 (90 Base) MCG/ACT inhaler INHALE 2 PUFFS BY MOUTH EVERY 6 HOURS AS NEEDED AS NEEDED FOR SHORTNESS OF BREATH/WHEEZING 05/25/24   Cox, Abigail, MD    Allergies: Ambien [zolpidem], Androgel [testosterone], Biaxin [clarithromycin], Duloxetine, Gabapentin, Ibuprofen, Lyrica [pregabalin], and Spironolactone    Review of Systems  Constitutional:  Positive for fatigue and fever.  Respiratory:   Positive for cough and shortness of breath.     Updated Vital Signs BP (!) 144/68   Pulse (!) 143   Temp (!) 101.7 F (38.7 C)   Resp 16   Ht 1.854 m (6' 1)   Wt (!) 156 kg   SpO2 94%   BMI 45.37 kg/m   Physical Exam CONSTITUTIONAL: Elderly and ill-appearing HEAD: Normocephalic/atraumatic EYES: EOMI/PERRL ENMT: Mucous membranes moist, no stridor or drooling NECK: supple no meningeal signs CV: S1/S2 noted, tachycardic and irregular LUNGS: Mild tachypnea, wheezing bilaterally, getting neb treatment ABDOMEN: soft, nontender, obese NEURO: Pt is awake/alert/appropriate, moves all extremitiesx4.  No facial droop.   EXTREMITIES: Chronic symmetric pitting edema to bilateral lower extremities.  Overlying erythema is noted SKIN: warm, color normal  (all labs ordered are listed, but only abnormal results are displayed) Labs Reviewed  RESP PANEL BY RT-PCR (RSV, FLU A&B, COVID)  RVPGX2 - Abnormal; Notable for the following components:      Result Value   Influenza A by PCR POSITIVE (*)    All other components within normal limits  CBC - Abnormal; Notable for the following components:   RDW 15.9 (*)    Platelets 94 (*)    All other components within normal limits  BASIC METABOLIC PANEL WITH GFR - Abnormal; Notable for the following components:   Glucose, Bld 146 (*)    Creatinine, Ser 1.58 (*)    GFR, Estimated 44 (*)    All other components within normal limits  PRO BRAIN NATRIURETIC PEPTIDE - Abnormal; Notable for the following components:   Pro Brain Natriuretic Peptide 2,909.0 (*)    All other components within normal limits  I-STAT CG4 LACTIC ACID, ED - Abnormal; Notable for the following components:   Lactic Acid, Venous 2.1 (*)    All other components within normal limits  CULTURE, BLOOD (ROUTINE X 2)  CULTURE, BLOOD (ROUTINE X 2)  PROTIME-INR  I-STAT CG4 LACTIC ACID, ED    EKG: EKG Interpretation Date/Time:  Tuesday November 28 2024 04:51:29 EST Ventricular Rate:   144 PR Interval:    QRS Duration:  149 QT Interval:  330 QTC Calculation: 511 R Axis:   269  Text Interpretation: atrial fibrillation with RVR Non-specific intra-ventricular conduction delay No significant change since last tracing Confirmed by Midge Golas ([id number]) on 11/28/2024 4:57:59 AM  Radiology: [facility] Chest Port 1 View Result Date: 11/28/2024 EXAM: 1 VIEW(S) XRAY OF THE CHEST 11/28/2024 05:24:00 AM COMPARISON: 07/22/2024 CLINICAL HISTORY: sob FINDINGS: LUNGS AND PLEURA: Coarsened interstitial markings. Perihilar vascular fullness without overt edema. No pleural effusion. No pneumothorax. HEART AND MEDIASTINUM: Hiatal hernia identified. Cardiomegaly. Aortic atherosclerosis. BONES AND SOFT  TISSUES: No acute osseous abnormality. IMPRESSION: 1. Coarsened interstitial markings and perihilar vascular fullness without overt edema. 2. Cardiomegaly. Electronically signed by: Waddell Calk MD 11/28/2024 05:48 AM EST RP Workstation: [id number]     .Critical Care  Performed by: Midge Golas, MD Authorized by: Midge Golas, MD   Critical care provider statement:    Critical care time (minutes):  75   Critical care start time:  11/28/2024 5:15 AM   Critical care end time:  11/28/2024 6:30 AM   Critical care time was exclusive of:  Separately billable procedures and treating other patients   Critical care was necessary to treat or prevent imminent or life-threatening deterioration of the following conditions:  Respiratory failure, cardiac failure and sepsis   Critical care was time spent personally by me on the following activities:  Obtaining history from patient or surrogate, evaluation of patient's response to treatment, development of treatment plan with patient or surrogate, pulse oximetry, ordering and review of radiographic studies, ordering and review of laboratory studies, ordering and performing treatments and interventions, re-evaluation of patient's condition, review of old  charts and examination of patient   I assumed direction of critical care for this patient from another provider in my specialty: no      Medications Ordered in the ED  diltiazem  (CARDIZEM ) 125 mg in dextrose  5% 125 mL (1 mg/mL) infusion (5 mg/hr Intravenous New Bag/Given 11/28/24 0654)  oseltamivir  (TAMIFLU ) capsule 30 mg (has no administration in time range)  acetaminophen  (TYLENOL ) tablet 650 mg (650 mg Oral Given 11/28/24 0531)  sodium chloride  0.9 % bolus 1,000 mL (1,000 mLs Intravenous New Bag/Given 11/28/24 0615)    Clinical Course as of 11/28/24 0715  Tue Nov 28, 2024  0558 Patient presents with flulike illness for the past week.  On exam patient is tachycardic, tachypneic and wheezing. Suspect he has underlying influenza that is triggered this episode. On reassessment patient does have improvement of his symptoms from the albuterol  and steroids from EMS.  His wheezing is improving and his work of breathing is improving.  However he is still tachycardic. Workup is pending at this time [DW]  0608 Lactate mildly elevated.  Will give a fluid bolus. Will withhold antibiotics until viral panel is complete as this is likely influenza [DW]  0700 Patient is positive for influenza Patient likely had a COPD exacerbation and was triggered by influenza.  Patient will need to be admitted for acute respiratory failure as well as atrial fibrillation with RVR [DW]  [phone] Signed out to dr plunkett at shift change to call report for admission [DW]    Clinical Course User Index [DW] Midge Golas, MD                                 Medical Decision Making Amount and/or Complexity of Data Reviewed Labs: ordered. Radiology: ordered.  Risk OTC drugs. Prescription drug management.   This patient presents to the ED for concern of shortness of breath, this involves an extensive number of treatment options, and is a complaint that carries with it a high risk of complications and morbidity.  The  differential diagnosis includes but is not limited to Acute coronary syndrome, pneumonia, acute pulmonary edema, pneumothorax, acute anemia, pulmonary embolism Influenza, COPD exacerbation  Comorbidities that complicate the patient evaluation: Patients presentation is complicated by their history of COPD, atrial fibrillation  Social Determinants of Health: Patients poor mobility and former tobacco use  increases the complexity of managing their presentation  Additional history obtained: Records reviewed previous admission documents  Lab Tests: I Ordered, and personally interpreted labs.  The pertinent results include:  renal insufficiency, positive for influenza  Imaging Studies ordered: I ordered imaging studies including X-ray chest  I independently visualized and interpreted imaging which showed no signs of pneumonia I agree with the radiologist interpretation  Cardiac Monitoring: The patient was maintained on a cardiac monitor.  I personally viewed and interpreted the cardiac monitor which showed an underlying rhythm of:  Atrial Fibrillation  Medicines ordered and prescription drug management: I ordered medication including cardizem    for atrial fibrillation    Critical Interventions:   admission for respiratory failure, Cardizem  drip  Reevaluation: After the interventions noted above, I reevaluated the patient and found that they have :improved  Complexity of problems addressed: Patients presentation is most consistent with  acute presentation with potential threat to life or bodily function  Disposition: After consideration of the diagnostic results and the patients response to treatment,  I feel that the patent would benefit from admission  .        Final diagnoses:  Atrial fibrillation with rapid ventricular response (HCC)  COPD exacerbation (HCC)  Acute respiratory failure with hypoxia [facility])  Influenza A    ED Discharge Orders     None           Midge Golas, MD 11/28/24 [phone]  "

## 2024-11-28 NOTE — ED Notes (Signed)
 CCMD contacted to put patient on cardiac monitoring.

## 2024-11-28 NOTE — ED Notes (Signed)
 Called floor and made aware of PT arrival.

## 2024-11-28 NOTE — ED Triage Notes (Signed)
 Pt bibgcems from home with c/o sob. Pt feeling sick x2 days. 87% upon ems arrival on room air. Duoneb 97% x3 duonebs. EMS also gave 125mg  solumedrol. Pt in afib rvr at 160. Hx afib, copd, chf.  Bp 150/68 Cbg 147

## 2024-11-28 NOTE — ED Notes (Signed)
 PT at bedside.

## 2024-11-28 NOTE — Evaluation (Signed)
 Physical Therapy Evaluation Patient Details Name: Shawn Sullivan MRN: [id number] DOB: 1946/02/04 Today's Date: 11/28/2024  History of Present Illness  Pt is 78 yo presenting to [facility] on 12/30 due to shortness of breath. Pt in a-fib with rapid ventricular response on arrival to ED.  PMH: CHF, A-fib, COPD  Clinical Impression  Pt is currently presenting at Mod A for bed mobility, Min a for sit to stand with RW from elevated surface and was able to side step. Heart rate was up to 128 bpm with standing prior to EKG leads falling off; replaced once back in bed with new stickers for safety. Pt son present during session; pt has some help at home but intermittently. Due to pt current functional status, home set up and available assistance at home recommending skilled physical therapy services < 3 hours/day in order to address strength, balance and functional mobility to decrease risk for falls, injury, immobility, skin break down and re-hospitalization.          If plan is discharge home, recommend the following: Help with stairs or ramp for entrance;Assist for transportation;Assistance with cooking/housework   Can travel by private vehicle   No    Equipment Recommendations Hospital bed  Recommendations for Other Services  OT consult    Functional Status Assessment Patient has had a recent decline in their functional status and demonstrates the ability to make significant improvements in function in a reasonable and predictable amount of time.     Precautions / Restrictions Precautions Precautions: Fall Recall of Precautions/Restrictions: Intact Precaution/Restrictions Comments: watch HR Restrictions Weight Bearing Restrictions Per Provider Order: No      Mobility  Bed Mobility Overal bed mobility: Needs Assistance Bed Mobility: Supine to Sit, Sit to Supine     Supine to sit: Mod assist Sit to supine: Mod assist   General bed mobility comments: mod A for trunk to mid line and  LE to EOB.    Transfers Overall transfer level: Needs assistance Equipment used: Rolling walker (2 wheels) Transfers: Sit to/from Stand Sit to Stand: Min assist           General transfer comment: Min A for sit to stand from elevated stretcher    Ambulation/Gait   Pre-gait activities: Min A for side stepping at stretcher with RW with increased time and very low floor clearance. HR up to 128 bpm with a-fib reporting 3x during activity        Balance Overall balance assessment: Needs assistance Sitting-balance support: Bilateral upper extremity supported, Feet unsupported Sitting balance-Leahy Scale: Fair     Standing balance support: Bilateral upper extremity supported, During functional activity, Reliant on assistive device for balance Standing balance-Leahy Scale: Poor         Pertinent Vitals/Pain Pain Assessment Pain Assessment: No/denies pain    Home Living Family/patient expects to be discharged to:: Private residence Living Arrangements: Children Available Help at Discharge: Family;Available 24 hours/day Type of Home: House Home Access: Stairs to enter Entrance Stairs-Rails: Doctor, General Practice of Steps: 5 front, 4 back   Home Layout: One level Home Equipment: Cane - single Librarian, Academic (2 wheels);Rollator (4 wheels);Wheelchair - manual;Shower seat;BSC/3in1;Electric scooter      Prior Function Prior Level of Function : Independent/Modified Independent;History of Falls (last six months)             Mobility Comments: Pt reports use of RW in home, scooter in community and 8-10 falls in 6 months. ADLs Comments: Family assists with cooking/cleaning.  Extremity/Trunk Assessment   Upper Extremity Assessment Upper Extremity Assessment: Defer to OT evaluation    Lower Extremity Assessment Lower Extremity Assessment: Generalized weakness    Cervical / Trunk Assessment Cervical / Trunk Assessment: Normal  Communication    Communication Communication: No apparent difficulties    Cognition Arousal: Alert Behavior During Therapy: WFL for tasks assessed/performed   PT - Cognitive impairments: Safety/Judgement       Following commands: Intact       Cueing Cueing Techniques: Verbal cues, Tactile cues     General Comments General comments (skin integrity, edema, etc.): son present during session        Assessment/Plan    PT Assessment Patient needs continued PT services  PT Problem List Decreased strength;Decreased balance;Decreased activity tolerance;Decreased mobility;Decreased safety awareness       PT Treatment Interventions DME instruction;Therapeutic activities;Gait training;Therapeutic exercise;Stair training;Balance training;Functional mobility training;Patient/family education    PT Goals (Current goals can be found in the Care Plan section)  Acute Rehab PT Goals Patient Stated Goal: to improve mobility and be more independent PT Goal Formulation: With patient Time For Goal Achievement: 12/12/24 Potential to Achieve Goals: Good    Frequency Min 1X/week        AM-PAC PT 6 Clicks Mobility  Outcome Measure Help needed turning from your back to your side while in a flat bed without using bedrails?: A Lot Help needed moving from lying on your back to sitting on the side of a flat bed without using bedrails?: A Lot Help needed moving to and from a bed to a chair (including a wheelchair)?: A Lot Help needed standing up from a chair using your arms (e.g., wheelchair or bedside chair)?: A Little Help needed to walk in hospital room?: A Lot Help needed climbing 3-5 steps with a railing? : Total 6 Click Score: 12    End of Session Equipment Utilized During Treatment: Gait belt;Oxygen Activity Tolerance: Patient tolerated treatment well Patient left: in bed;with call bell/phone within reach;with family/visitor present Nurse Communication: Mobility status PT Visit Diagnosis:  Unsteadiness on feet (R26.81);Other abnormalities of gait and mobility (R26.89);Repeated falls (R29.6);Muscle weakness (generalized) (M62.81)    Time: 8355-8292 PT Time Calculation (min) (ACUTE ONLY): 23 min   Charges:   PT Evaluation $PT Eval Low Complexity: 1 Low PT Treatments $Therapeutic Activity: 8-22 mins PT General Charges $$ ACUTE PT VISIT: 1 Visit        Dorothyann Maier, DPT, CLT  Acute Rehabilitation Services Office: [phone] (Secure chat preferred)   Dorothyann VEAR Maier 11/28/2024, 6:35 PM

## 2024-11-28 NOTE — ED Notes (Signed)
 IV with diltiazem  going occluded. IV removed.

## 2024-11-28 NOTE — ED Notes (Signed)
 CCMD called.

## 2024-11-29 ENCOUNTER — Inpatient Hospital Stay (HOSPITAL_COMMUNITY)

## 2024-11-29 DIAGNOSIS — I4891 Unspecified atrial fibrillation: Secondary | ICD-10-CM | POA: Diagnosis not present

## 2024-11-29 DIAGNOSIS — A419 Sepsis, unspecified organism: Secondary | ICD-10-CM | POA: Diagnosis not present

## 2024-11-29 DIAGNOSIS — J9601 Acute respiratory failure with hypoxia: Secondary | ICD-10-CM | POA: Diagnosis not present

## 2024-11-29 DIAGNOSIS — R6520 Severe sepsis without septic shock: Secondary | ICD-10-CM | POA: Diagnosis not present

## 2024-11-29 LAB — BLOOD CULTURE ID PANEL (REFLEXED) - BCID2

## 2024-11-29 LAB — ECHOCARDIOGRAM COMPLETE
Area-P 1/2: 3.65 cm2
Height: 73 in
S' Lateral: 4.4 cm
Weight: 5654.36 [oz_av]

## 2024-11-29 LAB — CBC
HCT: 42.7 % (ref 39.0–52.0)
Hemoglobin: 14.1 g/dL (ref 13.0–17.0)
MCH: 30.7 pg (ref 26.0–34.0)
MCHC: 33 g/dL (ref 30.0–36.0)
MCV: 92.8 fL (ref 80.0–100.0)
Platelets: 118 K/uL — ABNORMAL LOW (ref 150–400)
RBC: 4.6 MIL/uL (ref 4.22–5.81)
RDW: 15.8 % — ABNORMAL HIGH (ref 11.5–15.5)
WBC: 9.8 K/uL (ref 4.0–10.5)
nRBC: 0 % (ref 0.0–0.2)

## 2024-11-29 LAB — COMPREHENSIVE METABOLIC PANEL WITH GFR
ALT: 56 U/L — ABNORMAL HIGH (ref 0–44)
AST: 79 U/L — ABNORMAL HIGH (ref 15–41)
Albumin: 3.3 g/dL — ABNORMAL LOW (ref 3.5–5.0)
Alkaline Phosphatase: 106 U/L (ref 38–126)
Anion gap: 11 (ref 5–15)
BUN: 26 mg/dL — ABNORMAL HIGH (ref 8–23)
CO2: 27 mmol/L (ref 22–32)
Calcium: 9.2 mg/dL (ref 8.9–10.3)
Chloride: 101 mmol/L (ref 98–111)
Creatinine, Ser: 1.29 mg/dL — ABNORMAL HIGH (ref 0.61–1.24)
GFR, Estimated: 56 mL/min — ABNORMAL LOW
Glucose, Bld: 113 mg/dL — ABNORMAL HIGH (ref 70–99)
Potassium: 4.2 mmol/L (ref 3.5–5.1)
Sodium: 139 mmol/L (ref 135–145)
Total Bilirubin: 1 mg/dL (ref 0.0–1.2)
Total Protein: 6.5 g/dL (ref 6.5–8.1)

## 2024-11-29 LAB — GLUCOSE, CAPILLARY
Glucose-Capillary: 116 mg/dL — ABNORMAL HIGH (ref 70–99)
Glucose-Capillary: 141 mg/dL — ABNORMAL HIGH (ref 70–99)
Glucose-Capillary: 207 mg/dL — ABNORMAL HIGH (ref 70–99)
Glucose-Capillary: 199 mg/dL — ABNORMAL HIGH (ref 70–99)
Glucose-Capillary: 177 mg/dL — ABNORMAL HIGH (ref 70–99)

## 2024-11-29 MED ORDER — OSELTAMIVIR PHOSPHATE 75 MG PO CAPS
75.0000 mg | ORAL_CAPSULE | Freq: Two times a day (BID) | ORAL | Status: AC
  Administered 2024-11-29 – 2024-12-03 (×8): 75 mg via ORAL
  Filled 2024-11-29 (×8): qty 1

## 2024-11-29 MED ORDER — PANTOPRAZOLE SODIUM 40 MG PO TBEC
40.0000 mg | DELAYED_RELEASE_TABLET | Freq: Two times a day (BID) | ORAL | Status: DC
  Administered 2024-11-29 – 2024-12-04 (×11): 40 mg via ORAL
  Filled 2024-11-29 (×11): qty 1

## 2024-11-29 MED ORDER — ARFORMOTEROL TARTRATE 15 MCG/2ML IN NEBU
15.0000 ug | INHALATION_SOLUTION | Freq: Two times a day (BID) | RESPIRATORY_TRACT | Status: DC
  Administered 2024-11-29 – 2024-12-03 (×8): 15 ug via RESPIRATORY_TRACT
  Filled 2024-11-29 (×8): qty 2

## 2024-11-29 MED ORDER — BUDESONIDE 0.25 MG/2ML IN SUSP
0.2500 mg | Freq: Two times a day (BID) | RESPIRATORY_TRACT | Status: DC
  Administered 2024-11-29 – 2024-12-04 (×10): 0.25 mg via RESPIRATORY_TRACT
  Filled 2024-11-29 (×10): qty 2

## 2024-11-29 MED ORDER — PHENOL 1.4 % MT LIQD: 1.0000 | OROMUCOSAL | Status: DC | PRN

## 2024-11-29 MED ORDER — METOPROLOL TARTRATE 50 MG PO TABS
50.0000 mg | ORAL_TABLET | Freq: Two times a day (BID) | ORAL | Status: DC
  Administered 2024-11-29 – 2024-12-04 (×11): 50 mg via ORAL
  Filled 2024-11-29 (×11): qty 1

## 2024-11-29 NOTE — Progress Notes (Signed)
 Echocardiogram 2D Echocardiogram has been performed.  Shawn Sullivan 11/29/2024, 10:48 AM

## 2024-11-29 NOTE — [place] FL2 (Signed)
 " [place]  MEDICAID FL2 LEVEL OF CARE FORM     IDENTIFICATION  Patient Name: Shawn Sullivan Birthdate: 04/22/1946 Sex: male Admission Date (Current Location): 11/28/2024  [facility] [place] Number:  Producer, Television/film/video and Address:  The . [facility], [address], [place], [place] [address]      Provider Number: [id number]  Attending Physician Name and Address:  Trixie Nilda HERO, MD  Relative Name and Phone Number:       Current Level of Care: SNF Recommended Level of Care: Skilled Nursing Facility Prior Approval Number:    Date Approved/Denied:   PASRR Number: Pending  Discharge Plan: SNF    Current Diagnoses: Patient Active Problem List   Diagnosis Date Noted   Heart failure with recovered ejection fraction (HFrecEF) (HCC) 11/28/2024   Sepsis (HCC) 11/28/2024   Influenza A 11/28/2024   Conjunctivitis 11/28/2024   Severe episode of recurrent major depressive disorder, without psychotic features (HCC) 08/20/2024   Primary insomnia 08/20/2024   Benign prostatic hyperplasia with lower urinary tract symptoms 08/20/2024   COPD with exacerbation (HCC) 05/20/2024   Cellulitis of scrotum 02/06/2024   Chronic combined systolic and diastolic congestive heart failure (HCC) 02/06/2024   Mild protein malnutrition 02/06/2024   Hyperbilirubinemia 02/06/2024   Thrombocytopenia 02/06/2024   Retroperitoneal mass 02/06/2024   Splenomegaly 02/06/2024   Aortic atherosclerosis 02/06/2024   Seasonal allergic rhinitis due to pollen 09/12/2023   Acute recurrent frontal sinusitis 08/23/2023   Acquired thrombophilia 12/24/2021   Uncomplicated opioid dependence (HCC) 12/24/2021   SI (sacroiliac) pain 08/12/2020   Atrial fibrillation, chronic (HCC) 05/31/2020   Diabetic polyneuropathy (HCC) 02/23/2020   Gastroesophageal reflux disease without esophagitis 02/23/2020   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 failure and stage 3b chronic kidney disease (HCC) 06/14/2019   On amiodarone  therapy 03/24/2019   Hypokalemia 10/04/2018   Paroxysmal atrial fibrillation (HCC) 07/01/2018   CKD stage 3b, GFR 30-44 ml/min (HCC) 07/01/2018   COPD with acute exacerbation (HCC) 07/01/2018   Obstructive sleep apnea 07/01/2018   Class 3 severe obesity due to excess calories with serious comorbidity and body mass index (BMI) of 40.0 to 44.9 in adult (HCC) 10/04/2017   Chronic pain syndrome 08/30/2017   Restrictive lung disease 06/22/2016   Generalized edema 06/22/2016   Restless legs syndrome 03/23/2016   Presbyopia of both eyes 12/16/2015   Hyperlipidemia 12/16/2015   Bilateral pseudophakia 12/16/2015   Status post intraocular lens implant 12/16/2015   Type 2 diabetes mellitus with complication, with long-term current use of insulin  (HCC) 11/05/2015   Edema 11/04/2015   Piriformis syndrome 05/03/2014   Disorder of bursae and tendons in shoulder region 01/05/2014   Low back pain 08/17/2013   Sleep apnea 05/06/2013   Postlaminectomy syndrome, lumbar region 05/06/2013   Lumbosacral spondylosis 05/06/2013   Anxiety and depression 05/06/2013    Orientation RESPIRATION BLADDER Height & Weight     Self, Situation, Time, Place  O2 (3L nasal cannula) Continent, External catheter Weight: (!) 353 lb 6.4 oz (160.3 kg) Height:  6' 1 (185.4 cm)  BEHAVIORAL SYMPTOMS/MOOD NEUROLOGICAL BOWEL NUTRITION STATUS      Continent Diet (see d/c summary)  AMBULATORY STATUS COMMUNICATION OF NEEDS Skin   Extensive Assist Verbally PU Stage and Appropriate Care (pressure injury buttocks stage 2; wound scrotum)                       Personal Care  Assistance Level of Assistance  Bathing, Feeding, Dressing Bathing Assistance: Maximum assistance Feeding assistance: Independent Dressing Assistance: Maximum assistance     Functional Limitations Info  Sight, Hearing, Speech Sight Info: Adequate Hearing Info:  Adequate Speech Info: Adequate    SPECIAL CARE FACTORS FREQUENCY  OT (By licensed OT), PT (By licensed PT)     PT Frequency: 5x/week OT Frequency: 5x/week            Contractures Contractures Info: Not present    Additional Factors Info  Code Status, Allergies Code Status Info: full code Allergies Info: ambien (zolpidem), Androgel (testosterone), gabapentin, Duloxetine, ibuprofen, Lyrica (pregabalin), Biaxin (clarithromycin), Spironolactone           Current Medications (11/29/2024):  This is the current hospital active medication list Current Facility-Administered Medications  Medication Dose Route Frequency Provider Last Rate Last Admin   acetaminophen  (TYLENOL ) tablet 650 mg  650 mg Oral Q6H PRN Claudene Maximino LABOR, MD       Or   acetaminophen  (TYLENOL ) suppository 650 mg  650 mg Rectal Q6H PRN Smith, Rondell A, MD       albuterol  (PROVENTIL ) (2.5 MG/3ML) 0.083% nebulizer solution 2.5 mg  2.5 mg Nebulization Q2H PRN Smith, Rondell A, MD   2.5 mg at 11/29/24 9095   amiodarone  (NEXTERONE  PREMIX) 360-4.14 MG/200ML-% (1.8 mg/mL) IV infusion  30 mg/hr Intravenous Continuous Claudene Maximino A, MD 16.67 mL/hr at 11/29/24 0758 30 mg/hr at 11/29/24 0758   apixaban  (ELIQUIS ) tablet 5 mg  5 mg Oral BID Smith, Rondell A, MD   5 mg at 11/29/24 9096   arformoterol (BROVANA) nebulizer solution 15 mcg  15 mcg Nebulization BID Gherghe, Costin M, MD       artificial tears ophthalmic solution 1 drop  1 drop Right Eye PRN Claudene, Rondell A, MD       budesonide (PULMICORT) nebulizer solution 0.25 mg  0.25 mg Nebulization BID Gherghe, Costin M, MD       guaiFENesin  (MUCINEX ) 12 hr tablet 600 mg  600 mg Oral BID Smith, Rondell A, MD   600 mg at 11/29/24 0903   HYDROcodone-acetaminophen  (NORCO) 10-325 MG per tablet 1-2 tablet  1-2 tablet Oral BID PRN Smith, Rondell A, MD   1 tablet at 11/29/24 9096   insulin  aspart (novoLOG ) injection 0-5 Units  0-5 Units Subcutaneous QHS Claudene Maximino A, MD   1 Units  at 11/28/24 2306   insulin  aspart (novoLOG ) injection 0-9 Units  0-9 Units Subcutaneous TID WC Claudene Maximino A, MD   1 Units at 11/29/24 1315   insulin  glargine (LANTUS ) injection 50 Units  50 Units Subcutaneous Q2200 Claudene Maximino A, MD   50 Units at 11/28/24 2307   LORazepam  (ATIVAN ) tablet 0.5 mg  0.5 mg Oral QHS PRN Smith, Rondell A, MD       metoprolol  tartrate (LOPRESSOR ) tablet 50 mg  50 mg Oral BID Gherghe, Costin M, MD   50 mg at 11/29/24 1315   oseltamivir  (TAMIFLU ) capsule 75 mg  75 mg Oral BID Pierce, Dwayne A, RPH       pantoprazole  (PROTONIX ) EC tablet 40 mg  40 mg Oral BID Gherghe, Costin M, MD   40 mg at 11/29/24 1316   phenol (CHLORASEPTIC) mouth spray 1 spray  1 spray Mouth/Throat PRN Claudene Maximino A, MD       predniSONE  (DELTASONE ) tablet 40 mg  40 mg Oral Q breakfast Claudene Maximino A, MD   40 mg at 11/29/24 9096   rOPINIRole  (REQUIP )  tablet 1 mg  1 mg Oral QHS PRN Smith, Rondell A, MD       rosuvastatin  (CRESTOR ) tablet 40 mg  40 mg Oral QHS Smith, Rondell A, MD   40 mg at 11/28/24 2218   sertraline  (ZOLOFT ) tablet 200 mg  200 mg Oral Daily Smith, Rondell A, MD   200 mg at 11/29/24 9096   sodium chloride  flush (NS) 0.9 % injection 3 mL  3 mL Intravenous Q12H Smith, Rondell A, MD   3 mL at 11/29/24 9094   tamsulosin  (FLOMAX ) capsule 0.4 mg  0.4 mg Oral QPC supper Smith, Rondell A, MD       trimethobenzamide PHYLLISTINE) injection 200 mg  200 mg Intramuscular Q6H PRN Smith, Rondell A, MD         Discharge Medications: Please see discharge summary for a list of discharge medications.  Relevant Imaging Results:  Relevant Lab Results:   Additional Information SSN [id number] [place], LCSW     "

## 2024-11-29 NOTE — Progress Notes (Signed)
"  ° °   ° °  RE:  Shawn Sullivan  Date of Birth:  March 03, 1946  Date:  11/29/2024    To Whom It May Concern:  Please be advised that the above-named patient will require a short-term nursing home stay - anticipated 30 days or less for rehabilitation and strengthening.  The plan is for return home.   "

## 2024-11-29 NOTE — TOC Transition Note (Signed)
 Transition of Care [facility]) - Discharge Note   Patient Details  Name: Shawn Sullivan MRN: [id number] Date of Birth: 27-Mar-1946  [facility] CM/SW Contact:  Montie LOISE Louder, LCSW Phone Number: 11/29/2024, 2:19 PM   Clinical Narrative:     CSW spoke with patient's daughter- CSW introduced self and explained role.  CSW discussed with family recommendation for short term rehab at [facility]. She reports patient is in the home alone but she and her brother rotates being there w/patient. CSW explained the SNF process. She states she would like to discuss w/patient but was agreeable to sending out SNF referrals.   PASRR Pending - will upload clinicals once signed by MD TOC will provide bed offers once available  TOC will continue to follow and assist with discharge planning.    Montie Louder, MSW, LCSW Clinical Social Worker      Barriers to Discharge: Continued Medical Work up   Patient Goals and CMS Choice            Discharge Placement                       Discharge Plan and Services Additional resources added to the After Visit Summary for   In-house Referral: Clinical Social Work                                   Social Drivers of Health (SDOH) Interventions SDOH Screenings   Food Insecurity: No Food Insecurity (11/28/2024)  Housing: Low Risk (11/28/2024)  Transportation Needs: No Transportation Needs (11/28/2024)  Utilities: Not At Risk (11/28/2024)  Alcohol Screen: Low Risk (09/07/2024)  Depression (PHQ2-9): Low Risk (10/25/2024)  Recent Concern: Depression (PHQ2-9) - High Risk (09/07/2024)  Financial Resource Strain: Low Risk (09/07/2024)  Physical Activity: Inactive (09/07/2024)  Social Connections: Moderately Isolated (11/28/2024)  Stress: No Stress Concern Present (09/07/2024)  Tobacco Use: Medium Risk (11/28/2024)  Health Literacy: Adequate Health Literacy (09/07/2024)     Readmission Risk Interventions    02/08/2024    1:06  PM  Readmission Risk Prevention Plan  Transportation Screening Complete  PCP or Specialist Appt within 5-7 Days Complete  Home Care Screening Complete  Medication Review (RN CM) Complete

## 2024-11-29 NOTE — Progress Notes (Signed)
 PHARMACY NOTE:  ANTIMICROBIAL RENAL DOSAGE ADJUSTMENT  Current antimicrobial regimen includes a mismatch between antimicrobial dosage and estimated renal function.  As per policy approved by the Pharmacy & Therapeutics and Medical Executive Committees, the antimicrobial dosage will be adjusted accordingly.  Current antimicrobial dosage:  Tamiflu  30mg  PO BID  Indication: Influenza  Renal Function:  Estimated Creatinine Clearance: 72.4 mL/min (A) (by C-G formula based on SCr of 1.29 mg/dL (H)). []      On intermittent HD, scheduled: []      On CRRT    Antimicrobial dosage has been changed to:  Tamiflu  75mg  PO BID  Additional comments:Scr 1.58>>1.29 (CrCl 72ml/min)   Chaniyah Jahr A. Lyle, PharmD, BCPS, FNKF Clinical Pharmacist [place] Please utilize Amion for appropriate phone number to reach the unit pharmacist [facility])  11/29/2024 11:57 AM

## 2024-11-29 NOTE — Progress Notes (Signed)
" °   11/29/24 1955  BiPAP/CPAP/SIPAP  BiPAP/CPAP/SIPAP Pt Type Adult  Reason BIPAP/CPAP not in use Non-compliant (pt states he does not want to wear CPAP)  BiPAP/CPAP /SiPAP Vitals  Pulse Rate (!) 114  Resp 13  SpO2 94 %  Bilateral Breath Sounds Clear;Diminished    "

## 2024-11-29 NOTE — Progress Notes (Signed)
 " PROGRESS NOTE  Shawn Sullivan FMW:[id number] DOB: [birth year] DOA: 11/28/2024 PCP: Sherre Clapper, MD   LOS: 1 day   Brief Narrative / Interim history: 78 year old male with HTN, HLD, chronic diastolic CHF, DM2, COPD, CKD 3B comes into the hospital with shortness of breath and flulike illness.  Has been having URI, cough, fever and chills.  He tested positive for influenza A, and a chest x-ray showed multifocal pneumonia.  He was found to be hypoxemic requiring supplemental oxygen and was admitted to the hospital.  Subjective / 24h Interval events: He is doing better today, still complains of significant shortness of breath and wheezing but overall feels improved as compared to when he first arrived in the emergency room  Assesement and Plan: Principal problem Sepsis due to multifocal pneumonia caused by influenza A, acute hypoxic respiratory failure, COPD exacerbation-patient quite tachypneic on admission, hypoxic requiring supplemental oxygen.  Imaging showed multifocal pneumonia.  He was placed on Tamiflu , continue - Wean off to room air as tolerated - Continue DuoNebs, add Pulmicort and Brovana - Has been placed on steroids, continue  Active problems PAF, with RVR -on admission he was found to have rates into the 150s, has been placed on Cardizem  drip initially without improvement in the heart rates, and then placed on amiodarone .  Continue, along with Eliquis .  Will resume home metoprolol  and try to see if we can DC the amiodarone  soon  History of depression-continue sertraline   Underlying COPD-with exacerbation, as discussed above  DM2, with hyperglycemia-A1c 7.1, acceptable TSH group.  Continue glargine as well as sliding scale  Right eye conjunctivitis-possibly viral, Covid artificial tears  Thrombocytopenia-acute on chronic, no bleeding  CKD 3B-creatinine appears close to his baseline of around 1.5  Obesity, morbid-BMI 45.  He would benefit from weight  loss.   Scheduled Meds:  apixaban   5 mg Oral BID   guaiFENesin   600 mg Oral BID   insulin  aspart  0-5 Units Subcutaneous QHS   insulin  aspart  0-9 Units Subcutaneous TID WC   insulin  glargine  50 Units Subcutaneous Q2200   ipratropium-albuterol   3 mL Nebulization QID   oseltamivir   30 mg Oral BID   predniSONE   40 mg Oral Q breakfast   rosuvastatin   40 mg Oral QHS   sertraline   200 mg Oral Daily   sodium chloride  flush  3 mL Intravenous Q12H   tamsulosin   0.4 mg Oral QPC supper   Continuous Infusions:  amiodarone  30 mg/hr (11/29/24 0758)   PRN Meds:.acetaminophen  **OR** acetaminophen , albuterol , artificial tears, HYDROcodone-acetaminophen , LORazepam , phenol, rOPINIRole , trimethobenzamide  Current Outpatient Medications  Medication Instructions   acetaminophen  (TYLENOL ) 1,000 mg, Oral, Daily PRN   amiodarone  (PACERONE ) 200 mg, Oral, Every evening   apixaban  (ELIQUIS ) 5 mg, Oral, 2 times daily   budesonide-glycopyrrolate-formoterol  (BREZTRI  AEROSPHERE) 160-9-4.8 MCG/ACT AERO inhaler 2 puffs, Inhalation, 2 times daily   cholecalciferol (VITAMIN D3) 1,000 Units, Oral, Daily   Coenzyme Q10 (COQ-10 PO) 1 capsule, Oral, Every evening   dapagliflozin  propanediol (FARXIGA ) 10 mg, Oral, Daily before breakfast   ferrous sulfate 325 mg, Oral, Daily   HYDROcodone-acetaminophen  (NORCO) 10-325 MG tablet 1 tablet, Oral, Every 6 hours PRN   insulin  aspart (NOVOLOG  FLEXPEN) 100 UNIT/ML FlexPen Recommend increase preprandial (before meals) insulin  novolog  Check sugars before meals Take 6 U prior to breakfast, lunch, and supper. + 1 u if sugar 150-200. + 2 u if sugar between 201-250 + 3 U if sugar between 251-300 + 4 U if sugar between 301-350 +  5 U if sugar between 351-400 + 6 U if sugar > 400.   ipratropium-albuterol  (DUONEB) 0.5-2.5 (3) MG/3ML SOLN INHALE CONTENTS OF 1 VIAL VIA NEBULIZER EVERY 6 HOURS AS NEEDED FOR SHORTNESS OF BREATH   levocetirizine (XYZAL) 5 mg, Oral, Daily PRN   LORazepam   (ATIVAN ) 0.5 mg, Oral, At bedtime PRN   metolazone  (ZAROXOLYN ) 5 mg, Oral, Weekly, May take an additional 5 mg tablet for swelling if needed.    metoprolol  tartrate (LOPRESSOR ) 50 MG tablet TAKE 1 TABLET BY MOUTH EVERY MORNING, TAKE 1 TABLET IN THE EVENING, AND TAKE 1 TABLET AT BEDTIME.   montelukast  (SINGULAIR ) 10 mg, Oral, Daily at bedtime   Multiple Vitamins-Minerals (MULTIVITAMIN WITH MINERALS) tablet 1 tablet, Oral, Daily   Omeprazole 20 mg, Oral, 2 times daily   potassium chloride  SA (KLOR-CON  M) 20 MEQ tablet TAKE 2 TABLETS BY MOUTH TWICE DAILY AT BREAKFAST AND AT BEDTIME   rosuvastatin  (CRESTOR ) 40 mg, Oral, Daily at bedtime   Semaglutide  (OZEMPIC , 2 MG/DOSE, [place]) 2 mg, Subcutaneous, Weekly   sertraline  (ZOLOFT ) 200 mg, Oral, Daily   tamsulosin  (FLOMAX ) 0.4 MG CAPS capsule TAKE 1 CAPSULE BY MOUTH EVERY DAY AT BEDTIME   tiZANidine  (ZANAFLEX ) 2 mg, Oral, Daily at bedtime   torsemide  (DEMADEX ) 20 MG tablet TAKE 2 TABLETS BY MOUTH TWICE DAILY EACH MORNING AND AT NOON   Tresiba  FlexTouch 60 Units, Every evening   VENTOLIN  HFA 108 (90 Base) MCG/ACT inhaler INHALE 2 PUFFS BY MOUTH EVERY 6 HOURS AS NEEDED AS NEEDED FOR SHORTNESS OF BREATH/WHEEZING    Diet Orders (From admission, onward)     Start     Ordered   11/28/24 0740  Diet Carb Modified Room service appropriate? Yes  Diet effective now       Question Answer Comment  Diet-HS Snack? Nothing   Calorie Level Medium 1600-2000   Fluid consistency: Thin   Room service appropriate? Yes      11/28/24 0748            DVT prophylaxis:  apixaban  (ELIQUIS ) tablet 5 mg   Lab Results  Component Value Date   PLT 118 (L) 11/29/2024      Code Status: Full Code  Family Communication: Son at bedside  Status is: Inpatient Remains inpatient appropriate because: Severity of illness   Level of care: Progressive  Consultants:  None  Objective: Vitals:   11/28/24 2320 11/28/24 2350 11/29/24 0306 11/29/24 0800  BP:  125/78  130/88 (!) 141/99  Pulse:  99 (!) 103 (!) 122  Resp:   14 16  Temp: 98.2 F (36.8 C) 97.9 F (36.6 C) 98.3 F (36.8 C) 98.2 F (36.8 C)  TempSrc:  Oral Oral Oral  SpO2:  93%  96%  Weight:  (!) 160.3 kg    Height:        Intake/Output Summary (Last 24 hours) at 11/29/2024 1135 Last data filed at 11/29/2024 9096 Gross per 24 hour  Intake 498.49 ml  Output 900 ml  Net -401.51 ml   Wt Readings from Last 3 Encounters:  11/28/24 (!) 160.3 kg  10/25/24 (!) 156 kg  09/07/24 (!) 149.7 kg    Examination:  Constitutional: NAD Eyes: no scleral icterus ENMT: Mucous membranes are moist.  Neck: normal, supple Respiratory: Diffuse bilateral wheezing Cardiovascular: Irregularly irregular, tachycardic Abdomen: non distended, no tenderness. Bowel sounds positive.  Musculoskeletal: no clubbing / cyanosis.  Skin: no rashes Neurologic: non focal   Data Reviewed: I have independently reviewed following labs and  imaging studies   CBC Recent Labs  Lab 11/28/24 0537 11/29/24 0844  WBC 7.5 9.8  HGB 13.8 14.1  HCT 44.1 42.7  PLT 94* 118*  MCV 95.5 92.8  MCH 29.9 30.7  MCHC 31.3 33.0  RDW 15.9* 15.8*    Recent Labs  Lab 11/28/24 0537 11/28/24 0558 11/28/24 0815 11/28/24 0824 11/29/24 0844  NA 141  --   --   --  139  K 4.2  --   --   --  4.2  CL 101  --   --   --  101  CO2 29  --   --   --  27  GLUCOSE 146*  --   --   --  113*  BUN 22  --   --   --  26*  CREATININE 1.58*  --   --   --  1.29*  CALCIUM  9.0  --   --   --  9.2  AST  --   --   --   --  79*  ALT  --   --   --   --  56*  ALKPHOS  --   --   --   --  106  BILITOT  --   --   --   --  1.0  ALBUMIN  --   --   --   --  3.3*  MG  --   --  2.0  --   --   CRP 10.3*  --   --   --   --   PROCALCITON  --   --  0.25  --   --   LATICACIDVEN  --  2.1*  --  2.0*  --     ------------------------------------------------------------------------------------------------------------------ No results for input(s): CHOL,  HDL, LDLCALC, TRIG, CHOLHDL, LDLDIRECT in the last 72 hours.  Lab Results  Component Value Date   HGBA1C 7.1 08/15/2024   ------------------------------------------------------------------------------------------------------------------ No results for input(s): TSH, T4TOTAL, T3FREE, THYROIDAB in the last 72 hours.  Invalid input(s): FREET3  Cardiac Enzymes No results for input(s): CKMB, TROPONINI, MYOGLOBIN in the last 168 hours.  Invalid input(s): CK ------------------------------------------------------------------------------------------------------------------ No results found for: BNP  CBG: Recent Labs  Lab 11/28/24 1311 11/28/24 1328 11/28/24 1716 11/28/24 2215 11/29/24 0616  GLUCAP 230* 249* 183* 129* 116*    Recent Results (from the past 240 hours)  Blood Culture (routine x 2)     Status: Abnormal (Preliminary result)   Collection Time: 11/28/24  5:25 AM   Specimen: BLOOD  Result Value Ref Range Status   Specimen Description BLOOD SITE NOT SPECIFIED  Final   Special Requests   Final    BOTTLES DRAWN AEROBIC AND ANAEROBIC Blood Culture adequate volume   Culture  Setup Time (A)  Final    GRAM VARIABLE ROD ANAEROBIC BOTTLE ONLY CRITICAL RESULT CALLED TO, READ BACK BY AND VERIFIED WITH: PHARMD J FRANS [id number] AT 1011 BY CM Performed at [facility], [address] [place], [place] [address]    Culture GRAM VARIABLE ROD (A)  Final   Report Status PENDING  Incomplete  Blood Culture ID Panel (Reflexed)     Status: None   Collection Time: 11/28/24  5:25 AM  Result Value Ref Range Status   Enterococcus faecalis NOT DETECTED NOT DETECTED Final   Enterococcus Faecium NOT DETECTED NOT DETECTED Final   Listeria monocytogenes NOT DETECTED NOT DETECTED Final   Staphylococcus species NOT DETECTED NOT DETECTED Final  Staphylococcus aureus (BCID) NOT DETECTED NOT DETECTED Final   Staphylococcus epidermidis NOT DETECTED NOT DETECTED Final    Staphylococcus lugdunensis NOT DETECTED NOT DETECTED Final   Streptococcus species NOT DETECTED NOT DETECTED Final   Streptococcus agalactiae NOT DETECTED NOT DETECTED Final   Streptococcus pneumoniae NOT DETECTED NOT DETECTED Final   Streptococcus pyogenes NOT DETECTED NOT DETECTED Final   A.calcoaceticus-baumannii NOT DETECTED NOT DETECTED Final   Bacteroides fragilis NOT DETECTED NOT DETECTED Final   Enterobacterales NOT DETECTED NOT DETECTED Final   Enterobacter cloacae complex NOT DETECTED NOT DETECTED Final   Escherichia coli NOT DETECTED NOT DETECTED Final   Klebsiella aerogenes NOT DETECTED NOT DETECTED Final   Klebsiella oxytoca NOT DETECTED NOT DETECTED Final   Klebsiella pneumoniae NOT DETECTED NOT DETECTED Final   Proteus species NOT DETECTED NOT DETECTED Final   Salmonella species NOT DETECTED NOT DETECTED Final   Serratia marcescens NOT DETECTED NOT DETECTED Final   Haemophilus influenzae NOT DETECTED NOT DETECTED Final   Neisseria meningitidis NOT DETECTED NOT DETECTED Final   Pseudomonas aeruginosa NOT DETECTED NOT DETECTED Final   Stenotrophomonas maltophilia NOT DETECTED NOT DETECTED Final   Candida albicans NOT DETECTED NOT DETECTED Final   Candida auris NOT DETECTED NOT DETECTED Final   Candida glabrata NOT DETECTED NOT DETECTED Final   Candida krusei NOT DETECTED NOT DETECTED Final   Candida parapsilosis NOT DETECTED NOT DETECTED Final   Candida tropicalis NOT DETECTED NOT DETECTED Final   Cryptococcus neoformans/gattii NOT DETECTED NOT DETECTED Final    Comment: Performed at [facility], [address] [place], [place] [address]  Blood Culture (routine x 2)     Status: None (Preliminary result)   Collection Time: 11/28/24  5:37 AM   Specimen: BLOOD LEFT FOREARM  Result Value Ref Range Status   Specimen Description BLOOD LEFT FOREARM  Final   Special Requests   Final    BOTTLES DRAWN AEROBIC AND ANAEROBIC Blood Culture adequate volume   Culture    Final    NO GROWTH 1 DAY Performed at [facility], [address], [place], [place] [address]    Report Status PENDING  Incomplete  Resp panel by RT-PCR (RSV, Flu A&B, Covid) Anterior Nasal Swab     Status: Abnormal   Collection Time: 11/28/24  5:41 AM   Specimen: Anterior Nasal Swab  Result Value Ref Range Status   SARS Coronavirus 2 by RT PCR NEGATIVE NEGATIVE Final   Influenza A by PCR POSITIVE (A) NEGATIVE Final   Influenza B by PCR NEGATIVE NEGATIVE Final    Comment: (NOTE) The Xpert Xpress SARS-CoV-2/FLU/RSV plus assay is intended as an aid in the diagnosis of influenza from Nasopharyngeal swab specimens and should not be used as a sole basis for treatment. Nasal washings and aspirates are unacceptable for Xpert Xpress SARS-CoV-2/FLU/RSV testing.  Fact Sheet for Patients: bloggercourse.com  Fact Sheet for Healthcare Providers: seriousbroker.it  This test is not yet approved or cleared by the [place]  FDA and has been authorized for detection and/or diagnosis of SARS-CoV-2 by FDA under an Emergency Use Authorization (EUA). This EUA will remain in effect (meaning this test can be used) for the duration of the COVID-19 declaration under Section 564(b)(1) of the Act, 21 U.S.C. section 360bbb-3(b)(1), unless the authorization is terminated or revoked.     Resp Syncytial Virus by PCR NEGATIVE NEGATIVE Final    Comment: (NOTE) Fact Sheet for Patients: bloggercourse.com  Fact Sheet for Healthcare Providers: seriousbroker.it  This test is not yet approved or cleared by the [place]  FDA and has been authorized for detection and/or diagnosis of SARS-CoV-2 by FDA under an Emergency Use Authorization (EUA). This EUA will remain in effect (meaning this test can be used) for the duration of the COVID-19 declaration under Section 564(b)(1) of the Act, 21  U.S.C. section 360bbb-3(b)(1), unless the authorization is terminated or revoked.  Performed at [facility], [address] [place], [place] [address]      Radiology Studies: No results found.   Nilda Fendt, MD, PhD [facility]  Between 7 am - 7 pm I am available, please contact me via Amion (for emergencies) or Securechat (non urgent messages)  Between 7 pm - 7 am I am not available, please contact night coverage MD/APP via Amion  "

## 2024-11-30 DIAGNOSIS — J9601 Acute respiratory failure with hypoxia: Secondary | ICD-10-CM | POA: Diagnosis not present

## 2024-11-30 DIAGNOSIS — R6520 Severe sepsis without septic shock: Secondary | ICD-10-CM | POA: Diagnosis not present

## 2024-11-30 DIAGNOSIS — A419 Sepsis, unspecified organism: Secondary | ICD-10-CM | POA: Diagnosis not present

## 2024-11-30 LAB — GLUCOSE, CAPILLARY
Glucose-Capillary: 109 mg/dL — ABNORMAL HIGH (ref 70–99)
Glucose-Capillary: 122 mg/dL — ABNORMAL HIGH (ref 70–99)
Glucose-Capillary: 174 mg/dL — ABNORMAL HIGH (ref 70–99)
Glucose-Capillary: 151 mg/dL — ABNORMAL HIGH (ref 70–99)

## 2024-11-30 MED ORDER — FUROSEMIDE 10 MG/ML IJ SOLN
40.0000 mg | Freq: Once | INTRAMUSCULAR | Status: AC
  Administered 2024-11-30: 40 mg via INTRAVENOUS
  Filled 2024-11-30: qty 4

## 2024-11-30 NOTE — Progress Notes (Signed)
 " PROGRESS NOTE  Shawn Sullivan FMW:[id number] DOB: October 10, 1946 DOA: 11/28/2024 PCP: Sherre Clapper, MD   LOS: 2 days   Brief Narrative / Interim history: 79 year old male with HTN, HLD, chronic diastolic CHF, DM2, COPD, CKD 3B comes into the hospital with shortness of breath and flulike illness.  Has been having URI, cough, fever and chills.  He tested positive for influenza A, and a chest x-ray showed multifocal pneumonia.  He was found to be hypoxemic requiring supplemental oxygen and was admitted to the hospital.  Subjective / 24h Interval events: Feels subjectively better but has persistent wheezing.  No chest pain.  Was able to work with physical therapy yesterday, sat engaged in the bed and stood but could not walk due to weakness.  Assesement and Plan: Principal problem Sepsis due to multifocal pneumonia caused by influenza A, acute hypoxic respiratory failure, COPD exacerbation-patient quite tachypneic on admission, hypoxic requiring supplemental oxygen.  Imaging showed multifocal pneumonia.  He was placed on Tamiflu , continue follow total of 5 days - Wean off to room air as tolerated - Continue DuoNebs, Pulmicort, Brovana - Has been placed on steroids, continue  Active problems PAF, with RVR -on admission he was found to have rates into the 150s, has been placed on Cardizem  drip initially without improvement in the heart rates, and then placed on amiodarone .  Continue, along with Eliquis .  Will resume home metoprolol  and try to see if we can DC the amiodarone  soon  Acute on chronic diastolic CHF -he underwent a 2D echocardiogram on admission which showed LVEF 60-65%, mild LVH, RV was normal.  He has evidence of fluid overload now with lower extremity edema, will give Lasix  x 1 today and reevaluate  History of depression-continue sertraline   Underlying COPD-with exacerbation, as discussed above  DM2, with hyperglycemia-A1c 7.1, acceptable TSH group.  Continue glargine as well as  sliding scale  Right eye conjunctivitis-possibly viral, Covid artificial tears  Thrombocytopenia-acute on chronic, no bleeding  CKD 3B-creatinine appears close to his baseline of around 1.5.  Watch carefully with Lasix   Obesity, morbid-BMI 45.  He would benefit from weight loss.   Scheduled Meds:  apixaban   5 mg Oral BID   arformoterol  15 mcg Nebulization BID   budesonide (PULMICORT) nebulizer solution  0.25 mg Nebulization BID   furosemide   40 mg Intravenous Once   guaiFENesin   600 mg Oral BID   insulin  aspart  0-5 Units Subcutaneous QHS   insulin  aspart  0-9 Units Subcutaneous TID WC   insulin  glargine  50 Units Subcutaneous Q2200   metoprolol  tartrate  50 mg Oral BID   oseltamivir   75 mg Oral BID   pantoprazole   40 mg Oral BID   predniSONE   40 mg Oral Q breakfast   rosuvastatin   40 mg Oral QHS   sertraline   200 mg Oral Daily   sodium chloride  flush  3 mL Intravenous Q12H   tamsulosin   0.4 mg Oral QPC supper   Continuous Infusions:  amiodarone  30 mg/hr (11/30/24 0845)   PRN Meds:.acetaminophen  **OR** acetaminophen , albuterol , artificial tears, HYDROcodone-acetaminophen , LORazepam , phenol, rOPINIRole , trimethobenzamide  Current Outpatient Medications  Medication Instructions   acetaminophen  (TYLENOL ) 1,000 mg, Oral, Daily PRN   amiodarone  (PACERONE ) 200 mg, Oral, Every evening   apixaban  (ELIQUIS ) 5 mg, Oral, 2 times daily   budesonide-glycopyrrolate-formoterol  (BREZTRI  AEROSPHERE) 160-9-4.8 MCG/ACT AERO inhaler 2 puffs, Inhalation, 2 times daily   cholecalciferol (VITAMIN D3) 1,000 Units, Oral, Daily   Coenzyme Q10 (COQ-10 PO) 1 capsule, Oral, Every  evening   dapagliflozin  propanediol (FARXIGA ) 10 mg, Oral, Daily before breakfast   ferrous sulfate 325 mg, Oral, Daily   HYDROcodone-acetaminophen  (NORCO) 10-325 MG tablet 1 tablet, Oral, Every 6 hours PRN   insulin  aspart (NOVOLOG  FLEXPEN) 100 UNIT/ML FlexPen Recommend increase preprandial (before meals) insulin  novolog   Check sugars before meals Take 6 U prior to breakfast, lunch, and supper. + 1 u if sugar 150-200. + 2 u if sugar between 201-250 + 3 U if sugar between 251-300 + 4 U if sugar between 301-350 + 5 U if sugar between 351-400 + 6 U if sugar > 400.   ipratropium-albuterol  (DUONEB) 0.5-2.5 (3) MG/3ML SOLN INHALE CONTENTS OF 1 VIAL VIA NEBULIZER EVERY 6 HOURS AS NEEDED FOR SHORTNESS OF BREATH   levocetirizine (XYZAL) 5 mg, Oral, Daily PRN   LORazepam  (ATIVAN ) 0.5 mg, Oral, At bedtime PRN   metolazone  (ZAROXOLYN ) 5 mg, Oral, Weekly, May take an additional 5 mg tablet for swelling if needed.    metoprolol  tartrate (LOPRESSOR ) 50 MG tablet TAKE 1 TABLET BY MOUTH EVERY MORNING, TAKE 1 TABLET IN THE EVENING, AND TAKE 1 TABLET AT BEDTIME.   montelukast  (SINGULAIR ) 10 mg, Oral, Daily at bedtime   Multiple Vitamins-Minerals (MULTIVITAMIN WITH MINERALS) tablet 1 tablet, Oral, Daily   Omeprazole 20 mg, Oral, 2 times daily   potassium chloride  SA (KLOR-CON  M) 20 MEQ tablet TAKE 2 TABLETS BY MOUTH TWICE DAILY AT BREAKFAST AND AT BEDTIME   rosuvastatin  (CRESTOR ) 40 mg, Oral, Daily at bedtime   Semaglutide  (OZEMPIC , 2 MG/DOSE, Gideon) 2 mg, Subcutaneous, Weekly   sertraline  (ZOLOFT ) 200 mg, Oral, Daily   tamsulosin  (FLOMAX ) 0.4 MG CAPS capsule TAKE 1 CAPSULE BY MOUTH EVERY DAY AT BEDTIME   tiZANidine  (ZANAFLEX ) 2 mg, Oral, Daily at bedtime   torsemide  (DEMADEX ) 20 MG tablet TAKE 2 TABLETS BY MOUTH TWICE DAILY EACH MORNING AND AT NOON   Tresiba  FlexTouch 60 Units, Every evening   VENTOLIN  HFA 108 (90 Base) MCG/ACT inhaler INHALE 2 PUFFS BY MOUTH EVERY 6 HOURS AS NEEDED AS NEEDED FOR SHORTNESS OF BREATH/WHEEZING    Diet Orders (From admission, onward)     Start     Ordered   11/28/24 0740  Diet Carb Modified Room service appropriate? Yes  Diet effective now       Question Answer Comment  Diet-HS Snack? Nothing   Calorie Level Medium 1600-2000   Fluid consistency: Thin   Room service appropriate? Yes      11/28/24  0748            DVT prophylaxis:  apixaban  (ELIQUIS ) tablet 5 mg   Lab Results  Component Value Date   PLT 118 (L) 11/29/2024      Code Status: Full Code  Family Communication: Son at bedside  Status is: Inpatient Remains inpatient appropriate because: Severity of illness   Level of care: Progressive  Consultants:  None  Objective: Vitals:   11/29/24 1955 11/29/24 2243 11/30/24 0736 11/30/24 0827  BP:  118/83 120/81 120/81  Pulse: (!) 114 100 100 (!) 110  Resp: 13 14 18    Temp:  98.6 F (37 C) 99.1 F (37.3 C)   TempSrc:  Oral Oral   SpO2: 94% 95% 91%   Weight:      Height:        Intake/Output Summary (Last 24 hours) at 11/30/2024 1203 Last data filed at 11/29/2024 1730 Gross per 24 hour  Intake --  Output 100 ml  Net -100 ml  Wt Readings from Last 3 Encounters:  11/28/24 (!) 160.3 kg  10/25/24 (!) 156 kg  09/07/24 (!) 149.7 kg    Examination:  Constitutional: NAD Eyes: lids and conjunctivae normal, no scleral icterus ENMT: mmm Neck: normal, supple Respiratory: Diffuse bilateral wheezing, coarse breath sounds Cardiovascular: Regular rate and rhythm, no murmurs / rubs / gallops.  2+ pitting LE edema. Abdomen: soft, no distention, no tenderness. Bowel sounds positive.    Data Reviewed: I have independently reviewed following labs and imaging studies   CBC Recent Labs  Lab 11/28/24 0537 11/29/24 0844  WBC 7.5 9.8  HGB 13.8 14.1  HCT 44.1 42.7  PLT 94* 118*  MCV 95.5 92.8  MCH 29.9 30.7  MCHC 31.3 33.0  RDW 15.9* 15.8*    Recent Labs  Lab 11/28/24 0537 11/28/24 0558 11/28/24 0815 11/28/24 0824 11/29/24 0844  NA 141  --   --   --  139  K 4.2  --   --   --  4.2  CL 101  --   --   --  101  CO2 29  --   --   --  27  GLUCOSE 146*  --   --   --  113*  BUN 22  --   --   --  26*  CREATININE 1.58*  --   --   --  1.29*  CALCIUM  9.0  --   --   --  9.2  AST  --   --   --   --  79*  ALT  --   --   --   --  56*  ALKPHOS  --   --   --    --  106  BILITOT  --   --   --   --  1.0  ALBUMIN  --   --   --   --  3.3*  MG  --   --  2.0  --   --   CRP 10.3*  --   --   --   --   PROCALCITON  --   --  0.25  --   --   LATICACIDVEN  --  2.1*  --  2.0*  --     ------------------------------------------------------------------------------------------------------------------ No results for input(s): CHOL, HDL, LDLCALC, TRIG, CHOLHDL, LDLDIRECT in the last 72 hours.  Lab Results  Component Value Date   HGBA1C 7.1 08/15/2024   ------------------------------------------------------------------------------------------------------------------ No results for input(s): TSH, T4TOTAL, T3FREE, THYROIDAB in the last 72 hours.  Invalid input(s): FREET3  Cardiac Enzymes No results for input(s): CKMB, TROPONINI, MYOGLOBIN in the last 168 hours.  Invalid input(s): CK ------------------------------------------------------------------------------------------------------------------ No results found for: BNP  CBG: Recent Labs  Lab 11/29/24 1222 11/29/24 1637 11/29/24 1821 11/29/24 2110 11/30/24 0614  GLUCAP 141* 207* 199* 177* 109*    Recent Results (from the past 240 hours)  Blood Culture (routine x 2)     Status: Abnormal (Preliminary result)   Collection Time: 11/28/24  5:25 AM   Specimen: BLOOD  Result Value Ref Range Status   Specimen Description BLOOD SITE NOT SPECIFIED  Final   Special Requests   Final    BOTTLES DRAWN AEROBIC AND ANAEROBIC Blood Culture adequate volume   Culture  Setup Time (A)  Final    GRAM VARIABLE ROD ANAEROBIC BOTTLE ONLY CRITICAL RESULT CALLED TO, READ BACK BY AND VERIFIED WITH: PHARMD J FRANS [id number] AT 1011 BY CM Performed at [facility], [address] [place], [place] [address]    Culture GRAM VARIABLE ROD (A)  Final   Report Status PENDING  Incomplete  Blood Culture ID Panel (Reflexed)     Status: None   Collection Time: 11/28/24  5:25 AM  Result  Value Ref Range Status   Enterococcus faecalis NOT DETECTED NOT DETECTED Final   Enterococcus Faecium NOT DETECTED NOT DETECTED Final   Listeria monocytogenes NOT DETECTED NOT DETECTED Final   Staphylococcus species NOT DETECTED NOT DETECTED Final   Staphylococcus aureus (BCID) NOT DETECTED NOT DETECTED Final   Staphylococcus epidermidis NOT DETECTED NOT DETECTED Final   Staphylococcus lugdunensis NOT DETECTED NOT DETECTED Final   Streptococcus species NOT DETECTED NOT DETECTED Final   Streptococcus agalactiae NOT DETECTED NOT DETECTED Final   Streptococcus pneumoniae NOT DETECTED NOT DETECTED Final   Streptococcus pyogenes NOT DETECTED NOT DETECTED Final   A.calcoaceticus-baumannii NOT DETECTED NOT DETECTED Final   Bacteroides fragilis NOT DETECTED NOT DETECTED Final   Enterobacterales NOT DETECTED NOT DETECTED Final   Enterobacter cloacae complex NOT DETECTED NOT DETECTED Final   Escherichia coli NOT DETECTED NOT DETECTED Final   Klebsiella aerogenes NOT DETECTED NOT DETECTED Final   Klebsiella oxytoca NOT DETECTED NOT DETECTED Final   Klebsiella pneumoniae NOT DETECTED NOT DETECTED Final   Proteus species NOT DETECTED NOT DETECTED Final   Salmonella species NOT DETECTED NOT DETECTED Final   Serratia marcescens NOT DETECTED NOT DETECTED Final   Haemophilus influenzae NOT DETECTED NOT DETECTED Final   Neisseria meningitidis NOT DETECTED NOT DETECTED Final   Pseudomonas aeruginosa NOT DETECTED NOT DETECTED Final   Stenotrophomonas maltophilia NOT DETECTED NOT DETECTED Final   Candida albicans NOT DETECTED NOT DETECTED Final   Candida auris NOT DETECTED NOT DETECTED Final   Candida glabrata NOT DETECTED NOT DETECTED Final   Candida krusei NOT DETECTED NOT DETECTED Final   Candida parapsilosis NOT DETECTED NOT DETECTED Final   Candida tropicalis NOT DETECTED NOT DETECTED Final   Cryptococcus neoformans/gattii NOT DETECTED NOT DETECTED Final    Comment: Performed at [facility]  Lab, [address], [place], [place] [address]  Blood Culture (routine x 2)     Status: None (Preliminary result)   Collection Time: 11/28/24  5:37 AM   Specimen: BLOOD LEFT FOREARM  Result Value Ref Range Status   Specimen Description BLOOD LEFT FOREARM  Final   Special Requests   Final    BOTTLES DRAWN AEROBIC AND ANAEROBIC Blood Culture adequate volume   Culture   Final    NO GROWTH 2 DAYS Performed at [facility], [address], [place], [place] [address]    Report Status PENDING  Incomplete  Resp panel by RT-PCR (RSV, Flu A&B, Covid) Anterior Nasal Swab     Status: Abnormal   Collection Time: 11/28/24  5:41 AM   Specimen: Anterior Nasal Swab  Result Value Ref Range Status   SARS Coronavirus 2 by RT PCR NEGATIVE NEGATIVE Final   Influenza A by PCR POSITIVE (A) NEGATIVE Final   Influenza B by PCR NEGATIVE NEGATIVE Final    Comment: (NOTE) The Xpert Xpress SARS-CoV-2/FLU/RSV plus assay is intended as an aid in the diagnosis of influenza from Nasopharyngeal swab specimens and should not be used as a sole basis for treatment. Nasal washings and aspirates are unacceptable for Xpert Xpress SARS-CoV-2/FLU/RSV testing.  Fact Sheet for Patients: bloggercourse.com  Fact Sheet for Healthcare Providers: seriousbroker.it  This test is not yet approved or cleared by the [place]   FDA and has been authorized for detection and/or diagnosis of SARS-CoV-2 by FDA under an Emergency Use Authorization (EUA). This EUA will remain in effect (meaning this test can be used) for the duration of the COVID-19 declaration under Section 564(b)(1) of the Act, 21 U.S.C. section 360bbb-3(b)(1), unless the authorization is terminated or revoked.     Resp Syncytial Virus by PCR NEGATIVE NEGATIVE Final    Comment: (NOTE) Fact Sheet for Patients: bloggercourse.com  Fact Sheet for Healthcare  Providers: seriousbroker.it  This test is not yet approved or cleared by the [place]  FDA and has been authorized for detection and/or diagnosis of SARS-CoV-2 by FDA under an Emergency Use Authorization (EUA). This EUA will remain in effect (meaning this test can be used) for the duration of the COVID-19 declaration under Section 564(b)(1) of the Act, 21 U.S.C. section 360bbb-3(b)(1), unless the authorization is terminated or revoked.  Performed at [facility], [address], [place], [place] [address]      Radiology Studies: No results found.   Nilda Fendt, MD, PhD [facility]  Between 7 am - 7 pm I am available, please contact me via Amion (for emergencies) or Securechat (non urgent messages)  Between 7 pm - 7 am I am not available, please contact night coverage MD/APP via Amion  "

## 2024-11-30 NOTE — TOC Progression Note (Signed)
 Transition of Care [facility]) - Progression Note    Patient Details  Name: Shawn Sullivan MRN: [id number] Date of Birth: 02/18/1946  [facility]) CM/SW Contact  Montie LOISE Louder, [place] Phone Number: 11/30/2024, 11:52 AM  Clinical Narrative:     PSARR still pending     Barriers to Discharge: Continued Medical Work up               Expected Discharge Plan and Services In-house Referral: Clinical Social Work                                             Social Drivers of Health (SDOH) Interventions SDOH Screenings   Food Insecurity: No Food Insecurity (11/28/2024)  Housing: Low Risk (11/28/2024)  Transportation Needs: No Transportation Needs (11/28/2024)  Utilities: Not At Risk (11/28/2024)  Alcohol Screen: Low Risk (09/07/2024)  Depression (PHQ2-9): Low Risk (10/25/2024)  Recent Concern: Depression (PHQ2-9) - High Risk (09/07/2024)  Financial Resource Strain: Low Risk (09/07/2024)  Physical Activity: Inactive (09/07/2024)  Social Connections: Moderately Isolated (11/28/2024)  Stress: No Stress Concern Present (09/07/2024)  Tobacco Use: Medium Risk (11/28/2024)  Health Literacy: Adequate Health Literacy (09/07/2024)    Readmission Risk Interventions    02/08/2024    1:06 PM  Readmission Risk Prevention Plan  Transportation Screening Complete  PCP or Specialist Appt within 5-7 Days Complete  Home Care Screening Complete  Medication Review (RN CM) Complete

## 2024-12-01 DIAGNOSIS — R6520 Severe sepsis without septic shock: Secondary | ICD-10-CM | POA: Diagnosis not present

## 2024-12-01 DIAGNOSIS — A419 Sepsis, unspecified organism: Secondary | ICD-10-CM | POA: Diagnosis not present

## 2024-12-01 DIAGNOSIS — J9601 Acute respiratory failure with hypoxia: Secondary | ICD-10-CM | POA: Diagnosis not present

## 2024-12-01 LAB — CBC
HCT: 44.1 % (ref 39.0–52.0)
Hemoglobin: 13.6 g/dL (ref 13.0–17.0)
MCH: 29.7 pg (ref 26.0–34.0)
MCHC: 30.8 g/dL (ref 30.0–36.0)
MCV: 96.3 fL (ref 80.0–100.0)
Platelets: 121 K/uL — ABNORMAL LOW (ref 150–400)
RBC: 4.58 MIL/uL (ref 4.22–5.81)
RDW: 15.6 % — ABNORMAL HIGH (ref 11.5–15.5)
WBC: 8.4 K/uL (ref 4.0–10.5)
nRBC: 0 % (ref 0.0–0.2)

## 2024-12-01 LAB — GLUCOSE, CAPILLARY
Glucose-Capillary: 108 mg/dL — ABNORMAL HIGH (ref 70–99)
Glucose-Capillary: 116 mg/dL — ABNORMAL HIGH (ref 70–99)
Glucose-Capillary: 174 mg/dL — ABNORMAL HIGH (ref 70–99)

## 2024-12-01 LAB — MAGNESIUM: Magnesium: 2.1 mg/dL (ref 1.7–2.4)

## 2024-12-01 LAB — BASIC METABOLIC PANEL WITH GFR
Anion gap: 10 (ref 5–15)
BUN: 23 mg/dL (ref 8–23)
CO2: 26 mmol/L (ref 22–32)
Calcium: 9.2 mg/dL (ref 8.9–10.3)
Chloride: 101 mmol/L (ref 98–111)
Creatinine, Ser: 1.04 mg/dL (ref 0.61–1.24)
GFR, Estimated: >60 mL/min
Glucose, Bld: 132 mg/dL — ABNORMAL HIGH (ref 70–99)
Potassium: 4.2 mmol/L (ref 3.5–5.1)
Sodium: 136 mmol/L (ref 135–145)

## 2024-12-01 MED ORDER — METOLAZONE 5 MG PO TABS
5.0000 mg | ORAL_TABLET | Freq: Once | ORAL | Status: AC
  Administered 2024-12-01: 5 mg via ORAL
  Filled 2024-12-01: qty 1

## 2024-12-01 MED ORDER — FUROSEMIDE 10 MG/ML IJ SOLN
40.0000 mg | Freq: Two times a day (BID) | INTRAMUSCULAR | Status: DC
  Administered 2024-12-01 – 2024-12-04 (×7): 40 mg via INTRAVENOUS
  Filled 2024-12-01 (×7): qty 4

## 2024-12-01 NOTE — Progress Notes (Signed)
 Physical Therapy Treatment Patient Details Name: Shawn Sullivan MRN: [id number] DOB: [birth year] Today's Date: 12/01/2024   History of Present Illness Pt is 79 yo presenting to [facility] on 12/30 due to shortness of breath. Admitted with sepsis 2/2 PNA caused by FluA, acute hypoxic respiratory failure, COPD exacerbation. Also with PAF with RVR.  PMH: CHF, A-fib, COPD   PT Comments  Pt received on BSC and agreeable to PT session. Pt continues to be limited by fatigue and decreased activity tolerance. TotalA needed to don socks and for posterior pericare in standing. Able to perform two stands with MinA and use of RW. Pt initially performed step-pivot transfer to the EOB with CGA and 1UE. After resting, pt was then able to ambulate 69ft to transfer to the recliner. Rest breaks in between seated LE exercises due to feeling SOB with vitals below. Continue to recommend <3hrs post acute rehab with acute PT to follow.   93% SpO2 on 7L   If plan is discharge home, recommend the following: Help with stairs or ramp for entrance;Assist for transportation;Assistance with cooking/housework   Can travel by private vehicle     Yes  Equipment Recommendations  Hospital bed       Precautions / Restrictions Precautions Precautions: Fall Recall of Precautions/Restrictions: Intact Precaution/Restrictions Comments: watch HR Restrictions Weight Bearing Restrictions Per Provider Order: No     Mobility  Bed Mobility    General bed mobility comments: received on BSC, returned to recliner    Transfers Overall transfer level: Needs assistance Equipment used: Rolling walker (2 wheels), 1 person hand held assist Transfers: Sit to/from Stand, Bed to chair/wheelchair/BSC Sit to Stand: Min assist   Step pivot transfers: Contact guard assist    General transfer comment: MinA needed to boost-up with cues for hand placement. Performed step-pivot from [facility] to EOB with 1HH. Then used RW to transfer from EOB to  recliner    Ambulation/Gait Ambulation/Gait assistance: Contact guard assist Gait Distance (Feet): 6 Feet Assistive device: Rolling walker (2 wheels) Gait Pattern/deviations: Step-through pattern, Decreased stride length, Shuffle Gait velocity: decr    General Gait Details: slow and shuffling steps with use of RW to recliner. Distance limited by fatigue and feeling SOB     Balance Overall balance assessment: Needs assistance Sitting-balance support: No upper extremity supported, Feet supported Sitting balance-Leahy Scale: Fair    Standing balance support: Bilateral upper extremity supported, During functional activity, Reliant on assistive device for balance Standing balance-Leahy Scale: Poor Standing balance comment: reliant on UE and external support     Communication Communication Communication: No apparent difficulties  Cognition Arousal: Alert Behavior During Therapy: WFL for tasks assessed/performed   PT - Cognitive impairments: No apparent impairments    Following commands: Intact      Cueing Cueing Techniques: Verbal cues, Tactile cues  Exercises General Exercises - Lower Extremity Ankle Circles/Pumps: AROM, Both, Supine, 10 reps Long Arc Quad: AROM, Both, 10 reps, Seated Hip Flexion/Marching: AROM, Both, 10 reps, Seated        Pertinent Vitals/Pain Pain Assessment Pain Assessment: Faces Faces Pain Scale: Hurts little more Pain Location: low back Pain Descriptors / Indicators: Aching, Discomfort Pain Intervention(s): Limited activity within patient's tolerance, Monitored during session, Repositioned     PT Goals (current goals can now be found in the care plan section) Acute Rehab PT Goals PT Goal Formulation: With patient Time For Goal Achievement: 12/12/24 Potential to Achieve Goals: Good Progress towards PT goals: Progressing toward goals    Frequency  Min 1X/week       AM-PAC PT 6 Clicks Mobility   Outcome Measure  Help needed  turning from your back to your side while in a flat bed without using bedrails?: A Lot Help needed moving from lying on your back to sitting on the side of a flat bed without using bedrails?: A Lot Help needed moving to and from a bed to a chair (including a wheelchair)?: A Little Help needed standing up from a chair using your arms (e.g., wheelchair or bedside chair)?: A Little Help needed to walk in hospital room?: A Lot Help needed climbing 3-5 steps with a railing? : Total 6 Click Score: 13    End of Session Equipment Utilized During Treatment: Gait belt;Oxygen Activity Tolerance: Patient tolerated treatment well Patient left: in chair;with call bell/phone within reach;with chair alarm set Nurse Communication: Mobility status PT Visit Diagnosis: Unsteadiness on feet (R26.81);Other abnormalities of gait and mobility (R26.89);Repeated falls (R29.6);Muscle weakness (generalized) (M62.81)     Time: 9095-9077 PT Time Calculation (min) (ACUTE ONLY): 18 min  Charges:    $Therapeutic Exercise: 8-22 mins PT General Charges $$ ACUTE PT VISIT: 1 Visit                    Kate ORN, PT, DPT Secure Chat Preferred  Rehab Office [phone]  Kate BRAVO Wendolyn 12/01/2024, 10:31 AM

## 2024-12-01 NOTE — Progress Notes (Signed)
 Sp02 fluctuating from 90s to 60s, pt repositioned with no change, respiratory therapy notified for assistance, pt placed on high flow oxygen at 8 LPM via [place].  Pt currently maintaining in the high 90s.

## 2024-12-01 NOTE — TOC Progression Note (Signed)
 Transition of Care [facility]) - Progression Note    Patient Details  Name: Shawn Sullivan MRN: [id number] Date of Birth: December 09, 1945  Transition [facility]) CM/SW Contact  Shawn Sullivan, [place] Phone Number: 12/01/2024, 11:16 AM  Clinical Narrative:     CSW met with patient, introduced self and explained role. CSW discussed with patient recommendation for short term rehab. Patient states he is agreeable top rehab but wants a rehab close to his home. CSW informed patient of potential barriers (bariatric needs  ) CSW explained the SNF process.  TOC will provide bed offers once available   Shawn Sullivan, MSW, LCSW Clinical Social Worker      Barriers to Discharge: Continued Medical Work up               Expected Discharge Plan and Services In-house Referral: Clinical Social Work                                             Social Drivers of Health (SDOH) Interventions SDOH Screenings   Food Insecurity: No Food Insecurity (11/28/2024)  Housing: Low Risk (11/28/2024)  Transportation Needs: No Transportation Needs (11/28/2024)  Utilities: Not At Risk (11/28/2024)  Alcohol Screen: Low Risk (09/07/2024)  Depression (PHQ2-9): Low Risk (10/25/2024)  Recent Concern: Depression (PHQ2-9) - High Risk (09/07/2024)  Financial Resource Strain: Low Risk (09/07/2024)  Physical Activity: Inactive (09/07/2024)  Social Connections: Moderately Isolated (11/28/2024)  Stress: No Stress Concern Present (09/07/2024)  Tobacco Use: Medium Risk (11/28/2024)  Health Literacy: Adequate Health Literacy (09/07/2024)    Readmission Risk Interventions    02/08/2024    1:06 PM  Readmission Risk Prevention Plan  Transportation Screening Complete  PCP or Specialist Appt within 5-7 Days Complete  Home Care Screening Complete  Medication Review (RN CM) Complete

## 2024-12-01 NOTE — Progress Notes (Signed)
 " PROGRESS NOTE  Shawn Sullivan FMW:[id number] DOB: 27-Nov-1946 DOA: 11/28/2024 PCP: Sherre Clapper, MD   LOS: 3 days   Brief Narrative / Interim history: 79 year old male with HTN, HLD, chronic diastolic CHF, DM2, COPD, CKD 3B comes into the hospital with shortness of breath and flulike illness.  Has been having URI, cough, fever and chills.  He tested positive for influenza A, and a chest x-ray showed multifocal pneumonia.  He was found to be hypoxemic requiring supplemental oxygen and was admitted to the hospital.  Subjective / 24h Interval events: He is sitting in the chair, appreciates improvement.  He denies any fever or chills  Assesement and Plan: Principal problem Sepsis due to multifocal pneumonia caused by influenza A, acute hypoxic respiratory failure, COPD exacerbation-patient quite tachypneic on admission, hypoxic requiring supplemental oxygen.  Imaging showed multifocal pneumonia.  He was placed on Tamiflu , continue follow total of 5 days - Wean off to room air as tolerated, still remains hypoxic and required up to 7 L when working with PT - Continue DuoNebs, Pulmicort, Brovana - Has been placed on steroids, continue - Diuresing as below  Active problems PAF, with RVR -on admission he was found to have rates into the 150s, has been placed on Cardizem  drip initially without improvement in the heart rates, and then placed on amiodarone .  Continue, along with Eliquis .  Metoprolol  has been resumed, will attempt to discontinue amiodarone   Acute on chronic diastolic CHF -he underwent a 2D echocardiogram on admission which showed LVEF 60-65%, mild LVH, RV was normal.  He has evidence of fluid overload now with lower extremity edema, received Lasix  yesterday, continue today scheduled as his renal function is stable  History of depression-continue sertraline   Underlying COPD-with exacerbation, as discussed above  DM2, with hyperglycemia-A1c 7.1, acceptable TSH group.  Continue  glargine as well as sliding scale  Right eye conjunctivitis-possibly viral, artificial tears  Thrombocytopenia-acute on chronic, no bleeding  CKD 3B-baseline creatinine around 1.5, currently better than baseline 1.0.  Continue Lasix   Obesity, morbid-BMI 45.  He would benefit from weight loss.   Scheduled Meds:  apixaban   5 mg Oral BID   arformoterol  15 mcg Nebulization BID   budesonide (PULMICORT) nebulizer solution  0.25 mg Nebulization BID   furosemide   40 mg Intravenous Q12H   guaiFENesin   600 mg Oral BID   insulin  aspart  0-5 Units Subcutaneous QHS   insulin  aspart  0-9 Units Subcutaneous TID WC   insulin  glargine  50 Units Subcutaneous Q2200   metoprolol  tartrate  50 mg Oral BID   oseltamivir   75 mg Oral BID   pantoprazole   40 mg Oral BID   predniSONE   40 mg Oral Q breakfast   rosuvastatin   40 mg Oral QHS   sertraline   200 mg Oral Daily   sodium chloride  flush  3 mL Intravenous Q12H   tamsulosin   0.4 mg Oral QPC supper   Continuous Infusions:   PRN Meds:.acetaminophen  **OR** acetaminophen , albuterol , artificial tears, HYDROcodone-acetaminophen , LORazepam , phenol, rOPINIRole , trimethobenzamide  Current Outpatient Medications  Medication Instructions   acetaminophen  (TYLENOL ) 1,000 mg, Oral, Daily PRN   amiodarone  (PACERONE ) 200 mg, Oral, Every evening   apixaban  (ELIQUIS ) 5 mg, Oral, 2 times daily   budesonide-glycopyrrolate-formoterol  (BREZTRI  AEROSPHERE) 160-9-4.8 MCG/ACT AERO inhaler 2 puffs, Inhalation, 2 times daily   cholecalciferol (VITAMIN D3) 1,000 Units, Oral, Daily   Coenzyme Q10 (COQ-10 PO) 1 capsule, Oral, Every evening   dapagliflozin  propanediol (FARXIGA ) 10 mg, Oral, Daily before breakfast  ferrous sulfate 325 mg, Oral, Daily   HYDROcodone-acetaminophen  (NORCO) 10-325 MG tablet 1 tablet, Oral, Every 6 hours PRN   insulin  aspart (NOVOLOG  FLEXPEN) 100 UNIT/ML FlexPen Recommend increase preprandial (before meals) insulin  novolog  Check sugars before  meals Take 6 U prior to breakfast, lunch, and supper. + 1 u if sugar 150-200. + 2 u if sugar between 201-250 + 3 U if sugar between 251-300 + 4 U if sugar between 301-350 + 5 U if sugar between 351-400 + 6 U if sugar > 400.   ipratropium-albuterol  (DUONEB) 0.5-2.5 (3) MG/3ML SOLN INHALE CONTENTS OF 1 VIAL VIA NEBULIZER EVERY 6 HOURS AS NEEDED FOR SHORTNESS OF BREATH   levocetirizine (XYZAL) 5 mg, Oral, Daily PRN   LORazepam  (ATIVAN ) 0.5 mg, Oral, At bedtime PRN   metolazone  (ZAROXOLYN ) 5 mg, Oral, Weekly, May take an additional 5 mg tablet for swelling if needed.    metoprolol  tartrate (LOPRESSOR ) 50 MG tablet TAKE 1 TABLET BY MOUTH EVERY MORNING, TAKE 1 TABLET IN THE EVENING, AND TAKE 1 TABLET AT BEDTIME.   montelukast  (SINGULAIR ) 10 mg, Oral, Daily at bedtime   Multiple Vitamins-Minerals (MULTIVITAMIN WITH MINERALS) tablet 1 tablet, Oral, Daily   Omeprazole 20 mg, Oral, 2 times daily   potassium chloride  SA (KLOR-CON  M) 20 MEQ tablet TAKE 2 TABLETS BY MOUTH TWICE DAILY AT BREAKFAST AND AT BEDTIME   rosuvastatin  (CRESTOR ) 40 mg, Oral, Daily at bedtime   Semaglutide  (OZEMPIC , 2 MG/DOSE, [place]) 2 mg, Subcutaneous, Weekly   sertraline  (ZOLOFT ) 200 mg, Oral, Daily   tamsulosin  (FLOMAX ) 0.4 MG CAPS capsule TAKE 1 CAPSULE BY MOUTH EVERY DAY AT BEDTIME   tiZANidine  (ZANAFLEX ) 2 mg, Oral, Daily at bedtime   torsemide  (DEMADEX ) 20 MG tablet TAKE 2 TABLETS BY MOUTH TWICE DAILY EACH MORNING AND AT NOON   Tresiba  FlexTouch 60 Units, Every evening   VENTOLIN  HFA 108 (90 Base) MCG/ACT inhaler INHALE 2 PUFFS BY MOUTH EVERY 6 HOURS AS NEEDED AS NEEDED FOR SHORTNESS OF BREATH/WHEEZING    Diet Orders (From admission, onward)     Start     Ordered   11/28/24 0740  Diet Carb Modified Room service appropriate? Yes  Diet effective now       Question Answer Comment  Diet-HS Snack? Nothing   Calorie Level Medium 1600-2000   Fluid consistency: Thin   Room service appropriate? Yes      11/28/24 0748             DVT prophylaxis:  apixaban  (ELIQUIS ) tablet 5 mg   Lab Results  Component Value Date   PLT 121 (L) 12/01/2024      Code Status: Full Code  Family Communication: Son at bedside  Status is: Inpatient Remains inpatient appropriate because: Severity of illness   Level of care: Progressive  Consultants:  None  Objective: Vitals:   12/01/24 0315 12/01/24 0451 12/01/24 0802 12/01/24 0835  BP: 124/80  137/77 133/77  Pulse: 90  79 97  Resp: 18  15   Temp: 97.8 F (36.6 C)  97.8 F (36.6 C)   TempSrc: Oral  Oral   SpO2: 96%  100%   Weight:  (!) 158.4 kg    Height:        Intake/Output Summary (Last 24 hours) at 12/01/2024 1101 Last data filed at 12/01/2024 0804 Gross per 24 hour  Intake 243 ml  Output 1050 ml  Net -807 ml   Wt Readings from Last 3 Encounters:  12/01/24 (!) 158.4 kg  10/25/24 ROLLEN)  156 kg  09/07/24 (!) 149.7 kg    Examination:  Constitutional: NAD Eyes: lids and conjunctivae normal, no scleral icterus ENMT: mmm Neck: normal, supple Respiratory: clear to auscultation bilaterally, no wheezing, no crackles. Normal respiratory effort.  Cardiovascular: Regular rate and rhythm, no murmurs / rubs / gallops.  2+ pitting LE edema. Abdomen: soft, no distention, no tenderness. Bowel sounds positive.    Data Reviewed: I have independently reviewed following labs and imaging studies   CBC Recent Labs  Lab 11/28/24 0537 11/29/24 0844 12/01/24 0253  WBC 7.5 9.8 8.4  HGB 13.8 14.1 13.6  HCT 44.1 42.7 44.1  PLT 94* 118* 121*  MCV 95.5 92.8 96.3  MCH 29.9 30.7 29.7  MCHC 31.3 33.0 30.8  RDW 15.9* 15.8* 15.6*    Recent Labs  Lab 11/28/24 0537 11/28/24 0558 11/28/24 0815 11/28/24 0824 11/29/24 0844 12/01/24 0253  NA 141  --   --   --  139 136  K 4.2  --   --   --  4.2 4.2  CL 101  --   --   --  101 101  CO2 29  --   --   --  27 26  GLUCOSE 146*  --   --   --  113* 132*  BUN 22  --   --   --  26* 23  CREATININE 1.58*  --   --   --  1.29*  1.04  CALCIUM  9.0  --   --   --  9.2 9.2  AST  --   --   --   --  79*  --   ALT  --   --   --   --  56*  --   ALKPHOS  --   --   --   --  106  --   BILITOT  --   --   --   --  1.0  --   ALBUMIN  --   --   --   --  3.3*  --   MG  --   --  2.0  --   --  2.1  CRP 10.3*  --   --   --   --   --   PROCALCITON  --   --  0.25  --   --   --   LATICACIDVEN  --  2.1*  --  2.0*  --   --     ------------------------------------------------------------------------------------------------------------------ No results for input(s): CHOL, HDL, LDLCALC, TRIG, CHOLHDL, LDLDIRECT in the last 72 hours.  Lab Results  Component Value Date   HGBA1C 7.1 08/15/2024   ------------------------------------------------------------------------------------------------------------------ No results for input(s): TSH, T4TOTAL, T3FREE, THYROIDAB in the last 72 hours.  Invalid input(s): FREET3  Cardiac Enzymes No results for input(s): CKMB, TROPONINI, MYOGLOBIN in the last 168 hours.  Invalid input(s): CK ------------------------------------------------------------------------------------------------------------------ No results found for: BNP  CBG: Recent Labs  Lab 11/30/24 0614 11/30/24 1208 11/30/24 1627 11/30/24 2117 12/01/24 0610  GLUCAP 109* 122* 174* 151* 108*    Recent Results (from the past 240 hours)  Blood Culture (routine x 2)     Status: Abnormal (Preliminary result)   Collection Time: 11/28/24  5:25 AM   Specimen: BLOOD  Result Value Ref Range Status   Specimen Description BLOOD SITE NOT SPECIFIED  Final   Special Requests   Final    BOTTLES DRAWN AEROBIC AND ANAEROBIC Blood Culture adequate volume   Culture  Setup Time (A)  Final    GRAM VARIABLE ROD ANAEROBIC BOTTLE ONLY CRITICAL RESULT CALLED TO, READ BACK BY AND VERIFIED WITH: PHARMD J FRANS [id number] AT 1011 BY CM Performed at [facility], [address], [place], [place] [address]    Culture  GRAM VARIABLE ROD (A)  Final   Report Status PENDING  Incomplete  Blood Culture ID Panel (Reflexed)     Status: None   Collection Time: 11/28/24  5:25 AM  Result Value Ref Range Status   Enterococcus faecalis NOT DETECTED NOT DETECTED Final   Enterococcus Faecium NOT DETECTED NOT DETECTED Final   Listeria monocytogenes NOT DETECTED NOT DETECTED Final   Staphylococcus species NOT DETECTED NOT DETECTED Final   Staphylococcus aureus (BCID) NOT DETECTED NOT DETECTED Final   Staphylococcus epidermidis NOT DETECTED NOT DETECTED Final   Staphylococcus lugdunensis NOT DETECTED NOT DETECTED Final   Streptococcus species NOT DETECTED NOT DETECTED Final   Streptococcus agalactiae NOT DETECTED NOT DETECTED Final   Streptococcus pneumoniae NOT DETECTED NOT DETECTED Final   Streptococcus pyogenes NOT DETECTED NOT DETECTED Final   A.calcoaceticus-baumannii NOT DETECTED NOT DETECTED Final   Bacteroides fragilis NOT DETECTED NOT DETECTED Final   Enterobacterales NOT DETECTED NOT DETECTED Final   Enterobacter cloacae complex NOT DETECTED NOT DETECTED Final   Escherichia coli NOT DETECTED NOT DETECTED Final   Klebsiella aerogenes NOT DETECTED NOT DETECTED Final   Klebsiella oxytoca NOT DETECTED NOT DETECTED Final   Klebsiella pneumoniae NOT DETECTED NOT DETECTED Final   Proteus species NOT DETECTED NOT DETECTED Final   Salmonella species NOT DETECTED NOT DETECTED Final   Serratia marcescens NOT DETECTED NOT DETECTED Final   Haemophilus influenzae NOT DETECTED NOT DETECTED Final   Neisseria meningitidis NOT DETECTED NOT DETECTED Final   Pseudomonas aeruginosa NOT DETECTED NOT DETECTED Final   Stenotrophomonas maltophilia NOT DETECTED NOT DETECTED Final   Candida albicans NOT DETECTED NOT DETECTED Final   Candida auris NOT DETECTED NOT DETECTED Final   Candida glabrata NOT DETECTED NOT DETECTED Final   Candida krusei NOT DETECTED NOT DETECTED Final   Candida parapsilosis NOT DETECTED NOT DETECTED Final    Candida tropicalis NOT DETECTED NOT DETECTED Final   Cryptococcus neoformans/gattii NOT DETECTED NOT DETECTED Final    Comment: Performed at [facility], [address] [place], [place] [address]  Blood Culture (routine x 2)     Status: None (Preliminary result)   Collection Time: 11/28/24  5:37 AM   Specimen: BLOOD LEFT FOREARM  Result Value Ref Range Status   Specimen Description BLOOD LEFT FOREARM  Final   Special Requests   Final    BOTTLES DRAWN AEROBIC AND ANAEROBIC Blood Culture adequate volume   Culture   Final    NO GROWTH 3 DAYS Performed at [facility], [address] [place], [place] [address]    Report Status PENDING  Incomplete  Resp panel by RT-PCR (RSV, Flu A&B, Covid) Anterior Nasal Swab     Status: Abnormal   Collection Time: 11/28/24  5:41 AM   Specimen: Anterior Nasal Swab  Result Value Ref Range Status   SARS Coronavirus 2 by RT PCR NEGATIVE NEGATIVE Final   Influenza A by PCR POSITIVE (A) NEGATIVE Final   Influenza B by PCR NEGATIVE NEGATIVE Final    Comment: (NOTE) The Xpert Xpress SARS-CoV-2/FLU/RSV plus assay is intended as an aid in the diagnosis of influenza from Nasopharyngeal swab specimens and should not be used as a sole basis for treatment.  Nasal washings and aspirates are unacceptable for Xpert Xpress SARS-CoV-2/FLU/RSV testing.  Fact Sheet for Patients: bloggercourse.com  Fact Sheet for Healthcare Providers: seriousbroker.it  This test is not yet approved or cleared by the [place]  FDA and has been authorized for detection and/or diagnosis of SARS-CoV-2 by FDA under an Emergency Use Authorization (EUA). This EUA will remain in effect (meaning this test can be used) for the duration of the COVID-19 declaration under Section 564(b)(1) of the Act, 21 U.S.C. section 360bbb-3(b)(1), unless the authorization is terminated or revoked.     Resp Syncytial Virus by PCR NEGATIVE  NEGATIVE Final    Comment: (NOTE) Fact Sheet for Patients: bloggercourse.com  Fact Sheet for Healthcare Providers: seriousbroker.it  This test is not yet approved or cleared by the [place]  FDA and has been authorized for detection and/or diagnosis of SARS-CoV-2 by FDA under an Emergency Use Authorization (EUA). This EUA will remain in effect (meaning this test can be used) for the duration of the COVID-19 declaration under Section 564(b)(1) of the Act, 21 U.S.C. section 360bbb-3(b)(1), unless the authorization is terminated or revoked.  Performed at [facility], [address], [place], [place] [address]      Radiology Studies: No results found.   Nilda Fendt, MD, PhD [facility]  Between 7 am - 7 pm I am available, please contact me via Amion (for emergencies) or Securechat (non urgent messages)  Between 7 pm - 7 am I am not available, please contact night coverage MD/APP via Amion  "

## 2024-12-02 ENCOUNTER — Inpatient Hospital Stay (HOSPITAL_COMMUNITY)

## 2024-12-02 DIAGNOSIS — J9601 Acute respiratory failure with hypoxia: Secondary | ICD-10-CM | POA: Diagnosis not present

## 2024-12-02 DIAGNOSIS — A419 Sepsis, unspecified organism: Secondary | ICD-10-CM | POA: Diagnosis not present

## 2024-12-02 DIAGNOSIS — R6520 Severe sepsis without septic shock: Secondary | ICD-10-CM | POA: Diagnosis not present

## 2024-12-02 LAB — COMPREHENSIVE METABOLIC PANEL WITH GFR
ALT: 45 U/L — ABNORMAL HIGH (ref 0–44)
AST: 46 U/L — ABNORMAL HIGH (ref 15–41)
Albumin: 3 g/dL — ABNORMAL LOW (ref 3.5–5.0)
Alkaline Phosphatase: 99 U/L (ref 38–126)
Anion gap: 8 (ref 5–15)
BUN: 24 mg/dL — ABNORMAL HIGH (ref 8–23)
CO2: 36 mmol/L — ABNORMAL HIGH (ref 22–32)
Calcium: 9.9 mg/dL (ref 8.9–10.3)
Chloride: 95 mmol/L — ABNORMAL LOW (ref 98–111)
Creatinine, Ser: 1.1 mg/dL (ref 0.61–1.24)
GFR, Estimated: >60 mL/min
Glucose, Bld: 101 mg/dL — ABNORMAL HIGH (ref 70–99)
Potassium: 3.6 mmol/L (ref 3.5–5.1)
Sodium: 139 mmol/L (ref 135–145)
Total Bilirubin: 1.4 mg/dL — ABNORMAL HIGH (ref 0.0–1.2)
Total Protein: 6 g/dL — ABNORMAL LOW (ref 6.5–8.1)

## 2024-12-02 LAB — CBC
HCT: 43.1 % (ref 39.0–52.0)
Hemoglobin: 13.7 g/dL (ref 13.0–17.0)
MCH: 29.8 pg (ref 26.0–34.0)
MCHC: 31.8 g/dL (ref 30.0–36.0)
MCV: 93.7 fL (ref 80.0–100.0)
Platelets: 139 K/uL — ABNORMAL LOW (ref 150–400)
RBC: 4.6 MIL/uL (ref 4.22–5.81)
RDW: 14.9 % (ref 11.5–15.5)
WBC: 7.3 K/uL (ref 4.0–10.5)
nRBC: 0 % (ref 0.0–0.2)

## 2024-12-02 LAB — GLUCOSE, CAPILLARY
Glucose-Capillary: 143 mg/dL — ABNORMAL HIGH (ref 70–99)
Glucose-Capillary: 98 mg/dL (ref 70–99)
Glucose-Capillary: 81 mg/dL (ref 70–99)
Glucose-Capillary: 126 mg/dL — ABNORMAL HIGH (ref 70–99)
Glucose-Capillary: 192 mg/dL — ABNORMAL HIGH (ref 70–99)
Glucose-Capillary: 204 mg/dL — ABNORMAL HIGH (ref 70–99)

## 2024-12-02 LAB — MAGNESIUM: Magnesium: 1.9 mg/dL (ref 1.7–2.4)

## 2024-12-02 NOTE — Progress Notes (Signed)
 Mobility Specialist Progress Note;   12/02/24 1114  Mobility  Activity Ambulated with assistance (in room)  Level of Assistance Contact guard assist, steadying assist  Assistive Device Front wheel walker  Distance Ambulated (ft) 20 ft  Activity Response Tolerated well  Mobility Referral Yes  Mobility visit 1 Mobility  Mobility Specialist Start Time (ACUTE ONLY) 1114  Mobility Specialist Stop Time (ACUTE ONLY) 1126  Mobility Specialist Time Calculation (min) (ACUTE ONLY) 12 min   Pt agreeable to mobility. Required MinG assistance w/ bed mobility, pt reliant on bed features, and MinG assistance to ambulate 2 bouts of short distances in room. VSS on 2LO2 and no c/o SOB when asked. Pt deferred sitting in chair, however requested to stay sitting on EoB. Pt left with all needs met, alarm on. Son present.   Lauraine Erm Mobility Specialist Please contact via SecureChat or [employer] [phone]

## 2024-12-02 NOTE — Progress Notes (Signed)
 " PROGRESS NOTE  Shawn Sullivan FMW:[id number] DOB: 1946/04/25 DOA: 11/28/2024 PCP: Sherre Clapper, MD   LOS: 4 days   Brief Narrative / Interim history: 79 year old male with HTN, HLD, chronic diastolic CHF, DM2, COPD, CKD 3B comes into the hospital with shortness of breath and flulike illness.  Has been having URI, cough, fever and chills.  He tested positive for influenza A, and a chest x-ray showed multifocal pneumonia.  He was found to be hypoxemic requiring supplemental oxygen and was admitted to the hospital.  Subjective / 24h Interval events: Sitting in the chair, son is at bedside.  He denies any chest pain.  He appreciated his breathing is improved.  Assesement and Plan: Principal problem Sepsis due to multifocal pneumonia caused by influenza A, acute hypoxic respiratory failure, COPD exacerbation-patient quite tachypneic on admission, hypoxic requiring supplemental oxygen.  Imaging showed multifocal pneumonia.  He was placed on Tamiflu , continue follow total of 5 days, completing a course tomorrow - Wean off to room air as tolerated, still remains hypoxic and required up to 7 L when working with PT, currently on 2 L at rest - Continue DuoNebs, Pulmicort , Brovana  - Has been placed on steroids, continue - Continue diuresis  Active problems PAF, with RVR -on admission he was found to have rates into the 150s, has been placed on Cardizem  drip initially without improvement in the heart rates, and then placed on amiodarone . His home metoprolol  has been resumed, and with ongoing diuresis hemodynamics are better continue, discontinued amiodarone  infusion yesterday.  Rates are controlled  Acute on chronic diastolic CHF -he underwent a 2D echocardiogram on admission which showed LVEF 60-65%, mild LVH, RV was normal.  He has evidence of fluid overload now with lower extremity edema, continue furosemide , responding well  History of depression-continue sertraline   Underlying COPD-with  exacerbation, as discussed above  DM2, with hyperglycemia-A1c 7.1, acceptable TSH group.  Continue glargine as well as sliding scale  Right eye conjunctivitis-possibly viral, artificial tears  Thrombocytopenia-acute on chronic, no bleeding  CKD 3B-baseline creatinine around 1.5, currently better than baseline.  Continue Lasix  as he seems to be tolerating it  Obesity, morbid-BMI 45.  He would benefit from weight loss.   Scheduled Meds:  apixaban   5 mg Oral BID   arformoterol   15 mcg Nebulization BID   budesonide  (PULMICORT ) nebulizer solution  0.25 mg Nebulization BID   furosemide   40 mg Intravenous Q12H   guaiFENesin   600 mg Oral BID   insulin  aspart  0-5 Units Subcutaneous QHS   insulin  aspart  0-9 Units Subcutaneous TID WC   insulin  glargine  50 Units Subcutaneous Q2200   metoprolol  tartrate  50 mg Oral BID   oseltamivir   75 mg Oral BID   pantoprazole   40 mg Oral BID   predniSONE   40 mg Oral Q breakfast   rosuvastatin   40 mg Oral QHS   sertraline   200 mg Oral Daily   sodium chloride  flush  3 mL Intravenous Q12H   tamsulosin   0.4 mg Oral QPC supper   Continuous Infusions:   PRN Meds:.acetaminophen  **OR** acetaminophen , albuterol , artificial tears, HYDROcodone -acetaminophen , LORazepam , phenol, rOPINIRole , trimethobenzamide   Current Outpatient Medications  Medication Instructions   acetaminophen  (TYLENOL ) 1,000 mg, Oral, Daily PRN   amiodarone  (PACERONE ) 200 mg, Oral, Every evening   apixaban  (ELIQUIS ) 5 mg, Oral, 2 times daily   budesonide -glycopyrrolate-formoterol  (BREZTRI  AEROSPHERE) 160-9-4.8 MCG/ACT AERO inhaler 2 puffs, Inhalation, 2 times daily   cholecalciferol (VITAMIN D3) 1,000 Units, Oral, Daily   Coenzyme  Q10 (COQ-10 PO) 1 capsule, Oral, Every evening   dapagliflozin  propanediol (FARXIGA ) 10 mg, Oral, Daily before breakfast   ferrous sulfate 325 mg, Oral, Daily   HYDROcodone -acetaminophen  (NORCO) 10-325 MG tablet 1 tablet, Oral, Every 6 hours PRN   insulin   aspart (NOVOLOG  FLEXPEN) 100 UNIT/ML FlexPen Recommend increase preprandial (before meals) insulin  novolog  Check sugars before meals Take 6 U prior to breakfast, lunch, and supper. + 1 u if sugar 150-200. + 2 u if sugar between 201-250 + 3 U if sugar between 251-300 + 4 U if sugar between 301-350 + 5 U if sugar between 351-400 + 6 U if sugar > 400.   ipratropium-albuterol  (DUONEB) 0.5-2.5 (3) MG/3ML SOLN INHALE CONTENTS OF 1 VIAL VIA NEBULIZER EVERY 6 HOURS AS NEEDED FOR SHORTNESS OF BREATH   levocetirizine (XYZAL) 5 mg, Oral, Daily PRN   LORazepam  (ATIVAN ) 0.5 mg, Oral, At bedtime PRN   metolazone  (ZAROXOLYN ) 5 mg, Oral, Weekly, May take an additional 5 mg tablet for swelling if needed.    metoprolol  tartrate (LOPRESSOR ) 50 MG tablet TAKE 1 TABLET BY MOUTH EVERY MORNING, TAKE 1 TABLET IN THE EVENING, AND TAKE 1 TABLET AT BEDTIME.   montelukast  (SINGULAIR ) 10 mg, Oral, Daily at bedtime   Multiple Vitamins-Minerals (MULTIVITAMIN WITH MINERALS) tablet 1 tablet, Oral, Daily   Omeprazole 20 mg, Oral, 2 times daily   potassium chloride  SA (KLOR-CON  M) 20 MEQ tablet TAKE 2 TABLETS BY MOUTH TWICE DAILY AT BREAKFAST AND AT BEDTIME   rosuvastatin  (CRESTOR ) 40 mg, Oral, Daily at bedtime   Semaglutide  (OZEMPIC , 2 MG/DOSE, [place]) 2 mg, Subcutaneous, Weekly   sertraline  (ZOLOFT ) 200 mg, Oral, Daily   tamsulosin  (FLOMAX ) 0.4 MG CAPS capsule TAKE 1 CAPSULE BY MOUTH EVERY DAY AT BEDTIME   tiZANidine  (ZANAFLEX ) 2 mg, Oral, Daily at bedtime   torsemide  (DEMADEX ) 20 MG tablet TAKE 2 TABLETS BY MOUTH TWICE DAILY EACH MORNING AND AT NOON   Tresiba  FlexTouch 60 Units, Every evening   VENTOLIN  HFA 108 (90 Base) MCG/ACT inhaler INHALE 2 PUFFS BY MOUTH EVERY 6 HOURS AS NEEDED AS NEEDED FOR SHORTNESS OF BREATH/WHEEZING    Diet Orders (From admission, onward)     Start     Ordered   11/28/24 0740  Diet Carb Modified Room service appropriate? Yes  Diet effective now       Question Answer Comment  Diet-HS Snack? Nothing    Calorie Level Medium 1600-2000   Fluid consistency: Thin   Room service appropriate? Yes      11/28/24 0748            DVT prophylaxis:  apixaban  (ELIQUIS ) tablet 5 mg   Lab Results  Component Value Date   PLT 139 (L) 12/02/2024      Code Status: Full Code  Family Communication: Son at bedside  Status is: Inpatient Remains inpatient appropriate because: Severity of illness   Level of care: Progressive  Consultants:  None  Objective: Vitals:   12/02/24 0831 12/02/24 0916 12/02/24 0930 12/02/24 1234  BP: (!) 160/88   119/80  Pulse: 84  73 81  Resp:   18 12  Temp:    98.1 F (36.7 C)  TempSrc:    Oral  SpO2:  96% 98% 90%  Weight:      Height:        Intake/Output Summary (Last 24 hours) at 12/02/2024 1246 Last data filed at 12/02/2024 1243 Gross per 24 hour  Intake 243 ml  Output 3225 ml  Net -  2982 ml   Wt Readings from Last 3 Encounters:  12/02/24 (!) 158.2 kg  10/25/24 (!) 156 kg  09/07/24 (!) 149.7 kg    Examination:  Constitutional: NAD Eyes: lids and conjunctivae normal, no scleral icterus ENMT: mmm Neck: normal, supple Respiratory: Faint end expiratory wheezing, no crackles. Normal respiratory effort.  Cardiovascular: Regular rate and rhythm, no murmurs / rubs / gallops.  1+ LE edema. Abdomen: soft, no distention, no tenderness. Bowel sounds positive.    Data Reviewed: I have independently reviewed following labs and imaging studies   CBC Recent Labs  Lab 11/28/24 0537 11/29/24 0844 12/01/24 0253 12/02/24 0351  WBC 7.5 9.8 8.4 7.3  HGB 13.8 14.1 13.6 13.7  HCT 44.1 42.7 44.1 43.1  PLT 94* 118* 121* 139*  MCV 95.5 92.8 96.3 93.7  MCH 29.9 30.7 29.7 29.8  MCHC 31.3 33.0 30.8 31.8  RDW 15.9* 15.8* 15.6* 14.9    Recent Labs  Lab 11/28/24 0537 11/28/24 0558 11/28/24 0815 11/28/24 0824 11/29/24 0844 12/01/24 0253 12/02/24 0351  NA 141  --   --   --  139 136 139  K 4.2  --   --   --  4.2 4.2 3.6  CL 101  --   --   --  101  101 95*  CO2 29  --   --   --  27 26 36*  GLUCOSE 146*  --   --   --  113* 132* 101*  BUN 22  --   --   --  26* 23 24*  CREATININE 1.58*  --   --   --  1.29* 1.04 1.10  CALCIUM  9.0  --   --   --  9.2 9.2 9.9  AST  --   --   --   --  79*  --  46*  ALT  --   --   --   --  56*  --  45*  ALKPHOS  --   --   --   --  106  --  99  BILITOT  --   --   --   --  1.0  --  1.4*  ALBUMIN  --   --   --   --  3.3*  --  3.0*  MG  --   --  2.0  --   --  2.1 1.9  CRP 10.3*  --   --   --   --   --   --   PROCALCITON  --   --  0.25  --   --   --   --   LATICACIDVEN  --  2.1*  --  2.0*  --   --   --     ------------------------------------------------------------------------------------------------------------------ No results for input(s): CHOL, HDL, LDLCALC, TRIG, CHOLHDL, LDLDIRECT in the last 72 hours.  Lab Results  Component Value Date   HGBA1C 7.1 08/15/2024   ------------------------------------------------------------------------------------------------------------------ No results for input(s): TSH, T4TOTAL, T3FREE, THYROIDAB in the last 72 hours.  Invalid input(s): FREET3  Cardiac Enzymes No results for input(s): CKMB, TROPONINI, MYOGLOBIN in the last 168 hours.  Invalid input(s): CK ------------------------------------------------------------------------------------------------------------------ No results found for: BNP  CBG: Recent Labs  Lab 12/01/24 1135 12/01/24 2140 12/02/24 0614 12/02/24 0802 12/02/24 1153  GLUCAP 116* 174* 98 81 126*    Recent Results (from the past 240 hours)  Blood Culture (routine x 2)     Status: Abnormal (Preliminary result)   Collection Time: 11/28/24  5:25 AM   Specimen: BLOOD  Result Value Ref Range Status   Specimen Description BLOOD SITE NOT SPECIFIED  Final   Special Requests   Final    BOTTLES DRAWN AEROBIC AND ANAEROBIC Blood Culture adequate volume   Culture  Setup Time (A)  Final    GRAM VARIABLE  ROD ANAEROBIC BOTTLE ONLY CRITICAL RESULT CALLED TO, READ BACK BY AND VERIFIED WITH: PHARMD J FRANS [id number] AT 1011 BY CM    Culture (A)  Final    CORYNEBACTERIUM SPECIES Standardized susceptibility testing for this organism is not available. IDENTIFIED BY [facility] IDNETIFICATION CARD AS CORYNEBACTERIUM OTITIDIS. Performed at [facility], [address] [place], [place] [address]    Report Status PENDING  Incomplete  Blood Culture ID Panel (Reflexed)     Status: None   Collection Time: 11/28/24  5:25 AM  Result Value Ref Range Status   Enterococcus faecalis NOT DETECTED NOT DETECTED Final   Enterococcus Faecium NOT DETECTED NOT DETECTED Final   Listeria monocytogenes NOT DETECTED NOT DETECTED Final   Staphylococcus species NOT DETECTED NOT DETECTED Final   Staphylococcus aureus (BCID) NOT DETECTED NOT DETECTED Final   Staphylococcus epidermidis NOT DETECTED NOT DETECTED Final   Staphylococcus lugdunensis NOT DETECTED NOT DETECTED Final   Streptococcus species NOT DETECTED NOT DETECTED Final   Streptococcus agalactiae NOT DETECTED NOT DETECTED Final   Streptococcus pneumoniae NOT DETECTED NOT DETECTED Final   Streptococcus pyogenes NOT DETECTED NOT DETECTED Final   A.calcoaceticus-baumannii NOT DETECTED NOT DETECTED Final   Bacteroides fragilis NOT DETECTED NOT DETECTED Final   Enterobacterales NOT DETECTED NOT DETECTED Final   Enterobacter cloacae complex NOT DETECTED NOT DETECTED Final   Escherichia coli NOT DETECTED NOT DETECTED Final   Klebsiella aerogenes NOT DETECTED NOT DETECTED Final   Klebsiella oxytoca NOT DETECTED NOT DETECTED Final   Klebsiella pneumoniae NOT DETECTED NOT DETECTED Final   Proteus species NOT DETECTED NOT DETECTED Final   Salmonella species NOT DETECTED NOT DETECTED Final   Serratia marcescens NOT DETECTED NOT DETECTED Final   Haemophilus influenzae NOT DETECTED NOT DETECTED Final   Neisseria meningitidis NOT DETECTED NOT DETECTED Final    Pseudomonas aeruginosa NOT DETECTED NOT DETECTED Final   Stenotrophomonas maltophilia NOT DETECTED NOT DETECTED Final   Candida albicans NOT DETECTED NOT DETECTED Final   Candida auris NOT DETECTED NOT DETECTED Final   Candida glabrata NOT DETECTED NOT DETECTED Final   Candida krusei NOT DETECTED NOT DETECTED Final   Candida parapsilosis NOT DETECTED NOT DETECTED Final   Candida tropicalis NOT DETECTED NOT DETECTED Final   Cryptococcus neoformans/gattii NOT DETECTED NOT DETECTED Final    Comment: Performed at [facility], [address] [place], [place] [address]  Blood Culture (routine x 2)     Status: None (Preliminary result)   Collection Time: 11/28/24  5:37 AM   Specimen: BLOOD LEFT FOREARM  Result Value Ref Range Status   Specimen Description BLOOD LEFT FOREARM  Final   Special Requests   Final    BOTTLES DRAWN AEROBIC AND ANAEROBIC Blood Culture adequate volume   Culture   Final    NO GROWTH 4 DAYS Performed at [facility], [address] [place], [place] [address]    Report Status PENDING  Incomplete  Resp panel by RT-PCR (RSV, Flu A&B, Covid) Anterior Nasal Swab     Status: Abnormal   Collection Time: 11/28/24  5:41 AM   Specimen: Anterior Nasal Swab  Result Value  Ref Range Status   SARS Coronavirus 2 by RT PCR NEGATIVE NEGATIVE Final   Influenza A by PCR POSITIVE (A) NEGATIVE Final   Influenza B by PCR NEGATIVE NEGATIVE Final    Comment: (NOTE) The Xpert Xpress SARS-CoV-2/FLU/RSV plus assay is intended as an aid in the diagnosis of influenza from Nasopharyngeal swab specimens and should not be used as a sole basis for treatment. Nasal washings and aspirates are unacceptable for Xpert Xpress SARS-CoV-2/FLU/RSV testing.  Fact Sheet for Patients: bloggercourse.com  Fact Sheet for Healthcare Providers: seriousbroker.it  This test is not yet approved or cleared by the [place]  FDA and has been  authorized for detection and/or diagnosis of SARS-CoV-2 by FDA under an Emergency Use Authorization (EUA). This EUA will remain in effect (meaning this test can be used) for the duration of the COVID-19 declaration under Section 564(b)(1) of the Act, 21 U.S.C. section 360bbb-3(b)(1), unless the authorization is terminated or revoked.     Resp Syncytial Virus by PCR NEGATIVE NEGATIVE Final    Comment: (NOTE) Fact Sheet for Patients: bloggercourse.com  Fact Sheet for Healthcare Providers: seriousbroker.it  This test is not yet approved or cleared by the [place]  FDA and has been authorized for detection and/or diagnosis of SARS-CoV-2 by FDA under an Emergency Use Authorization (EUA). This EUA will remain in effect (meaning this test can be used) for the duration of the COVID-19 declaration under Section 564(b)(1) of the Act, 21 U.S.C. section 360bbb-3(b)(1), unless the authorization is terminated or revoked.  Performed at [facility], [address], [place], [place] [address]      Radiology Studies: No results found.   Nilda Fendt, MD, PhD [facility]  Between 7 am - 7 pm I am available, please contact me via Amion (for emergencies) or Securechat (non urgent messages)  Between 7 pm - 7 am I am not available, please contact night coverage MD/APP via Amion  "

## 2024-12-02 NOTE — Progress Notes (Signed)
" °   12/02/24 0930  Therapy Vitals  Pulse Rate 73  MEWS Score/Color  MEWS Score 0  MEWS Score Color Green  Oxygen Therapy/Pulse Ox  O2 Device HFNC (salter)  SpO2 98 %  O2 Therapy Oxygen humidified  O2 Flow Rate (L/min) (S)  2 L/min     Pt found on 8l hfnc with pt sats 100%. This RT slowly turned the flow from 8l -2l in increments with pt maintaining sats above >95%. Pt is tolerating well at this time. RT will monitor. "

## 2024-12-03 DIAGNOSIS — A419 Sepsis, unspecified organism: Secondary | ICD-10-CM | POA: Diagnosis not present

## 2024-12-03 DIAGNOSIS — R6520 Severe sepsis without septic shock: Secondary | ICD-10-CM | POA: Diagnosis not present

## 2024-12-03 DIAGNOSIS — J9601 Acute respiratory failure with hypoxia: Secondary | ICD-10-CM | POA: Diagnosis not present

## 2024-12-03 LAB — CULTURE, BLOOD (ROUTINE X 2)
Culture: NO GROWTH
Special Requests: ADEQUATE

## 2024-12-03 LAB — GLUCOSE, CAPILLARY
Glucose-Capillary: 73 mg/dL (ref 70–99)
Glucose-Capillary: 82 mg/dL (ref 70–99)
Glucose-Capillary: 115 mg/dL — ABNORMAL HIGH (ref 70–99)
Glucose-Capillary: 200 mg/dL — ABNORMAL HIGH (ref 70–99)
Glucose-Capillary: 205 mg/dL — ABNORMAL HIGH (ref 70–99)

## 2024-12-03 LAB — MAGNESIUM: Magnesium: 1.7 mg/dL (ref 1.7–2.4)

## 2024-12-03 LAB — BASIC METABOLIC PANEL WITH GFR
Anion gap: 9 (ref 5–15)
BUN: 27 mg/dL — ABNORMAL HIGH (ref 8–23)
CO2: 36 mmol/L — ABNORMAL HIGH (ref 22–32)
Calcium: 10.1 mg/dL (ref 8.9–10.3)
Chloride: 94 mmol/L — ABNORMAL LOW (ref 98–111)
Creatinine, Ser: 1.13 mg/dL (ref 0.61–1.24)
GFR, Estimated: >60 mL/min
Glucose, Bld: 75 mg/dL (ref 70–99)
Potassium: 3 mmol/L — ABNORMAL LOW (ref 3.5–5.1)
Sodium: 139 mmol/L (ref 135–145)

## 2024-12-03 MED ORDER — INSULIN GLARGINE-YFGN 100 UNIT/ML ~~LOC~~ SOLN
50.0000 [IU] | Freq: Every day | SUBCUTANEOUS | Status: DC
  Filled 2024-12-03: qty 0.5

## 2024-12-03 MED ORDER — POTASSIUM CHLORIDE CRYS ER 20 MEQ PO TBCR
40.0000 meq | EXTENDED_RELEASE_TABLET | ORAL | Status: AC
  Administered 2024-12-03 (×2): 40 meq via ORAL
  Filled 2024-12-03 (×2): qty 2

## 2024-12-03 MED ORDER — MAGNESIUM SULFATE 2 GM/50ML IV SOLN
2.0000 g | Freq: Once | INTRAVENOUS | Status: AC
  Administered 2024-12-03: 2 g via INTRAVENOUS
  Filled 2024-12-03: qty 50

## 2024-12-03 NOTE — Progress Notes (Signed)
 " PROGRESS NOTE  Shawn Sullivan FMW:[id number] DOB: 1945-12-24 DOA: 11/28/2024 PCP: Sherre Clapper, MD   LOS: 5 days   Brief Narrative / Interim history: 79 year old male with HTN, HLD, chronic diastolic CHF, DM2, COPD, CKD 3B comes into the hospital with shortness of breath and flulike illness.  Has been having URI, cough, fever and chills.  He tested positive for influenza A, and a chest x-ray showed multifocal pneumonia.  He was found to be hypoxemic requiring supplemental oxygen and was admitted to the hospital.  Subjective / 24h Interval events: In bed, denies any shortness of breath, feels a whole lot better.  Assesement and Plan: Principal problem Sepsis due to multifocal pneumonia caused by influenza A, acute hypoxic respiratory failure, COPD exacerbation-patient quite tachypneic on admission, hypoxic requiring supplemental oxygen.  Imaging showed multifocal pneumonia.  He was placed on Tamiflu , continue follow total of 5 days, completing a course today - He has been weaned off to room air today - Continue DuoNebs, Pulmicort , Brovana  - Has been placed on steroids, continue, start tapering off tomorrow  Active problems PAF, with RVR -on admission he was found to have rates into the 150s, has been placed on Cardizem  drip initially without improvement in the heart rates, and then placed on amiodarone  drip.  His home metoprolol  was resumed, and with improvement in his respiratory status his rates are now better controlled on amiodarone  drip was discontinued  Acute on chronic diastolic CHF -he underwent a 2D echocardiogram on admission which showed LVEF 60-65%, mild LVH, RV was normal.  He has evidence of fluid overload now with lower extremity edema, continue furosemide , responding well - Weight has improved from 353 from admission to 330 this morning.  Lower extremity swelling much better  History of depression-continue sertraline   Underlying COPD-with exacerbation, as discussed  above  Hypokalemia-replenish potassium and continue to monitor.  Magnesium  looks stable  DM2, with hyperglycemia-A1c 7.1, acceptable TSH group.  Continue glargine as well as sliding scale  Right eye conjunctivitis-possibly viral, artificial tears  Thrombocytopenia-acute on chronic, no bleeding  CKD 3B-baseline creatinine around 1.5, currently remains stable while on Lasix   Obesity, morbid-BMI 45.  He would benefit from weight loss.   Scheduled Meds:  apixaban   5 mg Oral BID   arformoterol   15 mcg Nebulization BID   budesonide  (PULMICORT ) nebulizer solution  0.25 mg Nebulization BID   furosemide   40 mg Intravenous Q12H   guaiFENesin   600 mg Oral BID   insulin  aspart  0-5 Units Subcutaneous QHS   insulin  aspart  0-9 Units Subcutaneous TID WC   insulin  glargine  50 Units Subcutaneous Q2200   metoprolol  tartrate  50 mg Oral BID   pantoprazole   40 mg Oral BID   predniSONE   40 mg Oral Q breakfast   rosuvastatin   40 mg Oral QHS   sertraline   200 mg Oral Daily   sodium chloride  flush  3 mL Intravenous Q12H   tamsulosin   0.4 mg Oral QPC supper   Continuous Infusions:   PRN Meds:.acetaminophen  **OR** acetaminophen , albuterol , artificial tears, HYDROcodone -acetaminophen , LORazepam , phenol, rOPINIRole , trimethobenzamide   Current Outpatient Medications  Medication Instructions   acetaminophen  (TYLENOL ) 1,000 mg, Oral, Daily PRN   amiodarone  (PACERONE ) 200 mg, Oral, Every evening   apixaban  (ELIQUIS ) 5 mg, Oral, 2 times daily   budesonide -glycopyrrolate-formoterol  (BREZTRI  AEROSPHERE) 160-9-4.8 MCG/ACT AERO inhaler 2 puffs, Inhalation, 2 times daily   cholecalciferol (VITAMIN D3) 1,000 Units, Oral, Daily   Coenzyme Q10 (COQ-10 PO) 1 capsule, Oral, Every evening  dapagliflozin  propanediol (FARXIGA ) 10 mg, Oral, Daily before breakfast   ferrous sulfate 325 mg, Oral, Daily   HYDROcodone -acetaminophen  (NORCO) 10-325 MG tablet 1 tablet, Oral, Every 6 hours PRN   insulin  aspart (NOVOLOG   FLEXPEN) 100 UNIT/ML FlexPen Recommend increase preprandial (before meals) insulin  novolog  Check sugars before meals Take 6 U prior to breakfast, lunch, and supper. + 1 u if sugar 150-200. + 2 u if sugar between 201-250 + 3 U if sugar between 251-300 + 4 U if sugar between 301-350 + 5 U if sugar between 351-400 + 6 U if sugar > 400.   ipratropium-albuterol  (DUONEB) 0.5-2.5 (3) MG/3ML SOLN INHALE CONTENTS OF 1 VIAL VIA NEBULIZER EVERY 6 HOURS AS NEEDED FOR SHORTNESS OF BREATH   levocetirizine (XYZAL) 5 mg, Oral, Daily PRN   LORazepam  (ATIVAN ) 0.5 mg, Oral, At bedtime PRN   metolazone  (ZAROXOLYN ) 5 mg, Oral, Weekly, May take an additional 5 mg tablet for swelling if needed.    metoprolol  tartrate (LOPRESSOR ) 50 MG tablet TAKE 1 TABLET BY MOUTH EVERY MORNING, TAKE 1 TABLET IN THE EVENING, AND TAKE 1 TABLET AT BEDTIME.   montelukast  (SINGULAIR ) 10 mg, Oral, Daily at bedtime   Multiple Vitamins-Minerals (MULTIVITAMIN WITH MINERALS) tablet 1 tablet, Oral, Daily   Omeprazole 20 mg, Oral, 2 times daily   potassium chloride  SA (KLOR-CON  M) 20 MEQ tablet TAKE 2 TABLETS BY MOUTH TWICE DAILY AT BREAKFAST AND AT BEDTIME   rosuvastatin  (CRESTOR ) 40 mg, Oral, Daily at bedtime   Semaglutide  (OZEMPIC , 2 MG/DOSE, Arion) 2 mg, Subcutaneous, Weekly   sertraline  (ZOLOFT ) 200 mg, Oral, Daily   tamsulosin  (FLOMAX ) 0.4 MG CAPS capsule TAKE 1 CAPSULE BY MOUTH EVERY DAY AT BEDTIME   tiZANidine  (ZANAFLEX ) 2 mg, Oral, Daily at bedtime   torsemide  (DEMADEX ) 20 MG tablet TAKE 2 TABLETS BY MOUTH TWICE DAILY EACH MORNING AND AT NOON   Tresiba  FlexTouch 60 Units, Every evening   VENTOLIN  HFA 108 (90 Base) MCG/ACT inhaler INHALE 2 PUFFS BY MOUTH EVERY 6 HOURS AS NEEDED AS NEEDED FOR SHORTNESS OF BREATH/WHEEZING    Diet Orders (From admission, onward)     Start     Ordered   11/28/24 0740  Diet Carb Modified Room service appropriate? Yes  Diet effective now       Question Answer Comment  Diet-HS Snack? Nothing   Calorie Level  Medium 1600-2000   Fluid consistency: Thin   Room service appropriate? Yes      11/28/24 0748            DVT prophylaxis:  apixaban  (ELIQUIS ) tablet 5 mg   Lab Results  Component Value Date   PLT 139 (L) 12/02/2024      Code Status: Full Code  Family Communication: Son at bedside  Status is: Inpatient Remains inpatient appropriate because: Severity of illness   Level of care: Progressive  Consultants:  None  Objective: Vitals:   12/03/24 0745 12/03/24 0908 12/03/24 0955 12/03/24 1204  BP: (!) 140/88  (!) 105/58   Pulse: 100  94 86  Resp: 15     Temp: 97.7 F (36.5 C)   98 F (36.7 C)  TempSrc: Oral   Oral  SpO2: 91% 92%  94%  Weight:      Height:        Intake/Output Summary (Last 24 hours) at 12/03/2024 1311 Last data filed at 12/03/2024 1302 Gross per 24 hour  Intake 3 ml  Output 3350 ml  Net -3347 ml   Wt Readings  from Last 3 Encounters:  12/03/24 (!) 149.9 kg  10/25/24 (!) 156 kg  09/07/24 (!) 149.7 kg    Examination:  Constitutional: NAD Eyes: lids and conjunctivae normal, no scleral icterus ENMT: mmm Neck: normal, supple Respiratory: Scattered wheezing, much improved.  No crackles Cardiovascular: Regular rate and rhythm, no murmurs / rubs / gallops.  1+ LE edema. Abdomen: soft, no distention, no tenderness. Bowel sounds positive.  Skin: no rashes Neurologic: no focal deficits, equal strength   Data Reviewed: I have independently reviewed following labs and imaging studies   CBC Recent Labs  Lab 11/28/24 0537 11/29/24 0844 12/01/24 0253 12/02/24 0351  WBC 7.5 9.8 8.4 7.3  HGB 13.8 14.1 13.6 13.7  HCT 44.1 42.7 44.1 43.1  PLT 94* 118* 121* 139*  MCV 95.5 92.8 96.3 93.7  MCH 29.9 30.7 29.7 29.8  MCHC 31.3 33.0 30.8 31.8  RDW 15.9* 15.8* 15.6* 14.9    Recent Labs  Lab 11/28/24 0537 11/28/24 0558 11/28/24 0815 11/28/24 0824 11/29/24 0844 12/01/24 0253 12/02/24 0351 12/03/24 0740  NA 141  --   --   --  139 136 139 139   K 4.2  --   --   --  4.2 4.2 3.6 3.0*  CL 101  --   --   --  101 101 95* 94*  CO2 29  --   --   --  27 26 36* 36*  GLUCOSE 146*  --   --   --  113* 132* 101* 75  BUN 22  --   --   --  26* 23 24* 27*  CREATININE 1.58*  --   --   --  1.29* 1.04 1.10 1.13  CALCIUM  9.0  --   --   --  9.2 9.2 9.9 10.1  AST  --   --   --   --  79*  --  46*  --   ALT  --   --   --   --  56*  --  45*  --   ALKPHOS  --   --   --   --  106  --  99  --   BILITOT  --   --   --   --  1.0  --  1.4*  --   ALBUMIN  --   --   --   --  3.3*  --  3.0*  --   MG  --   --  2.0  --   --  2.1 1.9 1.7  CRP 10.3*  --   --   --   --   --   --   --   PROCALCITON  --   --  0.25  --   --   --   --   --   LATICACIDVEN  --  2.1*  --  2.0*  --   --   --   --     ------------------------------------------------------------------------------------------------------------------ No results for input(s): CHOL, HDL, LDLCALC, TRIG, CHOLHDL, LDLDIRECT in the last 72 hours.  Lab Results  Component Value Date   HGBA1C 7.1 08/15/2024   ------------------------------------------------------------------------------------------------------------------ No results for input(s): TSH, T4TOTAL, T3FREE, THYROIDAB in the last 72 hours.  Invalid input(s): FREET3  Cardiac Enzymes No results for input(s): CKMB, TROPONINI, MYOGLOBIN in the last 168 hours.  Invalid input(s): CK ------------------------------------------------------------------------------------------------------------------ No results found for: BNP  CBG: Recent Labs  Lab 12/02/24 1644 12/02/24 2110 12/03/24 0553 12/03/24 0858 12/03/24 1201  GLUCAP 192* 204* 73 82 115*    Recent Results (from the past 240 hours)  Blood Culture (routine x 2)     Status: Abnormal (Preliminary result)   Collection Time: 11/28/24  5:25 AM   Specimen: BLOOD  Result Value Ref Range Status   Specimen Description BLOOD SITE NOT SPECIFIED  Final   Special  Requests   Final    BOTTLES DRAWN AEROBIC AND ANAEROBIC Blood Culture adequate volume   Culture  Setup Time (A)  Final    GRAM VARIABLE ROD ANAEROBIC BOTTLE ONLY CRITICAL RESULT CALLED TO, READ BACK BY AND VERIFIED WITH: PHARMD J FRANS [id number] AT 1011 BY CM    Culture (A)  Final    CORYNEBACTERIUM SPECIES Standardized susceptibility testing for this organism is not available. IDENTIFIED BY [facility] IDNETIFICATION CARD AS CORYNEBACTERIUM OTITIDIS. Performed at [facility], [address] [place], [place] [address]    Report Status PENDING  Incomplete  Blood Culture ID Panel (Reflexed)     Status: None   Collection Time: 11/28/24  5:25 AM  Result Value Ref Range Status   Enterococcus faecalis NOT DETECTED NOT DETECTED Final   Enterococcus Faecium NOT DETECTED NOT DETECTED Final   Listeria monocytogenes NOT DETECTED NOT DETECTED Final   Staphylococcus species NOT DETECTED NOT DETECTED Final   Staphylococcus aureus (BCID) NOT DETECTED NOT DETECTED Final   Staphylococcus epidermidis NOT DETECTED NOT DETECTED Final   Staphylococcus lugdunensis NOT DETECTED NOT DETECTED Final   Streptococcus species NOT DETECTED NOT DETECTED Final   Streptococcus agalactiae NOT DETECTED NOT DETECTED Final   Streptococcus pneumoniae NOT DETECTED NOT DETECTED Final   Streptococcus pyogenes NOT DETECTED NOT DETECTED Final   A.calcoaceticus-baumannii NOT DETECTED NOT DETECTED Final   Bacteroides fragilis NOT DETECTED NOT DETECTED Final   Enterobacterales NOT DETECTED NOT DETECTED Final   Enterobacter cloacae complex NOT DETECTED NOT DETECTED Final   Escherichia coli NOT DETECTED NOT DETECTED Final   Klebsiella aerogenes NOT DETECTED NOT DETECTED Final   Klebsiella oxytoca NOT DETECTED NOT DETECTED Final   Klebsiella pneumoniae NOT DETECTED NOT DETECTED Final   Proteus species NOT DETECTED NOT DETECTED Final   Salmonella species NOT DETECTED NOT DETECTED Final   Serratia marcescens NOT DETECTED NOT  DETECTED Final   Haemophilus influenzae NOT DETECTED NOT DETECTED Final   Neisseria meningitidis NOT DETECTED NOT DETECTED Final   Pseudomonas aeruginosa NOT DETECTED NOT DETECTED Final   Stenotrophomonas maltophilia NOT DETECTED NOT DETECTED Final   Candida albicans NOT DETECTED NOT DETECTED Final   Candida auris NOT DETECTED NOT DETECTED Final   Candida glabrata NOT DETECTED NOT DETECTED Final   Candida krusei NOT DETECTED NOT DETECTED Final   Candida parapsilosis NOT DETECTED NOT DETECTED Final   Candida tropicalis NOT DETECTED NOT DETECTED Final   Cryptococcus neoformans/gattii NOT DETECTED NOT DETECTED Final    Comment: Performed at [facility], [address], [place], [place] [address]  Blood Culture (routine x 2)     Status: None   Collection Time: 11/28/24  5:37 AM   Specimen: BLOOD LEFT FOREARM  Result Value Ref Range Status   Specimen Description BLOOD LEFT FOREARM  Final   Special Requests   Final    BOTTLES DRAWN AEROBIC AND ANAEROBIC Blood Culture adequate volume   Culture   Final    NO GROWTH 5 DAYS Performed at [facility], [address] [place], [place] [address]    Report Status 12/03/2024 FINAL  Final  Resp panel by RT-PCR (RSV, Flu A&B, Covid) Anterior Nasal Swab     Status: Abnormal   Collection Time: 11/28/24  5:41 AM   Specimen: Anterior Nasal Swab  Result Value Ref Range Status   SARS Coronavirus 2 by RT PCR NEGATIVE NEGATIVE Final   Influenza A by PCR POSITIVE (A) NEGATIVE Final   Influenza B by PCR NEGATIVE NEGATIVE Final    Comment: (NOTE) The Xpert Xpress SARS-CoV-2/FLU/RSV plus assay is intended as an aid in the diagnosis of influenza from Nasopharyngeal swab specimens and should not be used as a sole basis for treatment. Nasal washings and aspirates are unacceptable for Xpert Xpress SARS-CoV-2/FLU/RSV testing.  Fact Sheet for Patients: bloggercourse.com  Fact Sheet for Healthcare  Providers: seriousbroker.it  This test is not yet approved or cleared by the [place]  FDA and has been authorized for detection and/or diagnosis of SARS-CoV-2 by FDA under an Emergency Use Authorization (EUA). This EUA will remain in effect (meaning this test can be used) for the duration of the COVID-19 declaration under Section 564(b)(1) of the Act, 21 U.S.C. section 360bbb-3(b)(1), unless the authorization is terminated or revoked.     Resp Syncytial Virus by PCR NEGATIVE NEGATIVE Final    Comment: (NOTE) Fact Sheet for Patients: bloggercourse.com  Fact Sheet for Healthcare Providers: seriousbroker.it  This test is not yet approved or cleared by the [place]  FDA and has been authorized for detection and/or diagnosis of SARS-CoV-2 by FDA under an Emergency Use Authorization (EUA). This EUA will remain in effect (meaning this test can be used) for the duration of the COVID-19 declaration under Section 564(b)(1) of the Act, 21 U.S.C. section 360bbb-3(b)(1), unless the authorization is terminated or revoked.  Performed at [facility], [address] [place], [place] [address]      Radiology Studies: No results found.   Nilda Fendt, MD, PhD [facility]  Between 7 am - 7 pm I am available, please contact me via Amion (for emergencies) or Securechat (non urgent messages)  Between 7 pm - 7 am I am not available, please contact night coverage MD/APP via Amion  "

## 2024-12-03 NOTE — Progress Notes (Signed)
 Mobility Specialist Progress Note;   12/03/24 1201  Mobility  Activity Ambulated with assistance;Pivoted/transferred from chair to bed  Level of Assistance Contact guard assist, steadying assist  Assistive Device Front wheel walker  Distance Ambulated (ft) 40 ft  Activity Response Tolerated well  Mobility Referral Yes  Mobility visit 1 Mobility  Mobility Specialist Start Time (ACUTE ONLY) 1201  Mobility Specialist Stop Time (ACUTE ONLY) 1212  Mobility Specialist Time Calculation (min) (ACUTE ONLY) 11 min   Pt received in chair, requesting to transfer back to bed. Pt agreeable to ambulating short distance before transfer. Required MinG assistance for safety throughout. Able to ambulate on RA w/o fault. HR up to 135 bpm w/ exertion. Session limited d/t lunch tray arriving. Pt left sitting on EoB with all needs met, eating lunch. Family present. NT notified.   Lauraine Erm Mobility Specialist Please contact via SecureChat or [employer] [phone]

## 2024-12-03 NOTE — TOC Progression Note (Signed)
 Transition of Care [facility]) - Progression Note    Patient Details  Name: Shawn Sullivan MRN: [id number] Date of Birth: 07-01-1946  [facility]) CM/SW Contact  Hilda Wexler [place], [place] Phone Number: 12/03/2024, 12:40 PM  Clinical Narrative:    Met with patient at bedside, son and daughter in law present. Patient declined SNF at this time stating that he would like to return home with [facility]. Per patient is he already established with a home health agency for  PT and wound care but was not sure of name. Per patient and family at bedside patient lives alone but family remains actively involved in his daily care.   Jazmyn Offner, LCSW Transition of Care       Barriers to Discharge: Continued Medical Work up               Expected Discharge Plan and Services In-house Referral: Clinical Social Work                                             Social Drivers of Health (SDOH) Interventions SDOH Screenings   Food Insecurity: No Food Insecurity (11/28/2024)  Housing: Low Risk (11/28/2024)  Transportation Needs: No Transportation Needs (11/28/2024)  Utilities: Not At Risk (11/28/2024)  Alcohol  Screen: Low Risk (09/07/2024)  Depression (PHQ2-9): Low Risk (10/25/2024)  Recent Concern: Depression (PHQ2-9) - High Risk (09/07/2024)  Financial Resource Strain: Low Risk (09/07/2024)  Physical Activity: Inactive (09/07/2024)  Social Connections: Moderately Isolated (11/28/2024)  Stress: No Stress Concern Present (09/07/2024)  Tobacco Use: Medium Risk (11/28/2024)  Health Literacy: Adequate Health Literacy (09/07/2024)    Readmission Risk Interventions    02/08/2024    1:06 PM  Readmission Risk Prevention Plan  Transportation Screening Complete  PCP or Specialist Appt within 5-7 Days Complete  Home Care Screening Complete  Medication Review (RN CM) Complete

## 2024-12-04 ENCOUNTER — Other Ambulatory Visit (HOSPITAL_COMMUNITY): Payer: Self-pay

## 2024-12-04 LAB — BASIC METABOLIC PANEL WITH GFR
Anion gap: 9 (ref 5–15)
BUN: 28 mg/dL — ABNORMAL HIGH (ref 8–23)
CO2: 33 mmol/L — ABNORMAL HIGH (ref 22–32)
Calcium: 10 mg/dL (ref 8.9–10.3)
Chloride: 95 mmol/L — ABNORMAL LOW (ref 98–111)
Creatinine, Ser: 1.2 mg/dL (ref 0.61–1.24)
GFR, Estimated: >60 mL/min
Glucose, Bld: 136 mg/dL — ABNORMAL HIGH (ref 70–99)
Potassium: 3.4 mmol/L — ABNORMAL LOW (ref 3.5–5.1)
Sodium: 137 mmol/L (ref 135–145)

## 2024-12-04 LAB — MAGNESIUM: Magnesium: 2 mg/dL (ref 1.7–2.4)

## 2024-12-04 LAB — CBC
HCT: 44.5 % (ref 39.0–52.0)
Hemoglobin: 14.4 g/dL (ref 13.0–17.0)
MCH: 29.8 pg (ref 26.0–34.0)
MCHC: 32.4 g/dL (ref 30.0–36.0)
MCV: 92.1 fL (ref 80.0–100.0)
Platelets: 208 K/uL (ref 150–400)
RBC: 4.83 MIL/uL (ref 4.22–5.81)
RDW: 14.6 % (ref 11.5–15.5)
WBC: 9.4 K/uL (ref 4.0–10.5)
nRBC: 0 % (ref 0.0–0.2)

## 2024-12-04 LAB — GLUCOSE, CAPILLARY: Glucose-Capillary: 127 mg/dL — ABNORMAL HIGH (ref 70–99)

## 2024-12-04 MED ORDER — GUAIFENESIN ER 600 MG PO TB12
600.0000 mg | ORAL_TABLET | Freq: Two times a day (BID) | ORAL | 0 refills | Status: AC
  Filled 2024-12-04: qty 10, 5d supply, fill #0

## 2024-12-04 MED ORDER — POTASSIUM CHLORIDE CRYS ER 20 MEQ PO TBCR
40.0000 meq | EXTENDED_RELEASE_TABLET | Freq: Once | ORAL | Status: AC
  Administered 2024-12-04: 40 meq via ORAL
  Filled 2024-12-04: qty 2

## 2024-12-04 MED ORDER — PREDNISONE 10 MG PO TABS
ORAL_TABLET | ORAL | 0 refills | Status: AC
  Filled 2024-12-04: qty 20, 8d supply, fill #0

## 2024-12-04 MED ORDER — ROPINIROLE HCL 1 MG PO TABS
1.0000 mg | ORAL_TABLET | Freq: Every evening | ORAL | 0 refills | Status: AC | PRN
  Filled 2024-12-04: qty 30, 30d supply, fill #0

## 2024-12-04 NOTE — Plan of Care (Signed)

## 2024-12-04 NOTE — TOC Transition Note (Addendum)
 Transition of Care (TOC) - Discharge Note Rayfield Gobble RN, BSN Inpatient Care Management Unit 4E- RN Case Manager See Treatment Team for direct phone #   Patient Details  Name: Shawn Sullivan MRN: [id number] Date of Birth: 05/24/1946  Transition of Care [facility]) CM/SW Contact:  Gobble Rayfield Hurst, RN Phone Number: 12/04/2024, 11:34 AM   Clinical Narrative:    Pt stable for transition home today, has declined SNF and prefers to return home w/ family support.  Orders placed for [facility] and DME needs.   CM in to speak with pt at bedside, son also present.  Confirmed plan to return home w/ HH. List provided for [facility] choice Per CMS guidelines from phonefinancing.pl website with star ratings (copy placed in shadow chart)- per pt and son pt is already active with [facility]- per review in [facility]- confirmed pt has [facility] for [facility] services. They would like to continue with [facility].  Son requesting bariatric BSC- no preference for provider.  Confirmed # for [facility] scheduling- daughter Temple [phone]  Son to transport home.   Referral sent to [employer] for DME- bari-BSC via Hub and msg to liaison- DME to be delivered to pt prior to discharge.   Referral sent to [facility] for resumption of [facility] services via Hub and Msg to liaison- confirmed pt had HHRN/PT prior to admit- [facility] will follow for resumption with New orders.   IP CM interventions have been completed no further needs noted.     Final next level of care: Home w Home Health Services Barriers to Discharge: Barriers Resolved   Patient Goals and CMS Choice Patient states their goals for this hospitalization and ongoing recovery are:: return home CMS Medicare.gov Compare Post Acute Care list provided to:: Patient Choice offered to / list presented to : Patient, Adult Children      Discharge Placement             Home w/ [facility]          Discharge Plan and Services Additional resources added to the After Visit Summary for    In-house Referral: Clinical Social Work Discharge Planning Services: CM Consult Post Acute Care Choice: Home Health, Resumption of Svcs/PTA Provider, Durable Medical Equipment          DME Arranged: Bedside commode DME Agency: [facility] Date DME Agency Contacted: 12/04/24 Time DME Agency Contacted: 1000 Representative spoke with at DME Agency: [facility] HH Arranged: RN, PT, OT HH Agency: [facility] Date [facility] Agency Contacted: 12/04/24 Time HH Agency Contacted: 1000 Representative spoke with at [facility] Agency: [facility]Angie  Social Drivers of Health (SDOH) Interventions SDOH Screenings   Food Insecurity: No Food Insecurity (11/28/2024)  Housing: Low Risk (11/28/2024)  Transportation Needs: No Transportation Needs (11/28/2024)  Utilities: Not At Risk (11/28/2024)  Alcohol  Screen: Low Risk (09/07/2024)  Depression (PHQ2-9): Low Risk (10/25/2024)  Recent Concern: Depression (PHQ2-9) - High Risk (09/07/2024)  Financial Resource Strain: Low Risk (09/07/2024)  Physical Activity: Inactive (09/07/2024)  Social Connections: Moderately Isolated (11/28/2024)  Stress: No Stress Concern Present (09/07/2024)  Tobacco Use: Medium Risk (11/28/2024)  Health Literacy: Adequate Health Literacy (09/07/2024)     Readmission Risk Interventions    12/04/2024   11:34 AM 02/08/2024    1:06 PM  Readmission Risk Prevention Plan  Transportation Screening Complete Complete  PCP or Specialist Appt within 5-7 Days  Complete  Home Care Screening  Complete  Medication Review (RN CM)  Complete  HRI or Home Care Consult Complete   Social Work Consult for  Recovery Care Planning/Counseling Complete   Palliative Care Screening Not Applicable   Medication Review (RN Care Manager) Complete

## 2024-12-04 NOTE — Discharge Summary (Signed)
 "  Physician Discharge Summary  Shawn Sullivan FMW:[id number] DOB: Feb 01, 1946 DOA: 11/28/2024  PCP: Sherre Clapper, MD  Admit date: 11/28/2024 Discharge date: 12/04/2024  Admitted From: Home Disposition:  Home  Recommendations for Outpatient Follow-up:  Follow up with PCP in 1-2 weeks Please obtain BMP/CBC in one week  Home Health: PT Equipment/Devices: none  Discharge Condition: stable CODE STATUS: Full code Diet Orders (From admission, onward)     Start     Ordered   11/28/24 0740  Diet Carb Modified Room service appropriate? Yes  Diet effective now       Question Answer Comment  Diet-HS Snack? Nothing   Calorie Level Medium 1600-2000   Fluid consistency: Thin   Room service appropriate? Yes      11/28/24 0748            Brief Narrative / Interim history: 79 year old male with HTN, HLD, chronic diastolic CHF, DM2, COPD, CKD 3B comes into the hospital with shortness of breath and flulike illness.  Has been having URI, cough, fever and chills.  He tested positive for influenza A, and a chest x-ray showed multifocal pneumonia.  He was found to be hypoxemic requiring supplemental oxygen and was admitted to the hospital.  Hospital Course / Discharge diagnoses: Principal problem Sepsis due to multifocal pneumonia caused by influenza A, acute hypoxic respiratory failure, COPD exacerbation-patient quite tachypneic on admission, hypoxic requiring supplemental oxygen.  Imaging showed multifocal pneumonia.  He was placed on Tamiflu , nebulizers, supplemental oxygen as well as steroids.  He improved, he was weaned off to room air, now back to baseline, ambulatory, and will be discharged home in stable condition.  He will be on a prednisone  taper.   Active problems PAF, with RVR -on admission he was found to have rates into the 150s, has been placed on Cardizem  drip initially without improvement in the heart rates, and then placed on amiodarone  drip.  His home metoprolol  was resumed,  and with improvement in his respiratory status his rates are now better controlled on amiodarone  drip was discontinued.  He is to resume home medications, he is now rate controlled Acute on chronic diastolic CHF -he underwent a 2D echocardiogram on admission which showed LVEF 60-65%, mild LVH, RV was normal.  He has evidence of fluid overload now with lower extremity edema, continue furosemide , responding well, lost about 23 pounds with diuresis, he is close to euvolemia, his home Demadex  will be resumed at home. History of depression-continue sertraline  Underlying COPD-with exacerbation, as discussed above Hypokalemia-potassium replaced DM2, with hyperglycemia-A1c 7.1, acceptable TSH group.  Continue glargine as well as sliding scale Right eye conjunctivitis-possibly viral, artificial tears, resolved Thrombocytopenia-acute on chronic, no bleeding CKD 3B-baseline creatinine around 1.5, has remained stable with diuresis.  Outpatient follow-up with [facility] and he is established with them Obesity, morbid-BMI 45.  He would benefit from weight loss.  Sepsis ruled out   Discharge Instructions   Allergies as of 12/04/2024       Reactions   Ambien [zolpidem] Other (See Comments)   Made him crazy   Androgel [testosterone] Other (See Comments)   Pedal edema   Duloxetine Other (See Comments)   Hallucinations   Gabapentin Other (See Comments)   hallucinations   Ibuprofen Other (See Comments)   GI upset   Lyrica [pregabalin] Swelling   Biaxin [clarithromycin] Other (See Comments)   Taste perception alteration   Spironolactone Other (See Comments)   Unknown         Medication  List     STOP taking these medications    LORazepam  0.5 MG tablet Commonly known as: ATIVAN        TAKE these medications    rOPINIRole  1 MG tablet Commonly known as: REQUIP  Take 1 tablet (1 mg total) by mouth at bedtime as needed (restless legs). The timing of this medication is very important.    acetaminophen  500 MG tablet Commonly known as: TYLENOL  Take 1,000 mg by mouth daily as needed for moderate pain (pain score 4-6), headache or mild pain (pain score 1-3).   amiodarone  200 MG tablet Commonly known as: PACERONE  TAKE 1 TABLET BY MOUTH ONCE DAILY IN THE EVENING   apixaban  5 MG Tabs tablet Commonly known as: Eliquis  Take 1 tablet (5 mg total) by mouth 2 (two) times daily.   Breztri  Aerosphere 160-9-4.8 MCG/ACT Aero inhaler Generic drug: budesonide -glycopyrrolate-formoterol  Inhale 2 puffs into the lungs 2 (two) times daily. What changed:  how much to take when to take this   cholecalciferol 25 MCG (1000 UNIT) tablet Commonly known as: VITAMIN D3 Take 1,000 Units by mouth daily.   COQ-10 PO Take 1 capsule by mouth every evening.   dapagliflozin  propanediol 10 MG Tabs tablet Commonly known as: Farxiga  Take 1 tablet (10 mg total) by mouth daily before breakfast.   ferrous sulfate 325 (65 FE) MG EC tablet Take 325 mg by mouth daily.   guaiFENesin  600 MG 12 hr tablet Commonly known as: MUCINEX  Take 1 tablet (600 mg total) by mouth 2 (two) times daily.   HYDROcodone -acetaminophen  10-325 MG tablet Commonly known as: NORCO Take 1 tablet by mouth every 6 (six) hours as needed for moderate pain (pain score 4-6) or severe pain (pain score 7-10).   ipratropium-albuterol  0.5-2.5 (3) MG/3ML Soln Commonly known as: DUONEB INHALE CONTENTS OF 1 VIAL VIA NEBULIZER EVERY 6 HOURS AS NEEDED FOR SHORTNESS OF BREATH What changed: See the new instructions.   levocetirizine 5 MG tablet Commonly known as: XYZAL Take 5 mg by mouth daily as needed for allergies.   metolazone  5 MG tablet Commonly known as: ZAROXOLYN  Take 5 mg by mouth once a week. May take an additional 5 mg tablet for swelling if needed.   metoprolol  tartrate 50 MG tablet Commonly known as: LOPRESSOR  TAKE 1 TABLET BY MOUTH EVERY MORNING, TAKE 1 TABLET IN THE EVENING, AND TAKE 1 TABLET AT BEDTIME. What  changed:  how much to take how to take this when to take this additional instructions   montelukast  10 MG tablet Commonly known as: SINGULAIR  TAKE 1 TABLET BY MOUTH EVERY DAY AT BEDTIME What changed: when to take this   multivitamin with minerals tablet Take 1 tablet by mouth daily.   NovoLOG  FlexPen 100 UNIT/ML FlexPen Generic drug: insulin  aspart Recommend increase preprandial (before meals) insulin  novolog  Check sugars before meals Take 6 U prior to breakfast, lunch, and supper. + 1 u if sugar 150-200. + 2 u if sugar between 201-250 + 3 U if sugar between 251-300 + 4 U if sugar between 301-350 + 5 U if sugar between 351-400 + 6 U if sugar > 400. What changed:  how much to take how to take this when to take this additional instructions   Omeprazole 20 MG Tbec Take 20 mg by mouth in the morning and at bedtime.   OZEMPIC  (2 MG/DOSE) Slick Inject 2 mg into the skin once a week.   potassium chloride  SA 20 MEQ tablet Commonly known as: KLOR-CON  M TAKE 2 TABLETS BY  MOUTH TWICE DAILY AT BREAKFAST AND AT BEDTIME What changed: See the new instructions.   predniSONE  10 MG tablet Commonly known as: DELTASONE  Take 4 tablets (40 mg total) by mouth daily with breakfast for 2 days, THEN 3 tablets (30 mg total) daily with breakfast for 2 days, THEN 2 tablets (20 mg total) daily with breakfast for 2 days, THEN 1 tablet (10 mg total) daily with breakfast for 2 days. Start taking on: December 05, 2024   rosuvastatin  40 MG tablet Commonly known as: CRESTOR  TAKE 1 TABLET BY MOUTH DAILY AT BEDTIME What changed: when to take this   sertraline  100 MG tablet Commonly known as: ZOLOFT  Take 2 tablets (200 mg total) by mouth daily. What changed:  how much to take when to take this   tamsulosin  0.4 MG Caps capsule Commonly known as: FLOMAX  TAKE 1 CAPSULE BY MOUTH EVERY DAY AT BEDTIME What changed: See the new instructions.   tiZANidine  2 MG tablet Commonly known as: ZANAFLEX  TAKE 1 TABLET  BY MOUTH DAILY AT BEDTIME   torsemide  20 MG tablet Commonly known as: DEMADEX  TAKE 2 TABLETS BY MOUTH TWICE DAILY EACH MORNING AND AT NOON   Tresiba  FlexTouch 100 UNIT/ML FlexTouch Pen Generic drug: insulin  degludec Inject 60 Units into the skin every evening. What changed: how much to take   Ventolin  HFA 108 (90 Base) MCG/ACT inhaler Generic drug: albuterol  INHALE 2 PUFFS BY MOUTH EVERY 6 HOURS AS NEEDED AS NEEDED FOR SHORTNESS OF BREATH/WHEEZING               Durable Medical Equipment  (From admission, onward)           Start     Ordered   12/04/24 1007  For home use only DME Bedside commode  Once       Comments: Please give bariatric size  Question:  Patient needs a bedside commode to treat with the following condition  Answer:  Obesity   12/04/24 1007             Consultations: none  Procedures/Studies:   DG CHEST PORT 1 VIEW Result Date: 12/02/2024 EXAM: 1 VIEW(S) XRAY OF THE CHEST 12/02/2024 09:00:59 AM COMPARISON: 11/28/2024 CLINICAL HISTORY: CAP (community acquired pneumonia) FINDINGS: LUNGS AND PLEURA: Similar coarsened interstitial markings. Possible trace right pleural effusion. Similar mild pulmonary edema. No pneumothorax. HEART AND MEDIASTINUM: Stable cardiomegaly. Aortic atherosclerosis. BONES AND SOFT TISSUES: No acute osseous abnormality. IMPRESSION: 1. Similar mild pulmonary edema. 2. Possible trace right pleural effusion. Electronically signed by: Lonni Necessary MD 12/02/2024 12:53 PM EST RP Workstation: [id number]   ECHOCARDIOGRAM COMPLETE Result Date: 11/29/2024    ECHOCARDIOGRAM REPORT   Patient Name:   Shawn Sullivan Date of Exam: 11/29/2024 Medical Rec #:  [id number]          Height:       73.0 in Accession #:    [id number]         Weight:       353.4 lb Date of Birth:  01/12/1946          BSA:          2.741 m Patient Age:    78 years           BP:           141/99 mmHg Patient Gender: M                  HR:  125 bpm. Exam  Location:  Inpatient Procedure: 2D Echo, Cardiac Doppler and Color Doppler (Both Spectral and Color            Flow Doppler were utilized during procedure). Indications:    Atrial Fibrillation  History:        Patient has prior history of Echocardiogram examinations, most                 recent 12/04/2022. CHF, COPD, Arrythmias:Atrial Fibrillation,                 Signs/Symptoms:Edema; Risk Factors:Sleep Apnea, Diabetes,                 Dyslipidemia and Hypertension.  Sonographer:    Juliene Rucks Referring Phys: [phone] RONDELL A SMITH  Sonographer Comments: Patient is obese and Technically difficult study due to poor echo windows. Image acquisition challenging due to patient body habitus and Image acquisition challenging due to COPD. IMPRESSIONS  1. Left ventricular ejection fraction, by estimation, is 60 to 65%. The left ventricle has normal function. The left ventricle has no regional wall motion abnormalities. The left ventricular internal cavity size was mildly dilated. There is mild left ventricular hypertrophy. Left ventricular diastolic parameters are indeterminate.  2. Right ventricular systolic function is normal. The right ventricular size is normal.  3. The mitral valve is normal in structure. Trivial mitral valve regurgitation. No evidence of mitral stenosis.  4. The aortic valve has an indeterminant number of cusps. Aortic valve regurgitation is not visualized. No aortic stenosis is present. FINDINGS  Left Ventricle: Left ventricular ejection fraction, by estimation, is 60 to 65%. The left ventricle has normal function. The left ventricle has no regional wall motion abnormalities. The left ventricular internal cavity size was mildly dilated. There is  mild left ventricular hypertrophy. Left ventricular diastolic function could not be evaluated due to atrial fibrillation. Left ventricular diastolic parameters are indeterminate. Right Ventricle: The right ventricular size is normal. Right ventricular  systolic function is normal. Left Atrium: Left atrial size was normal in size. Right Atrium: Right atrial size was normal in size. Pericardium: There is no evidence of pericardial effusion. Mitral Valve: The mitral valve is normal in structure. Mild mitral annular calcification. Trivial mitral valve regurgitation. No evidence of mitral valve stenosis. Tricuspid Valve: The tricuspid valve is normal in structure. Tricuspid valve regurgitation is trivial. No evidence of tricuspid stenosis. Aortic Valve: The aortic valve has an indeterminant number of cusps. Aortic valve regurgitation is not visualized. No aortic stenosis is present. Pulmonic Valve: The pulmonic valve was not well visualized. Pulmonic valve regurgitation is not visualized. No evidence of pulmonic stenosis. Aorta: The aortic root is normal in size and structure. Venous: The inferior vena cava was not well visualized. IAS/Shunts: The interatrial septum was not well visualized.  LEFT VENTRICLE PLAX 2D LVIDd:         6.20 cm   Diastology LVIDs:         4.40 cm   LV e' medial:    8.16 cm/s LV PW:         1.30 cm   LV E/e' medial:  17.8 LV IVS:        1.10 cm   LV e' lateral:   9.03 cm/s LVOT diam:     2.00 cm   LV E/e' lateral: 16.1 LV SV:         45 LV SV Index:   16 LVOT Area:     3.14 cm  RIGHT VENTRICLE RV Basal diam:  3.20 cm RV Mid diam:    2.70 cm RV S prime:     10.90 cm/s TAPSE (M-mode): 1.7 cm LEFT ATRIUM             Index        RIGHT ATRIUM           Index LA diam:        5.40 cm 1.97 cm/m   RA Area:     23.40 cm LA Vol (A2C):   78.0 ml 28.46 ml/m  RA Volume:   72.80 ml  26.56 ml/m LA Vol (A4C):   62.1 ml 22.66 ml/m LA Biplane Vol: 69.2 ml 25.25 ml/m  AORTIC VALVE LVOT Vmax:   116.00 cm/s LVOT Vmean:  73.800 cm/s LVOT VTI:    0.142 m  AORTA Ao Root diam: 3.30 cm MITRAL VALVE MV Area (PHT): 3.65 cm     SHUNTS MV Decel Time: 208 msec     Systemic VTI:  0.14 m MV E velocity: 145.00 cm/s  Systemic Diam: 2.00 cm Redell Shallow MD  Electronically signed by Redell Shallow MD Signature Date/Time: 11/29/2024/11:53:21 AM    Final    DG Chest Port 1 View Result Date: 11/28/2024 EXAM: 1 VIEW(S) XRAY OF THE CHEST 11/28/2024 05:24:00 AM COMPARISON: 07/22/2024 CLINICAL HISTORY: sob FINDINGS: LUNGS AND PLEURA: Coarsened interstitial markings. Perihilar vascular fullness without overt edema. No pleural effusion. No pneumothorax. HEART AND MEDIASTINUM: Hiatal hernia identified. Cardiomegaly. Aortic atherosclerosis. BONES AND SOFT TISSUES: No acute osseous abnormality. IMPRESSION: 1. Coarsened interstitial markings and perihilar vascular fullness without overt edema. 2. Cardiomegaly. Electronically signed by: Waddell Calk MD 11/28/2024 05:48 AM EST RP Workstation: [id number]     Subjective: - no chest pain, shortness of breath, no abdominal pain, nausea or vomiting.   Discharge Exam: BP 111/63   Pulse 84   Temp 97.8 F (36.6 C) (Oral)   Resp 15   Ht 6' 1 (1.854 m)   Wt (!) 149.9 kg   SpO2 95%   BMI 43.60 kg/m   General: Pt is alert, awake, not in acute distress Cardiovascular: RRR, S1/S2 +, no rubs, no gallops Respiratory: CTA bilaterally, no wheezing, no rhonchi Abdominal: Soft, NT, ND, bowel sounds + Extremities: no edema, no cyanosis    The results of significant diagnostics from this hospitalization (including imaging, microbiology, ancillary and laboratory) are listed below for reference.     Microbiology: Recent Results (from the past 240 hours)  Blood Culture (routine x 2)     Status: Abnormal (Preliminary result)   Collection Time: 11/28/24  5:25 AM   Specimen: BLOOD  Result Value Ref Range Status   Specimen Description BLOOD SITE NOT SPECIFIED  Final   Special Requests   Final    BOTTLES DRAWN AEROBIC AND ANAEROBIC Blood Culture adequate volume   Culture  Setup Time (A)  Final    GRAM VARIABLE ROD ANAEROBIC BOTTLE ONLY CRITICAL RESULT CALLED TO, READ BACK BY AND VERIFIED WITH: PHARMD J FRANS [id number]  AT 1011 BY CM    Culture (A)  Final    CORYNEBACTERIUM SPECIES Standardized susceptibility testing for this organism is not available. IDENTIFIED BY [facility] IDNETIFICATION CARD AS CORYNEBACTERIUM OTITIDIS. Performed at [facility], [address], [place], [place] [address]    Report Status PENDING  Incomplete  Blood Culture ID Panel (Reflexed)     Status: None   Collection Time: 11/28/24  5:25 AM  Result Value Ref Range Status  Enterococcus faecalis NOT DETECTED NOT DETECTED Final   Enterococcus Faecium NOT DETECTED NOT DETECTED Final   Listeria monocytogenes NOT DETECTED NOT DETECTED Final   Staphylococcus species NOT DETECTED NOT DETECTED Final   Staphylococcus aureus (BCID) NOT DETECTED NOT DETECTED Final   Staphylococcus epidermidis NOT DETECTED NOT DETECTED Final   Staphylococcus lugdunensis NOT DETECTED NOT DETECTED Final   Streptococcus species NOT DETECTED NOT DETECTED Final   Streptococcus agalactiae NOT DETECTED NOT DETECTED Final   Streptococcus pneumoniae NOT DETECTED NOT DETECTED Final   Streptococcus pyogenes NOT DETECTED NOT DETECTED Final   A.calcoaceticus-baumannii NOT DETECTED NOT DETECTED Final   Bacteroides fragilis NOT DETECTED NOT DETECTED Final   Enterobacterales NOT DETECTED NOT DETECTED Final   Enterobacter cloacae complex NOT DETECTED NOT DETECTED Final   Escherichia coli NOT DETECTED NOT DETECTED Final   Klebsiella aerogenes NOT DETECTED NOT DETECTED Final   Klebsiella oxytoca NOT DETECTED NOT DETECTED Final   Klebsiella pneumoniae NOT DETECTED NOT DETECTED Final   Proteus species NOT DETECTED NOT DETECTED Final   Salmonella species NOT DETECTED NOT DETECTED Final   Serratia marcescens NOT DETECTED NOT DETECTED Final   Haemophilus influenzae NOT DETECTED NOT DETECTED Final   Neisseria meningitidis NOT DETECTED NOT DETECTED Final   Pseudomonas aeruginosa NOT DETECTED NOT DETECTED Final   Stenotrophomonas maltophilia NOT DETECTED NOT DETECTED Final    Candida albicans NOT DETECTED NOT DETECTED Final   Candida auris NOT DETECTED NOT DETECTED Final   Candida glabrata NOT DETECTED NOT DETECTED Final   Candida krusei NOT DETECTED NOT DETECTED Final   Candida parapsilosis NOT DETECTED NOT DETECTED Final   Candida tropicalis NOT DETECTED NOT DETECTED Final   Cryptococcus neoformans/gattii NOT DETECTED NOT DETECTED Final    Comment: Performed at [facility], [address] [place], [place] [address]  Blood Culture (routine x 2)     Status: None   Collection Time: 11/28/24  5:37 AM   Specimen: BLOOD LEFT FOREARM  Result Value Ref Range Status   Specimen Description BLOOD LEFT FOREARM  Final   Special Requests   Final    BOTTLES DRAWN AEROBIC AND ANAEROBIC Blood Culture adequate volume   Culture   Final    NO GROWTH 5 DAYS Performed at [facility], [address], [place], [place] [address]    Report Status 12/03/2024 FINAL  Final  Resp panel by RT-PCR (RSV, Flu A&B, Covid) Anterior Nasal Swab     Status: Abnormal   Collection Time: 11/28/24  5:41 AM   Specimen: Anterior Nasal Swab  Result Value Ref Range Status   SARS Coronavirus 2 by RT PCR NEGATIVE NEGATIVE Final   Influenza A by PCR POSITIVE (A) NEGATIVE Final   Influenza B by PCR NEGATIVE NEGATIVE Final    Comment: (NOTE) The Xpert Xpress SARS-CoV-2/FLU/RSV plus assay is intended as an aid in the diagnosis of influenza from Nasopharyngeal swab specimens and should not be used as a sole basis for treatment. Nasal washings and aspirates are unacceptable for Xpert Xpress SARS-CoV-2/FLU/RSV testing.  Fact Sheet for Patients: bloggercourse.com  Fact Sheet for Healthcare Providers: seriousbroker.it  This test is not yet approved or cleared by the [place]  FDA and has been authorized for detection and/or diagnosis of SARS-CoV-2 by FDA under an Emergency Use Authorization (EUA). This EUA will remain in effect  (meaning this test can be used) for the duration of the COVID-19 declaration under Section 564(b)(1) of the Act, 21 U.S.C. section 360bbb-3(b)(1), unless the  authorization is terminated or revoked.     Resp Syncytial Virus by PCR NEGATIVE NEGATIVE Final    Comment: (NOTE) Fact Sheet for Patients: bloggercourse.com  Fact Sheet for Healthcare Providers: seriousbroker.it  This test is not yet approved or cleared by the [place]  FDA and has been authorized for detection and/or diagnosis of SARS-CoV-2 by FDA under an Emergency Use Authorization (EUA). This EUA will remain in effect (meaning this test can be used) for the duration of the COVID-19 declaration under Section 564(b)(1) of the Act, 21 U.S.C. section 360bbb-3(b)(1), unless the authorization is terminated or revoked.  Performed at [facility], [address] [place], [place] [address]      Labs: Basic Metabolic Panel: Recent Labs  Lab 11/28/24 0815 11/29/24 0844 12/01/24 0253 12/02/24 0351 12/03/24 0740 12/04/24 0356  NA  --  139 136 139 139 137  K  --  4.2 4.2 3.6 3.0* 3.4*  CL  --  101 101 95* 94* 95*  CO2  --  27 26 36* 36* 33*  GLUCOSE  --  113* 132* 101* 75 136*  BUN  --  26* 23 24* 27* 28*  CREATININE  --  1.29* 1.04 1.10 1.13 1.20  CALCIUM   --  9.2 9.2 9.9 10.1 10.0  MG 2.0  --  2.1 1.9 1.7 2.0   Liver Function Tests: Recent Labs  Lab 11/29/24 0844 12/02/24 0351  AST 79* 46*  ALT 56* 45*  ALKPHOS 106 99  BILITOT 1.0 1.4*  PROT 6.5 6.0*  ALBUMIN 3.3* 3.0*   CBC: Recent Labs  Lab 11/28/24 0537 11/29/24 0844 12/01/24 0253 12/02/24 0351 12/04/24 0356  WBC 7.5 9.8 8.4 7.3 9.4  HGB 13.8 14.1 13.6 13.7 14.4  HCT 44.1 42.7 44.1 43.1 44.5  MCV 95.5 92.8 96.3 93.7 92.1  PLT 94* 118* 121* 139* 208   CBG: Recent Labs  Lab 12/03/24 0858 12/03/24 1201 12/03/24 1547 12/03/24 2120 12/04/24 0617  GLUCAP 82 115* 200* 205* 127*    Hgb A1c No results for input(s): HGBA1C in the last 72 hours. Lipid Profile No results for input(s): CHOL, HDL, LDLCALC, TRIG, CHOLHDL, LDLDIRECT in the last 72 hours. Thyroid  function studies No results for input(s): TSH, T4TOTAL, T3FREE, THYROIDAB in the last 72 hours.  Invalid input(s): FREET3 Urinalysis    Component Value Date/Time   COLORURINE YELLOW 07/22/2024 1527   APPEARANCEUR CLEAR 07/22/2024 1527   LABSPEC 1.010 07/22/2024 1527   PHURINE 6.5 07/22/2024 1527   GLUCOSEU >=500 (A) 07/22/2024 1527   HGBUR NEGATIVE 07/22/2024 1527   BILIRUBINUR NEGATIVE 07/22/2024 1527   BILIRUBINUR large (A) 05/06/2023 1543   KETONESUR NEGATIVE 07/22/2024 1527   PROTEINUR NEGATIVE 07/22/2024 1527   UROBILINOGEN 2.0 (A) 05/06/2023 1543   NITRITE NEGATIVE 07/22/2024 1527   LEUKOCYTESUR SMALL (A) 07/22/2024 1527    FURTHER DISCHARGE INSTRUCTIONS:   Get Medicines reviewed and adjusted: Please take all your medications with you for your next visit with your Primary MD   Laboratory/radiological data: Please request your Primary MD to go over all hospital tests and procedure/radiological results at the follow up, please ask your Primary MD to get all Hospital records sent to his/her office.   In some cases, they will be blood work, cultures and biopsy results pending at the time of your discharge. Please request that your primary care M.D. goes through all the records of your hospital data and follows up on these results.   Also Note the following: If you  experience worsening of your admission symptoms, develop shortness of breath, life threatening emergency, suicidal or homicidal thoughts you must seek medical attention immediately by calling 911 or calling your MD immediately  if symptoms less severe.   You must read complete instructions/literature along with all the possible adverse reactions/side effects for all the Medicines you take and that have been  prescribed to you. Take any new Medicines after you have completely understood and accpet all the possible adverse reactions/side effects.    Do not drive when taking Pain medications or sleeping medications (Benzodaizepines)   Do not take more than prescribed Pain, Sleep and Anxiety Medications. It is not advisable to combine anxiety,sleep and pain medications without talking with your primary care practitioner   Special Instructions: If you have smoked or chewed Tobacco  in the last 2 yrs please stop smoking, stop any regular Alcohol   and or any Recreational drug use.   Wear Seat belts while driving.   Please note: You were cared for by a hospitalist during your hospital stay. Once you are discharged, your primary care physician will handle any further medical issues. Please note that NO REFILLS for any discharge medications will be authorized once you are discharged, as it is imperative that you return to your primary care physician (or establish a relationship with a primary care physician if you do not have one) for your post hospital discharge needs so that they can reassess your need for medications and monitor your lab values.  Time coordinating discharge: 35 minutes  SIGNED:  Nilda Fendt, MD, PhD 12/04/2024, 10:07 AM   "

## 2024-12-04 NOTE — Progress Notes (Signed)
 Discharge   Patient's son Carlin expressed verbal understanding of discharge POC.   Patient's son given time to ask any questions.  Additional education included in AVS.  Alert oriented in good spirits.   Tele and PIV removed. Pressure dressings intact. CCMD/GALE .   All personal belongings at bedtime.  Transferring to discharge lounge.  Transport on the unit waiting.   Needs.  TOC meds and DME/BSC (bariatric).

## 2024-12-04 NOTE — Care Management Important Message (Signed)
 Important Message  Patient Details  Name: Shawn Sullivan MRN: [id number] Date of Birth: 1946-06-21   Important Message Given:  Yes - Medicare IM     [name] Sharps 12/04/2024, 10:25 AM

## 2024-12-04 NOTE — TOC CM/SW Note (Signed)
 Durable Medical Equipment (From admission, onward)   Start     Ordered  12/04/24 1007  For home use only DME Bedside commode  Once      Comments: Please give bariatric size Question:  Patient needs a bedside commode to treat with the following condition  Answer:  Obesity  12/04/24 1007

## 2024-12-05 ENCOUNTER — Telehealth: Payer: Self-pay

## 2024-12-05 NOTE — Transitions of Care (Post Inpatient/ED Visit) (Signed)
" ° °  12/05/2024  Name: Shawn Sullivan MRN: [id number] DOB: [birth year]  Today's TOC FU Call Status: Today's TOC FU Call Status:: Unsuccessful Call (1st Attempt) Unsuccessful Call (1st Attempt) Date: 12/05/24  Attempted to reach the patient regarding the most recent Inpatient/ED visit.  Follow Up Plan: Additional outreach attempts will be made to reach the patient to complete the Transitions of Care (Post Inpatient/ED visit) call.   Shona Prow RN, CCM [place]  VBCI-Population Health RN Care Manager [phone]  "

## 2024-12-06 ENCOUNTER — Telehealth: Payer: Self-pay

## 2024-12-06 NOTE — Transitions of Care (Post Inpatient/ED Visit) (Signed)
" ° °  12/06/2024  Name: Shawn Sullivan MRN: [id number] DOB: 09/10/1946  Today's TOC FU Call Status: Today's TOC FU Call Status:: Successful TOC FU Call Completed (Spoke briefly with patient who declined TOC call program stating he aleady has home health) TOC FU Call Complete Date: 12/06/24  Patient's Name and Date of Birth confirmed. DOB, Name   Shona Prow RN, CCM [place]  VBCI-Population Health RN Care Manager [phone]  "

## 2024-12-07 LAB — CULTURE, BLOOD (ROUTINE X 2): Special Requests: ADEQUATE

## 2024-12-08 ENCOUNTER — Telehealth: Payer: Self-pay

## 2024-12-08 NOTE — Telephone Encounter (Signed)
 Tried [facility], number given is unavailable.   Copied from CRM #[id number]. Topic: Clinical - Home Health Verbal Orders >> Dec 08, 2024 11:08 AM Donna BRAVO wrote: Caller/Agency: [facility] Callback Number: [phone] Service Requested: Physical Therapy Frequency: 1 week 1    2 week 3 Any new concerns about the patient? No

## 2024-12-08 NOTE — Telephone Encounter (Signed)
 Spoke with Lita and stated the orders are below are okay to move forward with. She verbalized understanding and has no questions at this time.   Copied from CRM #[id number]. Topic: Clinical - Home Health Verbal Orders >> Dec 08, 2024 10:05 AM Antwanette L wrote: Caller/Agency: [facility]/ [facility] Callback Number: [phone] Service Requested: Skilled Nursing Frequency: 1w3 Any new concerns about the patient? No.

## 2024-12-11 ENCOUNTER — Telehealth: Payer: Self-pay

## 2024-12-11 NOTE — Telephone Encounter (Signed)
 Called the phone number provided for home health and it rang 2 times then said sorry but the phone number you have called is unavailable at this time and please try again later.

## 2024-12-11 NOTE — Telephone Encounter (Signed)
 Copied from CRM [id number]. Topic: General - Other >> Dec 08, 2024  4:51 PM Kevelyn M wrote: Reason for CRM: Monique calling with [facility] home health requesting the status of verbal order.  Call back #[phone]

## 2024-12-18 ENCOUNTER — Telehealth: Payer: Self-pay

## 2024-12-18 NOTE — Telephone Encounter (Signed)
 Verbal ok for orders below given to [place] at number listed below.  Copied from CRM [id number]. Topic: Clinical - Home Health Verbal Orders >> Dec 15, 2024  4:14 PM Delon HERO wrote: Rosaline calling from [facility] is calling to request verbal orders for OT.  Frequency- 1 W 1, 2 W 2  CB- [phone] verbal ok on VM

## 2024-12-21 ENCOUNTER — Telehealth: Payer: Self-pay

## 2024-12-21 ENCOUNTER — Other Ambulatory Visit: Payer: Self-pay | Admitting: Family Medicine

## 2024-12-21 ENCOUNTER — Other Ambulatory Visit (HOSPITAL_COMMUNITY): Payer: Self-pay

## 2024-12-21 ENCOUNTER — Ambulatory Visit: Admitting: Family Medicine

## 2024-12-21 VITALS — BP 124/72 | HR 110 | Temp 98.0°F | Ht 73.0 in | Wt 312.0 lb

## 2024-12-21 DIAGNOSIS — L03115 Cellulitis of right lower limb: Secondary | ICD-10-CM

## 2024-12-21 DIAGNOSIS — E1142 Type 2 diabetes mellitus with diabetic polyneuropathy: Secondary | ICD-10-CM | POA: Diagnosis not present

## 2024-12-21 DIAGNOSIS — K5903 Drug induced constipation: Secondary | ICD-10-CM | POA: Diagnosis not present

## 2024-12-21 DIAGNOSIS — I480 Paroxysmal atrial fibrillation: Secondary | ICD-10-CM

## 2024-12-21 DIAGNOSIS — Z6841 Body Mass Index (BMI) 40.0 and over, adult: Secondary | ICD-10-CM | POA: Diagnosis not present

## 2024-12-21 DIAGNOSIS — L89301 Pressure ulcer of unspecified buttock, stage 1: Secondary | ICD-10-CM

## 2024-12-21 DIAGNOSIS — E782 Mixed hyperlipidemia: Secondary | ICD-10-CM | POA: Diagnosis not present

## 2024-12-21 DIAGNOSIS — J410 Simple chronic bronchitis: Secondary | ICD-10-CM

## 2024-12-21 DIAGNOSIS — G894 Chronic pain syndrome: Secondary | ICD-10-CM

## 2024-12-21 DIAGNOSIS — E66813 Obesity, class 3: Secondary | ICD-10-CM | POA: Diagnosis not present

## 2024-12-21 DIAGNOSIS — T402X5A Adverse effect of other opioids, initial encounter: Secondary | ICD-10-CM

## 2024-12-21 DIAGNOSIS — G4733 Obstructive sleep apnea (adult) (pediatric): Secondary | ICD-10-CM

## 2024-12-21 DIAGNOSIS — N1832 Chronic kidney disease, stage 3b: Secondary | ICD-10-CM | POA: Diagnosis not present

## 2024-12-21 DIAGNOSIS — E118 Type 2 diabetes mellitus with unspecified complications: Secondary | ICD-10-CM

## 2024-12-21 HISTORY — DX: Cellulitis of right lower limb: L03.115

## 2024-12-21 HISTORY — DX: Simple chronic bronchitis: J41.0

## 2024-12-21 MED ORDER — CEFTRIAXONE SODIUM 1 G IJ SOLR
1.0000 g | Freq: Once | INTRAMUSCULAR | Status: AC
  Administered 2024-12-21: 1 g via INTRAMUSCULAR

## 2024-12-21 MED ORDER — AMOXICILLIN-POT CLAVULANATE 875-125 MG PO TABS: 1.0000 | ORAL_TABLET | Freq: Two times a day (BID) | ORAL | 0 refills | Status: AC

## 2024-12-21 NOTE — Progress Notes (Signed)
 "  Subjective:  Patient ID: Shawn Sullivan, male    DOB: Nov 03, 1946  Age: 79 y.o. MRN: [id number]  Chief Complaint  Patient presents with   Hospitalization Follow-up    HPI: Discussed the use of AI scribe software for clinical note transcription with the patient, who gave verbal consent to proceed.  History of Present Illness Shawn Sullivan is an 79 year old male who presents with leg blisters and weakness.  Lower extremity blisters and edema - Blisters on legs began bubbling up on Monday - Redness and swelling have increased over the past few days - Area is painful - Swelling has decreased somewhat with leg elevation, but redness persists  Generalized weakness - Significant weakness since hospital discharge, described as 'weak as water' - Receiving home health care, including nursing and therapy  Dyspnea and respiratory symptoms - Persistent shortness of breath since discharge - Uses nebulizer approximately every other day - Out of Breztri  and Ventolin  inhalers, which are part of regular regimen  Sleep disturbance and fatigue - Difficulty sleeping at night, attributed to discomfort and breathing issues - Frequently sleeps during the day, which may contribute to nighttime insomnia - No depression, but feels tired  Glycemic control - Diabetes with most recent A1c of 7.1 approximately three months ago - On diet and Tresiba  for diabetes management. - on farxiga  and ozempic  at max doses.  - Rarely requires fast-acting insulin  due to stable blood glucose levels  Constipation - Constipation likely related to pain medication use - Bowel movements every two to three days - Not currently using Miralax regularly  Recent hospitalization and medication changes - Hospitalized from December 30th to January 5th for pneumonia, influenza, sepsis, and respiratory failure - Treated with Tamiflu , steroids, Mucinex , nebulizers, and IV medications for atrial fibrillation - Discharged  on metoprolol , torsemide , and other medications - Lost 23 pounds of fluid weight during hospitalization       10/25/2024    2:55 PM 09/07/2024   10:52 AM 08/15/2024    9:57 AM 12/20/2023   11:07 AM 08/12/2023    2:08 PM  Depression screen PHQ 2/9  Decreased Interest 0 2 3 1  0  Down, Depressed, Hopeless 2 2 3  0 0  PHQ - 2 Score 2 4 6 1  0  Altered sleeping 2 3 3 3    Tired, decreased energy 0 3 3 3    Change in appetite 0 3 3 0   Feeling bad or failure about yourself  0 1 1 0   Trouble concentrating 0 0 0 0   Moving slowly or fidgety/restless 0 1 1 0   Suicidal thoughts 0 0 1 0   PHQ-9 Score 4 15  18  7     Difficult doing work/chores    Not difficult at all      Data saved with a previous flowsheet row definition        10/25/2024    2:55 PM  Fall Risk   Falls in the past year? 1  Number falls in past yr: 1  Injury with Fall? 0   Risk for fall due to : History of fall(s)  Follow up Education provided;Falls evaluation completed     Data saved with a previous flowsheet row definition    Patient Care Team: Sherre Clapper, MD as PCP - General (Family Medicine) Nyle Rankin POUR, [facility] (Inactive) (Pharmacist) Monetta Redell PARAS, MD as Consulting Physician (Cardiology) Powell Slater RIGGERS as Physician Assistant (Physician Assistant) Trudy Lynwood DASEN, MD as  Referring Physician (Dermatology) Dolan Mateo Larger, MD as Consulting Physician (Nephrology)   Review of Systems  All other systems reviewed and are negative.   Medications Ordered Prior to Encounter[1] Past Medical History:  Diagnosis Date   Acquired thrombophilia 12/24/2021   Acute cough 08/23/2023   Acute recurrent frontal sinusitis 08/23/2023   Anticoagulated 07/01/2018   Anticoagulated 07/01/2018   Aortic atherosclerosis 02/06/2024   Atrial fibrillation, chronic (HCC) 05/31/2020   Benign essential hypertension 05/06/2013   Bilateral pseudophakia 12/16/2015   Cellulitis of scrotum 02/06/2024   Chronic  obstructive pulmonary disease (HCC) 11/05/2015   Chronic pain syndrome 08/30/2017   CKD stage 3b, GFR 30-44 ml/min (HCC) 07/01/2018   Class 3 severe obesity due to excess calories with serious comorbidity and body mass index (BMI) of 40.0 to 44.9 in adult (HCC) 10/04/2017   COPD (chronic obstructive pulmonary disease) (HCC) 07/01/2018   Depressive disorder 05/06/2013   Dermatochalasis 12/16/2015   Diabetic polyneuropathy (HCC) 02/23/2020   Disorder of bursae and tendons in shoulder region 01/05/2014   Edema 11/04/2015   Formatting of this note might be different from the original.  proBNP is low, 83, Echo wo findings of heart failure     Encounter for immunization 09/12/2023   Encounter for screening for pneumonia 05/06/2023   Gastroesophageal reflux disease without esophagitis 02/23/2020   Generalized edema 06/22/2016   Heart failure with preserved ejection fraction (HCC)    Hyperbilirubinemia 02/06/2024   Hyperlipidemia 12/16/2015   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 stage 3b chronic kidney disease (HCC) 06/14/2019   Hypertensive heart disease 07/01/2018   Low back pain 08/17/2013   Lumbosacral spondylosis 05/06/2013   Mild protein malnutrition 02/06/2024   Obstructive sleep apnea 07/01/2018   On amiodarone  therapy 03/24/2019   PAF (paroxysmal atrial fibrillation) (HCC) 07/01/2018   Persistent atrial fibrillation (HCC) 07/01/2018   Piriformis syndrome 05/03/2014   Postlaminectomy syndrome, lumbar region 05/06/2013   Presbyopia of both eyes 12/16/2015   Restless legs syndrome 03/23/2016   Restrictive lung disease 06/22/2016   Retroperitoneal mass 02/06/2024   Seasonal allergic rhinitis due to pollen 09/12/2023   SI (sacroiliac) pain 08/12/2020   Sleep apnea 05/06/2013   Splenomegaly 02/06/2024   Status post intraocular lens implant 12/16/2015   Thrombocytopenia 02/06/2024   Type 2 diabetes mellitus  without complications (HCC) 11/05/2015   Uncomplicated opioid dependence (HCC) 12/24/2021   Past Surgical History:  Procedure Laterality Date   APPENDECTOMY     BACK SURGERY     CARDIOVERSION N/A 12/01/2018   Procedure: CARDIOVERSION;  Surgeon: Francyne Headland, MD;  Location: [facility];  Service: Cardiovascular;  Laterality: N/A;   CATARACT EXTRACTION Bilateral    LUMBAR FUSION     NECK SURGERY      Family History  Problem Relation Age of Onset   Atrial fibrillation Mother    Cancer Mother        vaginal cancer   Heart disease Father    Social History   Socioeconomic History   Marital status: Widowed    Spouse name: Not on file   Number of children: Not on file   Years of education: Not on file   Highest education level: Not on file  Occupational History   Not on file  Tobacco Use   Smoking status: Former    Current packs/day: 0.00    Types: Cigarettes    Quit date: 1993    Years since quitting: 27.0  Smokeless tobacco: Never  Vaping Use   Vaping status: Never Used  Substance and Sexual Activity   Alcohol  use: Not Currently   Drug use: Not Currently    Types: Hydrocodone    Sexual activity: Not on file  Other Topics Concern   Not on file  Social History Narrative   Not on file   Social Drivers of Health   Tobacco Use: Medium Risk (12/25/2024)   Patient History    Smoking Tobacco Use: Former    Smokeless Tobacco Use: Never    Passive Exposure: Not on file  Financial Resource Strain: Low Risk (09/07/2024)   Overall Financial Resource Strain (CARDIA)    Difficulty of Paying Living Expenses: Not very hard  Food Insecurity: No Food Insecurity (11/28/2024)   Epic    Worried About Programme Researcher, Broadcasting/film/video in the Last Year: Never true    Ran Out of Food in the Last Year: Never true  Transportation Needs: No Transportation Needs (11/28/2024)   Epic    Lack of Transportation (Medical): No    Lack of Transportation (Non-Medical): No  Physical Activity: Inactive  (09/07/2024)   Exercise Vital Sign    Days of Exercise per Week: 0 days    Minutes of Exercise per Session: 0 min  Stress: No Stress Concern Present (09/07/2024)   [employer] of Occupational Health - Occupational Stress Questionnaire    Feeling of Stress: Not at all  Social Connections: Moderately Isolated (11/28/2024)   Social Connection and Isolation Panel    Frequency of Communication with Friends and Family: More than three times a week    Frequency of Social Gatherings with Friends and Family: Once a week    Attends Religious Services: 1 to 4 times per year    Active Member of [employer] or Organizations: No    Attends Banker Meetings: Never    Marital Status: Widowed  Depression (PHQ2-9): Low Risk (10/25/2024)   Depression (PHQ2-9)    PHQ-2 Score: 4  Recent Concern: Depression (PHQ2-9) - High Risk (09/07/2024)   Depression (PHQ2-9)    PHQ-2 Score: 15  Alcohol  Screen: Low Risk (09/07/2024)   Alcohol  Screen    Last Alcohol  Screening Score (AUDIT): 0  Housing: Low Risk (11/28/2024)   Epic    Unable to Pay for Housing in the Last Year: No    Number of Times Moved in the Last Year: 0    Homeless in the Last Year: No  Utilities: Not At Risk (11/28/2024)   Epic    Threatened with loss of utilities: No  Health Literacy: Adequate Health Literacy (09/07/2024)   B1300 Health Literacy    Frequency of need for help with medical instructions: Rarely    Objective:  BP 124/72   Pulse (!) 110   Temp 98 F (36.7 C)   Ht 6' 1 (1.854 m)   Wt (!) 312 lb (141.5 kg)   SpO2 96%   BMI 41.16 kg/m      12/21/2024   10:59 AM 12/04/2024    9:10 AM 12/04/2024    8:44 AM  BP/Weight  Systolic BP 124 111 111  Diastolic BP 72 63 63  Wt. (Lbs) 312    BMI 41.16 kg/m2      Physical Exam Vitals reviewed.  Constitutional:      Appearance: Normal appearance. He is obese.  Neck:     Vascular: No carotid bruit.  Cardiovascular:     Rate and Rhythm: Normal rate and regular  rhythm.  Pulses: Normal pulses.     Heart sounds: Normal heart sounds.  Pulmonary:     Effort: Pulmonary effort is normal.     Breath sounds: Normal breath sounds. No wheezing, rhonchi or rales.  Abdominal:     General: Bowel sounds are normal.     Palpations: Abdomen is soft.     Tenderness: There is no abdominal tenderness.  Musculoskeletal:     Right lower leg: No edema.     Left lower leg: Edema present.  Neurological:     Mental Status: He is alert and oriented to person, place, and time.  Psychiatric:        Mood and Affect: Mood normal.        Behavior: Behavior normal.         Diabetic foot exam was performed with the following findings:   Intact posterior tibialis and dorsalis pedis pulses Decreased sensation BL feet.  Edema, but most is in leg.      Lab Results  Component Value Date   WBC 8.5 12/21/2024   HGB 15.0 12/21/2024   HCT 46.9 12/21/2024   PLT 162 12/21/2024   GLUCOSE 134 (H) 12/21/2024   CHOL 143 12/21/2024   TRIG 142 12/21/2024   HDL 42 12/21/2024   LDLCALC 76 12/21/2024   ALT 52 (H) 12/21/2024   AST 46 (H) 12/21/2024   NA 147 (H) 12/21/2024   K 3.3 (L) 12/21/2024   CL 98 12/21/2024   CREATININE 1.78 (H) 12/21/2024   BUN 42 (H) 12/21/2024   CO2 25 12/21/2024   TSH 2.320 12/21/2024   INR 1.0 03/20/2024   HGBA1C 6.8 (H) 12/21/2024    Results for orders placed or performed in visit on 12/21/24  CBC with Differential/Platelet   Collection Time: 12/21/24 11:55 AM  Result Value Ref Range   WBC 8.5 3.4 - 10.8 x10E3/uL   RBC 5.07 4.14 - 5.80 x10E6/uL   Hemoglobin 15.0 13.0 - 17.7 g/dL   Hematocrit 53.0 62.4 - 51.0 %   MCV 93 79 - 97 fL   MCH 29.6 26.6 - 33.0 pg   MCHC 32.0 31.5 - 35.7 g/dL   RDW 85.5 88.3 - 84.5 %   Platelets 162 150 - 450 x10E3/uL   Neutrophils 72 Not Estab. %   Lymphs 19 Not Estab. %   Monocytes 8 Not Estab. %   Eos 1 Not Estab. %   Basos 0 Not Estab. %   Neutrophils Absolute 6.1 1.4 - 7.0 x10E3/uL    Lymphocytes Absolute 1.7 0.7 - 3.1 x10E3/uL   Monocytes Absolute 0.7 0.1 - 0.9 x10E3/uL   EOS (ABSOLUTE) 0.1 0.0 - 0.4 x10E3/uL   Basophils Absolute 0.0 0.0 - 0.2 x10E3/uL   Immature Granulocytes 0 Not Estab. %   Immature Grans (Abs) 0.0 0.0 - 0.1 x10E3/uL  Comprehensive metabolic panel with GFR   Collection Time: 12/21/24 11:55 AM  Result Value Ref Range   Glucose 134 (H) 70 - 99 mg/dL   BUN 42 (H) 8 - 27 mg/dL   Creatinine, Ser 8.21 (H) 0.76 - 1.27 mg/dL   eGFR 38 (L) >40 fO/fpw/8.26   BUN/Creatinine Ratio 24 10 - 24   Sodium 147 (H) 134 - 144 mmol/L   Potassium 3.3 (L) 3.5 - 5.2 mmol/L   Chloride 98 96 - 106 mmol/L   CO2 25 20 - 29 mmol/L   Calcium  10.1 8.6 - 10.2 mg/dL   Total Protein 6.8 6.0 - 8.5 g/dL   Albumin 3.8  3.8 - 4.8 g/dL   Globulin, Total 3.0 1.5 - 4.5 g/dL   Bilirubin Total 0.9 0.0 - 1.2 mg/dL   Alkaline Phosphatase 157 (H) 47 - 123 IU/L   AST 46 (H) 0 - 40 IU/L   ALT 52 (H) 0 - 44 IU/L  Lipid panel   Collection Time: 12/21/24 11:55 AM  Result Value Ref Range   Cholesterol, Total 143 100 - 199 mg/dL   Triglycerides 857 0 - 149 mg/dL   HDL 42 >60 mg/dL   VLDL Cholesterol Cal 25 5 - 40 mg/dL   LDL Chol Calc (NIH) 76 0 - 99 mg/dL   Chol/HDL Ratio 3.4 0.0 - 5.0 ratio  TSH   Collection Time: 12/21/24 11:55 AM  Result Value Ref Range   TSH 2.320 0.450 - 4.500 uIU/mL  Hemoglobin A1c   Collection Time: 12/21/24 11:55 AM  Result Value Ref Range   Hgb A1c MFr Bld 6.8 (H) 4.8 - 5.6 %   Est. average glucose Bld gHb Est-mCnc 148 mg/dL  .  Assessment & Plan:   Assessment & Plan Cellulitis of right lower extremity Acute cellulitis with worsening redness and swelling. Possible infection from blisters and fluid. - Administered Rocephin  injection and prescribed augmentin .  - Ordered blood work including blood count. - Scheduled follow-up in one week. - if worsens, instructed to go to ED.  Orders:   amoxicillin -clavulanate (AUGMENTIN ) 875-125 MG tablet; Take 1  tablet by mouth 2 (two) times daily.   cefTRIAXone  (ROCEPHIN ) injection 1 g  Simple chronic bronchitis (HCC) Recent exacerbation managed with albuterol  and Breztri . Breztri  supply gone. Awaiting PAP. - Provided Breztri  sample and sent message to pharmacist to work on breztri . - Ensure availability of albuterol  for nebulizer use.  Paroxysmal atrial fibrillation (HCC) Continue amiodarone , eliquis , Running low on eliquis . Must meed 3% of out of pocket pharmacy cost before can get PAP.  I discussed with our pharmacy. We will try to get him patient assistance for Xarelto isntead.     CKD stage 3b, GFR 30-44 ml/min (HCC) Check labs. Came back acute on ckd. Recommended hydrate over the weekend.  Recheck kidney function in one week.   Orders:   CBC with Differential/Platelet   Comprehensive metabolic panel with GFR  Diabetic polyneuropathy associated with type 2 diabetes mellitus (HCC) - on tresiba  50 U daily. Not requiring preprandial insulin .  - on farxiga .  - Recommend continue to work on eating healthy diet. - check sugars.  - check feet daily.   Orders:   Hemoglobin A1c   Mixed hyperlipidemia Hyperlipidemia well-controlled. - Continue current lipid management regimen. Orders:   Lipid panel   TSH   Therapeutic opioid-induced constipation (OIC) -Encourage daily use of Miralax or otc stool softener.  - consider additional prescription medicine.     Chronic pain syndrome Management per specialist.  On opioids.     Pressure injury of buttock, stage 1, unspecified laterality Home health care is applying barrier cream to BL buttocks.     Class 3 severe obesity due to excess calories with serious comorbidity and body mass index (BMI) of 40.0 to 44.9 in adult [facility]) - Continue to work on healthy diet.  Difficulty exercising currently. Home health care is coming out       Body mass index is 41.16 kg/m.    Meds ordered this encounter  Medications    amoxicillin -clavulanate (AUGMENTIN ) 875-125 MG tablet    Sig: Take 1 tablet by mouth 2 (two) times daily.  Dispense:  20 tablet    Refill:  0   cefTRIAXone  (ROCEPHIN ) injection 1 g    Orders Placed This Encounter  Procedures   CBC with Differential/Platelet   Comprehensive metabolic panel with GFR   Lipid panel   TSH   Hemoglobin A1c       Follow-up: Return in about 1 week (around 12/28/2024) for with Dr. Sherre or Dr. Sirivol for follow up celluitis.  An After Visit Summary was printed and given to the patient.  Abigail Sherre, MD [facility] [phone]     [1]  Current Outpatient Medications on File Prior to Visit  Medication Sig Dispense Refill   insulin  degludec (TRESIBA  FLEXTOUCH) 100 UNIT/ML FlexTouch Pen Inject 60 Units into the skin every evening. (Patient taking differently: Inject 50 Units into the skin every evening.)     rosuvastatin  (CRESTOR ) 40 MG tablet TAKE 1 TABLET BY MOUTH DAILY AT BEDTIME 30 tablet 11   tamsulosin  (FLOMAX ) 0.4 MG CAPS capsule TAKE 1 CAPSULE BY MOUTH EVERY DAY AT BEDTIME 30 capsule 10   tiZANidine  (ZANAFLEX ) 2 MG tablet TAKE 1 TABLET BY MOUTH DAILY AT BEDTIME 30 tablet 11   acetaminophen  (TYLENOL ) 500 MG tablet Take 1,000 mg by mouth daily as needed for moderate pain (pain score 4-6), headache or mild pain (pain score 1-3).     amiodarone  (PACERONE ) 200 MG tablet TAKE 1 TABLET BY MOUTH ONCE DAILY IN THE EVENING 30 tablet 3   apixaban  (ELIQUIS ) 5 MG TABS tablet Take 1 tablet (5 mg total) by mouth 2 (two) times daily.     budesonide -glycopyrrolate-formoterol  (BREZTRI  AEROSPHERE) 160-9-4.8 MCG/ACT AERO inhaler Inhale 2 puffs into the lungs 2 (two) times daily. (Patient taking differently: Inhale 1 puff into the lungs daily.) 10.7 g 3   cholecalciferol (VITAMIN D3) 25 MCG (1000 UT) tablet Take 1,000 Units by mouth daily.      Coenzyme Q10 (COQ-10 PO) Take 1 capsule by mouth every evening.     dapagliflozin  propanediol (FARXIGA ) 10 MG TABS  tablet Take 1 tablet (10 mg total) by mouth daily before breakfast. 90 tablet 3   ferrous sulfate 325 (65 FE) MG EC tablet Take 325 mg by mouth daily.     guaiFENesin  (MUCINEX ) 600 MG 12 hr tablet Take 1 tablet (600 mg total) by mouth 2 (two) times daily. 10 tablet 0   HYDROcodone -acetaminophen  (NORCO) 10-325 MG tablet Take 1 tablet by mouth every 6 (six) hours as needed for moderate pain (pain score 4-6) or severe pain (pain score 7-10).     insulin  aspart (NOVOLOG  FLEXPEN) 100 UNIT/ML FlexPen Recommend increase preprandial (before meals) insulin  novolog  Check sugars before meals Take 6 U prior to breakfast, lunch, and supper. + 1 u if sugar 150-200. + 2 u if sugar between 201-250 + 3 U if sugar between 251-300 + 4 U if sugar between 301-350 + 5 U if sugar between 351-400 + 6 U if sugar > 400. (Patient taking differently: Inject 0-3 Units into the skin daily. Sliding scale) 15 mL 2   ipratropium-albuterol  (DUONEB) 0.5-2.5 (3) MG/3ML SOLN INHALE CONTENTS OF 1 VIAL VIA NEBULIZER EVERY 6 HOURS AS NEEDED FOR SHORTNESS OF BREATH (Patient taking differently: Take 3 mLs by nebulization daily as needed.) 360 mL 10   levocetirizine (XYZAL) 5 MG tablet Take 5 mg by mouth daily as needed for allergies.     metolazone  (ZAROXOLYN ) 5 MG tablet Take 5 mg by mouth once a week. May take an additional 5 mg  tablet for swelling if needed.     metoprolol  tartrate (LOPRESSOR ) 50 MG tablet TAKE 1 TABLET BY MOUTH EVERY MORNING, TAKE 1 TABLET IN THE EVENING, AND TAKE 1 TABLET AT BEDTIME. (Patient taking differently: Take 50 mg by mouth 2 (two) times daily.) 45 tablet 0   Multiple Vitamins-Minerals (MULTIVITAMIN WITH MINERALS) tablet Take 1 tablet by mouth daily.     Omeprazole 20 MG TBEC Take 20 mg by mouth in the morning and at bedtime.     rOPINIRole  (REQUIP ) 1 MG tablet Take 1 tablet (1 mg total) by mouth at bedtime as needed (restless legs). 30 tablet 0   Semaglutide  (OZEMPIC , 2 MG/DOSE, [place]) Inject 2 mg into the skin once a  week.     sertraline  (ZOLOFT ) 100 MG tablet Take 2 tablets (200 mg total) by mouth daily. (Patient taking differently: Take 100 mg by mouth in the morning and at bedtime.) 180 tablet 1   torsemide  (DEMADEX ) 20 MG tablet TAKE 2 TABLETS BY MOUTH TWICE DAILY EACH MORNING AND AT NOON 120 tablet 11   VENTOLIN  HFA 108 (90 Base) MCG/ACT inhaler INHALE 2 PUFFS BY MOUTH EVERY 6 HOURS AS NEEDED AS NEEDED FOR SHORTNESS OF BREATH/WHEEZING 18 g 11   No current facility-administered medications on file prior to visit.   "

## 2024-12-21 NOTE — Telephone Encounter (Signed)
 PAP: Patient assistance application for Xarelto through [employer] ([employer]) has been mailed to pt's home address on file. Provider portion of application will be faxed to provider's office.

## 2024-12-22 ENCOUNTER — Inpatient Hospital Stay: Admitting: Family Medicine

## 2024-12-22 ENCOUNTER — Other Ambulatory Visit: Payer: Self-pay

## 2024-12-22 ENCOUNTER — Ambulatory Visit: Payer: Self-pay | Admitting: Family Medicine

## 2024-12-22 DIAGNOSIS — E876 Hypokalemia: Secondary | ICD-10-CM

## 2024-12-22 LAB — CBC WITH DIFFERENTIAL/PLATELET
Basophils Absolute: 0 x10E3/uL (ref 0.0–0.2)
Basos: 0 %
EOS (ABSOLUTE): 0.1 x10E3/uL (ref 0.0–0.4)
Eos: 1 %
Hematocrit: 46.9 % (ref 37.5–51.0)
Hemoglobin: 15 g/dL (ref 13.0–17.7)
Immature Grans (Abs): 0 x10E3/uL (ref 0.0–0.1)
Immature Granulocytes: 0 %
Lymphocytes Absolute: 1.7 x10E3/uL (ref 0.7–3.1)
Lymphs: 19 %
MCH: 29.6 pg (ref 26.6–33.0)
MCHC: 32 g/dL (ref 31.5–35.7)
MCV: 93 fL (ref 79–97)
Monocytes Absolute: 0.7 x10E3/uL (ref 0.1–0.9)
Monocytes: 8 %
Neutrophils Absolute: 6.1 x10E3/uL (ref 1.4–7.0)
Neutrophils: 72 %
Platelets: 162 x10E3/uL (ref 150–450)
RBC: 5.07 x10E6/uL (ref 4.14–5.80)
RDW: 14.4 % (ref 11.6–15.4)
WBC: 8.5 x10E3/uL (ref 3.4–10.8)

## 2024-12-22 LAB — COMPREHENSIVE METABOLIC PANEL WITH GFR
ALT: 52 IU/L — ABNORMAL HIGH (ref 0–44)
AST: 46 IU/L — ABNORMAL HIGH (ref 0–40)
Albumin: 3.8 g/dL (ref 3.8–4.8)
Alkaline Phosphatase: 157 IU/L — ABNORMAL HIGH (ref 47–123)
BUN/Creatinine Ratio: 24 (ref 10–24)
BUN: 42 mg/dL — ABNORMAL HIGH (ref 8–27)
Bilirubin Total: 0.9 mg/dL (ref 0.0–1.2)
CO2: 25 mmol/L (ref 20–29)
Calcium: 10.1 mg/dL (ref 8.6–10.2)
Chloride: 98 mmol/L (ref 96–106)
Creatinine, Ser: 1.78 mg/dL — ABNORMAL HIGH (ref 0.76–1.27)
Globulin, Total: 3 g/dL (ref 1.5–4.5)
Glucose: 134 mg/dL — ABNORMAL HIGH (ref 70–99)
Potassium: 3.3 mmol/L — ABNORMAL LOW (ref 3.5–5.2)
Sodium: 147 mmol/L — ABNORMAL HIGH (ref 134–144)
Total Protein: 6.8 g/dL (ref 6.0–8.5)
eGFR: 38 mL/min/1.73 — ABNORMAL LOW

## 2024-12-22 LAB — HEMOGLOBIN A1C
Est. average glucose Bld gHb Est-mCnc: 148 mg/dL
Hgb A1c MFr Bld: 6.8 % — ABNORMAL HIGH (ref 4.8–5.6)

## 2024-12-22 LAB — LIPID PANEL
Chol/HDL Ratio: 3.4 ratio (ref 0.0–5.0)
Cholesterol, Total: 143 mg/dL (ref 100–199)
HDL: 42 mg/dL
LDL Chol Calc (NIH): 76 mg/dL (ref 0–99)
Triglycerides: 142 mg/dL (ref 0–149)
VLDL Cholesterol Cal: 25 mg/dL (ref 5–40)

## 2024-12-22 LAB — TSH: TSH: 2.32 u[IU]/mL (ref 0.450–4.500)

## 2024-12-22 MED ORDER — POTASSIUM CHLORIDE CRYS ER 20 MEQ PO TBCR: 40.0000 meq | EXTENDED_RELEASE_TABLET | Freq: Three times a day (TID) | ORAL | 1 refills | Status: AC

## 2024-12-22 MED ORDER — POTASSIUM CHLORIDE CRYS ER 20 MEQ PO TBCR: 40.0000 meq | EXTENDED_RELEASE_TABLET | Freq: Three times a day (TID) | ORAL | 0 refills | Status: DC

## 2024-12-25 ENCOUNTER — Encounter: Payer: Self-pay | Admitting: Family Medicine

## 2024-12-25 DIAGNOSIS — K5903 Drug induced constipation: Secondary | ICD-10-CM

## 2024-12-25 DIAGNOSIS — L89301 Pressure ulcer of unspecified buttock, stage 1: Secondary | ICD-10-CM

## 2024-12-25 HISTORY — DX: Pressure ulcer of unspecified buttock, stage 1: L89.301

## 2024-12-25 HISTORY — DX: Drug induced constipation: K59.03

## 2024-12-25 NOTE — Assessment & Plan Note (Signed)
-  Encourage daily use of Miralax or otc stool softener.  - consider additional prescription medicine.

## 2024-12-25 NOTE — Assessment & Plan Note (Signed)
 Hyperlipidemia well-controlled. - Continue current lipid management regimen. Orders:   Lipid panel   TSH

## 2024-12-25 NOTE — Assessment & Plan Note (Signed)
-   Continue to work on [employer].  Difficulty exercising currently. Home health care is coming out

## 2024-12-25 NOTE — Assessment & Plan Note (Signed)
 Check labs. Came back acute on ckd. Recommended hydrate over the weekend.  Recheck kidney function in one week.   Orders:   CBC with Differential/Platelet   Comprehensive metabolic panel with GFR

## 2024-12-25 NOTE — Assessment & Plan Note (Signed)
 Acute cellulitis with worsening redness and swelling. Possible infection from blisters and fluid. - Administered Rocephin  injection and prescribed augmentin .  - Ordered blood work including blood count. - Scheduled follow-up in one week. - if worsens, instructed to go to ED.  Orders:   amoxicillin -clavulanate (AUGMENTIN ) 875-125 MG tablet; Take 1 tablet by mouth 2 (two) times daily.   cefTRIAXone  (ROCEPHIN ) injection 1 g

## 2024-12-25 NOTE — Assessment & Plan Note (Signed)
 Recent exacerbation managed with albuterol  and Breztri . Breztri  supply gone. Awaiting PAP. - Provided Breztri  sample and sent message to pharmacist to work on breztri . - Ensure availability of albuterol  for nebulizer use.

## 2024-12-25 NOTE — Assessment & Plan Note (Signed)
 Management per specialist.  On opioids.

## 2024-12-25 NOTE — Assessment & Plan Note (Signed)
 Continue amiodarone , eliquis , Running low on eliquis . Must meed 3% of out of pocket pharmacy cost before can get PAP.  I discussed with our pharmacy. We will try to get him patient assistance for Xarelto isntead.

## 2024-12-25 NOTE — Assessment & Plan Note (Signed)
 Home health care is applying barrier cream to BL buttocks.

## 2024-12-25 NOTE — Assessment & Plan Note (Signed)
-   on tresiba  50 U daily. Not requiring preprandial insulin .  - on farxiga .  - Recommend continue to work on eating healthy diet. - check sugars.  - check feet daily.   Orders:   Hemoglobin A1c

## 2024-12-25 NOTE — Patient Instructions (Addendum)
" °  VISIT SUMMARY: During your visit, we addressed several health concerns including leg blisters, weakness, shortness of breath, sleep disturbance, diabetes management, and constipation. We provided treatments and made adjustments to your medications to help manage these issues.  YOUR PLAN: CELLULITIS OF RIGHT LOWER EXTREMITY: You have an infection in your right leg causing redness and swelling. -We gave you a Rocephin  injection to treat the infection. -We ordered blood work to check your blood count. -Please follow up in one week.  PAROXYSMAL ATRIAL FIBRILLATION: You have a heart condition that causes irregular heartbeats. -Continue taking metoprolol  as prescribed to control your heart rate.  TYPE 2 DIABETES MELLITUS WITH INSULIN  USE: Your diabetes is being managed with Tresiba , farxiga , and Ozempic , and your recent A1c level is at goal, indicating good control.  OBSTRUCTIVE SLEEP APNEA: You are having difficulty sleeping at night and compensating with daytime sleep. -Practice good sleep hygiene by limiting daytime naps and avoiding naps close to bedtime.  PRESSURE ULCERS: You have pressure ulcers that are causing discomfort. -Continue repositioning yourself to avoid putting pressure on the affected areas.  CHRONIC OBSTRUCTIVE PULMONARY DISEASE: You have COPD and recently had an exacerbation. -We provided you with a Breztri  sample and sent a refill for your prescription. -Make sure you have albuterol  available for your nebulizer.  CONSTIPATION: You are experiencing constipation, likely related to pain medication use. -Use Miralax or the otc stool softener.  - consider additional prescription medicine.     Contains text generated by [employer].   "

## 2024-12-27 NOTE — Telephone Encounter (Signed)
 PAP: Application for Xarelto  has been submitted to [employer] via fax

## 2024-12-28 ENCOUNTER — Ambulatory Visit

## 2024-12-28 ENCOUNTER — Telehealth: Payer: Self-pay

## 2024-12-28 NOTE — Telephone Encounter (Signed)
 PAP: Patient assistance application for Xarelto has been approved by PAP Companies: [employer] from 12/28/2024 to 11/29/2025. Medication should be delivered to PAP Delivery: Home. For further shipping updates, please contact Vicci ODESSIA Vicci ([employer]) at [phone]. Patient ID is: Approval letter uploaded to media tab

## 2024-12-28 NOTE — Telephone Encounter (Signed)
 Spoke with [facility] and provided verbal ok for orders below.  Copied from CRM #[id number]. Topic: Clinical - Home Health Verbal Orders >> Dec 28, 2024 10:14 AM Treva T wrote: Caller/Agency: [facility] Callback Number: [phone] Service Requested: Physical Therapy Frequency: Once a week for 4 weeks Any new concerns about the patient? No

## 2024-12-29 ENCOUNTER — Telehealth: Payer: Self-pay

## 2024-12-29 NOTE — Telephone Encounter (Signed)
 Spoke with Shona with [facility], per Dr. Sherre prefers if someone (nurse) can get out to patient today she wants his blood drawn due to decreased kidney function. She also gave verbal order for them to draw a CBC w/diff. Shona states she will see if anyone has availability this afternoon to see patient (they have only been doing emergency cases due to weather conditions) She stated patient has been informed that if he develops a fever or symptoms worsen to call EMS.  Copied from CRM #[id number]. Topic: Clinical - Request for Lab/Test Order >> Dec 29, 2024  9:14 AM Vena HERO wrote: Reason for CRM: Shona from [facility] [phone], called in to request that lab orders be moved to next week. Supposed to be done today but it will be very late by the time someone is able to do them, needs confirmation that next week draw is fine. Called CAL twice with no answer.

## 2025-01-02 ENCOUNTER — Ambulatory Visit

## 2025-01-03 ENCOUNTER — Ambulatory Visit: Payer: Self-pay

## 2025-01-03 ENCOUNTER — Emergency Department (HOSPITAL_COMMUNITY)

## 2025-01-03 ENCOUNTER — Emergency Department (HOSPITAL_COMMUNITY)
Admission: EM | Admit: 2025-01-03 | Discharge: 2025-01-03 | Discharge status: Against Medical Advice | Attending: Emergency Medicine | Admitting: Emergency Medicine

## 2025-01-03 ENCOUNTER — Other Ambulatory Visit: Payer: Self-pay

## 2025-01-03 ENCOUNTER — Encounter (HOSPITAL_COMMUNITY): Payer: Self-pay

## 2025-01-03 DIAGNOSIS — Z5321 Procedure and treatment not carried out due to patient leaving prior to being seen by health care provider: Secondary | ICD-10-CM | POA: Insufficient documentation

## 2025-01-03 DIAGNOSIS — I509 Heart failure, unspecified: Secondary | ICD-10-CM | POA: Insufficient documentation

## 2025-01-03 DIAGNOSIS — Z7901 Long term (current) use of anticoagulants: Secondary | ICD-10-CM | POA: Insufficient documentation

## 2025-01-03 DIAGNOSIS — W08XXXA Fall from other furniture, initial encounter: Secondary | ICD-10-CM | POA: Insufficient documentation

## 2025-01-03 DIAGNOSIS — M542 Cervicalgia: Secondary | ICD-10-CM | POA: Insufficient documentation

## 2025-01-03 LAB — CBC
HCT: 43.3 % (ref 39.0–52.0)
Hemoglobin: 13.4 g/dL (ref 13.0–17.0)
MCH: 30 pg (ref 26.0–34.0)
MCHC: 30.9 g/dL (ref 30.0–36.0)
MCV: 96.9 fL (ref 80.0–100.0)
Platelets: 128 10*3/uL — ABNORMAL LOW (ref 150–400)
RBC: 4.47 MIL/uL (ref 4.22–5.81)
RDW: 16.6 % — ABNORMAL HIGH (ref 11.5–15.5)
WBC: 7.4 10*3/uL (ref 4.0–10.5)
nRBC: 0 % (ref 0.0–0.2)

## 2025-01-03 LAB — BASIC METABOLIC PANEL WITH GFR
Anion gap: 9 (ref 5–15)
BUN: 22 mg/dL (ref 8–23)
CO2: 26 mmol/L (ref 22–32)
Calcium: 9.9 mg/dL (ref 8.9–10.3)
Chloride: 104 mmol/L (ref 98–111)
Creatinine, Ser: 1.4 mg/dL — ABNORMAL HIGH (ref 0.61–1.24)
GFR, Estimated: 51 mL/min — ABNORMAL LOW
Glucose, Bld: 115 mg/dL — ABNORMAL HIGH (ref 70–99)
Potassium: 4.5 mmol/L (ref 3.5–5.1)
Sodium: 140 mmol/L (ref 135–145)

## 2025-01-03 LAB — PRO BRAIN NATRIURETIC PEPTIDE: Pro Brain Natriuretic Peptide: 687 pg/mL — ABNORMAL HIGH

## 2025-01-03 NOTE — Telephone Encounter (Signed)
 FYI Only or Action Required?: FYI only for provider: ED advised.  Patient was last seen in primary care on 12/21/2024 by Sherre Clapper, MD.  Called Nurse Triage reporting Fall and Weight Gain.  Symptoms began several days ago.  Interventions attempted: Rest, hydration, or home remedies.  Symptoms are: rapidly worsening.  Triage Disposition: Go to ED Now (Notify PCP)  Patient/caregiver understands and will follow disposition?: Yes  Message from [place] sent at 01/03/2025  1:36 PM EST  Reason for Triage: fall (last week), pain in neck. weight gain (23lbs within 3 weeks and 6lbs since yesterday)   Reason for Disposition  Sounds like a serious injury to the triager  Answer Assessment - Initial Assessment Questions Home Health at home with patient and family-  Shawn Sullivan last week and hit his head- did not report it. Denies headache or vision changes. Vitals stable but having neck pain.   Home Health also reporting weight gain- CHF patient- 23lb weight gain in the last 3 weeks and 6 lbs gain since PT weighed patient yesterday. Was off his diet while family was out of town.   SOB at baseline but reported worsening.   Advised pt needs to be seen in the ED for worsened HF and evaluation of head s/p fall.    1. MECHANISM: How did the fall happen?     UTA 2. DOMESTIC VIOLENCE AND ELDER ABUSE SCREENING: Did you fall because someone pushed you or tried to hurt you? If Yes, ask: Are you safe now?     denies 3. ONSET: When did the fall happen? (e.g., minutes, hours, or days ago)     Last week 4. LOCATION: What part of the body hit the ground? (e.g., back, buttocks, head, hips, knees, hands, head, stomach)     head 5. INJURY: Did you hurt (injure) yourself when you fell? If Yes, ask: What did you injure? Tell me more about this? (e.g., body area; type of injury; pain severity)     Neck sore 6. PAIN: Is there any pain? If Yes, ask: How bad is the pain? (e.g., Scale 0-10; or  none, mild,      Neck sore- UTA level  Protocols used: Falls and [facility]

## 2025-01-03 NOTE — ED Triage Notes (Addendum)
 Pt to ED accompanied with son and daughter in law with multiple complaints. Family has not seen patient in 2 weeks and now concerned for pt.  Pt fell out of recliner one week ago while sleeping and hit head. Pt reports ongoing neck pain since. Pt family also concerned d/t 35 pound weight gain in 2 weeks, hx CHF, pt denies CP and SHOB. Hospitalized 1 month ago for pneumonia.   Pt was taken off eliquis  a few days ago, to start xerelto today

## 2025-01-03 NOTE — ED Provider Triage Note (Signed)
 Emergency Medicine Provider Triage Evaluation Note  Shawn Sullivan , a 79 y.o. male  was evaluated in triage.  Pt complains of neck pain. Was asleep in the recliner not reclined last week when he fell out onto the floor. On Eliquis  (ran out 3 days ago, is changing to Xarelto today).  Home health nurse reports 35lb weight gain in the past few weeks. Patient attributes this to eating. Hx CHF. No SHOB.   Review of Systems  Positive: Neck pain Negative: Vomiting, chest pain, abdominal pain  Physical Exam  BP (!) 142/80   Pulse (!) 115   Temp 97.7 F (36.5 C) (Oral)   Resp 18   Ht 6' 1 (1.854 m)   Wt (!) 150.6 kg   SpO2 98%   BMI 43.80 kg/m  Gen:   Awake, no distress   Resp:  Normal effort  MSK:   Moves extremities without difficulty  Other:    Medical Decision Making  Medically screening exam initiated at 4:29 PM.  Appropriate orders placed.  Shawn Sullivan was informed that the remainder of the evaluation will be completed by another provider, this initial triage assessment does not replace that evaluation, and the importance of remaining in the ED until their evaluation is complete.     Shawn Leita LABOR, PA-C 01/03/25 [phone]

## 2025-01-04 NOTE — Progress Notes (Unsigned)
 " Cardiology Office Note:    Date:  01/04/2025   ID:  Shawn Sullivan, DOB 08-18-1946, MRN [id number]  PCP:  Sherre Clapper, MD  Cardiologist:  Redell Leiter, MD    Referring MD: Sherre Clapper, MD    ASSESSMENT:    No diagnosis found. PLAN:    In order of problems listed above:  ***   Next appointment: ***   Medication Adjustments/Labs and Tests Ordered: Current medicines are reviewed at length with the patient today.  Concerns regarding medicines are outlined above.  No orders of the defined types were placed in this encounter.  No orders of the defined types were placed in this encounter.    History of Present Illness:    Shawn Sullivan is a 79 y.o. male with a hx of complex heart disease including paroxysmal atrial fibrillation maintaining sinus rhythm with low-dose amiodarone  chronic anticoagulation right bundle branch block hypertensive heart and kidney disease of chronic diastolic heart failure and stage III CKD mild aortic stenosis and hyper lipid anemia last seen 06/02/2023. Compliance with diet, lifestyle and medications: *** Past Medical History:  Diagnosis Date   Acquired thrombophilia 12/24/2021   Acute cough 08/23/2023   Acute recurrent frontal sinusitis 08/23/2023   Anticoagulated 07/01/2018   Anticoagulated 07/01/2018   Anxiety and depression 05/06/2013   Aortic atherosclerosis 02/06/2024   Atrial fibrillation, chronic (HCC) 05/31/2020   Benign essential hypertension 05/06/2013   Benign prostatic hyperplasia with lower urinary tract symptoms 08/20/2024   Bilateral pseudophakia 12/16/2015   Cellulitis of right lower extremity 12/21/2024   Cellulitis of scrotum 02/06/2024   Chronic combined systolic and diastolic congestive heart failure (HCC) 02/06/2024   Chronic pain syndrome 08/30/2017   CKD stage 3b, GFR 30-44 ml/min (HCC) 07/01/2018   Class 3 severe obesity due to excess calories with serious comorbidity and body mass index (BMI) of 40.0 to 44.9  in adult (HCC) 10/04/2017   Conjunctivitis 11/28/2024   COPD with acute exacerbation (HCC) 07/01/2018   COPD with exacerbation (HCC) 05/20/2024   Dermatochalasis 12/16/2015   Diabetic polyneuropathy (HCC) 02/23/2020   Disorder of bursae and tendons in shoulder region 01/05/2014   Edema 11/04/2015   Formatting of this note might be different from the original.  proBNP is low, 83, Echo wo findings of heart failure     Encounter for immunization 09/12/2023   Encounter for screening for pneumonia 05/06/2023   Gastroesophageal reflux disease without esophagitis 02/23/2020   Generalized edema 06/22/2016   Heart failure with recovered ejection fraction (HFrecEF) (HCC) 11/28/2024   Hyperbilirubinemia 02/06/2024   Hyperlipidemia 12/16/2015   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 stage 3b chronic kidney disease (HCC) 06/14/2019   Hypertensive heart disease 07/01/2018   Hypokalemia 10/04/2018   Influenza A 11/28/2024   Low back pain 08/17/2013   Lumbosacral spondylosis 05/06/2013   Mild protein malnutrition 02/06/2024   Obstructive sleep apnea 07/01/2018   On amiodarone  therapy 03/24/2019   Paroxysmal atrial fibrillation (HCC) 07/01/2018   Persistent atrial fibrillation (HCC) 07/01/2018   Piriformis syndrome 05/03/2014   Postlaminectomy syndrome, lumbar region 05/06/2013   Presbyopia of both eyes 12/16/2015   Pressure injury of buttock, stage 1 12/25/2024   Primary insomnia 08/20/2024   Restless legs syndrome 03/23/2016   Restrictive lung disease 06/22/2016   Retroperitoneal mass 02/06/2024   Seasonal allergic rhinitis due to pollen 09/12/2023   Sepsis (HCC) 11/28/2024   Severe episode of recurrent major depressive disorder, without psychotic  features (HCC) 08/20/2024   SI (sacroiliac) pain 08/12/2020   Simple chronic bronchitis (HCC) 12/21/2024   Sleep apnea 05/06/2013   Splenomegaly 02/06/2024   Status post  intraocular lens implant 12/16/2015   Therapeutic opioid-induced constipation (OIC) 12/25/2024   Thrombocytopenia 02/06/2024   Type 2 diabetes mellitus with complication, with long-term current use of insulin  (HCC) 11/05/2015   Uncomplicated opioid dependence (HCC) 12/24/2021    Current Medications: Active Medications[1]    EKGs/Labs/Other Studies Reviewed:    The following studies were reviewed today:  Cardiac Studies & Procedures   ______________________________________________________________________________________________     ECHOCARDIOGRAM  ECHOCARDIOGRAM COMPLETE 11/29/2024  Narrative ECHOCARDIOGRAM REPORT    Patient Name:   Shawn Sullivan Date of Exam: 11/29/2024 Medical Rec #:  [id number]          Height:       73.0 in Accession #:    [id number]         Weight:       353.4 lb Date of Birth:  11-Aug-1946          BSA:          2.741 m Patient Age:    78 years           BP:           141/99 mmHg Patient Gender: M                  HR:           125 bpm. Exam Location:  Inpatient  Procedure: 2D Echo, Cardiac Doppler and Color Doppler (Both Spectral and Color Flow Doppler were utilized during procedure).  Indications:    Atrial Fibrillation  History:        Patient has prior history of Echocardiogram examinations, most recent 12/04/2022. CHF, COPD, Arrythmias:Atrial Fibrillation, Signs/Symptoms:Edema; Risk Factors:Sleep Apnea, Diabetes, Dyslipidemia and Hypertension.  Sonographer:    Juliene Rucks Referring Phys: [id number] RONDELL A SMITH   Sonographer Comments: Patient is obese and Technically difficult study due to poor echo windows. Image acquisition challenging due to patient body habitus and Image acquisition challenging due to COPD. IMPRESSIONS   1. Left ventricular ejection fraction, by estimation, is 60 to 65%. The left ventricle has normal function. The left ventricle has no regional wall motion abnormalities. The left ventricular internal cavity size  was mildly dilated. There is mild left ventricular hypertrophy. Left ventricular diastolic parameters are indeterminate. 2. Right ventricular systolic function is normal. The right ventricular size is normal. 3. The mitral valve is normal in structure. Trivial mitral valve regurgitation. No evidence of mitral stenosis. 4. The aortic valve has an indeterminant number of cusps. Aortic valve regurgitation is not visualized. No aortic stenosis is present.  FINDINGS Left Ventricle: Left ventricular ejection fraction, by estimation, is 60 to 65%. The left ventricle has normal function. The left ventricle has no regional wall motion abnormalities. The left ventricular internal cavity size was mildly dilated. There is mild left ventricular hypertrophy. Left ventricular diastolic function could not be evaluated due to atrial fibrillation. Left ventricular diastolic parameters are indeterminate.  Right Ventricle: The right ventricular size is normal. Right ventricular systolic function is normal.  Left Atrium: Left atrial size was normal in size.  Right Atrium: Right atrial size was normal in size.  Pericardium: There is no evidence of pericardial effusion.  Mitral Valve: The mitral valve is normal in structure. Mild mitral annular calcification. Trivial mitral valve regurgitation. No evidence of mitral valve stenosis.  Tricuspid Valve:  The tricuspid valve is normal in structure. Tricuspid valve regurgitation is trivial. No evidence of tricuspid stenosis.  Aortic Valve: The aortic valve has an indeterminant number of cusps. Aortic valve regurgitation is not visualized. No aortic stenosis is present.  Pulmonic Valve: The pulmonic valve was not well visualized. Pulmonic valve regurgitation is not visualized. No evidence of pulmonic stenosis.  Aorta: The aortic root is normal in size and structure.  Venous: The inferior vena cava was not well visualized.  IAS/Shunts: The interatrial septum was not  well visualized.   LEFT VENTRICLE PLAX 2D LVIDd:         6.20 cm   Diastology LVIDs:         4.40 cm   LV e' medial:    8.16 cm/s LV PW:         1.30 cm   LV E/e' medial:  17.8 LV IVS:        1.10 cm   LV e' lateral:   9.03 cm/s LVOT diam:     2.00 cm   LV E/e' lateral: 16.1 LV SV:         45 LV SV Index:   16 LVOT Area:     3.14 cm   RIGHT VENTRICLE RV Basal diam:  3.20 cm RV Mid diam:    2.70 cm RV S prime:     10.90 cm/s TAPSE (M-mode): 1.7 cm  LEFT ATRIUM             Index        RIGHT ATRIUM           Index LA diam:        5.40 cm 1.97 cm/m   RA Area:     23.40 cm LA Vol (A2C):   78.0 ml 28.46 ml/m  RA Volume:   72.80 ml  26.56 ml/m LA Vol (A4C):   62.1 ml 22.66 ml/m LA Biplane Vol: 69.2 ml 25.25 ml/m AORTIC VALVE LVOT Vmax:   116.00 cm/s LVOT Vmean:  73.800 cm/s LVOT VTI:    0.142 m  AORTA Ao Root diam: 3.30 cm  MITRAL VALVE MV Area (PHT): 3.65 cm     SHUNTS MV Decel Time: 208 msec     Systemic VTI:  0.14 m MV E velocity: 145.00 cm/s  Systemic Diam: 2.00 cm  Redell Shallow MD Electronically signed by Redell Shallow MD Signature Date/Time: 11/29/2024/11:53:21 AM    Final    MONITORS  LONG TERM MONITOR (3-14 DAYS) 06/17/2023  Narrative Patch Wear Time:  6 days and 7 hours (2024-07-03T13:45:42-0400 to 2024-07-09T21:24:57-0400)  Patient had a min HR of 52 bpm, max HR of 98 bpm, and avg HR of 65 bpm. Predominant underlying rhythm was Sinus Rhythm. Bundle Branch Block/IVCD was present.  There were no triggered or diary events.  1 run of Supraventricular Tachycardia occurred lasting 4 beats with a max rate of 98 bpm (avg 91 bpm). Isolated SVEs were rare (<1.0%),  SVE Couplets were rare (<1.0%), and SVE Triplets were rare (<1.0%).  Isolated VEs were rare (<1.0%), and no VE Couplets or VE Triplets were present.       ______________________________________________________________________________________________          Recent  Labs: 12/04/2024: Magnesium  2.0 12/21/2024: ALT 52; TSH 2.320 01/03/2025: BUN 22; Creatinine, Ser 1.40; Hemoglobin 13.4; Platelets 128; Potassium 4.5; Pro Brain Natriuretic Peptide 687.0; Sodium 140  Recent Lipid Panel    Component Value Date/Time   CHOL 143 12/21/2024 1155   TRIG 142  12/21/2024 1155   HDL 42 12/21/2024 1155   CHOLHDL 3.4 12/21/2024 1155   LDLCALC 76 12/21/2024 1155    Physical Exam:    VS:  There were no vitals taken for this visit.    Wt Readings from Last 3 Encounters:  01/03/25 (!) 332 lb (150.6 kg)  12/21/24 (!) 312 lb (141.5 kg)  12/04/24 (!) 330 lb 7.5 oz (149.9 kg)     GEN: *** Well nourished, well developed in no acute distress HEENT: Normal NECK: No JVD; No carotid bruits LYMPHATICS: No lymphadenopathy CARDIAC: ***RRR, no murmurs, rubs, gallops RESPIRATORY:  Clear to auscultation without rales, wheezing or rhonchi  ABDOMEN: Soft, non-tender, non-distended MUSCULOSKELETAL:  No edema; No deformity  SKIN: Warm and dry NEUROLOGIC:  Alert and oriented x 3 PSYCHIATRIC:  Normal affect    Signed, Redell Leiter, MD  01/04/2025 1:30 PM    [facility]     [1]  No outpatient medications have been marked as taking for the 01/05/25 encounter (Appointment) with Leiter Redell PARAS, MD.   "

## 2025-01-05 ENCOUNTER — Ambulatory Visit: Admitting: Cardiology

## 2025-02-26 ENCOUNTER — Ambulatory Visit: Admitting: Cardiology

## 2025-03-26 ENCOUNTER — Ambulatory Visit: Admitting: Family Medicine

## 2025-09-13 ENCOUNTER — Ambulatory Visit
